# Patient Record
Sex: Female | Born: 1945 | Race: White | Hispanic: No | Marital: Married | Smoking: Former smoker
Health system: Southern US, Community
[De-identification: ages and names within clinical notes are randomized; demographics above are authoritative.]

## PROBLEM LIST (undated history)

## (undated) DIAGNOSIS — C801 Malignant (primary) neoplasm, unspecified: Secondary | ICD-10-CM

## (undated) DIAGNOSIS — F32A Depression, unspecified: Secondary | ICD-10-CM

## (undated) DIAGNOSIS — I2510 Atherosclerotic heart disease of native coronary artery without angina pectoris: Secondary | ICD-10-CM

## (undated) DIAGNOSIS — I10 Essential (primary) hypertension: Secondary | ICD-10-CM

## (undated) DIAGNOSIS — F329 Major depressive disorder, single episode, unspecified: Secondary | ICD-10-CM

## (undated) DIAGNOSIS — M810 Age-related osteoporosis without current pathological fracture: Secondary | ICD-10-CM

## (undated) DIAGNOSIS — I509 Heart failure, unspecified: Secondary | ICD-10-CM

## (undated) DIAGNOSIS — E785 Hyperlipidemia, unspecified: Secondary | ICD-10-CM

## (undated) DIAGNOSIS — E119 Type 2 diabetes mellitus without complications: Secondary | ICD-10-CM

## (undated) DIAGNOSIS — I219 Acute myocardial infarction, unspecified: Secondary | ICD-10-CM

## (undated) DIAGNOSIS — J329 Chronic sinusitis, unspecified: Secondary | ICD-10-CM

## (undated) DIAGNOSIS — C3490 Malignant neoplasm of unspecified part of unspecified bronchus or lung: Secondary | ICD-10-CM

## (undated) DIAGNOSIS — R569 Unspecified convulsions: Secondary | ICD-10-CM

## (undated) HISTORY — DX: Atherosclerotic heart disease of native coronary artery without angina pectoris: I25.10

## (undated) HISTORY — PX: ABDOMINAL HYSTERECTOMY: SHX81

## (undated) HISTORY — DX: Age-related osteoporosis without current pathological fracture: M81.0

## (undated) HISTORY — DX: Essential (primary) hypertension: I10

## (undated) HISTORY — DX: Unspecified convulsions: R56.9

## (undated) HISTORY — PX: UMBILICAL HERNIA REPAIR: SHX196

## (undated) HISTORY — PX: APPENDECTOMY: SHX54

## (undated) HISTORY — DX: Hyperlipidemia, unspecified: E78.5

## (undated) HISTORY — PX: TUBAL LIGATION: SHX77

## (undated) HISTORY — DX: Type 2 diabetes mellitus without complications: E11.9

## (undated) HISTORY — DX: Major depressive disorder, single episode, unspecified: F32.9

## (undated) HISTORY — DX: Malignant (primary) neoplasm, unspecified: C80.1

## (undated) HISTORY — PX: CARPAL TUNNEL RELEASE: SHX101

## (undated) HISTORY — DX: Depression, unspecified: F32.A

## (undated) HISTORY — DX: Chronic sinusitis, unspecified: J32.9

## (undated) HISTORY — PX: OTHER SURGICAL HISTORY: SHX169

## (undated) MED FILL — Dexamethasone Sodium Phosphate Inj 100 MG/10ML: INTRAMUSCULAR | Qty: 1 | Status: AC

---

## 1999-08-05 ENCOUNTER — Encounter: Admission: RE | Admit: 1999-08-05 | Discharge: 1999-08-05 | Payer: Self-pay | Admitting: Obstetrics and Gynecology

## 1999-08-05 ENCOUNTER — Encounter: Payer: Self-pay | Admitting: Obstetrics and Gynecology

## 2000-08-29 ENCOUNTER — Encounter: Admission: RE | Admit: 2000-08-29 | Discharge: 2000-08-29 | Payer: Self-pay | Admitting: Internal Medicine

## 2000-08-29 ENCOUNTER — Encounter: Payer: Self-pay | Admitting: Internal Medicine

## 2000-09-05 ENCOUNTER — Encounter: Payer: Self-pay | Admitting: Internal Medicine

## 2000-09-05 ENCOUNTER — Encounter: Admission: RE | Admit: 2000-09-05 | Discharge: 2000-09-05 | Payer: Self-pay | Admitting: Internal Medicine

## 2002-10-02 ENCOUNTER — Encounter: Payer: Self-pay | Admitting: Emergency Medicine

## 2002-10-02 ENCOUNTER — Inpatient Hospital Stay (HOSPITAL_COMMUNITY): Admission: EM | Admit: 2002-10-02 | Discharge: 2002-10-10 | Payer: Self-pay | Admitting: Emergency Medicine

## 2002-10-02 ENCOUNTER — Encounter (INDEPENDENT_AMBULATORY_CARE_PROVIDER_SITE_OTHER): Payer: Self-pay | Admitting: *Deleted

## 2002-10-02 ENCOUNTER — Encounter: Payer: Self-pay | Admitting: Pulmonary Disease

## 2002-10-02 HISTORY — PX: CORONARY ANGIOPLASTY WITH STENT PLACEMENT: SHX49

## 2002-10-03 ENCOUNTER — Encounter: Payer: Self-pay | Admitting: Pulmonary Disease

## 2002-10-04 ENCOUNTER — Encounter: Payer: Self-pay | Admitting: *Deleted

## 2002-10-06 ENCOUNTER — Encounter: Payer: Self-pay | Admitting: *Deleted

## 2002-10-08 ENCOUNTER — Encounter (INDEPENDENT_AMBULATORY_CARE_PROVIDER_SITE_OTHER): Payer: Self-pay | Admitting: *Deleted

## 2002-11-12 ENCOUNTER — Encounter (HOSPITAL_COMMUNITY): Admission: RE | Admit: 2002-11-12 | Discharge: 2003-02-10 | Payer: Self-pay | Admitting: Cardiovascular Disease

## 2005-12-17 ENCOUNTER — Emergency Department (HOSPITAL_COMMUNITY): Admission: EM | Admit: 2005-12-17 | Discharge: 2005-12-17 | Payer: Self-pay | Admitting: Emergency Medicine

## 2006-09-13 ENCOUNTER — Encounter: Admission: RE | Admit: 2006-09-13 | Discharge: 2006-09-13 | Payer: Self-pay | Admitting: *Deleted

## 2006-09-22 ENCOUNTER — Ambulatory Visit (HOSPITAL_COMMUNITY): Admission: RE | Admit: 2006-09-22 | Discharge: 2006-09-22 | Payer: Self-pay | Admitting: *Deleted

## 2006-09-22 ENCOUNTER — Encounter (INDEPENDENT_AMBULATORY_CARE_PROVIDER_SITE_OTHER): Payer: Self-pay | Admitting: Specialist

## 2007-02-16 ENCOUNTER — Encounter: Admission: RE | Admit: 2007-02-16 | Discharge: 2007-02-16 | Payer: Self-pay | Admitting: *Deleted

## 2009-04-15 ENCOUNTER — Other Ambulatory Visit: Admission: RE | Admit: 2009-04-15 | Discharge: 2009-04-15 | Payer: Self-pay | Admitting: Internal Medicine

## 2009-05-23 ENCOUNTER — Encounter: Admission: RE | Admit: 2009-05-23 | Discharge: 2009-05-23 | Payer: Self-pay | Admitting: General Surgery

## 2010-09-30 ENCOUNTER — Emergency Department (HOSPITAL_COMMUNITY)
Admission: EM | Admit: 2010-09-30 | Discharge: 2010-09-30 | Payer: Self-pay | Source: Home / Self Care | Admitting: Emergency Medicine

## 2011-02-12 ENCOUNTER — Emergency Department (HOSPITAL_COMMUNITY)
Admission: EM | Admit: 2011-02-12 | Discharge: 2011-02-12 | Disposition: A | Discharge status: Home / Self Care | Payer: Worker's Compensation | Attending: Emergency Medicine | Admitting: Emergency Medicine

## 2011-02-12 DIAGNOSIS — I252 Old myocardial infarction: Secondary | ICD-10-CM | POA: Insufficient documentation

## 2011-02-12 DIAGNOSIS — Y9289 Other specified places as the place of occurrence of the external cause: Secondary | ICD-10-CM | POA: Insufficient documentation

## 2011-02-12 DIAGNOSIS — I509 Heart failure, unspecified: Secondary | ICD-10-CM | POA: Insufficient documentation

## 2011-02-12 DIAGNOSIS — S61409A Unspecified open wound of unspecified hand, initial encounter: Secondary | ICD-10-CM | POA: Insufficient documentation

## 2011-02-12 DIAGNOSIS — I2510 Atherosclerotic heart disease of native coronary artery without angina pectoris: Secondary | ICD-10-CM | POA: Insufficient documentation

## 2011-02-12 DIAGNOSIS — W269XXA Contact with unspecified sharp object(s), initial encounter: Secondary | ICD-10-CM | POA: Insufficient documentation

## 2011-02-12 DIAGNOSIS — E119 Type 2 diabetes mellitus without complications: Secondary | ICD-10-CM | POA: Insufficient documentation

## 2011-02-12 DIAGNOSIS — M79609 Pain in unspecified limb: Secondary | ICD-10-CM | POA: Insufficient documentation

## 2011-02-15 ENCOUNTER — Emergency Department (HOSPITAL_COMMUNITY)
Admission: EM | Admit: 2011-02-15 | Discharge: 2011-02-15 | Discharge status: Against Medical Advice | Payer: Worker's Compensation | Attending: Emergency Medicine | Admitting: Emergency Medicine

## 2011-02-15 DIAGNOSIS — Z0389 Encounter for observation for other suspected diseases and conditions ruled out: Secondary | ICD-10-CM | POA: Insufficient documentation

## 2011-02-26 NOTE — Op Note (Signed)
[name], [name]               ACCOUNT NO.:  [id number]   MEDICAL RECORD NO.:  [id number]          PATIENT TYPE:  AMB   LOCATION:  ENDO                         FACILITY:  [facility]   PHYSICIAN:  Georgiana Spinner, M.D.    DATE OF BIRTH:  10-08-1946   DATE OF PROCEDURE:  09/22/2006  DATE OF DISCHARGE:                               OPERATIVE REPORT   PROCEDURE:  Upper endoscopy.   INDICATIONS:  Abdominal pain.   ANESTHESIA:  Fentanyl 75 mcg, Versed 6 mg.   DESCRIPTION OF PROCEDURE:  With the patient mildly sedated in the left  lateral decubitus position, the Olympus videoscopic endoscope was  inserted in the mouth and passed under direct vision through the  esophagus which appeared normal with a small hiatal hernia which was  photographed from above.  We entered into the stomach.  The fundus and  body appeared normal.  The antrum showed thickened folds in the  prepyloric area that were photographed and biopsied.  We entered into  the duodenal bulb and second portion of the duodenum, both appeared  normal.  From this point, the endoscope was slowly withdrawn taking  circumferential views of duodenal mucosa until the endoscope had been  pulled back in the stomach and placed in retroflexion to view the  stomach from below. The endoscope was straightened and withdrawn taking  circumferential views of the remaining gastric and esophageal mucosa.  The patient's vital signs and pulse oximeter remained stable.  She  tolerated the procedure well without apparent complications.   FINDINGS:  Thickening gastric antral fold, await biopsy report.  The  patient will call me for results and follow-up with me as an outpatient  to review ultrasound and other laboratory studies, as well.           ______________________________  Georgiana Spinner, M.D.     GMO/MEDQ  D:  09/22/2006  T:  09/22/2006  Job:  [id number]

## 2011-02-26 NOTE — Cardiovascular Report (Signed)
NAME:  Monica [name], Monica [name]                         ACCOUNT NO.:  [id number]   MEDICAL RECORD NO.:  [id number]                   PATIENT TYPE:  INP   LOCATION:  1827                                 FACILITY:  [facility]   PHYSICIAN:  Nanetta Batty, M.D.                DATE OF BIRTH:  Feb 04, 1946   DATE OF PROCEDURE:  10/02/2002  DATE OF DISCHARGE:                              CARDIAC CATHETERIZATION   PROCEDURE PERFORMED:  Cardiac catheterization.   INDICATION:  The patient is a 65 year old white female who presented to  [facility] ER at 4 a.m. with chest pain.  She was found to be short of  breath and her chest x-ray ultimately showed congestive heart failure and  pulmonary edema.  Her enzymes came back positive and her ECG showed anterior  ST segment elevation. Because of this, she presented urgently to the  catheterization lab for catheterization and potential intervention.   DESCRIPTION OF PROCEDURE:  The patient was brought to the second floor [facility] urgently in cardiogenic shock.  Her  right groin was prepped and shaved in the usual sterile fashion.  Xylocaine  1% was used for local anesthesia.  A 7 French sheath was inserted into the  right femoral artery using standard Seldinger technique. An 8 French sheath  was inserted into the right femoral vein. The 6 French right and left  Judkins diagnostic catheters as well as a pigtail catheter were used for  selective coronary angiography, left ventriculography, supravalvular  aortography and distal abdominal aortography. Omnipaque dye was used for the  entirety of the case.  Retrograde, aortic, left ventricular and pullback  pressures were recorded.   HEMODYNAMICS:  1. Aortic systolic pressure 86, diastolic pressure 61.  2. Left ventricular systolic pressure 85, end-diastolic pressure 28.  3. RA pressure was 14, RV pressure was 47/12, PA pressure was 46/24 and A     wave 31, P wave 33, mean  27.   SELECTIVE CORONARY ANGIOGRAPHY:  1. Left main:  Normal.  2. LAD:  The LAD gave off a first and large second diagonal branch.  There     was continuation of the LAD after the diagonal branch that reached the     apex. There was a 60% hazy stenosis just after the second diagonal     takeoff.  3. Left circumflex:  Nondominant with 40% segmental stenosis.  4. Right coronary artery:  Dominant with anterior takeoff and 40% segmental     stenosis.   LEFT VENTRICULOGRAPHY:  The RAO left ventriculogram was performed using 20  cc of Omnipaque dye at 10 cc/sec.  The overall LVEF was estimated at  approximately 35% with severe anterior and mid inferior hypokinesia.   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  There  is no AI noted.  DISTAL ABDOMINAL AORTOGRAPHY:  This was performed using 20 cc of Omnipaque  dye at 20 cc/sec.  This was performed to introduce an intra-aortic balloon  pump atraumatically. The renal arteries are widely patent. Infrarenal  abdominal aorta had mild atherosclerotic changes but not with non aneurysmal  iliac bifurcation was free of significant disease.   IMPRESSION:  The patient has a left ventricular dysfunction out of  proportion of her coronary artery disease.  I suspect she has two paucities  going on; one a cardiomyopathy, possibly myocarditis, the second anterior  wall myocardial infarction.  I am going to perform intravascular ultrasound-  guided percutaneous coronary intervention from the left anterior descending.   PERCUTANEOUS CORONARY INTERVENTION:  Using a 7 [place] JL4 guide catheter  along with an 0.014, 190 Sport guide wire, IVUS was performed using the  Galaxy II IVUS machine.  This showed occlusive plaque in the mid LAD up to  the bifurcation of the diagonal branch.  Prior to this, the patient received  3000 units of heparin with an ACT of 274.  She  received four baby aspirin  chewable and 300 mg of p.o. Plavix followed by Integrilin double bolus and  infusion.   PTA was performed of the mid LAD with a 2.0 x 20 Maverick.  There was some  spasm of the LAD beyond this. A 2.5 x 23 Cypher stent was then deployed at  12 atmospheres and Low pressure  inflations were performed distal to this  because of no reflow resulting in TIMI-3 flow of the LAD.   The patient was stating at 80% and had a pO2 of 45 on 10 liters.  Because of  this she was urgently intubated which resulted in markedly improved  saturations and blood pressure.   Because of her mitral regurgitation and her low blood pressure in the  setting of an acute myocardial infarction, an intra-aortic balloon pump was  placed via the right femoral artery with excellent augmented pressure.  Because of this, the patient was begun on IV nitroglycerin.  A Foley was  inserted and the patient received 80 mg of IV Lasix as well as Versed and  Pavulon.   OVERALL IMPRESSION:  Successful intravascular ultrasound-guided percutaneous  coronary intervention using glycoprotein IIb/IIIa inhibition of the mid left  anterior descending.  The patient's pressure and saturations markedly  improved after intubation and insertion of intra-aortic balloon pump.   Plans will be for continued artificial ventilation and intra-aortic balloon  pump to unload her ventricle and decrease her MR.  Her clinical situation  still remains guarded.                                                   Nanetta Batty, M.D.    Cordelia Pen  D:  10/02/2002  T:  10/04/2002  Job:  [id number]   cc:   Cardiac Catheterization Laboratory   Dani Gobble, M.D.  [address]  [place]  [place] [address]  Fax: [phone]

## 2011-02-26 NOTE — H&P (Signed)
NAME:  Monica [name], Monica [name]                         ACCOUNT NO.:  [id number]   MEDICAL RECORD NO.:  [id number]                   PATIENT TYPE:  INP   LOCATION:  2926                                 FACILITY:  [facility]   PHYSICIAN:  Nanetta Batty, M.D.                DATE OF BIRTH:  1946/08/05   DATE OF ADMISSION:  10/02/2002  DATE OF DISCHARGE:                                HISTORY & PHYSICAL   CHIEF COMPLAINT:  Chest pain and shortness of breath.   HISTORY OF PRESENT ILLNESS:  The patient is a 65 year old female with no  prior history of coronary disease or myocardial infarction who was admitted  to the emergency room  with increasing shortness of breath, chest pain and  orthopnea.  She complained of sinusitis for one week.  Yesterday, she was  put on Augmentin and after her first dose she developed substernal chest  pain with some diaphoresis.  Yesterday evening, she had increasing shortness  of breath  and  orthopnea and presented to the emergency room.  In the  emergency room, her EKG is abnormal with anterior ST elevation and elevated  CK-MB and troponin.  She is admitted now for  further evaluation.   PAST MEDICAL HISTORY:  Remarkable for hypertension, but no treatment.  She  has no history of diabetes.   CURRENT MEDICATIONS:  1. Premarin 0.625 q.d.  2. Effexor.   ALLERGIES:  KEFLEX.   SOCIAL HISTORY:  She is married with three children, one grandchild.  She is  a one to two pack a day smoker for 36 years. She works at the [employer]  on [address] in [place].   FAMILY HISTORY:  Her father died in his 46s of an myocardial infarction.  Mother died at 69.   REVIEW OF SYSTEMS:  Essentially unremarkable except for noted above.   PHYSICAL EXAMINATION:  VITAL SIGNS:  Blood pressure 100/56, pulse 96,  respirations 20, O2 saturation is 91% on two liters.  GENERAL:  She is a well-developed, well-nourished female in no acute  distress.  HEENT:  Normocephalic.  She does wear  glasses.  Extraocular movements are  intact.  Sclerae nonicteric.  NECK:  Without bruits.  She does have elevated jugular venous distention.  CHEST:  Reveals rales half way up bilaterally.  CARDIAC:  Regular rate and rhythm without murmur, rub or gallop.  Normal S1,  S2.  ABDOMEN:  Nontender.  No hepatosplenomegaly.  EXTREMITIES:  Without edema. Distal pulses are diminished.  There are no  femoral bruits noted.  NEURO:  Grossly intact.   LABORATORY DATA:  Sodium 125, potassium 4.0, BUN 11, creatinine 0.5, BNP  295.  Chest x-ray shows cardiomegaly and congestive heart failure and  pneumonitis.  White count 15.4, hematoglobin 13.7, hematocrit 39.3,  platelets 259.  CK is 533, MB 83.4.  Troponin 6.18.  D-dimer 0.42.  BNP 295.   IMPRESSION:  1.  Acute myocardial infarction.  2. Congestive failure.  3. Upper respiratory infection.  4. History of sinusitis.  5. Smoking.  6. Family history of coronary disease.   PLAN:  The patient was seen by Dr. Domingo Sep and myself and admitted and  started on heparin and taken to the cath  lab urgently by Dr. Allyson Sabal.  EKG  did reveal ST elevation in V2 and V3.      Abelino Derrick, P.A.                      Nanetta Batty, M.D.    Lenard Lance  D:  10/03/2002  T:  10/04/2002  Job:  [id number]   cc:   Janae Bridgeman. Eloise Harman., M.D.  [address]  [place]  [place] [address]  Fax: [phone]

## 2011-02-26 NOTE — Discharge Summary (Signed)
NAME:  ARMANDINA, Monica                         ACCOUNT NO.:  [id number]   MEDICAL RECORD NO.:  [id number]                   PATIENT TYPE:  INP   LOCATION:  2007                                 FACILITY:  [facility]   PHYSICIAN:  Nanetta Batty, M.D.                DATE OF BIRTH:  02/22/1946   DATE OF ADMISSION:  10/02/2002  DATE OF DISCHARGE:  10/10/2002                                 DISCHARGE SUMMARY   HOSPITAL COURSE:  The patient is a 65 year old white female patient with no  prior history of coronary artery disease or MI.  She was admitted to the ER  with increased shortness of breath, chest pain, and orthopnea.  She  apparently had sinusitis about two weeks being treated with antibiotics.  However, she developed some PND and chest pain rated a 6/10.  Her EKG showed  ST elevation anteriorly and her CK and troponins were positive.  She was  seen by Dr. Domingo Sep who felt that she was in cardiogenic shock and it was  decided to take her to the catheterization laboratory urgently on October 02, 2002.  This was performed by Dr. Nanetta Batty.  Catheterization was  done.  It showed a 60% in her LAD.  She underwent IV ultrasound of that and  it showed occlusive plaque back up to her diagonal.  Thus, she developed  some respiratory distress during the procedure and she had to be intubated.  A Cypher stent was placed to her LAD.  She also needed an IAPB with a blood  pressure in the 90s.  She had 2-3+ MR.  She stabilized after she was put on  a respirator and also the IAPB was placed.  It recommended to keep this for  48 hours.  Pulmonary consult was called for ventilation management.  She was  seen on October 02, 2002 by Dr. Sung Amabile.  On October 03, 2002 her blood  pressure was alternating from 100 to 110 systolic.  She was given some IV  Lasix for diuresis.  She had been started on Coreg.  She was getting  breathing treatments.  She was still intubated.  On October 04, 2002 her  ICB  was changed to 1 to 2.  She continued to get some IV Lasix for CHF.  Ventilator was continued.  She was then weaned to 1 to 3 on the IAPB on  October 05, 2002 with a final discontinuation of the IAPB on October 06, 2002.  She also was weaned off the ventilator on October 06, 2002.  Chest x-  ray showed decreased edema on December 28.  She was stable.  Her blood  pressure was up to 105/45.  She continued to have some diuresis.  Her  cardiac medications were titrated upward.  A 2-D echo was done on October 08, 2002 which showed some increase in her ejection fraction; it went from  previous 20-30%, it was not 35-45%.  Anterior walls appeared hypokinetic as  well as inferior wall.  Thus she had already had some recovery of her EF.  On October 10, 2002 it was decided that she was stable on her medications  and she could be discharged home with continued outpatient titration of her  cardiac medications.   LABORATORY DATA:  On October 10, 2002, hemoglobin 13.9, hematocrit 40.7,  platelets 313, wbc's 6.9.  ESR was 99 on December 26.  Sodium 130, potassium  4.0, BUN 9, creatinine 0.5  CK-MB #1 533/83, troponin 6.18; #2 372/49; #3  486/41; #4 461/33.  BNP on December 23 is 295; on the night of December 24  it was 609.  TSH 0.813.  Influenza A and B was negative.  AST 49, ALT 39.  Chest x-ray on October 02, 2002 showed diffuse pulmonary edema; December 24  - slightly less pulmonary edema; December 26 - no change; December 27 -  decreasing pulmonary edema.   DISCHARGE MEDICATIONS:  1. Plavix 75 mg once per day x3 months.  2. Protonix 40 mg once per day or Prevacid.  3. Coreg 3.125 mg once per day.  4. Augmentin 250 mg three times per day x2 more days.  5. Aspirin 81 mg once per day.  6. Altace 2.5 mg once per day.  7. Zocor 20 mg at bedtime every night.  8. Digoxin 0.125 mg once per day.  9. Nitroglycerin 1/150 one under tongue.  10.      She may take her Zyrtec, Flonase, Effexor.  11.       She is not to take her Premarin, lisinopril, or     hydrochlorothiazide.   ACTIVITY:  She should do no strenuous activity, housecleaning, lifting, or  driving until seen back in the office.   DIET:  She should be on a low saturated fat diet.   FOLLOW-UP:  She will follow-up with Dr. Allyson Sabal on October 17, 2002 at 11 a.m.   DISCHARGE DIAGNOSES:  1. Acute anterior wall myocardial infarction with concomitant cardiogenic     shock and urgent cardiac catheterization.  2. Status post catheterization with Cypher stent placed to her left anterior     descending coronary artery.  She also had a residual disease with a 40%     mid right coronary artery and 40% mid circumflex.  Ejection fraction was     20-30% at time of catheterization.  3. Ischemic cardiomyopathy with some recovery status post 2-D echocardiogram     on October 08, 2002 showing ejection fraction of 35-45%.  4. Hypertension.  5. Dyslipidemia.  6. Tobacco.  7. Congestive heart failure.  8. History of family history of coronary artery disease.  9. Sinusitis and upper respiratory infection.     Lezlie Octave, N.P.                        Nanetta Batty, M.D.    BB/MEDQ  D:  11/26/2002  T:  11/26/2002  Job:  [id number]   cc:   Janae Bridgeman. Eloise Harman., M.D.  [address]  [place]  [place] [address]  Fax: [phone]

## 2011-02-26 NOTE — Op Note (Signed)
NAMEPENDA, Monica [name]               ACCOUNT NO.:  [id number]   MEDICAL RECORD NO.:  [id number]          PATIENT TYPE:  AMB   LOCATION:  ENDO                         FACILITY:  [facility]   PHYSICIAN:  Georgiana Spinner, M.D.    DATE OF BIRTH:  May 23, 1946   DATE OF PROCEDURE:  09/22/2006  DATE OF DISCHARGE:                               OPERATIVE REPORT   PROCEDURE:  Colonoscopy.   INDICATIONS:  Colon polyps.   ANESTHESIA:  Fentanyl 25 mcg, Versed 4 mg.   DESCRIPTION OF PROCEDURE:  With the patient mildly sedated in the left  lateral decubitus position, the Pentax videoscopic colonoscope was  inserted into the rectum and passed under direct vision to the cecum  identified by the ileocecal valve and appendiceal orifice, both of which  were photographed. From this point, the colonoscope was slowly withdrawn  taking circumferential views of the colonic mucosa stopping at  approximately 20 cm from the anal verge at which point we encountered  two small polyps which were photographed and removed using hot biopsy  forceps technique at a setting of 20/200 blended current.  We withdrew  all the way into the rectum which appeared normal on indirect and  retroflexed view. The endoscope was straightened and withdrawn.  The  patient's vital signs and pulse oximeter remained stable.  The patient  tolerated the procedure well without apparent complications.   FINDINGS:  Two small polyps at 20 cm from anal verge, otherwise,  unremarkable examination.   PLAN:  Await biopsy report.  The patient will call me for results and  follow-up with me as an outpatient.  See endoscopy note for further  details of follow-up.           ______________________________  Georgiana Spinner, M.D.     GMO/MEDQ  D:  09/22/2006  T:  09/22/2006  Job:  [id number]

## 2011-06-17 ENCOUNTER — Other Ambulatory Visit: Payer: Self-pay | Admitting: Internal Medicine

## 2011-06-17 DIAGNOSIS — R945 Abnormal results of liver function studies: Secondary | ICD-10-CM

## 2011-06-21 ENCOUNTER — Ambulatory Visit
Admission: RE | Admit: 2011-06-21 | Discharge: 2011-06-21 | Disposition: A | Discharge status: Home / Self Care | Payer: Worker's Compensation | Source: Ambulatory Visit | Attending: Internal Medicine | Admitting: Internal Medicine

## 2011-06-21 DIAGNOSIS — R945 Abnormal results of liver function studies: Secondary | ICD-10-CM

## 2015-03-04 ENCOUNTER — Telehealth: Payer: Self-pay | Admitting: Cardiology

## 2015-03-04 NOTE — Telephone Encounter (Signed)
Received records from [facility] for appointment on 03/12/15 with Dr Percival Spanish.  Records given to [facility] (medical records) for Dr Hochrein's schedule on 03/12/15. lp

## 2015-03-12 ENCOUNTER — Ambulatory Visit (INDEPENDENT_AMBULATORY_CARE_PROVIDER_SITE_OTHER): Payer: Medicare Other | Admitting: Cardiology

## 2015-03-12 ENCOUNTER — Encounter: Payer: Self-pay | Admitting: Cardiology

## 2015-03-12 VITALS — BP 132/78 | HR 56 | Ht 65.0 in | Wt 125.0 lb

## 2015-03-12 DIAGNOSIS — I2510 Atherosclerotic heart disease of native coronary artery without angina pectoris: Secondary | ICD-10-CM | POA: Diagnosis not present

## 2015-03-12 NOTE — Patient Instructions (Signed)
Your physician recommends that you schedule a follow-up appointment in: one year with Dr. Percival Spanish    Stop taking  Zetia and fenofibrate  We are ordering a stress test and an Echo for you to get done

## 2015-03-12 NOTE — Progress Notes (Signed)
Cardiology Office Note   Date:  03/12/2015   ID:  [name], DOB 1946/09/23, MRN [id number]  PCP:  Jani Gravel, MD  Cardiologist:   Minus Breeding, MD   Chief Complaint  Patient presents with  . Coronary Artery Disease      History of Present Illness: Monica [name] is a 69 y.o. female who presents for evaluation of coronary artery disease. She had previous stenting to an LAD. This was in 2003. She had a cipher stent. Her EF was 35% at that time and she required treatment with ventilation and intra-aortic balloon pump.  She hasn't been seen in follow-up for about 6 years. She's actually gotten along well but was sent back here for routine follow-up. The patient denies any new symptoms such as chest discomfort, neck or arm discomfort. There has been no new shortness of breath, PND or orthopnea. There have been no reported palpitations, presyncope or syncope.  She doesn't exercise but she does activity such as vacuuming. With this she brings on no cardiovascular symptoms.   Past Medical History  Diagnosis Date  . Hyperlipidemia   . Hypertension   . Diabetes mellitus   . Sinusitis   . Osteoporosis   . CAD (coronary artery disease)     Cypher stent 09/2002    Past Surgical History  Procedure Laterality Date  . Umbilical hernia repair    . Tubal ligation    . Exploratory abdominal      Bleeding in abdomen after tubal  . Carpal tunnel release Bilateral      Current Outpatient Prescriptions  Medication Sig Dispense Refill  . aspirin 81 MG tablet Take 81 mg by mouth daily.    Marland Kitchen atorvastatin (LIPITOR) 10 MG tablet Take 40 mg by mouth daily.    . Calcium Carbonate-Vitamin D 600-400 MG-UNIT per tablet Take 1 tablet by mouth 2 (two) times daily.    . carvedilol (COREG) 3.125 MG tablet Take 3.125 mg by mouth 2 (two) times daily with a meal.    . halobetasol (ULTRAVATE) 0.05 % cream Apply topically daily.    . Insulin Detemir (LEVEMIR) 100 UNIT/ML Pen Inject 16 Units into  the skin daily at 10 pm.    . losartan (COZAAR) 50 MG tablet Take 50 mg by mouth daily.    . metFORMIN (GLUCOPHAGE) 500 MG tablet Take by mouth 2 (two) times daily with a meal.    . Multiple Vitamin (MULTIVITAMIN) tablet Take 1 tablet by mouth daily.    . Omega-3 Fatty Acids (FISH OIL) 1000 MG CAPS Take 1,000 mg by mouth daily.    Marland Kitchen omeprazole (PRILOSEC) 20 MG capsule Take 20 mg by mouth daily.    . pioglitazone (ACTOS) 15 MG tablet Take 15 mg by mouth daily.     No current facility-administered medications for this visit.    Allergies:   Ace inhibitors; Altace; Keflex; Phenobarbital; and Sulfate    Social History:  The patient  reports that she has quit smoking. Her smoking use included Cigarettes. She has a 60 pack-year smoking history. She uses smokeless tobacco. She reports that she does not drink alcohol.   Family History:  The patient's family history includes Dementia in her mother; Heart attack (age of onset: 58) in her father; Heart attack (age of onset: 45) in her brother and brother; Kidney disease in her mother.    ROS:  Please see the history of present illness.   Otherwise, review of systems are positive for  none.   All other systems are reviewed and negative.    PHYSICAL EXAM: VS:  BP 132/78 mmHg  Pulse 56  Ht '5\' 5"'$  (1.651 m)  Wt 125 lb (56.7 kg)  BMI 20.80 kg/m2 , BMI Body mass index is 20.8 kg/(m^2). GENERAL:  Well appearing HEENT:  Pupils equal round and reactive, fundi not visualized, oral mucosa unremarkable NECK:  No jugular venous distention, waveform within normal limits, carotid upstroke brisk and symmetric, no bruits, no thyromegaly LYMPHATICS:  No cervical, inguinal adenopathy LUNGS:  Clear to auscultation bilaterally BACK:  No CVA tenderness CHEST:  Unremarkable HEART:  PMI not displaced or sustained,S1 and S2 within normal limits, no S3, no S4, no clicks, no rubs, no murmurs ABD:  Flat, positive bowel sounds normal in frequency in pitch, no bruits, no  rebound, no guarding, no midline pulsatile mass, no hepatomegaly, no splenomegaly EXT:  2 plus pulses throughout, no edema, no cyanosis no clubbing SKIN:  No rashes no nodules NEURO:  Cranial nerves II through XII grossly intact, motor grossly intact throughout PSYCH:  Cognitively intact, oriented to person place and time    EKG:  EKG is ordered today. The ekg ordered today demonstrates  Sinus rhythm, rate 56, axis within normal limits, intervals within normal limits, no acute ST-T wave changes.    Recent Labs: No results found for requested labs within last 365 days.    Lipid Panel LDL 9, HDL 28 triglycerides 228 A1c 6.3    Wt Readings from Last 3 Encounters:  03/12/15 125 lb (56.7 kg)      Other studies Reviewed: Additional studies/ records that were reviewed today include: Primary office records including labs. Review of the above records demonstrates:  Please see elsewhere in the note.     ASSESSMENT AND PLAN:  CAD:  The patient has no new symptoms. I would like to screen her with a POET (Plain Old Exercise Treadmill).  She should continue with aggressive secondary risk reduction.  DYSLIPIDEMIA:  Recent FDA guidelines have removed any approval for combination fenofibric acid and statin when used in combination to treat low HDL or hypertriglyceridemia without another indication such as pancreatitis.  Therefore, I will stop the Tricor.  In addition her LDL does not need to be as low as it is so I don't think there is any indication for the Zetia and I have taken the liberty of stopping this.   Current medicines are reviewed at length with the patient today.  The patient does not have concerns regarding medicines.  The following changes have been made:  As above  Labs/ tests ordered today include:   Orders Placed This Encounter  Procedures  . Exercise Tolerance Test  . EKG 12-Lead  . ECHOCARDIOGRAM COMPLETE     Disposition:   FU with me in one year.      Signed, Minus Breeding, MD  03/12/2015 11:21 AM    [facility]

## 2015-03-24 ENCOUNTER — Encounter: Payer: Self-pay | Admitting: Cardiology

## 2015-04-09 ENCOUNTER — Telehealth (HOSPITAL_COMMUNITY): Payer: Self-pay

## 2015-04-09 NOTE — Telephone Encounter (Signed)
Encounter complete. 

## 2015-04-11 ENCOUNTER — Ambulatory Visit (HOSPITAL_BASED_OUTPATIENT_CLINIC_OR_DEPARTMENT_OTHER)
Admission: RE | Admit: 2015-04-11 | Discharge: 2015-04-11 | Disposition: A | Discharge status: Home / Self Care | Payer: Medicare Other | Source: Ambulatory Visit | Attending: Cardiology | Admitting: Cardiology

## 2015-04-11 ENCOUNTER — Ambulatory Visit (HOSPITAL_COMMUNITY)
Admission: RE | Admit: 2015-04-11 | Discharge: 2015-04-11 | Disposition: A | Discharge status: Home / Self Care | Payer: Medicare Other | Source: Ambulatory Visit | Attending: Cardiology | Admitting: Cardiology

## 2015-04-11 ENCOUNTER — Encounter (HOSPITAL_COMMUNITY): Payer: Self-pay | Admitting: *Deleted

## 2015-04-11 DIAGNOSIS — I358 Other nonrheumatic aortic valve disorders: Secondary | ICD-10-CM | POA: Insufficient documentation

## 2015-04-11 DIAGNOSIS — I429 Cardiomyopathy, unspecified: Secondary | ICD-10-CM | POA: Diagnosis present

## 2015-04-11 DIAGNOSIS — I340 Nonrheumatic mitral (valve) insufficiency: Secondary | ICD-10-CM | POA: Diagnosis not present

## 2015-04-11 DIAGNOSIS — I2510 Atherosclerotic heart disease of native coronary artery without angina pectoris: Secondary | ICD-10-CM

## 2015-04-11 DIAGNOSIS — I351 Nonrheumatic aortic (valve) insufficiency: Secondary | ICD-10-CM | POA: Diagnosis not present

## 2015-04-11 LAB — EXERCISE TOLERANCE TEST
CHL CUP MPHR: 152 {beats}/min
CHL RATE OF PERCEIVED EXERTION: 16
CSEPED: 8 min
CSEPEDS: 36 s
CSEPEW: 10.1 METS
Peak HR: 122 {beats}/min
Percent HR: 80 %
Rest HR: 57 {beats}/min

## 2015-04-11 NOTE — Progress Notes (Unsigned)
Pt. Stated she was not instructed to hold her Carvedilol, therefore, she was unable to attain THR

## 2015-04-28 ENCOUNTER — Other Ambulatory Visit: Payer: Self-pay | Admitting: Gastroenterology

## 2015-05-27 ENCOUNTER — Telehealth: Payer: Self-pay | Admitting: *Deleted

## 2015-05-27 ENCOUNTER — Encounter (HOSPITAL_COMMUNITY): Payer: Self-pay | Admitting: *Deleted

## 2015-05-27 NOTE — Telephone Encounter (Signed)
[facility]/Dr. Carol Ada requests clearance for:   1. Type of surgery: colonoscopy 2. Date of surgery: 05/30/2015 3. Surgeon: Dr. Benson Norway 4. Medications that need to be held & how long: none specified 5. Fax and/or Phone: (p) [phone]  (f) [phone]

## 2015-05-28 NOTE — Telephone Encounter (Signed)
Encounter routed to Dr. Benson Norway

## 2015-05-28 NOTE — Telephone Encounter (Signed)
The patient can undergo a colonoscopy.

## 2015-05-29 ENCOUNTER — Telehealth: Payer: Self-pay | Admitting: Cardiology

## 2015-05-29 NOTE — H&P (Signed)
  Monica [name] HPI: The patient was evaluated by Dr. Percival Spanish and she underwent testing on 04/11/2015. Her echo revealed an EF of 60-65% and her stress test was negative for anginal complaints or ST changes, however, she was on her beta blocker. Her target heart rate was not reached.  Past Medical History  Diagnosis Date  . Hyperlipidemia   . Hypertension   . Diabetes mellitus   . Sinusitis   . Osteoporosis   . CAD (coronary artery disease)     Cypher stent 09/2002  . CHF (congestive heart failure)   . 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 Umbilical hernia repair    . Tubal ligation    . Exploratory abdominal      Bleeding in abdomen after tubal  . Carpal tunnel release Bilateral   . Coronary angioplasty with stent placement  10-02-2002  . Appendectomy      Family History  Problem Relation Age of Onset  . Dementia Mother   . Kidney disease Mother   . Heart attack Father 64    Died age 39  . Heart attack Brother 29  . Heart attack Brother 64    Social History:  reports that she has been smoking Cigarettes.  She has a 60 pack-year smoking history. She uses smokeless tobacco. She reports that she does not drink alcohol or use illicit drugs.  Allergies:  Allergies  Allergen Reactions  . Ace Inhibitors Dermatitis and Cough  . Altace [Ramipril]   . Keflex [Cephalexin] Itching  . Phenobarbital Other (See Comments)    Black spots on hands and feet.  . Sulfate Nausea And Vomiting and Other (See Comments)    Dizziness     Medications: Scheduled: Continuous:  No results found for this or any previous visit (from the past 24 hour(s)).   No results found.  ROS:  As stated above in the HPI otherwise negative.  There were no vitals taken for this visit.    PE: Gen: NAD, Alert and Oriented HEENT:  [place]/AT, EOMI Neck: Supple, no LAD Lungs: CTA Bilaterally CV: RRR without M/G/R ABM: Soft, NTND,  +BS Ext: No C/C/E  Assessment/Plan: 1) Screening colonoscopy.   She was cleared by Cardiology for the procedure.  Monica [name] D 05/29/2015, 12:10 PM

## 2015-05-29 NOTE — Telephone Encounter (Signed)
Rec'd from [place] forward 4 pages to dr. Percival Spanish

## 2015-05-30 ENCOUNTER — Ambulatory Visit (HOSPITAL_COMMUNITY): Payer: Medicare Other | Admitting: Anesthesiology

## 2015-05-30 ENCOUNTER — Encounter (HOSPITAL_COMMUNITY): Payer: Self-pay | Admitting: *Deleted

## 2015-05-30 ENCOUNTER — Ambulatory Visit (HOSPITAL_COMMUNITY)
Admission: RE | Admit: 2015-05-30 | Discharge: 2015-05-30 | Disposition: A | Discharge status: Home / Self Care | Payer: Medicare Other | Source: Ambulatory Visit | Attending: Gastroenterology | Admitting: Gastroenterology

## 2015-05-30 ENCOUNTER — Encounter (HOSPITAL_COMMUNITY)
Admission: RE | Disposition: A | Discharge status: Home / Self Care | Payer: Self-pay | Source: Ambulatory Visit | Attending: Gastroenterology

## 2015-05-30 DIAGNOSIS — D125 Benign neoplasm of sigmoid colon: Secondary | ICD-10-CM | POA: Insufficient documentation

## 2015-05-30 DIAGNOSIS — Z794 Long term (current) use of insulin: Secondary | ICD-10-CM | POA: Insufficient documentation

## 2015-05-30 DIAGNOSIS — E785 Hyperlipidemia, unspecified: Secondary | ICD-10-CM | POA: Insufficient documentation

## 2015-05-30 DIAGNOSIS — I2510 Atherosclerotic heart disease of native coronary artery without angina pectoris: Secondary | ICD-10-CM | POA: Insufficient documentation

## 2015-05-30 DIAGNOSIS — D123 Benign neoplasm of transverse colon: Secondary | ICD-10-CM | POA: Insufficient documentation

## 2015-05-30 DIAGNOSIS — I252 Old myocardial infarction: Secondary | ICD-10-CM | POA: Diagnosis not present

## 2015-05-30 DIAGNOSIS — I10 Essential (primary) hypertension: Secondary | ICD-10-CM | POA: Diagnosis not present

## 2015-05-30 DIAGNOSIS — D120 Benign neoplasm of cecum: Secondary | ICD-10-CM | POA: Insufficient documentation

## 2015-05-30 DIAGNOSIS — I509 Heart failure, unspecified: Secondary | ICD-10-CM | POA: Insufficient documentation

## 2015-05-30 DIAGNOSIS — E119 Type 2 diabetes mellitus without complications: Secondary | ICD-10-CM | POA: Diagnosis not present

## 2015-05-30 DIAGNOSIS — F17290 Nicotine dependence, other tobacco product, uncomplicated: Secondary | ICD-10-CM | POA: Insufficient documentation

## 2015-05-30 DIAGNOSIS — Z1211 Encounter for screening for malignant neoplasm of colon: Secondary | ICD-10-CM | POA: Insufficient documentation

## 2015-05-30 DIAGNOSIS — Z79899 Other long term (current) drug therapy: Secondary | ICD-10-CM | POA: Diagnosis not present

## 2015-05-30 DIAGNOSIS — K5730 Diverticulosis of large intestine without perforation or abscess without bleeding: Secondary | ICD-10-CM | POA: Diagnosis not present

## 2015-05-30 DIAGNOSIS — D124 Benign neoplasm of descending colon: Secondary | ICD-10-CM | POA: Insufficient documentation

## 2015-05-30 DIAGNOSIS — Z955 Presence of coronary angioplasty implant and graft: Secondary | ICD-10-CM | POA: Diagnosis not present

## 2015-05-30 DIAGNOSIS — F17210 Nicotine dependence, cigarettes, uncomplicated: Secondary | ICD-10-CM | POA: Insufficient documentation

## 2015-05-30 HISTORY — PX: COLONOSCOPY WITH PROPOFOL: SHX5780

## 2015-05-30 HISTORY — DX: Heart failure, unspecified: I50.9

## 2015-05-30 HISTORY — DX: Acute myocardial infarction, unspecified: I21.9

## 2015-05-30 LAB — GLUCOSE, CAPILLARY: GLUCOSE-CAPILLARY: 132 mg/dL — AB (ref 65–99)

## 2015-05-30 SURGERY — COLONOSCOPY WITH PROPOFOL
Anesthesia: Monitor Anesthesia Care

## 2015-05-30 MED ORDER — PROPOFOL 10 MG/ML IV BOLUS
INTRAVENOUS | Status: AC
  Filled 2015-05-30: qty 20

## 2015-05-30 MED ORDER — LACTATED RINGERS IV SOLN
INTRAVENOUS | Status: DC
  Administered 2015-05-30: 1000 mL via INTRAVENOUS

## 2015-05-30 MED ORDER — PROPOFOL 10 MG/ML IV BOLUS
INTRAVENOUS | Status: DC | PRN
  Administered 2015-05-30: 10 mg via INTRAVENOUS
  Administered 2015-05-30: 20 mg via INTRAVENOUS

## 2015-05-30 MED ORDER — PROPOFOL INFUSION 10 MG/ML OPTIME
INTRAVENOUS | Status: DC | PRN
Start: 2015-05-30 — End: 2015-05-30
  Administered 2015-05-30: 250 ug/kg/min via INTRAVENOUS

## 2015-05-30 MED ORDER — LIDOCAINE HCL (CARDIAC) 20 MG/ML IV SOLN
INTRAVENOUS | Status: AC
  Filled 2015-05-30: qty 5

## 2015-05-30 MED ORDER — SODIUM CHLORIDE 0.9 % IV SOLN
INTRAVENOUS | Status: DC
Start: 2015-05-30 — End: 2015-05-30

## 2015-05-30 SURGICAL SUPPLY — 21 items

## 2015-05-30 NOTE — Transfer of Care (Signed)
Immediate Anesthesia Transfer of Care Note  Patient: Monica [name]  Procedure(s) Performed: Procedure(s): COLONOSCOPY WITH PROPOFOL (N/A)  Patient Location: PACU, Endo recovery  Anesthesia Type:MAC  Level of Consciousness: Patient easily awoken, sedated, comfortable, cooperative, following commands, responds to stimulation.   Airway & Oxygen Therapy: Patient spontaneously breathing, ventilating well, oxygen via simple oxygen mask.  Post-op Assessment: Report given to PACU RN, vital signs reviewed and stable, moving all extremities.   Post vital signs: Reviewed and stable.  Complications: No apparent anesthesia complications

## 2015-05-30 NOTE — Op Note (Signed)
[facility] [place], [address]   COLONOSCOPY PROCEDURE REPORT  PATIENT: Monica, [name]  MR#: [id number] BIRTHDATE: 07/12/1946 , 68  yrs. old GENDER: female ENDOSCOPIST: Carol Ada, MD REFERRED BY: PROCEDURE DATE:  June 12, 2015 PROCEDURE:   Colonoscopy with snare polypectomy ASA CLASS:   Class III INDICATIONS: Screening MEDICATIONS: Monitored anesthesia care  DESCRIPTION OF PROCEDURE:   After the risks and benefits and of the procedure were explained, informed consent was obtained.  revealed no abnormalities of the rectum.    The Pentax Ped Colon [id number] endoscope was introduced through the anus and advanced to the cecum, which was identified by both the appendix and ileocecal valve .  The quality of the prep was excellent. .  The instrument was then slowly withdrawn as the colon was fully examined. Estimated blood loss is zero unless otherwise noted in this procedure report.   FINDINGS: A total of 7 polyps were identified int he cecum, transverse colon, descending colon, and sigmoid colon.  All the polyps were removoed with a cold snare.  The largest polyp was found in the cecum measuring 6-7 mm adjacent to the appendiceal orifice.  Scattered sigmoid diverticula were identified. Retroflexed views revealed no abnormalities.     The scope was then withdrawn from the patient and the procedure completed.  WITHDRAWAL TIME: 18 minutes  COMPLICATIONS: There were no immediate complications. ENDOSCOPIC IMPRESSION: 1) Colonic polyps. 2) Diverticula.  RECOMMENDATIONS: 1) Follow up biopsies. 2) Repeat the colonoscopy in 3-5 years.  REPEAT EXAM:  cc:  _______________________________ eSignedCarol Ada, MD June 12, 2015 12:28 PM   CPT CODES: ICD CODES:  The ICD and CPT codes recommended by this software are interpretations from the data that the clinical staff has captured with the software.  The verification of the translation of  this report to the ICD and CPT codes and modifiers is the sole responsibility of the health care institution and practicing physician where this report was generated.  [employer]. will not be held responsible for the validity of the ICD and CPT codes included on this report.  AMA assumes no liability for data contained or not contained herein. CPT is a Designer, television/film set of the [employer].   PATIENT NAME:  Monica, [name] MR#: [id number]

## 2015-05-30 NOTE — Discharge Instructions (Signed)
Colonoscopy, Care After Refer to this sheet in the next few weeks. These instructions provide you with information on caring for yourself after your procedure. Your health care provider may also give you more specific instructions. Your treatment has been planned according to current medical practices, but problems sometimes occur. Call your health care provider if you have any problems or questions after your procedure. WHAT TO EXPECT AFTER THE PROCEDURE  After your procedure, it is typical to have the following:  A small amount of blood in your stool.  Moderate amounts of gas and mild abdominal cramping or bloating. HOME CARE INSTRUCTIONS  Do not drive, operate machinery, or sign important documents for 24 hours.  You may shower and resume your regular physical activities, but move at a slower pace for the first 24 hours.  Take frequent rest periods for the first 24 hours.  Walk around or put a warm pack on your abdomen to help reduce abdominal cramping and bloating.  Drink enough fluids to keep your urine clear or pale yellow.  You may resume your normal diet as instructed by your health care provider. Avoid heavy or fried foods that are hard to digest.  Avoid drinking alcohol for 24 hours or as instructed by your health care provider.  Only take over-the-counter or prescription medicines as directed by your health care provider.  If a tissue sample (biopsy) was taken during your procedure:  Do not take aspirin or blood thinners for 7 days, or as instructed by your health care provider.  Do not drink alcohol for 7 days, or as instructed by your health care provider.  Eat soft foods for the first 24 hours. SEEK MEDICAL CARE IF: You have persistent spotting of blood in your stool 2-3 days after the procedure. SEEK IMMEDIATE MEDICAL CARE IF:  You have more than a small spotting of blood in your stool.  You pass large blood clots in your stool.  Your abdomen is swollen  (distended).  You have nausea or vomiting.  You have a fever.  You have increasing abdominal pain that is not relieved with medicine. Document Released: 05/11/2004 Document Revised: 07/18/2013 Document Reviewed: 06/04/2013 [facility] [employer] [facility] Patient Information 2015 [place], [place]. This information is not intended to replace advice given to you by your health care provider. Make sure you discuss any questions you have with your health care provider.  Monitored Anesthesia Care Monitored anesthesia care is an anesthesia service for a medical procedure. Anesthesia is the loss of the ability to feel pain. It is produced by medicines called anesthetics. It may affect a small area of your body (local anesthesia), a large area of your body (regional anesthesia), or your entire body (general anesthesia). The need for monitored anesthesia care depends your procedure, your condition, and the potential need for regional or general anesthesia. It is often provided during procedures where:   General anesthesia may be needed if there are complications. This is because you need special care when you are under general anesthesia.   You will be under local or regional anesthesia. This is so that you are able to have higher levels of anesthesia if needed.   You will receive calming medicines (sedatives). This is especially the case if sedatives are given to put you in a semi-conscious state of relaxation (deep sedation). This is because the amount of sedative needed to produce this state can be hard to predict. Too much of a sedative can produce general anesthesia. Monitored anesthesia care is performed by one  or more health care providers who have special training in all types of anesthesia. You will need to meet with these health care providers before your procedure. During this meeting, they will ask you about your medical history. They will also give you instructions to follow. (For example, you will need to stop  eating and drinking before your procedure. You may also need to stop or change medicines you are taking.) During your procedure, your health care providers will stay with you. They will:   Watch your condition. This includes watching your blood pressure, breathing, and level of pain.   Diagnose and treat problems that occur.   Give medicines if they are needed. These may include calming medicines (sedatives) and anesthetics.   Make sure you are comfortable.  Having monitored anesthesia care does not necessarily mean that you will be under anesthesia. It does mean that your health care providers will be able to manage anesthesia if you need it or if it occurs. It also means that you will be able to have a different type of anesthesia than you are having if you need it. When your procedure is complete, your health care providers will continue to watch your condition. They will make sure any medicines wear off before you are allowed to go home.  Document Released: 06/23/2005 Document Revised: 02/11/2014 Document Reviewed: 11/08/2012 [facility] Patient Information 2015 [address], [place]. This information is not intended to replace advice given to you by your health care provider. Make sure you discuss any questions you have with your health care provider.

## 2015-05-30 NOTE — Anesthesia Preprocedure Evaluation (Signed)
Anesthesia Evaluation  Patient identified by MRN, date of birth, ID band Patient awake    Reviewed: Allergy & Precautions, NPO status , Patient's Chart, lab work & pertinent test results, reviewed documented beta blocker date and time   Airway Mallampati: II  TM Distance: >3 FB Neck ROM: Full    Dental  (+) Partial Lower, Partial Upper   Pulmonary neg shortness of breath, Current Smoker,  breath sounds clear to auscultation  Pulmonary exam normal       Cardiovascular Exercise Tolerance: Good hypertension, Pt. on medications and Pt. on home beta blockers - angina+ CAD, + Past MI, + Cardiac Stents and +CHF Normal cardiovascular examRhythm:Regular Rate:Normal     Neuro/Psych Seizures -,  1 seizure 20 years ago    GI/Hepatic negative GI ROS, Neg liver ROS,   Endo/Other  diabetes, Well Controlled, Type 2, Insulin Dependent, Oral Hypoglycemic Agents  Renal/GU negative Renal ROS     Musculoskeletal negative musculoskeletal ROS (+)   Abdominal   Peds  Hematology negative hematology ROS (+)   Anesthesia Other Findings Day of surgery medications reviewed with the patient.  Reproductive/Obstetrics                             Anesthesia Physical Anesthesia Plan  ASA: III  Anesthesia Plan: MAC   Post-op Pain Management:    Induction: Intravenous  Airway Management Planned: Nasal Cannula  Additional Equipment:   Intra-op Plan:   Post-operative Plan:   Informed Consent: I have reviewed the patients History and Physical, chart, labs and discussed the procedure including the risks, benefits and alternatives for the proposed anesthesia with the patient or authorized representative who has indicated his/her understanding and acceptance.   Dental advisory given  Plan Discussed with: CRNA and Anesthesiologist  Anesthesia Plan Comments: (Discussed risks/benefits/alternatives to MAC sedation  including need for ventilatory support, hypotension, need for conversion to general anesthesia.  All patient questions answered.  Patient wished to proceed.)        Anesthesia Quick Evaluation

## 2015-05-30 NOTE — Anesthesia Postprocedure Evaluation (Signed)
  Anesthesia Post-op Note  Patient: Monica [name]  Procedure(s) Performed: Procedure(s) (LRB): COLONOSCOPY WITH PROPOFOL (N/A)  Patient Location: PACU  Anesthesia Type: MAC  Level of Consciousness: awake and alert   Airway and Oxygen Therapy: Patient Spontanous Breathing  Post-op Pain: mild  Post-op Assessment: Post-op Vital signs reviewed, Patient's Cardiovascular Status Stable, Respiratory Function Stable, Patent Airway and No signs of Nausea or vomiting  Last Vitals:  Filed Vitals:   05/30/15 1240  BP: 107/63  Pulse: 62  Temp:   Resp: 20    Post-op Vital Signs: stable   Complications: No apparent anesthesia complications

## 2015-06-02 ENCOUNTER — Encounter (HOSPITAL_COMMUNITY): Payer: Self-pay | Admitting: Gastroenterology

## 2015-06-10 ENCOUNTER — Encounter: Payer: Self-pay | Admitting: Cardiology

## 2016-03-14 NOTE — Progress Notes (Signed)
Cardiology Office Note   Date:  03/15/2016   ID:  Monica [name], DOB [birth year], MRN [id number]  PCP:  Jani Gravel, MD  Cardiologist:   Minus Breeding, MD   No chief complaint on file.     History of Present Illness: Monica [name] is a 70 y.o. female who presents for evaluation of coronary artery disease. She had previous stenting to an LAD. This was in 2003. She had a Cipher stent. Her EF was 35% at that time and she required treatment with ventilation and intra-aortic balloon pump.  I saw her for the first time last year after being gone from the practice for six years.  Follow up echo showed an EF of 65%.  POET (Plain Old Exercise Treadmill) was negative.  She returns for follow up.  The patient denies any new symptoms such as chest discomfort, neck or arm discomfort. There has been no new shortness of breath, PND or orthopnea. There have been no reported palpitations, presyncope or syncope.   Past Medical History  Diagnosis Date  . Hyperlipidemia   . Hypertension   . Diabetes mellitus ([place])   . Sinusitis   . Osteoporosis   . CAD (coronary artery disease)     Cypher stent 09/2002  . CHF (congestive heart failure) ([place])   . Myocardial infarction ([place])     2003,went into cardiac shock    Past Surgical History  Procedure Laterality Date  . Umbilical hernia repair    . Tubal ligation    . Exploratory abdominal      Bleeding in abdomen after tubal  . Carpal tunnel release Bilateral   . Coronary angioplasty with stent placement  10-02-2002  . Appendectomy    . Colonoscopy with propofol N/A 05/30/2015    Procedure: COLONOSCOPY WITH PROPOFOL;  Surgeon: Carol Ada, MD;  Location: [facility];  Service: Endoscopy;  Laterality: N/A;     Current Outpatient Prescriptions  Medication Sig Dispense Refill  . acetaminophen (TYLENOL) 500 MG tablet Take 1,000 mg by mouth every 6 (six) hours as needed for moderate pain or headache.    Marland Kitchen aspirin 81 MG tablet Take 81 mg by mouth  daily.    Marland Kitchen atorvastatin (LIPITOR) 10 MG tablet Take 10 mg by mouth daily.     . Calcium Carbonate-Vitamin D 600-400 MG-UNIT per tablet Take 1 tablet by mouth 2 (two) times daily.    . carvedilol (COREG) 3.125 MG tablet Take 3.125 mg by mouth 2 (two) times daily with a meal.    . clonazePAM (KLONOPIN) 1 MG tablet Take 1 mg by mouth daily.    Marland Kitchen glimepiride (AMARYL) 4 MG tablet Take 4 mg by mouth 2 (two) times daily.    . halobetasol (ULTRAVATE) 0.05 % cream Apply 1 application topically daily as needed (rash on leg).     . Insulin Detemir (LEVEMIR) 100 UNIT/ML Pen Inject 30 Units into the skin every morning.     . Insulin Lispro, Human, (HUMALOG KWIKPEN) 200 UNIT/ML SOPN Inject 3-5 Units into the skin 3 (three) times daily. 6 units in the morning, 6 units in the afternoon, 9 units at bedtime    . losartan (COZAAR) 50 MG tablet Take 50 mg by mouth daily.    . metFORMIN (GLUCOPHAGE) 500 MG tablet Take by mouth 2 (two) times daily with a meal.    . Multiple Vitamin (MULTIVITAMIN) tablet Take 1 tablet by mouth daily.    . Omega-3 Fatty Acids (FISH OIL) 1000 MG  CAPS Take 1,000 mg by mouth daily.    Marland Kitchen omeprazole (PRILOSEC) 20 MG capsule Take 20 mg by mouth daily.    . pioglitazone (ACTOS) 15 MG tablet Take 15 mg by mouth daily.    . risedronate (ACTONEL) 150 MG tablet Take 150 mg by mouth every 30 (thirty) days.    Marland Kitchen sertraline (ZOLOFT) 50 MG tablet Take 50 mg by mouth daily.     No current facility-administered medications for this visit.    Allergies:   Ace inhibitors; Altace; Keflex; Phenobarbital; and Sulfate    ROS:  Please see the history of present illness.   Otherwise, review of systems are positive for none.   All other systems are reviewed and negative.    PHYSICAL EXAM: VS:  BP 128/78 mmHg  Pulse 62  Ht '5\' 5"'$  (1.651 m)  Wt 129 lb (58.514 kg)  BMI 21.47 kg/m2  SpO2 98% , BMI Body mass index is 21.47 kg/(m^2). GENERAL:  Well appearing NECK:  No jugular venous distention, waveform  within normal limits, carotid upstroke brisk and symmetric, no bruits, no thyromegaly LUNGS:  Clear to auscultation bilaterally BACK:  No CVA tenderness CHEST:  Unremarkable HEART:  PMI not displaced or sustained,S1 and S2 within normal limits, no S3, no S4, no clicks, no rubs, no murmurs ABD:  Flat, positive bowel sounds normal in frequency in pitch, no bruits, no rebound, no guarding, no midline pulsatile mass, no hepatomegaly, no splenomegaly EXT:  2 plus pulses throughout, no edema, no cyanosis no clubbing    EKG:  EKG is ordered today. The ekg ordered today demonstrates  Sinus rhythm, rate 63, axis within normal limits, intervals within normal limits, no acute ST-T wave changes.    Recent Labs:    Lipid Panel LDL 9, HDL 28 triglycerides 228 A1c 6.3    Wt Readings from Last 3 Encounters:  03/15/16 129 lb (58.514 kg)  05/30/15 123 lb 8 oz (56.019 kg)  03/12/15 125 lb (56.7 kg)      Other studies Reviewed: Additional studies/ records that were reviewed today include: Primary office records including labs. Review of the above records demonstrates:  Please see elsewhere in the note.     ASSESSMENT AND PLAN:  CAD:  The patient has no new sypmtoms.  No further cardiovascular testing is indicated.  We will continue with aggressive risk reduction and meds as listed.  DYSLIPIDEMIA:  She has this followed by Jani Gravel, MD.  The goal should be less than 70 LDL.    TOBACCO:  She is on e cigarettes and she is encouraged to stop using these over time.   Current medicines are reviewed at length with the patient today.  The patient does not have concerns regarding medicines.  The following changes have been made:  None  Labs/ tests ordered today include:   No orders of the defined types were placed in this encounter.     Disposition:   FU with me in one year.     Signed, Minus Breeding, MD  03/15/2016 12:34 PM     [facility]

## 2016-03-15 ENCOUNTER — Ambulatory Visit (INDEPENDENT_AMBULATORY_CARE_PROVIDER_SITE_OTHER): Payer: Medicare Other | Admitting: Cardiology

## 2016-03-15 ENCOUNTER — Encounter: Payer: Self-pay | Admitting: Cardiology

## 2016-03-15 VITALS — BP 128/78 | HR 62 | Ht 65.0 in | Wt 129.0 lb

## 2016-03-15 DIAGNOSIS — I2510 Atherosclerotic heart disease of native coronary artery without angina pectoris: Secondary | ICD-10-CM

## 2016-03-15 NOTE — Patient Instructions (Signed)
Your physician wants you to follow-up in: 1 Year. You will receive a reminder letter in the mail two months in advance. If you don't receive a letter, please call our office to schedule the follow-up appointment.  

## 2016-07-09 ENCOUNTER — Emergency Department (HOSPITAL_COMMUNITY): Payer: Medicare Other

## 2016-07-09 ENCOUNTER — Encounter (HOSPITAL_COMMUNITY): Payer: Self-pay | Admitting: *Deleted

## 2016-07-09 ENCOUNTER — Emergency Department (HOSPITAL_COMMUNITY)
Admission: EM | Admit: 2016-07-09 | Discharge: 2016-07-09 | Disposition: A | Discharge status: Home / Self Care | Payer: Medicare Other | Attending: Emergency Medicine | Admitting: Emergency Medicine

## 2016-07-09 DIAGNOSIS — I110 Hypertensive heart disease with heart failure: Secondary | ICD-10-CM | POA: Insufficient documentation

## 2016-07-09 DIAGNOSIS — Z955 Presence of coronary angioplasty implant and graft: Secondary | ICD-10-CM | POA: Diagnosis not present

## 2016-07-09 DIAGNOSIS — Z7982 Long term (current) use of aspirin: Secondary | ICD-10-CM | POA: Insufficient documentation

## 2016-07-09 DIAGNOSIS — B349 Viral infection, unspecified: Secondary | ICD-10-CM | POA: Insufficient documentation

## 2016-07-09 DIAGNOSIS — I2510 Atherosclerotic heart disease of native coronary artery without angina pectoris: Secondary | ICD-10-CM | POA: Diagnosis not present

## 2016-07-09 DIAGNOSIS — I252 Old myocardial infarction: Secondary | ICD-10-CM | POA: Insufficient documentation

## 2016-07-09 DIAGNOSIS — I509 Heart failure, unspecified: Secondary | ICD-10-CM | POA: Diagnosis not present

## 2016-07-09 DIAGNOSIS — F17210 Nicotine dependence, cigarettes, uncomplicated: Secondary | ICD-10-CM | POA: Diagnosis not present

## 2016-07-09 DIAGNOSIS — Z7984 Long term (current) use of oral hypoglycemic drugs: Secondary | ICD-10-CM | POA: Diagnosis not present

## 2016-07-09 DIAGNOSIS — R6889 Other general symptoms and signs: Secondary | ICD-10-CM

## 2016-07-09 DIAGNOSIS — E119 Type 2 diabetes mellitus without complications: Secondary | ICD-10-CM | POA: Diagnosis not present

## 2016-07-09 DIAGNOSIS — Z794 Long term (current) use of insulin: Secondary | ICD-10-CM | POA: Diagnosis not present

## 2016-07-09 LAB — URINE MICROSCOPIC-ADD ON: RBC / HPF: NONE SEEN RBC/hpf (ref 0–5)

## 2016-07-09 LAB — CBC
HEMATOCRIT: 42.9 % (ref 36.0–46.0)
HEMOGLOBIN: 14.5 g/dL (ref 12.0–15.0)
MCH: 27.3 pg (ref 26.0–34.0)
MCHC: 33.8 g/dL (ref 30.0–36.0)
MCV: 80.8 fL (ref 78.0–100.0)
PLATELETS: 108 10*3/uL — AB (ref 150–400)
RBC: 5.31 MIL/uL — AB (ref 3.87–5.11)
RDW: 14.2 % (ref 11.5–15.5)
WBC: 6.3 10*3/uL (ref 4.0–10.5)

## 2016-07-09 LAB — I-STAT CG4 LACTIC ACID, ED
LACTIC ACID, VENOUS: 1.25 mmol/L (ref 0.5–1.9)
Lactic Acid, Venous: 2.8 mmol/L (ref 0.5–1.9)

## 2016-07-09 LAB — COMPREHENSIVE METABOLIC PANEL
ALT: 39 U/L (ref 14–54)
ANION GAP: 12 (ref 5–15)
AST: 40 U/L (ref 15–41)
Albumin: 4 g/dL (ref 3.5–5.0)
Alkaline Phosphatase: 50 U/L (ref 38–126)
BUN: 12 mg/dL (ref 6–20)
CHLORIDE: 103 mmol/L (ref 101–111)
CO2: 21 mmol/L — AB (ref 22–32)
Calcium: 9.7 mg/dL (ref 8.9–10.3)
Creatinine, Ser: 0.6 mg/dL (ref 0.44–1.00)
Glucose, Bld: 236 mg/dL — ABNORMAL HIGH (ref 65–99)
POTASSIUM: 3.7 mmol/L (ref 3.5–5.1)
SODIUM: 136 mmol/L (ref 135–145)
Total Bilirubin: 0.6 mg/dL (ref 0.3–1.2)
Total Protein: 6.8 g/dL (ref 6.5–8.1)

## 2016-07-09 LAB — URINALYSIS, ROUTINE W REFLEX MICROSCOPIC
Bilirubin Urine: NEGATIVE
GLUCOSE, UA: 100 mg/dL — AB
Hgb urine dipstick: NEGATIVE
KETONES UR: NEGATIVE mg/dL
NITRITE: NEGATIVE
PROTEIN: NEGATIVE mg/dL
Specific Gravity, Urine: 1.009 (ref 1.005–1.030)
pH: 6 (ref 5.0–8.0)

## 2016-07-09 LAB — LIPASE, BLOOD: LIPASE: 41 U/L (ref 11–51)

## 2016-07-09 LAB — BRAIN NATRIURETIC PEPTIDE: B Natriuretic Peptide: 32.2 pg/mL (ref 0.0–100.0)

## 2016-07-09 LAB — TROPONIN I: Troponin I: <0.03 ng/mL (ref <0.03)

## 2016-07-09 MED ORDER — SODIUM CHLORIDE 0.9 % IV SOLN
INTRAVENOUS | Status: DC
  Administered 2016-07-09: 05:00:00 via INTRAVENOUS

## 2016-07-09 NOTE — ED Notes (Signed)
Pt taken to xray 

## 2016-07-09 NOTE — ED Triage Notes (Signed)
Pt to ED by EMS c/o flu like symptoms x 4 days. Tonight pt started having intermittent episodes of diaphoresis and nausea. Denies cp or sob. Pt also c/o numbness and tingling in all extremities. Has had a dry cough and diarrhea tonight. Pt took '324mg'$  ASA per dispatcher instructions

## 2016-07-09 NOTE — ED Provider Notes (Signed)
[facility] DEPT Provider Note   CSN: [id number] Arrival date & time: 07/09/16  0242     History   Chief Complaint Chief Complaint  Patient presents with  . Influenza    HPI Monica [name] is a 70 y.o. female.  HPI Patient states she has not felt well for 4 days. She reports that she was having quite a bit a dry cough. She denies chest pain or shortness of breath. She feels like she's had a fever with getting hot flashes and sweats. She states she is measured her temperature though and it has not been elevated. She reports that symptoms started on Monday, on Tuesday she had an episode of vomiting. She reports she had one episode of diarrhea today. She reports she's continued to take and water but has not been eating very much. She reports all day today she's felt like she is tingly all over. It involves her face both upper extremities and both lower extremities. No focal weakness or gait incoordination. Past Medical History:  Diagnosis Date  . CAD (coronary artery disease)    Cypher stent 09/2002  . CHF (congestive heart failure) ([place])   . Diabetes mellitus ([place])   . Hyperlipidemia   . Hypertension   . Myocardial infarction ([place])    2003,went into cardiac shock  . Osteoporosis   . Sinusitis     Patient Active Problem List   Diagnosis Date Noted  . CAD (coronary artery disease) 03/12/2015    Past Surgical History:  Procedure Laterality Date  . APPENDECTOMY    . CARPAL TUNNEL RELEASE Bilateral   . COLONOSCOPY WITH PROPOFOL N/A 05/30/2015   Procedure: COLONOSCOPY WITH PROPOFOL;  Surgeon: Carol Ada, MD;  Location: [facility];  Service: Endoscopy;  Laterality: N/A;  . CORONARY ANGIOPLASTY WITH STENT PLACEMENT  10-02-2002  . Exploratory abdominal     Bleeding in abdomen after tubal  . TUBAL LIGATION    . UMBILICAL HERNIA REPAIR      OB History    No data available       Home Medications    Prior to Admission medications   Medication Sig Start Date End  Date Taking? Authorizing Provider  acetaminophen (TYLENOL) 500 MG tablet Take 1,000 mg by mouth every 6 (six) hours as needed for moderate pain or headache.    Historical Provider, MD  aspirin 81 MG tablet Take 81 mg by mouth daily.    Historical Provider, MD  atorvastatin (LIPITOR) 10 MG tablet Take 10 mg by mouth daily.     Historical Provider, MD  Calcium Carbonate-Vitamin D 600-400 MG-UNIT per tablet Take 1 tablet by mouth 2 (two) times daily.    Historical Provider, MD  carvedilol (COREG) 3.125 MG tablet Take 3.125 mg by mouth 2 (two) times daily with a meal.    Historical Provider, MD  clonazePAM (KLONOPIN) 1 MG tablet Take 1 mg by mouth daily. 03/02/16   Historical Provider, MD  glimepiride (AMARYL) 4 MG tablet Take 4 mg by mouth 2 (two) times daily. 03/03/16   Historical Provider, MD  halobetasol (ULTRAVATE) 0.05 % cream Apply 1 application topically daily as needed (rash on leg).     Historical Provider, MD  Insulin Detemir (LEVEMIR) 100 UNIT/ML Pen Inject 30 Units into the skin every morning.     Historical Provider, MD  Insulin Lispro, Human, (HUMALOG KWIKPEN) 200 UNIT/ML SOPN Inject 3-5 Units into the skin 3 (three) times daily. 6 units in the morning, 6 units in the afternoon, 9  units at bedtime    Historical Provider, MD  losartan (COZAAR) 50 MG tablet Take 50 mg by mouth daily.    Historical Provider, MD  metFORMIN (GLUCOPHAGE) 500 MG tablet Take by mouth 2 (two) times daily with a meal.    Historical Provider, MD  Multiple Vitamin (MULTIVITAMIN) tablet Take 1 tablet by mouth daily.    Historical Provider, MD  Omega-3 Fatty Acids (FISH OIL) 1000 MG CAPS Take 1,000 mg by mouth daily.    Historical Provider, MD  omeprazole (PRILOSEC) 20 MG capsule Take 20 mg by mouth daily.    Historical Provider, MD  pioglitazone (ACTOS) 15 MG tablet Take 15 mg by mouth daily.    Historical Provider, MD  risedronate (ACTONEL) 150 MG tablet Take 150 mg by mouth every 30 (thirty) days. 03/02/16   Historical  Provider, MD  sertraline (ZOLOFT) 50 MG tablet Take 50 mg by mouth daily. 03/02/16   Historical Provider, MD    Family History Family History  Problem Relation Age of Onset  . Dementia Mother   . Kidney disease Mother   . Heart attack Father 37    Died age 22  . Heart attack Brother 57  . Heart attack Brother 26    Social History Social History  Substance Use Topics  . Smoking status: Current Some Day Smoker    Packs/day: 2.00    Years: 30.00    Types: Cigarettes  . Smokeless tobacco: Current User     Comment: e cig  . Alcohol use No     Allergies   Ace inhibitors; Altace [ramipril]; Keflex [cephalexin]; Phenobarbital; and Sulfate   Review of Systems Review of Systems  10 Systems reviewed and are negative for acute change except as noted in the HPI.  Physical Exam Updated Vital Signs BP 141/88   Pulse 106   Temp 97.9 F (36.6 C) (Oral)   Resp 23   SpO2 90%   Physical Exam  Constitutional: She is oriented to person, place, and time. She appears well-developed and well-nourished. No distress.  HENT:  Head: Normocephalic and atraumatic.  Nose: Nose normal.  Mouth/Throat: Oropharynx is clear and moist.  Eyes: Conjunctivae and EOM are normal. Pupils are equal, round, and reactive to light.  Neck: Neck supple.  Cardiovascular: Normal rate and regular rhythm.   No murmur heard. Pulmonary/Chest: Effort normal and breath sounds normal. No respiratory distress.  Abdominal: Soft. There is no tenderness.  Musculoskeletal: She exhibits no edema, tenderness or deformity.  No peripheral edema. Excellent skin condition of lower extremities.  Neurological: She is alert and oriented to person, place, and time. No cranial nerve deficit. She exhibits normal muscle tone. Coordination normal.  Skin: Skin is warm and dry. No rash noted.  Psychiatric: She has a normal mood and affect.  Nursing note and vitals reviewed.    ED Treatments / Results  Labs (all labs ordered are  listed, but only abnormal results are displayed) Labs Reviewed  COMPREHENSIVE METABOLIC PANEL - Abnormal; Notable for the following:       Result Value   CO2 21 (*)    Glucose, Bld 236 (*)    All other components within normal limits  CBC - Abnormal; Notable for the following:    RBC 5.31 (*)    Platelets 108 (*)    All other components within normal limits  URINALYSIS, ROUTINE W REFLEX MICROSCOPIC (NOT AT [facility]) - Abnormal; Notable for the following:    Glucose, UA 100 (*)  Leukocytes, UA SMALL (*)    All other components within normal limits  URINE MICROSCOPIC-ADD ON - Abnormal; Notable for the following:    Squamous Epithelial / LPF 0-5 (*)    Bacteria, UA RARE (*)    All other components within normal limits  I-STAT CG4 LACTIC ACID, ED - Abnormal; Notable for the following:    Lactic Acid, Venous 2.80 (*)    All other components within normal limits  LIPASE, BLOOD  BRAIN NATRIURETIC PEPTIDE  TROPONIN I  I-STAT CG4 LACTIC ACID, ED    EKG  EKG Interpretation  Date/Time:  Friday July 09 2016 04:35:42 EDT Ventricular Rate:  68 PR Interval:    QRS Duration: 139 QT Interval:  472 QTC Calculation: 502 R Axis:   50 Text Interpretation:  Sinus rhythm IVCD, consider atypical RBBB agree. no STEMI Confirmed by Johnney Killian, MD, Jeannie Done [phone]) on 07/09/2016 4:47:14 AM Also confirmed by Johnney Killian, MD, Jeannie Done [phone]), editor [place]  CCT, [place] ([id number])  on 07/09/2016 6:58:17 AM       Radiology Dg Chest 2 View  Result Date: 07/09/2016 CLINICAL DATA:  Chest pain, extremity numbness and cough tonight. History of CHF, hypertension. EXAM: CHEST  2 VIEW COMPARISON:  Chest radiograph May 23, 2009 FINDINGS: Cardiomediastinal silhouette is normal. Mild calcific atherosclerosis of the aortic arch. Coronary artery calcification versus stent. No pleural effusions or focal consolidations. Trachea projects midline and there is no pneumothorax. Stable granuloma projecting in RIGHT upper  lobe. Mild bronchitic changes. Soft tissue planes and included osseous structures are non-suspicious. IMPRESSION: Mild bronchitic changes. Electronically Signed   By: Elon Alas M.D.   On: 07/09/2016 05:27    Procedures Procedures (including critical care time)  Medications Ordered in ED Medications  0.9 %  sodium chloride infusion ( Intravenous Stopped 07/09/16 0549)     Initial Impression / Assessment and Plan / ED Course  I have reviewed the triage vital signs and the nursing notes.  Pertinent labs & imaging results that were available during my care of the patient were reviewed by me and considered in my medical decision making (see chart for details).  Clinical Course    Final Clinical Impressions(s) / ED Diagnoses   Final diagnoses:  Flu-like symptoms   Patient has a constellation of symptoms suggestive of viral illness. At this time diagnostic workup does not show focus of pneumonia. She does not have fever or leukocytosis. I will not opt for empiric antibiotics at this time. She does exhibit dry cough in the room but no evidence of dyspnea. Pulmonary examination is normal. Patient perceived diffuse tingling all day today. This encompassed her face both upper extremities and lower extremity. No associated neurologic dysfunction. At this time, I do not suspect CVA or other acute intracranial etiology. She does not have pain associated such as headache or neck or back pain. Patient is counseled on signs and symptoms for which to return. She will follow up with her family provider beginning of the week. New Prescriptions New Prescriptions   No medications on file     Charlesetta Shanks, MD 07/09/16 [phone]

## 2016-07-30 ENCOUNTER — Other Ambulatory Visit: Payer: Self-pay | Admitting: Internal Medicine

## 2016-07-30 DIAGNOSIS — R945 Abnormal results of liver function studies: Secondary | ICD-10-CM

## 2016-08-09 ENCOUNTER — Ambulatory Visit
Admission: RE | Admit: 2016-08-09 | Discharge: 2016-08-09 | Disposition: A | Discharge status: Home / Self Care | Payer: Medicare Other | Source: Ambulatory Visit | Attending: Internal Medicine | Admitting: Internal Medicine

## 2016-08-09 DIAGNOSIS — R945 Abnormal results of liver function studies: Secondary | ICD-10-CM

## 2016-10-11 DIAGNOSIS — C801 Malignant (primary) neoplasm, unspecified: Secondary | ICD-10-CM

## 2016-10-11 DIAGNOSIS — C50919 Malignant neoplasm of unspecified site of unspecified female breast: Secondary | ICD-10-CM

## 2016-10-11 HISTORY — DX: Malignant (primary) neoplasm, unspecified: C80.1

## 2016-10-11 HISTORY — DX: Malignant neoplasm of unspecified site of unspecified female breast: C50.919

## 2017-02-09 ENCOUNTER — Other Ambulatory Visit: Payer: Self-pay | Admitting: Radiology

## 2017-02-11 ENCOUNTER — Telehealth: Payer: Self-pay | Admitting: *Deleted

## 2017-02-11 NOTE — Telephone Encounter (Signed)
Confirmed [facility] for 02/16/17 at 1215 .  Instructions and contact information given.

## 2017-02-13 ENCOUNTER — Encounter: Payer: Self-pay | Admitting: General Surgery

## 2017-02-15 ENCOUNTER — Encounter: Payer: Self-pay | Admitting: *Deleted

## 2017-02-15 ENCOUNTER — Other Ambulatory Visit: Payer: Self-pay | Admitting: *Deleted

## 2017-02-15 DIAGNOSIS — C50212 Malignant neoplasm of upper-inner quadrant of left female breast: Secondary | ICD-10-CM

## 2017-02-15 DIAGNOSIS — Z170 Estrogen receptor positive status [ER+]: Secondary | ICD-10-CM

## 2017-02-16 ENCOUNTER — Ambulatory Visit
Admission: RE | Admit: 2017-02-16 | Discharge: 2017-02-16 | Disposition: A | Discharge status: Home / Self Care | Payer: Medicare Other | Source: Ambulatory Visit | Attending: Radiation Oncology | Admitting: Radiation Oncology

## 2017-02-16 ENCOUNTER — Other Ambulatory Visit: Payer: Self-pay | Admitting: General Surgery

## 2017-02-16 ENCOUNTER — Ambulatory Visit (HOSPITAL_BASED_OUTPATIENT_CLINIC_OR_DEPARTMENT_OTHER): Payer: Medicare Other | Admitting: Hematology

## 2017-02-16 ENCOUNTER — Other Ambulatory Visit: Payer: Medicare Other

## 2017-02-16 ENCOUNTER — Encounter: Payer: Self-pay | Admitting: Physical Therapy

## 2017-02-16 ENCOUNTER — Encounter: Payer: Self-pay | Admitting: Hematology

## 2017-02-16 ENCOUNTER — Ambulatory Visit: Payer: Medicare Other | Attending: General Surgery | Admitting: Physical Therapy

## 2017-02-16 ENCOUNTER — Encounter: Payer: Self-pay | Admitting: General Practice

## 2017-02-16 VITALS — BP 120/60 | HR 55 | Temp 97.9°F | Resp 18 | Ht 65.0 in | Wt 125.7 lb

## 2017-02-16 DIAGNOSIS — E119 Type 2 diabetes mellitus without complications: Secondary | ICD-10-CM | POA: Insufficient documentation

## 2017-02-16 DIAGNOSIS — Z510 Encounter for antineoplastic radiation therapy: Secondary | ICD-10-CM | POA: Insufficient documentation

## 2017-02-16 DIAGNOSIS — C50212 Malignant neoplasm of upper-inner quadrant of left female breast: Secondary | ICD-10-CM

## 2017-02-16 DIAGNOSIS — Z9889 Other specified postprocedural states: Secondary | ICD-10-CM | POA: Insufficient documentation

## 2017-02-16 DIAGNOSIS — R293 Abnormal posture: Secondary | ICD-10-CM | POA: Insufficient documentation

## 2017-02-16 DIAGNOSIS — Z820 Family history of epilepsy and other diseases of the nervous system: Secondary | ICD-10-CM | POA: Insufficient documentation

## 2017-02-16 DIAGNOSIS — Z170 Estrogen receptor positive status [ER+]: Secondary | ICD-10-CM

## 2017-02-16 DIAGNOSIS — Z79899 Other long term (current) drug therapy: Secondary | ICD-10-CM | POA: Insufficient documentation

## 2017-02-16 DIAGNOSIS — D696 Thrombocytopenia, unspecified: Secondary | ICD-10-CM | POA: Diagnosis not present

## 2017-02-16 DIAGNOSIS — N6489 Other specified disorders of breast: Secondary | ICD-10-CM | POA: Insufficient documentation

## 2017-02-16 DIAGNOSIS — Z7982 Long term (current) use of aspirin: Secondary | ICD-10-CM | POA: Insufficient documentation

## 2017-02-16 DIAGNOSIS — Z8249 Family history of ischemic heart disease and other diseases of the circulatory system: Secondary | ICD-10-CM | POA: Insufficient documentation

## 2017-02-16 DIAGNOSIS — Z794 Long term (current) use of insulin: Secondary | ICD-10-CM | POA: Insufficient documentation

## 2017-02-16 DIAGNOSIS — Z841 Family history of disorders of kidney and ureter: Secondary | ICD-10-CM | POA: Insufficient documentation

## 2017-02-16 DIAGNOSIS — I2510 Atherosclerotic heart disease of native coronary artery without angina pectoris: Secondary | ICD-10-CM | POA: Insufficient documentation

## 2017-02-16 DIAGNOSIS — F17210 Nicotine dependence, cigarettes, uncomplicated: Secondary | ICD-10-CM | POA: Insufficient documentation

## 2017-02-16 DIAGNOSIS — I252 Old myocardial infarction: Secondary | ICD-10-CM | POA: Insufficient documentation

## 2017-02-16 DIAGNOSIS — Z888 Allergy status to other drugs, medicaments and biological substances status: Secondary | ICD-10-CM | POA: Insufficient documentation

## 2017-02-16 DIAGNOSIS — E785 Hyperlipidemia, unspecified: Secondary | ICD-10-CM | POA: Insufficient documentation

## 2017-02-16 DIAGNOSIS — I110 Hypertensive heart disease with heart failure: Secondary | ICD-10-CM | POA: Insufficient documentation

## 2017-02-16 DIAGNOSIS — I509 Heart failure, unspecified: Secondary | ICD-10-CM | POA: Insufficient documentation

## 2017-02-16 NOTE — Patient Instructions (Signed)

## 2017-02-16 NOTE — Progress Notes (Signed)
Radiation Oncology         [phone]) [phone] ________________________________  Name: Monica [name] MRN: [id number]  Date: 02/16/2017  DOB: Dec 31, 1945  CC:Kim, Fayrene Fearing, MD  Claud Kelp, MD     REFERRING PHYSICIAN: Claud Kelp, MD   DIAGNOSIS: The encounter diagnosis was Malignant neoplasm of upper-inner quadrant of left breast in female, estrogen receptor positive (HCC).   HISTORY OF PRESENT ILLNESS: Monica [name] is a 71 y.o. female seen in the multidisciplinary breast conference for a new diagnosis of left breast cancer. The patient had a screening asymmetry of the left breast. She was found at 11 o'clock to have a mass, and diagnostic imaging revealed a 9 mm lesion at the 11 o'clock position. The axilla was negative for adenopathy by ultrasound. 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    PREVIOUS RADIATION THERAPY: No   PAST MEDICAL HISTORY:  Past Medical History:  Diagnosis Date  . CAD (coronary artery disease)    Cypher stent 09/2002  . CHF (congestive heart failure) (HCC)   . Diabetes mellitus (HCC)   . Hyperlipidemia   . Hypertension   . Myocardial infarction (HCC)    2003,went into cardiac shock  . Osteoporosis   . Sinusitis        PAST SURGICAL HISTORY: Past Surgical History:  Procedure Laterality Date  . APPENDECTOMY    . CARPAL TUNNEL RELEASE Bilateral   . COLONOSCOPY WITH PROPOFOL N/A 05/30/2015   Procedure: COLONOSCOPY WITH PROPOFOL;  Surgeon: Jeani Hawking, MD;  Location: [facility];  Service: Endoscopy;  Laterality: N/A;  . CORONARY ANGIOPLASTY WITH STENT PLACEMENT  10-02-2002  . Exploratory abdominal     Bleeding in abdomen after tubal  . TUBAL LIGATION    . UMBILICAL HERNIA REPAIR       FAMILY HISTORY:  Family History  Problem Relation Age of Onset  . Dementia Mother   . Kidney disease Mother   . Heart attack Father 67    Died age 96  . Heart attack  Brother 55  . Heart attack Brother 60     SOCIAL HISTORY:  reports that she has been smoking Cigarettes.  She has a 60.00 pack-year smoking history. She uses smokeless tobacco. She reports that she does not drink alcohol or use drugs. The patient is married and lives in [place].    ALLERGIES: Ace inhibitors; Altace [ramipril]; Keflex [cephalexin]; Phenobarbital; and Sulfate   MEDICATIONS:  Current Outpatient Prescriptions  Medication Sig Dispense Refill  . acetaminophen (TYLENOL) 500 MG tablet Take 1,000 mg by mouth every 6 (six) hours as needed for moderate pain or headache.    Marland Kitchen aspirin 81 MG tablet Take 81 mg by mouth daily.    Marland Kitchen atorvastatin (LIPITOR) 10 MG tablet Take 10 mg by mouth daily.     . Calcium Carbonate-Vitamin D 600-400 MG-UNIT per tablet Take 1 tablet by mouth 2 (two) times daily.    . carvedilol (COREG) 3.125 MG tablet Take 3.125 mg by mouth 2 (two) times daily with a meal.    . clonazePAM (KLONOPIN) 1 MG tablet Take 1 mg by mouth daily.    Marland Kitchen glimepiride (AMARYL) 4 MG tablet Take 4 mg by mouth 2 (two) times daily.    . halobetasol (ULTRAVATE) 0.05 % cream Apply 1 application topically daily as needed (rash on leg).     . Insulin Detemir (LEVEMIR) 100 UNIT/ML Pen Inject 30 Units  into the skin every morning.     . Insulin Lispro, Human, (HUMALOG KWIKPEN) 200 UNIT/ML SOPN Inject 3-5 Units into the skin 3 (three) times daily. 6 units in the morning, 6 units in the afternoon, 9 units at bedtime    . losartan (COZAAR) 50 MG tablet Take 50 mg by mouth daily.    . metFORMIN (GLUCOPHAGE) 500 MG tablet Take by mouth 2 (two) times daily with a meal.    . Multiple Vitamin (MULTIVITAMIN) tablet Take 1 tablet by mouth daily.    . Omega-3 Fatty Acids (FISH OIL) 1000 MG CAPS Take 1,000 mg by mouth daily.    Marland Kitchen omeprazole (PRILOSEC) 20 MG capsule Take 20 mg by mouth daily.    . pioglitazone (ACTOS) 15 MG tablet Take 15 mg by mouth daily.    . risedronate (ACTONEL) 150 MG tablet  Take 150 mg by mouth every 30 (thirty) days.    Marland Kitchen sertraline (ZOLOFT) 50 MG tablet Take 50 mg by mouth daily.     No current facility-administered medications for this encounter.      REVIEW OF SYSTEMS: On review of systems, the patient reports that she is doing well overall. She denies any chest pain, shortness of breath, cough, fevers, chills, night sweats, unintended weight changes. She denies any bowel or bladder disturbances, and denies abdominal pain, nausea or vomiting. She denies any new musculoskeletal or joint aches or pains. A complete review of systems is obtained and is otherwise negative.     PHYSICAL EXAM:  Wt Readings from Last 3 Encounters:  02/16/17 125 lb 11.2 oz (57 kg)  03/15/16 129 lb (58.5 kg)  05/30/15 123 lb 8 oz (56 kg)   Temp Readings from Last 3 Encounters:  02/16/17 97.9 F (36.6 C) (Oral)  07/09/16 97.9 F (36.6 C) (Oral)  05/30/15 98 F (36.7 C) (Oral)   BP Readings from Last 3 Encounters:  02/16/17 120/60  07/09/16 141/88  03/15/16 128/78   Pulse Readings from Last 3 Encounters:  02/16/17 (!) 55  07/09/16 106  03/15/16 62    /10  In general this is a well appearing caucasian female in no acute distress. She is alert and oriented x4 and appropriate throughout the examination. HEENT reveals that the patient is normocephalic, atraumatic. EOMs are intact. PERRLA. Skin is intact without any evidence of gross lesions. Cardiovascular exam reveals a regular rate and rhythm, no clicks rubs or murmurs are auscultated. Chest is clear to auscultation bilaterally. Lymphatic assessment is performed and does not reveal any adenopathy in the cervical, supraclavicular, axillary, or inguinal chains. Bilateral breast exam reveals no palpable masses in the right breast. The left reveals post biopsy changes with ecchymosis and induration deep to the biopsy site. No nipple bleeding or discharge is noted of either breast. Abdomen has active bowel sounds in all quadrants  and is intact. The abdomen is soft, non tender, non distended. Lower extremities are negative for pretibial pitting edema, deep calf tenderness, cyanosis or clubbing.   ECOG = 0  0 - Asymptomatic (Fully active, able to carry on all predisease activities without restriction)  1 - Symptomatic but completely ambulatory (Restricted in physically strenuous activity but ambulatory and able to carry out work of a light or sedentary nature. For example, light housework, office work)  2 - Symptomatic, <50% in bed during the day (Ambulatory and capable of all self care but unable to carry out any work activities. Up and about more than 50% of waking hours)  3 - Symptomatic, >50% in bed, but not bedbound (Capable of only limited self-care, confined to bed or chair 50% or more of waking hours)  4 - Bedbound (Completely disabled. Cannot carry on any self-care. Totally confined to bed or chair)  5 - Death   Eustace Pen MM, Creech RH, Tormey DC, et al. [phone]). "Toxicity and response criteria of the [facility]". [place] Oncol. 5 (6): 649-55    LABORATORY DATA:  Lab Results  Component Value Date   WBC 6.3 07/09/2016   HGB 14.5 07/09/2016   HCT 42.9 07/09/2016   MCV 80.8 07/09/2016   PLT 108 (L) 07/09/2016   Lab Results  Component Value Date   NA 136 07/09/2016   K 3.7 07/09/2016   CL 103 07/09/2016   CO2 21 (L) 07/09/2016   Lab Results  Component Value Date   ALT 39 07/09/2016   AST 40 07/09/2016   ALKPHOS 50 07/09/2016   BILITOT 0.6 07/09/2016      RADIOGRAPHY: No results found.     IMPRESSION/PLAN: 1. 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 Dr. Lisbeth Renshaw discusses the pathology findings and reviews the nature of invasive breast disease. The consensus from the breast conference include breast conservation with lumpectomy with sentinel mapping. Oncotype testing will be performed, and  provided that  chemotherapy is not indicated, the patient's course would then be followed by external radiotherapy to the breast followed by antiestrogen therapy. We discussed the risks, benefits, short, and long term effects of radiotherapy, and the patient is interested in proceeding. Dr. Lisbeth Renshaw discusses the delivery and logistics of radiotherapy, and would recommend a course of 4 weeks of treatment with deep inspiration breath hold. We will see her back about 2 weeks after surgery to move forward with the simulation and planning process and anticipate starting radiotherapy about 4 weeks after surgery.    The above documentation reflects my direct findings during this shared patient visit. Please see the separate note by Dr. Lisbeth Renshaw on this date for the remainder of the patient's plan of care.    Carola Rhine, PAC

## 2017-02-16 NOTE — Therapy (Signed)
[place], [place], [address] Phone: [phone]   Fax:  [phone]  Physical Therapy Evaluation  Patient Details  Name: Monica [name] MRN: [id number] Date of Birth: Feb 25, 1946 Referring Provider: Dr. [name]  Encounter Date: 02/16/2017      PT End of Session - 02/16/17 1415    Visit Number 1   Number of Visits 1   PT Start Time 1517   PT Stop Time 1405   PT Time Calculation (min) 30 min   Activity Tolerance Patient tolerated treatment well   Behavior During Therapy [facility] for tasks assessed/performed      Past Medical History:  Diagnosis Date  . CAD (coronary artery disease)    Cypher stent 09/2002  . CHF (congestive heart failure) ([place])   . Diabetes mellitus ([place])   . Hyperlipidemia   . Hypertension   . Myocardial infarction ([place])    2003,went into cardiac shock  . Osteoporosis   . Sinusitis     Past Surgical History:  Procedure Laterality Date  . ABDOMINAL HYSTERECTOMY    . APPENDECTOMY    . CARPAL TUNNEL RELEASE Bilateral   . COLONOSCOPY WITH PROPOFOL N/A 05/30/2015   Procedure: COLONOSCOPY WITH PROPOFOL;  Surgeon: Carol Ada, MD;  Location: [facility];  Service: Endoscopy;  Laterality: N/A;  . CORONARY ANGIOPLASTY WITH STENT PLACEMENT  10-02-2002  . Exploratory abdominal     Bleeding in abdomen after tubal  . TUBAL LIGATION    . UMBILICAL HERNIA REPAIR      There were no vitals filed for this visit.       Subjective Assessment - 02/16/17 1404    Subjective Patient reports she is here today to meet her medical team for her newly diagnosed left breast cancer.   Patient is accompained by: Family member   Pertinent History Patient was diagnosed on 01/28/17 with left grade 1-2 invasive ductal carcinoma breast cancer. It is ER/PR positive and HER2 negative with a Ki67 of 15%. It measures 9 mm and is located in the upper inner quadrant. She had a heart attack with stents  placed in 2003. She continues to smoke 2 packs per day.   Patient Stated Goals Reduce lymphedema risk and learn post op shoulder ROM HEP.   Currently in Pain? Yes   Pain Score --  Varies but it is high   Pain Location Knee   Pain Orientation Left   Pain Descriptors / Indicators Sharp   Pain Type Chronic pain   Pain Onset More than a month ago   Pain Frequency Intermittent   Aggravating Factors  Sit to stand   Pain Relieving Factors Sitting            [facility] PT Assessment - 02/16/17 0001      Assessment   Medical Diagnosis Left breast cancer   Referring Provider Dr. [name]   Onset Date/Surgical Date 01/28/17   Hand Dominance Left   Prior Therapy none     Precautions   Precautions Other (comment)   Precaution Comments active cancer; hx of MI     Restrictions   Weight Bearing Restrictions No     Balance Screen   Has the patient fallen in the past 6 months No   Has the patient had a decrease in activity level because of a fear of falling?  No   Is the patient reluctant to leave their home because of a fear of falling?  No  Home Environment   Living Environment Private residence   Living Arrangements Spouse/significant other   Available Help at Discharge Family     Prior Function   Level of [place] Retired   Leisure She does not exercise     Cognition   Overall Cognitive Status Within Functional Limits for tasks assessed     Posture/Postural Control   Posture/Postural Control Postural limitations   Postural Limitations Rounded Shoulders;Forward head;Increased thoracic kyphosis  Posture limits shoulder active flexion and cervical AROM     ROM / Strength   AROM / PROM / Strength AROM;Strength     AROM   AROM Assessment Site Shoulder;Cervical   Right/Left Shoulder Right;Left   Right Shoulder Extension 48 Degrees   Right Shoulder Flexion 130 Degrees   Right Shoulder ABduction 166 Degrees   Right Shoulder Internal Rotation  61 Degrees   Right Shoulder External Rotation 88 Degrees   Left Shoulder Extension 60 Degrees   Left Shoulder Flexion 138 Degrees   Left Shoulder ABduction 147 Degrees   Left Shoulder Internal Rotation 66 Degrees   Left Shoulder External Rotation 83 Degrees   Cervical Flexion WNL   Cervical Extension 50% limited   Cervical - Right Side Bend 75% limited   Cervical - Left Side Bend 75% limited   Cervical - Right Rotation 50% limited   Cervical - Left Rotation 50% limited     Strength   Overall Strength Within functional limits for tasks performed           LYMPHEDEMA/ONCOLOGY QUESTIONNAIRE - 02/16/17 1414      Type   Cancer Type Left breast cancer     Lymphedema Assessments   Lymphedema Assessments Upper extremities     Right Upper Extremity Lymphedema   10 cm Proximal to Olecranon Process 22.5 cm   Olecranon Process 21.8 cm   10 cm Proximal to Ulnar Styloid Process 19 cm   Just Proximal to Ulnar Styloid Process 14.7 cm   Across Hand at [employer] 18 cm   At [place] of 2nd Digit 6.3 cm     Left Upper Extremity Lymphedema   10 cm Proximal to Olecranon Process 22.2 cm   Olecranon Process 21.9 cm   10 cm Proximal to Ulnar Styloid Process 18.6 cm   Just Proximal to Ulnar Styloid Process 14.5 cm   Across Hand at [employer] 18.8 cm   At [place] of 2nd Digit 6.8 cm       Patient was instructed today in a home exercise program today for post op shoulder range of motion. These included active assist shoulder flexion in sitting, scapular retraction, wall walking with shoulder abduction, and hands behind head external rotation.  She was encouraged to do these twice a day, holding 3 seconds and repeating 5 times when permitted by her physician.         PT Education - 02/16/17 1415    Education provided Yes   Education Details Lymphedema risk reduction and post op shoulder ROM HEP   Person(s) Educated Patient;Spouse;Child(ren)   Methods  Explanation;Demonstration;Handout   Comprehension Returned demonstration;Verbalized understanding              Breast Clinic Goals - 02/16/17 1419      Patient will be able to verbalize understanding of pertinent lymphedema risk reduction practices relevant to her diagnosis specifically related to skin care.   Time 1   Period Days   Status Achieved  Patient will be able to return demonstrate and/or verbalize understanding of the post-op home exercise program related to regaining shoulder range of motion.   Time 1   Period Days   Status Achieved     Patient will be able to verbalize understanding of the importance of attending the postoperative After Breast Cancer Class for further lymphedema risk reduction education and therapeutic exercise.   Time 1   Period Days   Status Achieved              Plan - 02-25-2017 1416    Clinical Impression Statement Patient was diagnosed on 01/28/17 with left grade 1-2 invasive ductal carcinoma breast cancer. It is ER/PR positive and HER2 negative with a Ki67 of 15%. It measures 9 mm and is located in the upper inner quadrant. She had a heart attack with stents placed in 2003. She continues to smoke 2 packs per day. Her multidisciplinary medical team met prior to her assessments to determine a recommended treatment plan. She is planning to have a left lumpectomy and sentinel node biopsy followed by radiation and anti-estrogen therapy. She may benefit from post op PT to regain shoulder RM and reduce lymphedema risk. She was encouraged to work on her shoulder ROM pre-operatively to improve outcomes and mobility after surgery. Her eval is of low complexity.   Rehab Potential Good   Clinical Impairments Affecting Rehab Potential none   PT Frequency One time visit   PT Treatment/Interventions Therapeutic exercise;Patient/family education   PT Next Visit Plan Will f/u after surgery to determine PT needs   PT Home Exercise Plan Post op shoulder  ROM HEP   Consulted and Agree with Plan of Care Patient;Family member/caregiver   Family Member Consulted Husband, daughter      Patient will benefit from skilled therapeutic intervention in order to improve the following deficits and impairments:  Decreased range of motion, Impaired UE functional use, Decreased knowledge of precautions, Postural dysfunction, Pain  Visit Diagnosis: Carcinoma of upper-inner quadrant of left breast in female, estrogen receptor positive ([place]) - Plan: PT plan of care cert/re-cert  Abnormal posture - Plan: PT plan of care cert/re-cert  Patient will follow up at outpatient cancer rehab if needed following surgery.  If the patient requires physical therapy at that time, a specific plan will be dictated and sent to the referring physician for approval. The patient was educated today on appropriate basic range of motion exercises to begin post operatively and the importance of attending the After Breast Cancer class following surgery.  Patient was educated today on lymphedema risk reduction practices as it pertains to recommendations that will benefit the patient immediately following surgery.  She verbalized good understanding.  No additional physical therapy is indicated at this time.        G-Codes - 2017/02/25 1420    Functional Assessment Tool Used (Outpatient Only) Clinical Judgement   Functional Limitation Other PT primary   Other PT Primary Current Status (G8366) At least 1 percent but less than 20 percent impaired, limited or restricted   Other PT Primary Goal Status (Q9476) At least 1 percent but less than 20 percent impaired, limited or restricted   Other PT Primary Discharge Status (L4650) At least 1 percent but less than 20 percent impaired, limited or restricted       Problem List Patient Active Problem List   Diagnosis Date Noted  . Malignant neoplasm of upper-inner quadrant of left breast in female, estrogen receptor positive ([place]) 02/15/2017  .  CAD (coronary artery disease) 03/12/2015    Annia Friendly, PT 02/16/17 2:22 PM  [place], [place], [address] Phone: [phone]   Fax:  [phone]  Name: Monica [name] MRN: [id number] Date of Birth: 05/17/1946

## 2017-02-16 NOTE — Progress Notes (Signed)
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    Malignant neoplasm of upper-inner quadrant of left breast in female, estrogen receptor positive (HCC)   02/09/2017 Receptors her2    Estrogen Receptor: 100%, POSITIVE, STRONG STAINING INTENSITY Progesterone Receptor: 15%, POSITIVE, STRONG STAINING INTENSITY Proliferation Marker Ki67: 15% HER2 (-)      02/09/2017 Initial Diagnosis    Malignant neoplasm of upper-inner quadrant of left breast in female, estrogen receptor positive (HCC)      02/09/2017 Initial Biopsy    Diagnosis Breast, left, needle core biopsy - INVASIVE DUCTAL CARCINOMA, G1-2 - DUCTAL CARCINOMA IN SITU        HISTORY OF PRESENTING ILLNESS:  Monica [name] 71 y.o. female is here because of newly diagnosed left breast cancer. She is accompanied by her multiple family members to our disciplinary breast clinic today.  This was found by a mammogram screening of the left breast. She had a abnormal benign mammogram 35 years ago and had surgery to remove the mass form the left breast. She denies feeling a lump herself and denies any change lately. She reports having a fair appetite and no weight loss. She ranges 124/127 pounds. She denies and pain and aches but has pain in her left knee joint but tolerable as well in her hip. She takes tylenol for pain. She has a history of DM and follows up with Dr. Selena Batten. Her sugar has been running high lightly. Had a CHF and MI in 2003 and has a stent.She denies chest pain and her last echo was in 2016 and is normal. She has  not had a stroke. No history of breast cancer in family. She still smokes.   Her most exercise is walking her dog and she remain moving around the house. She will see her PCP next week   GYN HISTORY  Menarchal: 11 LMP: many years ago, not sure  Contraceptive: none  HRT: none  G3P3: She was 25 at first live births.    MEDICAL HISTORY:  Past Medical History:  Diagnosis Date  . CAD (coronary artery disease)    Cypher stent 09/2002  . CHF (congestive heart failure) (HCC)   . Diabetes mellitus (HCC)   . Hyperlipidemia   . Hypertension   . Myocardial infarction (HCC)    2003,went into cardiac shock  . Osteoporosis   . Sinusitis     SURGICAL HISTORY: Past Surgical History:  Procedure Laterality Date  . ABDOMINAL HYSTERECTOMY    . APPENDECTOMY    . CARPAL TUNNEL RELEASE Bilateral   . COLONOSCOPY WITH PROPOFOL N/A 05/30/2015   Procedure: COLONOSCOPY WITH PROPOFOL;  Surgeon: Jeani Hawking, MD;  Location: [facility];  Service: Endoscopy;  Laterality: N/A;  . CORONARY ANGIOPLASTY WITH STENT PLACEMENT  10-02-2002  . Exploratory abdominal     Bleeding in abdomen after tubal  . TUBAL LIGATION    . UMBILICAL HERNIA REPAIR      SOCIAL HISTORY: Social History   Social History  . Marital status: Married    Spouse name: N/A  . Number of children: 3  . Years of education: N/A   Occupational History  .  Not on file.   Social History Main Topics  . Smoking status: Current Some Day Smoker    Packs/day: 2.00    Years: 30.00    Types: Cigarettes  . Smokeless tobacco: Current User     Comment: e cig  . Alcohol use No  . Drug use: No  . Sexual activity: Not on file   Other Topics Concern  . Not on file   Social History Narrative   Lives with husband.     FAMILY HISTORY: Family History  Problem Relation Age of Onset  . Dementia Mother   . Kidney disease Mother   . Heart attack Father 27    Died age 74  . Heart attack Brother 61  . Heart attack Brother 45     ALLERGIES:  is allergic to ace inhibitors; altace [ramipril]; keflex [cephalexin]; phenobarbital; and sulfate.  MEDICATIONS:  Current Outpatient Prescriptions  Medication Sig Dispense Refill  . acetaminophen (TYLENOL) 500 MG tablet Take 1,000 mg by mouth every 6 (six) hours as needed for moderate pain or headache.    Marland Kitchen aspirin 81 MG tablet Take 81 mg by mouth daily.    Marland Kitchen atorvastatin (LIPITOR) 10 MG tablet Take 10 mg by mouth daily.     . Calcium Carbonate-Vitamin D 600-400 MG-UNIT per tablet Take 1 tablet by mouth 2 (two) times daily.    . carvedilol (COREG) 3.125 MG tablet Take 3.125 mg by mouth 2 (two) times daily with a meal.    . clonazePAM (KLONOPIN) 1 MG tablet Take 1 mg by mouth daily.    Marland Kitchen glimepiride (AMARYL) 4 MG tablet Take 4 mg by mouth 2 (two) times daily.    . halobetasol (ULTRAVATE) 0.05 % cream Apply 1 application topically daily as needed (rash on leg).     . Insulin Detemir (LEVEMIR) 100 UNIT/ML Pen Inject 30 Units into the skin every morning.     . Insulin Lispro, Human, (HUMALOG KWIKPEN) 200 UNIT/ML SOPN Inject 3-5 Units into the skin 3 (three) times daily. 6 units in the morning, 6 units in the afternoon, 9 units at bedtime    . losartan (COZAAR) 50 MG tablet Take 50 mg by mouth daily.    . metFORMIN (GLUCOPHAGE) 500 MG tablet Take by mouth 2 (two) times daily with a meal.    . Multiple Vitamin (MULTIVITAMIN) tablet Take 1 tablet by mouth daily.    . Omega-3 Fatty Acids (FISH OIL) 1000 MG CAPS Take 1,000 mg by mouth daily.    Marland Kitchen omeprazole (PRILOSEC) 20 MG capsule Take 20 mg by mouth daily.    . pioglitazone (ACTOS) 15 MG tablet Take 15 mg by mouth daily.    . risedronate (ACTONEL) 150 MG tablet Take 150 mg by mouth every 30 (thirty) days.    Marland Kitchen sertraline (ZOLOFT) 50 MG tablet Take 100 mg by mouth daily.     Marland Kitchen JANUVIA 100 MG tablet Take 100 mg by mouth daily.    Glory Rosebush DELICA LANCETS 40X MISC     . ONETOUCH VERIO test strip      No current  facility-administered medications for this visit.     REVIEW OF SYSTEMS:   Constitutional: Denies fevers, chills or abnormal night sweats (+) fair appetite Eyes: Denies blurriness of vision, double vision or watery eyes Ears, nose, mouth, throat, and face: Denies mucositis or sore throat Respiratory: Denies cough, dyspnea or wheezes Cardiovascular: Denies palpitation, chest discomfort or lower extremity swelling Gastrointestinal:  Denies nausea, heartburn or change  in bowel habits MSK: (+) occasional knee joint pain Skin: Denies abnormal skin rashes Lymphatics: Denies new lymphadenopathy or easy bruising Neurological:Denies numbness, tingling or new weaknesses Behavioral/Psych: Mood is stable, no new changes  All other systems were reviewed with the patient and are negative.  PHYSICAL EXAMINATION: ECOG PERFORMANCE STATUS: 0 - Asymptomatic  Vitals:   02/16/17 1258  BP: 120/60  Pulse: (!) 55  Resp: 18  Temp: 97.9 F (36.6 C)   Filed Weights   02/16/17 1258  Weight: 125 lb 11.2 oz (57 kg)    GENERAL:alert, no distress and comfortable SKIN: skin color, texture, turgor are normal, no rashes or significant lesions EYES: normal, conjunctiva are pink and non-injected, sclera clear OROPHARYNX:no exudate, no erythema and lips, buccal mucosa, and tongue normal  NECK: supple, thyroid normal size, non-tender, without nodularity LYMPH:  no palpable lymphadenopathy in the cervical, axillary or inguinal LUNGS: clear to auscultation and percussion with normal breathing effort HEART: regular rate & rhythm and no murmurs and no lower extremity edema ABDOMEN: abdomen soft, non-tender and normal bowel sounds Musculoskeletal:no cyanosis of digits and no clubbing  PSYCH: alert & oriented x 3 with fluent speech NEURO: no focal motor/sensory deficits Breasts: Breast inspection showed them to be symmetrical with no nipple discharge. Palpation of the breasts and axilla revealed no obvious mass that  I could appreciate. (+)  bruise in middle upper left breast and a pea sizes lump likely related to biopsy, no other palpable mass.   LABORATORY DATA:  I have reviewed the data as listed CBC Latest Ref Rng & Units 07/09/2016  WBC 4.0 - 10.5 K/uL 6.3  Hemoglobin 12.0 - 15.0 g/dL 97.5  Hematocrit 38.7 - 46.0 % 42.9  Platelets 150 - 400 K/uL 108(L)    CMP Latest Ref Rng & Units 07/09/2016  Glucose 65 - 99 mg/dL 235(N)  BUN 6 - 20 mg/dL 12  Creatinine 5.02 - 6.80 mg/dL 6.14  Sodium 149 - 243 mmol/L 136  Potassium 3.5 - 5.1 mmol/L 3.7  Chloride 101 - 111 mmol/L 103  CO2 22 - 32 mmol/L 21(L)  Calcium 8.9 - 10.3 mg/dL 9.7  Total Protein 6.5 - 8.1 g/dL 6.8  Total Bilirubin 0.3 - 1.2 mg/dL 0.6  Alkaline Phos 38 - 126 U/L 50  AST 15 - 41 U/L 40  ALT 14 - 54 U/L 39     RADIOGRAPHIC STUDIES: I have personally reviewed the radiological images as listed and agreed with the findings in the report. No results found.  ASSESSMENT & PLAN:  INDONESIA MCKEOUGH is 71 y.o. caucasian female who has a history of an MI, CHF and DM. She is here for her newly diagnosed Breast Cancer  1. 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 -Giving the early stage disease, she likely need a candidate for lumpectomy and sentinel lymph node biopsy. She is agreeable with that. She was seen by Dr. Derrell Lolling today and likely will proceed with surgery soon.  -I recommend a Oncotype Dx test on the surgical sample if her tumor is >82mm and G2/3 or >1.0cm and G1, and we'll make a decision about adjuvant chemotherapy based on the Oncotype result. Written material of this test was given to her. She would be a good candidate for chemotherapy if her Oncotype recurrence score is high. -If her surgical sentinel lymph node node positive, I  recommend mammaprint for further risk  stratification and guide adjuvant chemotherapy. -Giving the strong ER and PR expression in her postmenopausal status, I recommend adjuvant endocrine therapy with aromatase inhibitor for a total of 5-10 years to reduce the risk of cancer recurrence. Potential benefits and side effects were discussed with patient and she is interested. -She was also seen by radiation oncologist Dr. Lisbeth Renshaw today. She will be a candidate for adjuvant breast radiation after lumpectomy. -We also discussed the breast cancer surveillance after her surgery. She will continue annual screening mammogram, self exam, and a routine office visit with lab and exam with [place]. -I encouraged her to have healthy diet and exercise regularly.      2. DM, poorly controlled -uncontrolled, she is somewhat oral medication and insulin -Follows up with Dr. Maudie Mercury her PCP  3. Mild thrombocytopenia -She has long-standing history of mild pancytopenia, related around 100, no history of significant bleeding.  -No history of liver disease or alcohol abuse. 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 -We'll continue monitoring.  PLAN: -She'll proceed with lumpectomy and sentinel lymph node biopsy by Dr. Dalbert Batman in the near future -Oncotype Dx test on the surgical sample if her tumor is >61m and G2/3 or >1.0cm and G1 -I'll see her after she completes adjuvant breast radiation, or sooner if her Oncotype shows high risk disease.  No orders of the defined types were placed in this encounter.   All questions were answered. The patient knows to call the clinic with any problems, questions or concerns. I spent 55 minutes counseling the patient face to face. The total time spent in the appointment was 60 minutes and more than 50% was on counseling.  This document serves as a record of services personally performed by YTruitt Merle MD. It was created on her behalf by AJoslyn Devon a trained medical scribe. The creation of this record is based on the scribe's personal observations  and the provider's statements to them. This document has been checked and approved by the attending provider.     FTruitt Merle MD 02/16/2017 5:48 PM

## 2017-02-16 NOTE — Progress Notes (Signed)
Nutrition Assessment  Reason for Assessment:  Pt seen in Breast Clinic  ASSESSMENT:   71 year old female with new diagnosis of left breast cancer.  Past medical history of heart attack, CHF, cardiac shock, DM  Patient reports normal appetite and stable weight  Medications:  reviewed  Labs: reviewed  Anthropometrics:   Height: 65 inches Weight: 125 lb BMI: 21   NUTRITION DIAGNOSIS: Food and nutrition related knowledge deficit related to new diagnosis of breast cancer as evidenced by no prior need for nutrition related information.  INTERVENTION:   Discussed and provided packet of information regarding nutritional tips for breast cancer patients.  Questions answered.  Teachback method used.  Contact information provided and patient knows to contact me with questions/concerns.    MONITORING, EVALUATION, and GOAL: Pt will consume a healthy plant based diet to maintain lean body mass throughout treatment.   Monica Bua B. Monica [name], [place], [place] Registered Dietitian [phone] (pager)

## 2017-02-16 NOTE — Progress Notes (Signed)
[facility] Psychosocial Distress Screening Spiritual Care  Shadowed by doctoral counseling intern Becca Cash/MS, LPCA, [place], met with Monica [name], husband, daughter Monica [name], and DIL [name] in [facility] to introduce [facility] team/resources, reviewing distress screen per protocol.  The patient scored a 0 on the Psychosocial Distress Thermometer which indicates minimal distress. Also assessed for distress and other psychosocial needs.   ONCBCN DISTRESS SCREENING 02/16/2017  Screening Type Initial Screening  Distress experienced in past week (1-10) 0  Practical problem type Insurance  Emotional problem type Depression;Nervousness/Anxiety  Spiritual/Religous concerns type Relating to God  Physical Problem type Sexual problems  Referral to support programs Yes   Pt reported little to no distress, stating, "If I can't control it, I'm not going to worry about it." Pt/family verbalized appreciation for [facility] team/programming resources. Normalized variety of feelings--for pt and for family--through cancer dx/tx, which seemed particularly helpful for dtr.   Follow up needed: No. Per pt, no other needs or concerns at this time. Pt and family know to contact Support Team whenever desired and have full packet of programming resources. Please also page if immediate needs arise. Thank you.   [place], [place], [facility] Pager [phone] Voicemail [phone]

## 2017-02-21 ENCOUNTER — Telehealth: Payer: Self-pay | Admitting: *Deleted

## 2017-02-21 NOTE — Telephone Encounter (Signed)
Spoke to pt concerning [place] from 02/16/17. Denies questions or concerns regarding dx or treatment care plan. Discussed after Dr. Darrel Hoover office receives cardiac clearance she will receive a call for sx to be scheduled. Received verbal understanding. Denies needs at this time.

## 2017-02-21 NOTE — Telephone Encounter (Signed)
Request for surgical clearance:  1. What type of surgery is being performed? Left Breast Lumpectomy  2. When is this surgery scheduled?  TBD on written clearance  Are there any medications that need to be held prior to surgery and how long?  ASA - with request regarding Lovenox bridging  4. Name of physician performing surgery? Dr. [name]  5. What is your office phone and fax number?    Phone: [phone]  Fax: [phone]

## 2017-02-22 NOTE — Telephone Encounter (Signed)
OK to hold ASA.  No bridging is necessary

## 2017-02-23 NOTE — Telephone Encounter (Signed)
Clearance send to Dr Dalbert Batman and [place] via Epic

## 2017-03-09 ENCOUNTER — Encounter (HOSPITAL_COMMUNITY): Payer: Self-pay

## 2017-03-09 ENCOUNTER — Encounter (HOSPITAL_COMMUNITY)
Admission: RE | Admit: 2017-03-09 | Discharge: 2017-03-09 | Disposition: A | Discharge status: Home / Self Care | Payer: Medicare Other | Source: Ambulatory Visit | Attending: General Surgery | Admitting: General Surgery

## 2017-03-09 DIAGNOSIS — E119 Type 2 diabetes mellitus without complications: Secondary | ICD-10-CM | POA: Diagnosis not present

## 2017-03-09 DIAGNOSIS — C50212 Malignant neoplasm of upper-inner quadrant of left female breast: Secondary | ICD-10-CM | POA: Diagnosis not present

## 2017-03-09 DIAGNOSIS — Z01812 Encounter for preprocedural laboratory examination: Secondary | ICD-10-CM | POA: Diagnosis not present

## 2017-03-09 LAB — CBC
HCT: 45.3 % (ref 36.0–46.0)
HEMOGLOBIN: 14.8 g/dL (ref 12.0–15.0)
MCH: 26.4 pg (ref 26.0–34.0)
MCHC: 32.7 g/dL (ref 30.0–36.0)
MCV: 80.7 fL (ref 78.0–100.0)
PLATELETS: 121 10*3/uL — AB (ref 150–400)
RBC: 5.61 MIL/uL — ABNORMAL HIGH (ref 3.87–5.11)
RDW: 14.8 % (ref 11.5–15.5)
WBC: 6.7 10*3/uL (ref 4.0–10.5)

## 2017-03-09 LAB — BASIC METABOLIC PANEL
Anion gap: 10 (ref 5–15)
BUN: <5 mg/dL — ABNORMAL LOW (ref 6–20)
CHLORIDE: 100 mmol/L — AB (ref 101–111)
CO2: 25 mmol/L (ref 22–32)
Calcium: 10 mg/dL (ref 8.9–10.3)
Creatinine, Ser: 0.5 mg/dL (ref 0.44–1.00)
GFR calc Af Amer: >60 mL/min (ref >60)
GFR calc non Af Amer: >60 mL/min (ref >60)
Glucose, Bld: 113 mg/dL — ABNORMAL HIGH (ref 65–99)
POTASSIUM: 4 mmol/L (ref 3.5–5.1)
SODIUM: 135 mmol/L (ref 135–145)

## 2017-03-09 LAB — GLUCOSE, CAPILLARY: Glucose-Capillary: 118 mg/dL — ABNORMAL HIGH (ref 65–99)

## 2017-03-09 NOTE — Pre-Procedure Instructions (Signed)
Monica [name]  03/09/2017      [facility] [address] [place] [address] [place] [place] [address] Phone: [phone] Fax: [phone]    Your procedure is scheduled on June 6.  Report to [facility] Admitting at 8:30 A.M.  Call this number if you have problems the morning of surgery:  [phone]   Remember:  Do not eat food or drink liquids after midnight.  Take these medicines the morning of surgery with A SIP OF WATER :carvedilol (COREG)   STOP aspirin, herbal medications, fish oil, vitamins, NSAIDS- aleve, advil, ibuprofen as of today      How to Manage Your Diabetes Before and After Surgery  Why is it important to control my blood sugar before and after surgery? . Improving blood sugar levels before and after surgery helps healing and can limit problems. . A way of improving blood sugar control is eating a healthy diet by: o  Eating less sugar and carbohydrates o  Increasing activity/exercise o  Talking with your doctor about reaching your blood sugar goals . High blood sugars (greater than 180 mg/dL) can raise your risk of infections and slow your recovery, so you will need to focus on controlling your diabetes during the weeks before surgery. . Make sure that the doctor who takes care of your diabetes knows about your planned surgery including the date and location.  How do I manage my blood sugar before surgery? . Check your blood sugar at least 4 times a day, starting 2 days before surgery, to make sure that the level is not too high or low. o Check your blood sugar the morning of your surgery when you wake up and every 2 hours until you get to the Short Stay unit. . If your blood sugar is less than 70 mg/dL, you will need to treat for low blood sugar: o Do not take insulin. o Treat a low blood sugar (less than 70 mg/dL) with  cup of clear juice (cranberry or apple), 4 glucose tablets, OR glucose  gel. o Recheck blood sugar in 15 minutes after treatment (to make sure it is greater than 70 mg/dL). If your blood sugar is not greater than 70 mg/dL on recheck, call [phone] for further instructions. . Report your blood sugar to the short stay nurse when you get to Short Stay.  . If you are admitted to the hospital after surgery: o Your blood sugar will be checked by the staff and you will probably be given insulin after surgery (instead of oral diabetes medicines) to make sure you have good blood sugar levels. o The goal for blood sugar control after surgery is 80-180 mg/dL.            WHAT DO I DO ABOUT MY DIABETES MEDICATION?   Marland Kitchen Do not take oral diabetes medicines (pills) the morning of surgery.  . THE NIGHT BEFORE SURGERY, take ___8________ units of ___levemir________insulin.       Marland Kitchen HE MORNING OF SURGERY, take ____12________ units of __levemir________insulin.  . The day of surgery, do not take other diabetes injectables, including Byetta (exenatide), Bydureon (exenatide ER), Victoza (liraglutide), or Trulicity (dulaglutide).  . If your CBG is greater than 220 mg/dL, you may take  of your sliding scale (correction) dose of insulin.  Other Instructions:          Patient Signature:  Date:   Nurse Signature:  Date:   Reviewed and Endorsed by  [facility] Health Patient Education Committee, August 2015   Do not wear jewelry, make-up or nail polish.  Do not wear lotions, powders, or perfumes, or deoderant.  Do not shave 48 hours prior to surgery.  Men may shave face and neck.  Do not bring valuables to the hospital.  [facility] is not responsible for any belongings or valuables.  Contacts, dentures or bridgework may not be worn into surgery.  Leave your suitcase in the car.  After surgery it may be brought to your room.  For patients admitted to the hospital, discharge time will be determined by your treatment team.  Patients discharged the day of surgery will  not be allowed to drive home.   Name and phone number of your driver:    Special instructions:  Preparing for surgery  Please read over the following fact sheets that you were given. Pain Booklet and Surgical Site Infection Prevention

## 2017-03-10 LAB — HEMOGLOBIN A1C
Hgb A1c MFr Bld: 8.5 % — ABNORMAL HIGH (ref 4.8–5.6)
MEAN PLASMA GLUCOSE: 197 mg/dL

## 2017-03-10 NOTE — Progress Notes (Signed)
Anesthesia Chart Review:  Pt is a 71 year old female scheduled for L breast lumpectomy with radioactive seed and L axillary deep sentinel node biopsy, inject blue dye L breast on 03/16/2017 with [name], M.D.  - PCP is Jani Gravel, MD - Cardiologist is Minus Breeding, MD who has cleared pt for surgery.   PMH includes: CAD (stent tp LAD 2003), CHF, HTN, DM, hyperlipidemia. Former smoker. BMI 21.  Medications include: ASA 81 mg, Lipitor, carvedilol, glimepiride, Levemir, Januvia, losartan, metformin, Prilosec, pioglitazone  Preoperative labs reviewed. HbA1c 8.5, glucose 113.  CXR 07/09/16: Mild bronchitic changes.  EKG 07/09/16: Sinus rhythm. IVCD, consider atypical RBBB  Echo 04/11/15:  - Left ventricle: The cavity size was normal. Wall thickness was increased in a pattern of moderate LVH. Systolic function was normal. The estimated ejection fraction was in the range of 60% to 65%. Wall motion was normal; there were no regional wall motion abnormalities. Doppler parameters are consistent with abnormal left ventricular relaxation (grade 1 diastolic dysfunction). The E/e&' ratio is between 8-15, suggesting indeterminate LV filling pressure. - Aortic valve: Trileaflet. Sclerosis without stenosis. There was trivial regurgitation. - Mitral valve: Mildly thickened leaflets . There was trivial regurgitation. - Left atrium: The atrium was normal in size. - Atrial septum: There was increased thickness of the septum, consistent with lipomatous hypertrophy. - Inferior vena cava: The vessel was normal in size. The respirophasic diameter changes were in the normal range (= 50%), consistent with normal central venous pressure.  Exercise tolerance test 04/11/15:  - ETT with good exercise capacity (8:36); no chest pain; baseline RBBB; no ST changes with exercise; normal BP response; patient achieved 80% THR (on beta blocker); negative inadequate ETT; Duke TM score 2.   If no changes, I anticipate pt can  proceed with surgery as scheduled.   Willeen Cass, FNP-BC [facility] Short Stay Surgical Center/Anesthesiology Phone: [phone] 03/10/2017 11:28 AM

## 2017-03-14 NOTE — H&P (Signed)
Monica [name] Location: [facility] Patient #: [id number] DOB: Jul 07, 1946 Undefined / Language: Monica [name] / Race: White Female        History of Present Illness      This is a 71 year old female, referred by Dr. Christene Slates at [facility] mammography for evaluation and management of a newly diagnosed cancer of the left breast at the 11:00 position. She is seen in the [facility] today by Dr. Burr Medico, Dr. Lisbeth Renshaw, and me. Dr. Jani Gravel is her PCP. Dr. Percival Spanish is her cardiologist.      She gets annual screening mammograms. Recent imaging studies show a 9 mm mass in the left breast, upper inner quadrant, 11:00 position. Axilla is negative by ultrasound. Low breast density. MRI does not seem indicated. 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 Ki-67 15%.      Past history reveals a left breast biopsy 35 years ago by Lennie Hummer for benign disease. Coronary artery disease with myocardial infarction in 2003 and she has a stent in place. Takes aspirin but no other anticoagulants. Hypertension. Hyperlipidemia. Insulin-dependent diabetes mellitus. GERD. Had a tubal ligation with postop hemorrhage and had a laparotomy for hemorrhage. Appendectomy. Umbilical hernia repair with mesh.      Family history negative for breast or ovarian cancer. Mother died of dementia and kidney disease. Father died of myocardial infarction. 2 brothers with myocardial infarction.      Social history reveals she is married. Husband and daughter with her today. Smokes 2 packs of cigarettes per day. Denies alcohol. Retired from [employer] at [employer].      We had a long talk. We discussed mastectomy with or without reconstruction, lumpectomy with radioactive seed, sentinel node biopsy. She is strongly motivated for breast conservation surgery and I think that would be best for her. Assuming all goes well with her lumpectomy she will receive adjuvant whole  breast radiation therapy. Dr. Burr Medico plans Oncotype testing.      She will be scheduled for left breast lumpectomy with radioactive seed localization. Left axillary deep sentinel node biopsy. I discussed the indications, details, techniques, and numerous risk of the surgery with her and her family. She is aware of the risk of bleeding, infection, reoperation for positive margins or positive nodes, cosmetic deformity, nerve damage with chronic pain. She understands all these issues. All of her questions are answered. She agrees with this plan.      We will ask Dr. Percival Spanish to provide cardiac risk assessment and clearance preop. Otherwise we are ready to proceed with surgery   Past Surgical History  Breast Biopsy  Left. Hysterectomy (not due to cancer) - Complete   Diagnostic Studies History  Colonoscopy  1-5 years ago Mammogram  within last year Pap Smear  >5 years ago  Medication History  Medications Reconciled  Social History No alcohol use  No caffeine use  No drug use  Tobacco use  Current every day smoker.  Family History Alcohol Abuse  Brother. Arthritis  Mother. Depression  Sister. Heart Disease  Brother, Father. Heart disease in female family member before age 27   Pregnancy / Birth History  Age at menarche  34 years. Age of menopause  [age] Contraceptive History  Oral contraceptives. Gravida  3 Maternal age  5-30 Para  3  Other Problems  Anxiety Disorder  Congestive Heart Failure  Depression  Diabetes Mellitus  Lump In Breast  Myocardial infarction  Seizure Disorder  Review of Systems  General Present- Fatigue. Not Present- Appetite Loss, Chills, Fever, Night Sweats, Weight Gain and Weight Loss. Skin Present- Dryness. Not Present- Change in Wart/Mole, Hives, Jaundice, New Lesions, Non-Healing Wounds, Rash and Ulcer. HEENT Present- Seasonal Allergies and Wears glasses/contact lenses. Not Present- Earache, Hearing Loss,  Hoarseness, Nose Bleed, Oral Ulcers, Ringing in the Ears, Sinus Pain, Sore Throat, Visual Disturbances and Yellow Eyes. Respiratory Not Present- Bloody sputum, Chronic Cough, Difficulty Breathing, Snoring and Wheezing. Breast Present- Breast Mass. Not Present- Breast Pain, Nipple Discharge and Skin Changes. Cardiovascular Not Present- Chest Pain, Difficulty Breathing Lying Down, Leg Cramps, Palpitations, Rapid Heart Rate, Shortness of Breath and Swelling of Extremities. Gastrointestinal Present- Excessive gas. Not Present- Abdominal Pain, Bloating, Bloody Stool, Change in Bowel Habits, Chronic diarrhea, Constipation, Difficulty Swallowing, Gets full quickly at meals, Hemorrhoids, Indigestion, Nausea, Rectal Pain and Vomiting. Female Genitourinary Present- Frequency and Urgency. Not Present- Nocturia, Painful Urination and Pelvic Pain. Musculoskeletal Present- Muscle Pain. Not Present- Back Pain, Joint Pain, Joint Stiffness, Muscle Weakness and Swelling of Extremities. Neurological Not Present- Decreased Memory, Fainting, Headaches, Numbness, Seizures, Tingling, Tremor, Trouble walking and Weakness. Psychiatric Present- Anxiety and Depression. Not Present- Bipolar, Change in Sleep Pattern, Fearful and Frequent crying. Endocrine Present- Heat Intolerance. Not Present- Cold Intolerance, Excessive Hunger, Hair Changes, Hot flashes and New Diabetes. Hematology Present- Blood Thinners. Not Present- Easy Bruising, Excessive bleeding, Gland problems, HIV and Persistent Infections.   Physical Exam  General Mental Status-Alert. General Appearance-Consistent with stated age. Hydration-Well hydrated. Voice-Normal.  Head and Neck Head-normocephalic, atraumatic with no lesions or palpable masses. Trachea-midline. Thyroid Gland Characteristics - normal size and consistency.  Eye Eyeball - Bilateral-Extraocular movements intact. Sclera/Conjunctiva - Bilateral-No scleral icterus.  Chest  and Lung Exam Chest and lung exam reveals -quiet, even and easy respiratory effort with no use of accessory muscles and on auscultation, normal breath sounds, no adventitious sounds and normal vocal resonance. Inspection Chest Wall - Normal. Back - normal.  Breast Note: Breasts are small. 3-4 cm transverse incision left breast, lower inner quadrant near inframammary crease from remote biopsy. 1 cm palpable mass left breast at 11:00 position. Cannot tell if this is the tumor or just a hematoma from the biopsy. No other mass or skin change in either breast. No axillary adenopathy.   Cardiovascular Cardiovascular examination reveals -normal heart sounds, regular rate and rhythm with no murmurs and normal pedal pulses bilaterally.  Abdomen Inspection Inspection of the abdomen reveals - No Hernias. Skin - Scar - Note: Transverse scar above umbilicus. Short lower midline scar below umbilicus. Lots of sutures palpable around her umbilicus presumably from mesh repair. No organomegaly. Palpation/Percussion Palpation and Percussion of the abdomen reveal - Soft, Non Tender, No Rebound tenderness, No Rigidity (guarding) and No hepatosplenomegaly. Auscultation Auscultation of the abdomen reveals - Bowel sounds normal.  Neurologic Neurologic evaluation reveals -alert and oriented x 3 with no impairment of recent or remote memory. Mental Status-Normal.  Musculoskeletal Normal Exam - Left-Upper Extremity Strength Normal and Lower Extremity Strength Normal. Normal Exam - Right-Upper Extremity Strength Normal and Lower Extremity Strength Normal.  Lymphatic Head & Neck  General Head & Neck Lymphatics: Bilateral - Description - Normal. Axillary  General Axillary Region: Bilateral - Description - Normal. Tenderness - Non Tender. Femoral & Inguinal  Generalized Femoral & Inguinal Lymphatics: Bilateral - Description - Normal. Tenderness - Non Tender.    Assessment & Plan  PRIMARY  CANCER OF UPPER INNER QUADRANT OF LEFT FEMALE BREAST (C50.212)  Your recent imaging studies and biopsy show a small cancer in your left breast. Imaging studies show a 9 mm mass in the left breast at the 11:00 position. Biopsy shows invasive ductal carcinoma The tumor is estrogen receptor positive, HER-2 negative. Ultrasound of the axilla shows no enlarged lymph nodes  We have discussed the options of lumpectomy versus mastectomy with or without reconstruction, sentinel lymph node biopsy. You have expressed a desire for breast conservation surgery and I think you are a very good candidate for that The next step in your care is surgery  You'll be scheduled for left breast lumpectomy with radioactive seed localization and left axillary sentinel lymph node biopsy We have discussed the indications, techniques, and risks of this surgery in detail  Most likely you will be offered postop radiation therapy and anti-estrogen hormone pills. Dr. Burr Medico will decide whether or not chemotherapy is indicated  Dr. Darrel Hoover office will call you tomorrow to begin the scheduling process  you will need to have a cardiac risk assessment and clearance by your cardiologist, Dr. Percival Spanish  I strongly urge you to stop smoking.  HISTORY OF MYOCARDIAL INFARCTION (I25.2) HISTORY OF CORONARY ARTERY STENT PLACEMENT (Z95.5) TOBACCO ABUSE (Z72.0) Impression: Reportedly 2 packs per day. DIABETES MELLITUS WITH INSULIN THERAPY (E11.9) HYPERTENSION, ESSENTIAL (I10) CHRONIC GERD (K21.9)    Jasen Hartstein M. Dalbert Batman, M.D., [facility] General and Minimally invasive Surgery Breast and Colorectal Surgery Office:   [phone] Pager:   [phone]

## 2017-03-15 MED ORDER — ACETAMINOPHEN 500 MG PO TABS
1000.0000 mg | ORAL_TABLET | ORAL | Status: AC
  Administered 2017-03-16: 1000 mg via ORAL
  Filled 2017-03-15: qty 2

## 2017-03-15 MED ORDER — CELECOXIB 200 MG PO CAPS
400.0000 mg | ORAL_CAPSULE | ORAL | Status: AC
  Administered 2017-03-16: 400 mg via ORAL
  Filled 2017-03-15: qty 2

## 2017-03-15 MED ORDER — CEFAZOLIN SODIUM-DEXTROSE 2-4 GM/100ML-% IV SOLN
2.0000 g | INTRAVENOUS | Status: AC
  Administered 2017-03-16: 2 g via INTRAVENOUS
  Filled 2017-03-15: qty 100

## 2017-03-15 MED ORDER — GABAPENTIN 300 MG PO CAPS
300.0000 mg | ORAL_CAPSULE | ORAL | Status: AC
  Administered 2017-03-16: 300 mg via ORAL
  Filled 2017-03-15: qty 1

## 2017-03-16 ENCOUNTER — Encounter (HOSPITAL_COMMUNITY): Payer: Self-pay | Admitting: Certified Registered Nurse Anesthetist

## 2017-03-16 ENCOUNTER — Ambulatory Visit (HOSPITAL_COMMUNITY): Payer: Medicare Other | Admitting: Certified Registered Nurse Anesthetist

## 2017-03-16 ENCOUNTER — Encounter (HOSPITAL_COMMUNITY)
Admission: RE | Disposition: A | Discharge status: Home / Self Care | Payer: Self-pay | Source: Ambulatory Visit | Attending: General Surgery

## 2017-03-16 ENCOUNTER — Ambulatory Visit (HOSPITAL_COMMUNITY): Payer: Medicare Other | Admitting: Emergency Medicine

## 2017-03-16 ENCOUNTER — Ambulatory Visit (HOSPITAL_COMMUNITY)
Admission: RE | Admit: 2017-03-16 | Discharge: 2017-03-16 | Disposition: A | Discharge status: Home / Self Care | Payer: Medicare Other | Source: Ambulatory Visit | Attending: General Surgery | Admitting: General Surgery

## 2017-03-16 ENCOUNTER — Encounter (HOSPITAL_COMMUNITY)
Admission: RE | Admit: 2017-03-16 | Discharge: 2017-03-16 | Disposition: A | Discharge status: Home / Self Care | Payer: Medicare Other | Source: Ambulatory Visit | Attending: General Surgery | Admitting: General Surgery

## 2017-03-16 DIAGNOSIS — I110 Hypertensive heart disease with heart failure: Secondary | ICD-10-CM | POA: Diagnosis not present

## 2017-03-16 DIAGNOSIS — I509 Heart failure, unspecified: Secondary | ICD-10-CM | POA: Diagnosis not present

## 2017-03-16 DIAGNOSIS — F17210 Nicotine dependence, cigarettes, uncomplicated: Secondary | ICD-10-CM | POA: Insufficient documentation

## 2017-03-16 DIAGNOSIS — Z170 Estrogen receptor positive status [ER+]: Secondary | ICD-10-CM | POA: Diagnosis not present

## 2017-03-16 DIAGNOSIS — Z8249 Family history of ischemic heart disease and other diseases of the circulatory system: Secondary | ICD-10-CM | POA: Diagnosis not present

## 2017-03-16 DIAGNOSIS — Z794 Long term (current) use of insulin: Secondary | ICD-10-CM | POA: Diagnosis not present

## 2017-03-16 DIAGNOSIS — F419 Anxiety disorder, unspecified: Secondary | ICD-10-CM | POA: Insufficient documentation

## 2017-03-16 DIAGNOSIS — G40909 Epilepsy, unspecified, not intractable, without status epilepticus: Secondary | ICD-10-CM | POA: Insufficient documentation

## 2017-03-16 DIAGNOSIS — Z811 Family history of alcohol abuse and dependence: Secondary | ICD-10-CM | POA: Insufficient documentation

## 2017-03-16 DIAGNOSIS — C50212 Malignant neoplasm of upper-inner quadrant of left female breast: Secondary | ICD-10-CM | POA: Diagnosis present

## 2017-03-16 DIAGNOSIS — Z955 Presence of coronary angioplasty implant and graft: Secondary | ICD-10-CM | POA: Insufficient documentation

## 2017-03-16 DIAGNOSIS — D0512 Intraductal carcinoma in situ of left breast: Secondary | ICD-10-CM | POA: Diagnosis not present

## 2017-03-16 DIAGNOSIS — E785 Hyperlipidemia, unspecified: Secondary | ICD-10-CM | POA: Insufficient documentation

## 2017-03-16 DIAGNOSIS — Z818 Family history of other mental and behavioral disorders: Secondary | ICD-10-CM | POA: Diagnosis not present

## 2017-03-16 DIAGNOSIS — Z7982 Long term (current) use of aspirin: Secondary | ICD-10-CM | POA: Insufficient documentation

## 2017-03-16 DIAGNOSIS — K219 Gastro-esophageal reflux disease without esophagitis: Secondary | ICD-10-CM | POA: Diagnosis not present

## 2017-03-16 DIAGNOSIS — Z8261 Family history of arthritis: Secondary | ICD-10-CM | POA: Insufficient documentation

## 2017-03-16 DIAGNOSIS — I252 Old myocardial infarction: Secondary | ICD-10-CM | POA: Insufficient documentation

## 2017-03-16 DIAGNOSIS — F329 Major depressive disorder, single episode, unspecified: Secondary | ICD-10-CM | POA: Insufficient documentation

## 2017-03-16 DIAGNOSIS — I2510 Atherosclerotic heart disease of native coronary artery without angina pectoris: Secondary | ICD-10-CM | POA: Diagnosis not present

## 2017-03-16 DIAGNOSIS — E119 Type 2 diabetes mellitus without complications: Secondary | ICD-10-CM | POA: Diagnosis not present

## 2017-03-16 HISTORY — PX: BREAST LUMPECTOMY WITH RADIOACTIVE SEED AND SENTINEL LYMPH NODE BIOPSY: SHX6550

## 2017-03-16 LAB — GLUCOSE, CAPILLARY
GLUCOSE-CAPILLARY: 133 mg/dL — AB (ref 65–99)
GLUCOSE-CAPILLARY: 166 mg/dL — AB (ref 65–99)

## 2017-03-16 SURGERY — BREAST LUMPECTOMY WITH RADIOACTIVE SEED AND SENTINEL LYMPH NODE BIOPSY
Anesthesia: General | Site: Breast | Laterality: Left

## 2017-03-16 MED ORDER — ARTIFICIAL TEARS OPHTHALMIC OINT
TOPICAL_OINTMENT | OPHTHALMIC | Status: DC | PRN
  Administered 2017-03-16: 1 via OPHTHALMIC

## 2017-03-16 MED ORDER — ONDANSETRON HCL 4 MG/2ML IJ SOLN
INTRAMUSCULAR | Status: DC | PRN
  Administered 2017-03-16: 4 mg via INTRAVENOUS

## 2017-03-16 MED ORDER — FENTANYL CITRATE (PF) 250 MCG/5ML IJ SOLN
INTRAMUSCULAR | Status: DC | PRN
  Administered 2017-03-16 (×2): 50 ug via INTRAVENOUS

## 2017-03-16 MED ORDER — PROPOFOL 10 MG/ML IV BOLUS
INTRAVENOUS | Status: DC | PRN
  Administered 2017-03-16: 60 mg via INTRAVENOUS
  Administered 2017-03-16: 140 mg via INTRAVENOUS

## 2017-03-16 MED ORDER — EPHEDRINE SULFATE 50 MG/ML IJ SOLN
INTRAMUSCULAR | Status: DC | PRN
  Administered 2017-03-16 (×2): 5 mg via INTRAVENOUS

## 2017-03-16 MED ORDER — MIDAZOLAM HCL 2 MG/2ML IJ SOLN
1.0000 mg | Freq: Once | INTRAMUSCULAR | Status: AC
  Administered 2017-03-16: 1 mg via INTRAVENOUS

## 2017-03-16 MED ORDER — MIDAZOLAM HCL 2 MG/2ML IJ SOLN
INTRAMUSCULAR | Status: AC
  Filled 2017-03-16: qty 2

## 2017-03-16 MED ORDER — PROPOFOL 10 MG/ML IV BOLUS
INTRAVENOUS | Status: AC
  Filled 2017-03-16: qty 20

## 2017-03-16 MED ORDER — BUPIVACAINE-EPINEPHRINE 0.25% -1:200000 IJ SOLN
INTRAMUSCULAR | Status: DC | PRN
  Administered 2017-03-16: 13 mL

## 2017-03-16 MED ORDER — SUCCINYLCHOLINE CHLORIDE 20 MG/ML IJ SOLN
INTRAMUSCULAR | Status: DC | PRN
  Administered 2017-03-16: 100 mg via INTRAVENOUS

## 2017-03-16 MED ORDER — GLYCOPYRROLATE 0.2 MG/ML IJ SOLN
INTRAMUSCULAR | Status: DC | PRN
  Administered 2017-03-16: 0.2 mg via INTRAVENOUS

## 2017-03-16 MED ORDER — DEXAMETHASONE SODIUM PHOSPHATE 10 MG/ML IJ SOLN
INTRAMUSCULAR | Status: AC
  Filled 2017-03-16: qty 1

## 2017-03-16 MED ORDER — SODIUM CHLORIDE 0.9 % IJ SOLN
INTRAVENOUS | Status: DC | PRN
  Administered 2017-03-16: 5 mL

## 2017-03-16 MED ORDER — FENTANYL CITRATE (PF) 100 MCG/2ML IJ SOLN
25.0000 ug | INTRAMUSCULAR | Status: DC | PRN
  Administered 2017-03-16: 50 ug via INTRAVENOUS

## 2017-03-16 MED ORDER — OXYCODONE HCL 5 MG PO TABS
ORAL_TABLET | ORAL | Status: DC
Start: 2017-03-16 — End: 2017-03-16
  Filled 2017-03-16: qty 2

## 2017-03-16 MED ORDER — ALBUTEROL SULFATE HFA 108 (90 BASE) MCG/ACT IN AERS
INHALATION_SPRAY | RESPIRATORY_TRACT | Status: DC | PRN
  Administered 2017-03-16: 2 via RESPIRATORY_TRACT

## 2017-03-16 MED ORDER — MIDAZOLAM HCL 2 MG/2ML IJ SOLN
INTRAMUSCULAR | Status: DC | PRN
  Administered 2017-03-16 (×2): 1 mg via INTRAVENOUS

## 2017-03-16 MED ORDER — BUPIVACAINE-EPINEPHRINE (PF) 0.25% -1:200000 IJ SOLN
INTRAMUSCULAR | Status: AC
  Filled 2017-03-16: qty 30

## 2017-03-16 MED ORDER — METHYLENE BLUE 0.5 % INJ SOLN
INTRAVENOUS | Status: AC
  Filled 2017-03-16: qty 10

## 2017-03-16 MED ORDER — DEXAMETHASONE SODIUM PHOSPHATE 10 MG/ML IJ SOLN
INTRAMUSCULAR | Status: DC | PRN
  Administered 2017-03-16: 10 mg via INTRAVENOUS

## 2017-03-16 MED ORDER — ARTIFICIAL TEARS OPHTHALMIC OINT
TOPICAL_OINTMENT | OPHTHALMIC | Status: AC
  Filled 2017-03-16: qty 3.5

## 2017-03-16 MED ORDER — LIDOCAINE HCL (CARDIAC) 20 MG/ML IV SOLN
INTRAVENOUS | Status: DC | PRN
  Administered 2017-03-16: 60 mg via INTRATRACHEAL

## 2017-03-16 MED ORDER — FENTANYL CITRATE (PF) 100 MCG/2ML IJ SOLN
50.0000 ug | Freq: Once | INTRAMUSCULAR | Status: AC
  Administered 2017-03-16: 50 ug via INTRAVENOUS

## 2017-03-16 MED ORDER — PHENYLEPHRINE HCL 10 MG/ML IJ SOLN
INTRAMUSCULAR | Status: DC | PRN
  Administered 2017-03-16: 120 ug via INTRAVENOUS
  Administered 2017-03-16: 160 ug via INTRAVENOUS
  Administered 2017-03-16: 120 ug via INTRAVENOUS

## 2017-03-16 MED ORDER — ACETAMINOPHEN 325 MG PO TABS: 650.0000 mg | ORAL_TABLET | ORAL | Status: DC | PRN

## 2017-03-16 MED ORDER — LACTATED RINGERS IV SOLN
INTRAVENOUS | Status: DC
  Administered 2017-03-16: 09:00:00 via INTRAVENOUS

## 2017-03-16 MED ORDER — ACETAMINOPHEN 650 MG RE SUPP: 650.0000 mg | RECTAL | Status: DC | PRN

## 2017-03-16 MED ORDER — TECHNETIUM TC 99M SULFUR COLLOID FILTERED
1.0000 | Freq: Once | INTRAVENOUS | Status: AC | PRN
  Administered 2017-03-16: 1 via INTRADERMAL

## 2017-03-16 MED ORDER — CHLORHEXIDINE GLUCONATE CLOTH 2 % EX PADS: 6.0000 | MEDICATED_PAD | Freq: Once | CUTANEOUS | Status: DC

## 2017-03-16 MED ORDER — OXYCODONE HCL 5 MG PO TABS
5.0000 mg | ORAL_TABLET | ORAL | Status: DC | PRN
  Administered 2017-03-16: 10 mg via ORAL

## 2017-03-16 MED ORDER — LACTATED RINGERS IV SOLN: INTRAVENOUS | Status: DC

## 2017-03-16 MED ORDER — LIDOCAINE 2% (20 MG/ML) 5 ML SYRINGE
INTRAMUSCULAR | Status: AC
  Filled 2017-03-16: qty 5

## 2017-03-16 MED ORDER — FENTANYL CITRATE (PF) 250 MCG/5ML IJ SOLN
INTRAMUSCULAR | Status: AC
  Filled 2017-03-16: qty 5

## 2017-03-16 MED ORDER — ROCURONIUM BROMIDE 10 MG/ML (PF) SYRINGE
PREFILLED_SYRINGE | INTRAVENOUS | Status: AC
  Filled 2017-03-16: qty 5

## 2017-03-16 MED ORDER — SODIUM CHLORIDE 0.9 % IV SOLN: 250.0000 mL | INTRAVENOUS | Status: DC | PRN

## 2017-03-16 MED ORDER — MIDAZOLAM HCL 2 MG/2ML IJ SOLN
INTRAMUSCULAR | Status: AC
  Administered 2017-03-16: 1 mg via INTRAVENOUS
  Filled 2017-03-16: qty 2

## 2017-03-16 MED ORDER — SODIUM CHLORIDE 0.9% FLUSH: 3.0000 mL | Freq: Two times a day (BID) | INTRAVENOUS | Status: DC

## 2017-03-16 MED ORDER — SUCCINYLCHOLINE CHLORIDE 200 MG/10ML IV SOSY
PREFILLED_SYRINGE | INTRAVENOUS | Status: AC
  Filled 2017-03-16: qty 10

## 2017-03-16 MED ORDER — ONDANSETRON HCL 4 MG/2ML IJ SOLN
INTRAMUSCULAR | Status: AC
  Filled 2017-03-16: qty 2

## 2017-03-16 MED ORDER — BUPIVACAINE-EPINEPHRINE (PF) 0.5% -1:200000 IJ SOLN
INTRAMUSCULAR | Status: DC | PRN
  Administered 2017-03-16: 30 mL

## 2017-03-16 MED ORDER — SODIUM CHLORIDE 0.9 % IJ SOLN
INTRAMUSCULAR | Status: AC
  Filled 2017-03-16: qty 10

## 2017-03-16 MED ORDER — 0.9 % SODIUM CHLORIDE (POUR BTL) OPTIME
TOPICAL | Status: DC | PRN
  Administered 2017-03-16: 1000 mL

## 2017-03-16 MED ORDER — FENTANYL CITRATE (PF) 100 MCG/2ML IJ SOLN
INTRAMUSCULAR | Status: AC
  Filled 2017-03-16: qty 2

## 2017-03-16 MED ORDER — FENTANYL CITRATE (PF) 100 MCG/2ML IJ SOLN
INTRAMUSCULAR | Status: AC
  Administered 2017-03-16: 50 ug via INTRAVENOUS
  Filled 2017-03-16: qty 2

## 2017-03-16 MED ORDER — SODIUM CHLORIDE 0.9% FLUSH: 3.0000 mL | INTRAVENOUS | Status: DC | PRN

## 2017-03-16 SURGICAL SUPPLY — 54 items
ADH SKN CLS APL DERMABOND .7 (GAUZE/BANDAGES/DRESSINGS) ×1
APPLIER CLIP 9.375 MED OPEN (MISCELLANEOUS) ×3
BINDER BREAST LRG (GAUZE/BANDAGES/DRESSINGS) ×3 IMPLANT
BINDER BREAST XLRG (GAUZE/BANDAGES/DRESSINGS) IMPLANT
BLADE SURG 15 STRL LF DISP TIS (BLADE) ×2 IMPLANT
BLADE SURG 15 STRL SS (BLADE) ×6
CANISTER SUCT 3000ML PPV (MISCELLANEOUS) ×3 IMPLANT
CHLORAPREP W/TINT 26ML (MISCELLANEOUS) ×3 IMPLANT
CLIP APPLIE 9.375 MED OPEN (MISCELLANEOUS) ×1 IMPLANT
CONT SPEC 4OZ CLIKSEAL STRL BL (MISCELLANEOUS) ×3 IMPLANT
COVER PROBE W GEL 5X96 (DRAPES) ×3 IMPLANT
COVER SURGICAL LIGHT HANDLE (MISCELLANEOUS) ×3 IMPLANT
DERMABOND ADVANCED (GAUZE/BANDAGES/DRESSINGS) ×2
DERMABOND ADVANCED .7 DNX12 (GAUZE/BANDAGES/DRESSINGS) ×1 IMPLANT
DEVICE DUBIN SPECIMEN MAMMOGRA (MISCELLANEOUS) ×3 IMPLANT
DRAPE CHEST BREAST 15X10 FENES (DRAPES) ×3 IMPLANT
DRAPE HALF SHEET 40X57 (DRAPES) ×3 IMPLANT
DRAPE UTILITY XL STRL (DRAPES) ×3 IMPLANT
DRSG PAD ABDOMINAL 8X10 ST (GAUZE/BANDAGES/DRESSINGS) ×3 IMPLANT
ELECT CAUTERY BLADE 6.4 (BLADE) ×3 IMPLANT
ELECT REM PT RETURN 9FT ADLT (ELECTROSURGICAL) ×3
ELECTRODE REM PT RTRN 9FT ADLT (ELECTROSURGICAL) ×1 IMPLANT
FILTER STRAW FLUID ASPIR (MISCELLANEOUS) IMPLANT
GAUZE SPONGE 4X4 12PLY STRL (GAUZE/BANDAGES/DRESSINGS) ×3 IMPLANT
GAUZE SPONGE 4X4 12PLY STRL LF (GAUZE/BANDAGES/DRESSINGS) ×3 IMPLANT
GLOVE EUDERMIC 7 POWDERFREE (GLOVE) ×3 IMPLANT
GOWN STRL REUS W/ TWL LRG LVL3 (GOWN DISPOSABLE) ×1 IMPLANT
GOWN STRL REUS W/ TWL XL LVL3 (GOWN DISPOSABLE) ×1 IMPLANT
GOWN STRL REUS W/TWL LRG LVL3 (GOWN DISPOSABLE) ×2
GOWN STRL REUS W/TWL XL LVL3 (GOWN DISPOSABLE) ×2
ILLUMINATOR WAVEGUIDE N/F (MISCELLANEOUS) IMPLANT
KIT BASIN OR (CUSTOM PROCEDURE TRAY) ×3 IMPLANT
KIT MARKER MARGIN INK (KITS) ×3 IMPLANT
LIGHT WAVEGUIDE WIDE FLAT (MISCELLANEOUS) IMPLANT
NDL SAFETY ECLIPSE 18X1.5 (NEEDLE) IMPLANT
NEEDLE 18GX1X1/2 (RX/OR ONLY) (NEEDLE) ×6 IMPLANT
NEEDLE HYPO 18GX1.5 SHARP (NEEDLE)
NEEDLE HYPO 25X1 1.5 SAFETY (NEEDLE) ×3 IMPLANT
NS IRRIG 1000ML POUR BTL (IV SOLUTION) ×3 IMPLANT
PACK SURGICAL SETUP 50X90 (CUSTOM PROCEDURE TRAY) ×3 IMPLANT
PAD ABD 8X10 STRL (GAUZE/BANDAGES/DRESSINGS) ×3 IMPLANT
PENCIL BUTTON HOLSTER BLD 10FT (ELECTRODE) ×3 IMPLANT
SPONGE LAP 4X18 X RAY DECT (DISPOSABLE) ×3 IMPLANT
SUT MNCRL AB 4-0 PS2 18 (SUTURE) ×6 IMPLANT
SUT SILK 2 0 SH (SUTURE) ×3 IMPLANT
SUT VIC AB 3-0 SH 18 (SUTURE) ×3 IMPLANT
SUT VIC AB 3-0 SH 8-18 (SUTURE) ×3 IMPLANT
SYR BULB 3OZ (MISCELLANEOUS) ×3 IMPLANT
SYR CONTROL 10ML LL (SYRINGE) ×6 IMPLANT
TOWEL OR 17X24 6PK STRL BLUE (TOWEL DISPOSABLE) ×3 IMPLANT
TOWEL OR 17X26 10 PK STRL BLUE (TOWEL DISPOSABLE) ×3 IMPLANT
TUBE CONNECTING 12'X1/4 (SUCTIONS) ×1
TUBE CONNECTING 12X1/4 (SUCTIONS) ×2 IMPLANT
YANKAUER SUCT BULB TIP NO VENT (SUCTIONS) ×3 IMPLANT

## 2017-03-16 NOTE — Discharge Instructions (Signed)
[facility] °Office Phone Number [phone] ° °BREAST BIOPSY/ PARTIAL MASTECTOMY: POST OP INSTRUCTIONS ° °Always review your discharge instruction sheet given to you by the facility where your surgery was performed. ° °IF YOU HAVE DISABILITY OR FAMILY LEAVE FORMS, YOU MUST BRING THEM TO THE OFFICE FOR PROCESSING.  DO NOT GIVE THEM TO YOUR DOCTOR. ° °1. A prescription for pain medication may be given to you upon discharge.  Take your pain medication as prescribed, if needed.  If narcotic pain medicine is not needed, then you may take acetaminophen (Tylenol) or ibuprofen (Advil) as needed. °2. Take your usually prescribed medications unless otherwise directed °3. If you need a refill on your pain medication, please contact your pharmacy.  They will contact our office to request authorization.  Prescriptions will not be filled after 5pm or on week-ends. °4. You should eat very light the first 24 hours after surgery, such as soup, crackers, pudding, etc.  Resume your normal diet the day after surgery. °5. Most patients will experience some swelling and bruising in the breast.  Ice packs and a good support bra will help.  Swelling and bruising can take several days to resolve.  °6. It is common to experience some constipation if taking pain medication after surgery.  Increasing fluid intake and taking a stool softener will usually help or prevent this problem from occurring.  A mild laxative (Milk of Magnesia or Miralax) should be taken according to package directions if there are no bowel movements after 48 hours. °7. Unless discharge instructions indicate otherwise, you may remove your bandages 24-48 hours after surgery, and you may shower at that time.  You may have steri-strips (small skin tapes) in place directly over the incision.  These strips should be left on the skin for 7-10 days.  If your surgeon used skin glue on the incision, you may shower in 24 hours.  The glue will flake off over the  next 2-3 weeks.  Any sutures or staples will be removed at the office during your follow-up visit. °8. ACTIVITIES:  You may resume regular daily activities (gradually increasing) beginning the next day.  Wearing a good support bra or sports bra minimizes pain and swelling.  You may have sexual intercourse when it is comfortable. °a. You may drive when you no longer are taking prescription pain medication, you can comfortably wear a seatbelt, and you can safely maneuver your car and apply brakes. °b. RETURN TO WORK:  ______________________________________________________________________________________ °9. You should see your doctor in the office for a follow-up appointment approximately two weeks after your surgery.  Your doctor’s nurse will typically make your follow-up appointment when she calls you with your pathology report.  Expect your pathology report 2-3 business days after your surgery.  You may call to check if you do not hear from us after three days. °10. OTHER INSTRUCTIONS: _______________________________________________________________________________________________ _____________________________________________________________________________________________________________________________________ °_____________________________________________________________________________________________________________________________________ °_____________________________________________________________________________________________________________________________________ ° °WHEN TO CALL YOUR DOCTOR: °1. Fever over 101.0 °2. Nausea and/or vomiting. °3. Extreme swelling or bruising. °4. Continued bleeding from incision. °5. Increased pain, redness, or drainage from the incision. ° °The clinic staff is available to answer your questions during regular business hours.  Please don’t hesitate to call and ask to speak to one of the nurses for clinical concerns.  If you have a medical emergency, go to the nearest  emergency room or call 911.  A surgeon from [facility] is always on call at the hospital. ° °For further questions, please visit [email or web address]  °

## 2017-03-16 NOTE — Anesthesia Procedure Notes (Signed)
Procedure Name: Intubation Date/Time: 03/16/2017 10:38 AM Performed by: Rica Koyanagi Pre-anesthesia Checklist: Patient identified, Emergency Drugs available, Suction available and Patient being monitored Patient Re-evaluated:Patient Re-evaluated prior to inductionOxygen Delivery Method: Circle system utilized Preoxygenation: Pre-oxygenation with 100% oxygen Intubation Type: IV induction Ventilation: Mask ventilation without difficulty Laryngoscope Size: Mac and 3 Grade View: Grade I Tube type: Oral Tube size: 7.0 mm Number of attempts: 1 Airway Equipment and Method: Stylet Secured at: 22 cm Dental Injury: Teeth and Oropharynx as per pre-operative assessment

## 2017-03-16 NOTE — Anesthesia Procedure Notes (Addendum)
Anesthesia Regional Block: Pectoralis block   Pre-Anesthetic Checklist: ,, timeout performed, Correct Patient, Correct Site, Correct Laterality, Correct Procedure, Correct Position, site marked, Risks and benefits discussed, Surgical consent,  Pre-op evaluation,  At surgeon's request  Laterality: Left and Lower  Prep: chloraprep       Needles:   Needle Type: Echogenic Stimulator Needle     Needle Length: 9cm  Needle Gauge: 21   Needle insertion depth: 4 cm   Additional Needles:   Procedures: ultrasound guided,,,,,,,,  Narrative:  Start time: 03/16/2017 9:45 AM End time: 03/16/2017 10:00 AM Injection made incrementally with aspirations every 5 mL.  Performed by: Personally  Anesthesiologist: Zarius Furr

## 2017-03-16 NOTE — Interval H&P Note (Signed)
History and Physical Interval Note:  03/16/2017 8:06 AM  Monica [name]  has presented today for surgery, with the diagnosis of LEFT BREAST CANCER, UPPER INNER QUADRANT  The various methods of treatment have been discussed with the patient and family. After consideration of risks, benefits and other options for treatment, the patient has consented to  Procedure(s): INJECT BLUE DYE LEFT BREAST, LEFT BREAST LUMPECTOMY WITH RADIOACTIVE SEED AND LEFT AXILLARY DEEP SENTINEL LYMPH NODE BIOPSY (Left) as a surgical intervention .  The patient's history has been reviewed, patient examined, no change in status, stable for surgery.  I have reviewed the patient's chart and labs.  Questions were answered to the patient's satisfaction.     Adin Hector

## 2017-03-16 NOTE — Op Note (Addendum)
Patient Name:           Monica [name]   Date of Surgery:        03/16/2017  Pre op Diagnosis:    Invasive ductal carcinoma left breast, upper inner quadrant, estrogen receptor positive    Post op Diagnosis:    Same  Procedure:                 Inject blue dye left breast                                      Left partial mastectomy with radioactive seed localization                                       Adjacent tissue transfer left lumpectomy incision, 8 cm transversely by 4 cm vertically by 2 cm deep                                        Left axillary deep sentinel lymph node biopsy  Surgeon:                     Edsel Petrin. Dalbert Batman, M.D., FACS  Assistant:                      OR staff   Indication for Assistant: N/A  Operative Indications:    This is a 71 year old female, referred by Dr. Christene Slates at [facility] mammography for evaluation and management of a newly diagnosed cancer of the left breast at the 11:00 position. She was seen in the [facility] recently by Dr. Burr Medico, Dr. Lisbeth Renshaw, and me. Dr. Jani Gravel is her PCP. Dr. Percival Spanish is her cardiologist.      She gets annual screening mammograms. Recent imaging studies show a 9 mm mass in the left breast, upper inner quadrant, 11:00 position. Axilla is negative by ultrasound. Low breast density. MRI does not seem indicated. 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 Ki-67 15%.      Past history reveals a left breast biopsy 35 years ago by Lennie Hummer for benign disease. Coronary artery disease with myocardial infarction in 2003 and she has a stent in place. Takes aspirin but no other anticoagulants. Hypertension. Hyperlipidemia. Insulin-dependent diabetes mellitus. GERD. Had a tubal ligation with postop hemorrhage and had a laparotomy for hemorrhage. Appendectomy. Umbilical hernia repair with mesh.      Family history negative for breast or ovarian cancer. Mother died of dementia and kidney  disease. Father died of myocardial infarction. 2 brothers with myocardial infarction.      Social history reveals she is married.  Smokes 2 packs of cigarettes per day. Denies alcohol.\      We had a long talk. We discussed mastectomy with or without reconstruction, lumpectomy with radioactive seed, sentinel node biopsy. She is strongly motivated for breast conservation surgery and I think that would be best for her. Assuming all goes well with her lumpectomy she will receive adjuvant whole breast radiation therapy. Dr. Burr Medico plans Oncotype testing.      She will be scheduled for left breast lumpectomy with radioactive seed localization. Left axillary deep  sentinel node biopsy. I discussed the indications, details, techniques, and numerous risk of the surgery with her and her family. She is aware of the risk of bleeding, infection, reoperation for positive margins or positive nodes, cosmetic deformity, nerve damage with chronic pain. She understands all these issues. All of her questions are answered. She agrees with this plan.        Operative Findings:       Her breasts are small.  The lumpectomy was taken down and included the pectoralis fascia on the broad posterior margin.  This left a defect so we had to undermine the tissues superiorly and inferiorly to slide and transfer them together to rebuild the breast mound.  The specimen mammogram looked good containing the radioactive seed in the marker biopsy clip.  Neither of these appear to be at a margin.  The left axillary lymph nodes were not pathologically enlarged.  I found for sentinel lymph nodes.  Procedure in Detail:          The patient underwent left pectoral block by the anesthesiologist, and also  underwent injection of technetium 99 radionuclide into the left breast by the nuclear medicine technician.     In the operating room she underwent general LMA anesthesia.  Surgical timeout was performed and intravenous antibiotics were  given.  Following alcohol prep I injected 5 mL of dilute methylene blue into the left breast, subareolar area, massage the breast for a few minutes.  The left chest wall and axilla were then extensively prepped and draped.  0.5% Marcaine with epinephrine was used as a local infiltration anesthetic.      Using the neoprobe as a guide I made a transverse curvilinear incision in the upper left breast.  Lumpectomy was performed using the neoprobe and electrocautery, all the way down to the pectoralis muscle.  The specimen was marked with silk sutures and a 6 color ink  Kit  to orient the pathologist.  The specimen mammogram looked good as described above.  The specimen was marked and sent to the lab.  This wound was irrigated.  Hemostasis was excellent.  I undermined the breast tissues off of the pectoralis muscle superiorly and inferiorly and then slid them together with multiple interrupted sutures of 3-0 Vicryl incorporating the muscle into the closure.  The skin was closed with a running subcuticular 4-0 Monocryl and Dermabond.  The lumpectomy cavity was marked with 5 metal marker clips prior to closure.       I then made a transverse incision in the left axilla.  Dissection was carried down through the clavipectoral fascia into the axillary space.  Using the neoprobe on the technetium setting I found four sentinel lymph nodes.  These were sent for separate evaluation.  Hemostasis was excellent and achieved with electrocautery and metal clips.  After irrigating the wound to close the clavipectoral fascia with 3-0 Vicryl sutures and the skin was closed with running subcuticular 4-0 Monocryl and Dermabond.  Dry bandages and a breast binder were placed and the patient was taken to PACU in stable condition.  EBL 20 mL.  Counts correct.  Complications none.      Edsel Petrin. Dalbert Batman, M.D., FACS General and Minimally Invasive Surgery Breast and Colorectal Surgery   Addendum: I logged onto the [facility] website and  reviewed her prescription medication history   03/16/2017 11:43 AM

## 2017-03-16 NOTE — Anesthesia Preprocedure Evaluation (Addendum)
Anesthesia Evaluation  Patient identified by MRN, date of birth, ID band Patient awake    Reviewed: Allergy & Precautions, NPO status , Patient's Chart, lab work & pertinent test results  Airway Mallampati: I   Neck ROM: Full    Dental   Pulmonary former smoker,    breath sounds clear to auscultation       Cardiovascular hypertension, + CAD, + Past MI and +CHF   Rhythm:Regular Rate:Normal     Neuro/Psych    GI/Hepatic negative GI ROS, Neg liver ROS,   Endo/Other  diabetes  Renal/GU negative Renal ROS     Musculoskeletal   Abdominal   Peds  Hematology   Anesthesia Other Findings   Reproductive/Obstetrics                            Anesthesia Physical Anesthesia Plan  ASA: III  Anesthesia Plan: General   Post-op Pain Management:  Regional for Post-op pain   Induction: Intravenous  PONV Risk Score and Plan: 3 and Ondansetron, Dexamethasone, Propofol, Midazolam and Treatment may vary due to age  Airway Management Planned:   Additional Equipment:   Intra-op Plan:   Post-operative Plan: Extubation in OR  Informed Consent: I have reviewed the patients History and Physical, chart, labs and discussed the procedure including the risks, benefits and alternatives for the proposed anesthesia with the patient or authorized representative who has indicated his/her understanding and acceptance.   Dental advisory given  Plan Discussed with: CRNA  Anesthesia Plan Comments:         Anesthesia Quick Evaluation

## 2017-03-16 NOTE — Transfer of Care (Signed)
Immediate Anesthesia Transfer of Care Note  Patient: Monica [name]  Procedure(s) Performed: Procedure(s): INJECT BLUE DYE LEFT BREAST, LEFT BREAST LUMPECTOMY WITH RADIOACTIVE SEED AND LEFT AXILLARY DEEP SENTINEL LYMPH NODE  BIOPSY ADJACENT TISSUE TRANSFER (Left)  Patient Location: PACU  Anesthesia Type:General  Level of Consciousness: awake, alert  and patient cooperative  Airway & Oxygen Therapy: Patient Spontanous Breathing and Patient connected to face mask  Post-op Assessment: Report given to RN, Post -op Vital signs reviewed and stable, Patient moving all extremities X 4 and Patient able to stick tongue midline  Post vital signs: Reviewed and stable  Last Vitals:  Vitals:   03/16/17 1005 03/16/17 1150  BP: 135/66 123/72  Pulse:  66  Resp: 13 16  Temp:  36.5 C    Last Pain:  Vitals:   03/16/17 0939  TempSrc: Oral         Complications: No apparent anesthesia complications

## 2017-03-17 ENCOUNTER — Encounter (HOSPITAL_COMMUNITY): Payer: Self-pay | Admitting: General Surgery

## 2017-03-17 NOTE — Anesthesia Postprocedure Evaluation (Signed)
Anesthesia Post Note  Patient: SHERVON KERWIN  Procedure(s) Performed: Procedure(s) (LRB): INJECT BLUE DYE LEFT BREAST, LEFT BREAST LUMPECTOMY WITH RADIOACTIVE SEED AND LEFT AXILLARY DEEP SENTINEL LYMPH NODE  BIOPSY ADJACENT TISSUE TRANSFER (Left)     Patient location during evaluation: PACU Anesthesia Type: General and Regional Level of consciousness: awake and alert Pain management: pain level controlled Vital Signs Assessment: post-procedure vital signs reviewed and stable Respiratory status: spontaneous breathing, nonlabored ventilation, respiratory function stable and patient connected to nasal cannula oxygen Cardiovascular status: blood pressure returned to baseline and stable Postop Assessment: no signs of nausea or vomiting Anesthetic complications: no    Last Vitals:  Vitals:   03/16/17 1314 03/16/17 1315  BP:  90/66  Pulse: 67 (!) 52  Resp: 14 16  Temp:      Last Pain:  Vitals:   03/16/17 1315  TempSrc:   PainSc: 0-No pain                 Rachid Parham,JAMES TERRILL

## 2017-03-18 ENCOUNTER — Telehealth: Payer: Self-pay | Admitting: *Deleted

## 2017-03-18 NOTE — Telephone Encounter (Signed)
Received order for oncotype testing. Requisition sent to pathology. Received by Varney Biles.

## 2017-03-18 NOTE — Progress Notes (Signed)
Inform patient of Pathology report,.  Tell Monica [name] that Monica [name] breast cancer was completely removed.  The margins are negative.  All 4 lymph nodes are negative for cancer.  This is excellent news.  I will discuss this with Monica [name] in detail and Monica [name] next office visit. Please let me know today that you contacted Monica [name] and discussed this with Monica [name].  hmi

## 2017-03-20 NOTE — Progress Notes (Signed)
Cardiology Office Note   Date:  03/21/2017   ID:  Monica [name], DOB Jul 22, 1946, MRN [id number]  PCP:  Jani Gravel, MD  Cardiologist:   Minus Breeding, MD   Chief Complaint  Patient presents with  . Coronary Artery Disease      History of Present Illness: Monica [name] is a 71 y.o. female who presents for evaluation of coronary artery disease. She had previous stenting to an LAD. This was in 2003 with DES.  In 2016  POET (Plain Old Exercise Treadmill) was negative.  She returns for follow up.  She has had treatment for breast cancer and she reports that she is cancer free.  The patient denies any new symptoms such as chest discomfort, neck or arm discomfort. There has been no new shortness of breath, PND or orthopnea. There have been no reported palpitations, presyncope or syncope.    Past Medical History:  Diagnosis Date  . CAD (coronary artery disease)    Cypher stent 09/2002  . CHF (congestive heart failure) ([place])   . Diabetes mellitus ([place])   . Hyperlipidemia   . Hypertension   . Myocardial infarction ([place])    2003,went into cardiac shock  . Osteoporosis   . Sinusitis     Past Surgical History:  Procedure Laterality Date  . ABDOMINAL HYSTERECTOMY    . APPENDECTOMY    . BREAST LUMPECTOMY WITH RADIOACTIVE SEED AND SENTINEL LYMPH NODE BIOPSY Left 03/16/2017   Procedure: INJECT BLUE DYE LEFT BREAST, LEFT BREAST LUMPECTOMY WITH RADIOACTIVE SEED AND LEFT AXILLARY DEEP SENTINEL LYMPH NODE  BIOPSY ADJACENT TISSUE TRANSFER;  Surgeon: [name], MD;  Location: [facility];  Service: General;  Laterality: Left;  . CARPAL TUNNEL RELEASE Bilateral   . COLONOSCOPY WITH PROPOFOL N/A 05/30/2015   Procedure: COLONOSCOPY WITH PROPOFOL;  Surgeon: Carol Ada, MD;  Location: [facility];  Service: Endoscopy;  Laterality: N/A;  . CORONARY ANGIOPLASTY WITH STENT PLACEMENT  10-02-2002  . Exploratory abdominal     Bleeding in abdomen after tubal  . TUBAL LIGATION    . UMBILICAL HERNIA  REPAIR       Current Outpatient Prescriptions  Medication Sig Dispense Refill  . acetaminophen (TYLENOL) 500 MG tablet Take 1,000 mg by mouth every 6 (six) hours as needed for moderate pain or headache.    Marland Kitchen aspirin EC 81 MG tablet Take 81 mg by mouth at bedtime.    Marland Kitchen atorvastatin (LIPITOR) 40 MG tablet Take 40 mg by mouth at bedtime.    . Calcium Carbonate-Vitamin D 600-400 MG-UNIT per tablet Take 1 tablet by mouth 2 (two) times daily.    . carvedilol (COREG) 3.125 MG tablet Take 3.125 mg by mouth 2 (two) times daily.     . clonazePAM (KLONOPIN) 1 MG tablet Take 1 mg by mouth at bedtime.     Marland Kitchen glimepiride (AMARYL) 4 MG tablet Take 4 mg by mouth 2 (two) times daily.    . halobetasol (ULTRAVATE) 0.05 % cream Apply 1 application topically 2 (two) times daily. Applied to rash on leg (ankle area)    . Insulin Detemir (LEVEMIR) 100 UNIT/ML Pen Inject 17-25 Units into the skin 2 (two) times daily. 25 units in the morning & 17 units at night    . JANUVIA 100 MG tablet Take 100 mg by mouth at bedtime.     Marland Kitchen losartan (COZAAR) 50 MG tablet Take 50 mg by mouth at bedtime.     . metFORMIN (GLUCOPHAGE-XR) 500 MG 24  hr tablet Take 500 mg by mouth 2 (two) times daily.    . Multiple Vitamin (MULTIVITAMIN WITH MINERALS) TABS tablet Take 1 tablet by mouth at bedtime.    . Omega-3 Fatty Acids (FISH OIL) 1200 MG CAPS Take 2,400 mg by mouth 2 (two) times daily.    Marland Kitchen omeprazole (PRILOSEC) 20 MG capsule Take 20 mg by mouth at bedtime.     Glory Rosebush DELICA LANCETS 59D MISC 1 each by Other route 3 (three) times daily.     Glory Rosebush VERIO test strip 1 each by Other route 3 (three) times daily.     . pioglitazone (ACTOS) 30 MG tablet Take 30 mg by mouth at bedtime.    Vladimir Faster Glycol-Propyl Glycol (SYSTANE) 0.4-0.3 % SOLN Place 1 drop into both eyes 2 (two) times daily.    . risedronate (ACTONEL) 150 MG tablet Take 150 mg by mouth every 30 (thirty) days.    Marland Kitchen sertraline (ZOLOFT) 100 MG tablet Take 100 mg by  mouth at bedtime.    . nitroGLYCERIN (NITROSTAT) 0.4 MG SL tablet Place 1 tablet (0.4 mg total) under the tongue every 5 (five) minutes as needed for chest pain. 25 tablet 1   No current facility-administered medications for this visit.     Allergies:   Ace inhibitors; Keflex [cephalexin]; Phenobarbital; Sulfate; and Altace [ramipril]    ROS:  Please see the history of present illness.   Otherwise, review of systems are positive for none.   All other systems are reviewed and negative.    PHYSICAL EXAM: VS:  BP 138/74   Pulse (!) 59   Ht 5\' 5"  (1.651 m)   Wt 123 lb 12.8 oz (56.2 kg)   SpO2 95%   BMI 20.60 kg/m  , BMI Body mass index is 20.6 kg/m.  GENERAL:  Well appearing NECK:  No jugular venous distention, waveform within normal limits, carotid upstroke brisk and symmetric, no bruits, no thyromegaly LUNGS:  Clear to auscultation bilaterally CHEST:  Unremarkable HEART:  PMI not displaced or sustained,S1 and S2 within normal limits, no S3, no S4, no clicks, no rubs, no murmurs ABD:  Flat, positive bowel sounds normal in frequency in pitch, no bruits, no rebound, no guarding, no midline pulsatile mass, no hepatomegaly, no splenomegaly EXT:  2 plus pulses throughout, no edema, no cyanosis no clubbing   EKG:  EKG is ordered today. NSR, incomplete RBBB, no acteu ST T wave changes.    Recent Labs:     Wt Readings from Last 3 Encounters:  03/21/17 123 lb 12.8 oz (56.2 kg)  03/16/17 125 lb (56.7 kg)  03/09/17 125 lb 3.5 oz (56.8 kg)      Other studies Reviewed: Additional studies/ records that were reviewed today include: None Review of the above records demonstrates:     ASSESSMENT AND PLAN:  CAD:  The patient has no new sypmtoms.  No further cardiovascular testing is indicated.  We will continue with aggressive risk reduction and meds as listed.  DYSLIPIDEMIA:  She has this followed by Jani Gravel, MD.    The goal should be less than 70 LDL.    TOBACCO:  I gave her the  number for 1 [id number].  She has not tolerated Chantix.    DM:  Insulin was increased recently.  I talked to her about this.  She will follow with Jani Gravel, MD   PROLONGED QT:  This was present on the previous EKG but has normalized on today's  Current medicines are reviewed at length with the patient today.  The patient does not have concerns regarding medicines.  The following changes have been made:  none  Labs/ tests ordered today include:  None  No orders of the defined types were placed in this encounter.    Disposition:   FU with me in one year.     Signed, Minus Breeding, MD  03/21/2017 12:20 PM    [facility]

## 2017-03-21 ENCOUNTER — Encounter: Payer: Self-pay | Admitting: Cardiology

## 2017-03-21 ENCOUNTER — Ambulatory Visit (INDEPENDENT_AMBULATORY_CARE_PROVIDER_SITE_OTHER): Payer: Medicare Other | Admitting: Cardiology

## 2017-03-21 VITALS — BP 138/74 | HR 59 | Ht 65.0 in | Wt 123.8 lb

## 2017-03-21 DIAGNOSIS — I2510 Atherosclerotic heart disease of native coronary artery without angina pectoris: Secondary | ICD-10-CM | POA: Diagnosis not present

## 2017-03-21 DIAGNOSIS — Z794 Long term (current) use of insulin: Secondary | ICD-10-CM | POA: Diagnosis not present

## 2017-03-21 DIAGNOSIS — E118 Type 2 diabetes mellitus with unspecified complications: Secondary | ICD-10-CM

## 2017-03-21 DIAGNOSIS — E785 Hyperlipidemia, unspecified: Secondary | ICD-10-CM | POA: Diagnosis not present

## 2017-03-21 DIAGNOSIS — Z720 Tobacco use: Secondary | ICD-10-CM | POA: Insufficient documentation

## 2017-03-21 MED ORDER — NITROGLYCERIN 0.4 MG SL SUBL: 0.4000 mg | SUBLINGUAL_TABLET | SUBLINGUAL | 1 refills | Status: DC | PRN

## 2017-03-21 NOTE — Patient Instructions (Signed)
Medication Instructions:  Continue current medications  Labwork: None Ordered  Testing/Procedures: None Ordered  Follow-Up: Your physician wants you to follow-up in: 1 Year. You will receive a reminder letter in the mail two months in advance. If you don't receive a letter, please call our office to schedule the follow-up appointment.   Any Other Special Instructions Will Be Listed Below (If Applicable).  [phone]- Quit- Now   If you need a refill on your cardiac medications before your next appointment, please call your pharmacy.

## 2017-03-23 NOTE — Addendum Note (Signed)
Addended by: Milderd Meager on: 03/23/2017 01:46 PM   Modules accepted: Orders

## 2017-03-25 ENCOUNTER — Encounter (HOSPITAL_COMMUNITY): Payer: Self-pay

## 2017-03-28 ENCOUNTER — Telehealth: Payer: Self-pay | Admitting: *Deleted

## 2017-03-28 NOTE — Telephone Encounter (Signed)
Spoke with patient to schedule appointment with Dr. Burr Medico to discuss oncotype results.  Appointment confirmed for 04/20/17 at 145pm.

## 2017-03-28 NOTE — Telephone Encounter (Signed)
Received Oncotype Dx score of 27/18%.  Gave a copy to Dr. Burr Medico, Varney Biles and took a copy to HIM to scan.

## 2017-04-19 NOTE — Progress Notes (Addendum)
[place]  [phone] Fax:([phone]) [phone]  Clinic Follow-up Note   Patient Care Team: Jani Gravel, MD as PCP - General (Internal Medicine) [name], MD as Consulting Physician (General Surgery) Truitt Merle, MD as Consulting Physician (Hematology) Kyung Rudd, MD as Consulting Physician (Radiation Oncology) 04/20/2017  CHIEF COMPLAINTS:  Follow up left Breast cancer   Oncology History   Cancer Staging Malignant neoplasm of upper-inner quadrant of left breast in female, estrogen receptor positive ([place]) Staging form: Breast, AJCC 8th Edition - Clinical stage from 02/16/2017: Stage IA (cT1b, cN0, cM0, G2, ER: Positive, PR: Positive, HER2: Negative) - Unsigned Staging comments: Staged at breast conference  - Pathologic stage from 03/16/2017: Stage IA (pT1b, pN0, cM0, G2, ER: Positive, PR: Positive, HER2: Negative, Oncotype DX score: 27) - Signed by Truitt Merle, MD on 04/24/2017       Malignant neoplasm of upper-inner quadrant of left breast in female, estrogen receptor positive ([place])   02/09/2017 Initial Diagnosis    Malignant neoplasm of upper-inner quadrant of left breast in female, estrogen receptor positive ([place])      02/09/2017 Initial Biopsy    Diagnosis Breast, left, needle core biopsy - INVASIVE DUCTAL CARCINOMA, 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 Dalbert Batman       03/16/2017 Pathology Results    PATHOLOGY REPORT Diagnosis 03/16/17 1. Breast, lumpectomy, Left - INVASIVE DUCTAL CARCINOMA, NOTTINGHAM GRADE 2 OF 3, 0.9 CM - DUCTAL CARCINOMA IN SITU - MARGINS UNINVOLVED BY CARCINOMA (0.3 CM POSTERIOR  MARGIN) - PREVIOUS BIOPSY SITE CHANGES - SEE ONCOLOGY TABLE BELOW 2. Lymph node, sentinel, biopsy, Left axillary #1 - NO CARCINOMA IDENTIFIED IN ONE LYMPH NODE (0/1) 3. Lymph node, sentinel, biopsy, Left axillary #2 - NO CARCINOMA IDENTIFIED IN ONE LYMPH NODE (0/1) 4. Lymph node, sentinel, biopsy, Left axillary #3 - NO CARCINOMA IDENTIFIED IN ONE LYMPH NODE (0/1) 5. Lymph node, sentinel, biopsy, Left axillary #4 - NO CARCINOMA IDENTIFIED IN ONE LYMPH NODE (0/1)       03/16/2017 Oncotype testing    Recurrance score of 27 with a 18% distance recurrance in the net 10 years with Tamoxifen alone         HISTORY OF PRESENTING ILLNESS: 02/16/17 Monica [name] 71 y.o. female is here because of newly diagnosed left breast cancer. She is accompanied by her multiple family members to our disciplinary breast clinic today.  This was found by a mammogram screening of the left breast. She had a abnormal benign mammogram 35 years ago and had surgery to remove the mass form the left breast. She denies feeling a lump herself and denies any change lately. She reports having a fair appetite and no weight loss. She ranges 124/127 pounds. She denies and pain and aches but has pain in her left knee joint but tolerable as well in her hip. She takes tylenol for pain. She has a history of DM and follows up with Dr. Maudie Mercury. Her sugar has been running high lightly. Had a CHF and MI in 2003 and has a stent.She denies chest pain and her last echo was in 2016 and is normal. She has not had a stroke.  No history of breast cancer in family. She still smokes.   Her most exercise is walking her dog and she remain moving around the house. She will see her PCP next week   GYN HISTORY  Menarchal: 11 LMP: many years ago, not sure  Contraceptive: none  HRT: none  G3P3: She was 25 at first live births.   CURRENT THERAPY: PENDING adjuvant radiation and anti-estrogen therapy    INTERVAL HISTORY:  Monica [name] is here for a  follow up. She presents to the clinic today with husband and daughter. Her surgery was last month and she saud it went well. She took pain medication and is off of it. She only takes tylenol. She has pain in her left axilla region and tenderness around her left breast. She had a fluid collection but now that is resolved.    MEDICAL HISTORY:  Past Medical History:  Diagnosis Date  . CAD (coronary artery disease)    Cypher stent 09/2002  . CHF (congestive heart failure) ([place])   . Diabetes mellitus ([place])   . Hyperlipidemia   . Hypertension   . Myocardial infarction ([place])    2003,went into cardiac shock  . Osteoporosis   . Sinusitis     SURGICAL HISTORY: Past Surgical History:  Procedure Laterality Date  . ABDOMINAL HYSTERECTOMY    . APPENDECTOMY    . BREAST LUMPECTOMY WITH RADIOACTIVE SEED AND SENTINEL LYMPH NODE BIOPSY Left 03/16/2017   Procedure: INJECT BLUE DYE LEFT BREAST, LEFT BREAST LUMPECTOMY WITH RADIOACTIVE SEED AND LEFT AXILLARY DEEP SENTINEL LYMPH NODE  BIOPSY ADJACENT TISSUE TRANSFER;  Surgeon: [name], MD;  Location: [facility];  Service: General;  Laterality: Left;  . CARPAL TUNNEL RELEASE Bilateral   . COLONOSCOPY WITH PROPOFOL N/A 05/30/2015   Procedure: COLONOSCOPY WITH PROPOFOL;  Surgeon: Carol Ada, MD;  Location: [facility];  Service: Endoscopy;  Laterality: N/A;  . CORONARY ANGIOPLASTY WITH STENT PLACEMENT  10-02-2002  . Exploratory abdominal     Bleeding in abdomen after tubal  . TUBAL LIGATION    . UMBILICAL HERNIA REPAIR      SOCIAL HISTORY: Social History   Social History  . Marital status: Married    Spouse name: N/A  . Number of children: 3  . Years of education: N/A   Occupational History  . Not on file.   Social History Main Topics  . Smoking status: Former Smoker    Packs/day: 2.00    Years: 30.00    Types: Cigarettes    Quit date: 03/09/2017  . Smokeless tobacco: Current User     Comment: e cig  . Alcohol use No  . Drug use: No   . Sexual activity: Not on file   Other Topics Concern  . Not on file   Social History Narrative   Lives with husband.     FAMILY HISTORY: Family History  Problem Relation Age of Onset  . Dementia Mother   . Kidney disease Mother   . Heart attack Father 64       Died age 73  . Heart attack Brother 49  . Heart attack Brother 27    ALLERGIES:  is allergic to ace inhibitors; keflex [cephalexin]; phenobarbital; sulfate; and altace [ramipril].  MEDICATIONS:  Current Outpatient Prescriptions  Medication Sig Dispense Refill  . acetaminophen (TYLENOL) 500 MG tablet Take 1,000 mg by mouth every 6 (six) hours as needed for moderate pain or headache.    Marland Kitchen aspirin EC 81 MG tablet Take 81  mg by mouth at bedtime.    Marland Kitchen atorvastatin (LIPITOR) 40 MG tablet Take 40 mg by mouth at bedtime.    . Calcium Carbonate-Vitamin D 600-400 MG-UNIT per tablet Take 1 tablet by mouth 2 (two) times daily.    . carvedilol (COREG) 3.125 MG tablet Take 3.125 mg by mouth 2 (two) times daily.     . clonazePAM (KLONOPIN) 1 MG tablet Take 1 mg by mouth at bedtime.     Marland Kitchen glimepiride (AMARYL) 4 MG tablet Take 4 mg by mouth 2 (two) times daily.    . halobetasol (ULTRAVATE) 0.05 % cream Apply 1 application topically 2 (two) times daily. Applied to rash on leg (ankle area)    . Insulin Detemir (LEVEMIR) 100 UNIT/ML Pen Inject 17-25 Units into the skin 2 (two) times daily. 25 units in the morning & 17 units at night    . JANUVIA 100 MG tablet Take 100 mg by mouth at bedtime.     Marland Kitchen losartan (COZAAR) 50 MG tablet Take 50 mg by mouth at bedtime.     . metFORMIN (GLUCOPHAGE-XR) 500 MG 24 hr tablet Take 500 mg by mouth 2 (two) times daily.    . Multiple Vitamin (MULTIVITAMIN WITH MINERALS) TABS tablet Take 1 tablet by mouth at bedtime.    . nitroGLYCERIN (NITROSTAT) 0.4 MG SL tablet Place 1 tablet (0.4 mg total) under the tongue every 5 (five) minutes as needed for chest pain. 25 tablet 1  . Omega-3 Fatty Acids (FISH OIL) 1200  MG CAPS Take 2,400 mg by mouth 2 (two) times daily.    Marland Kitchen omeprazole (PRILOSEC) 20 MG capsule Take 20 mg by mouth at bedtime.     Glory Rosebush DELICA LANCETS 35T MISC 1 each by Other route 3 (three) times daily.     Glory Rosebush VERIO test strip 1 each by Other route 3 (three) times daily.     . pioglitazone (ACTOS) 30 MG tablet Take 30 mg by mouth at bedtime.    Vladimir Faster Glycol-Propyl Glycol (SYSTANE) 0.4-0.3 % SOLN Place 1 drop into both eyes 2 (two) times daily.    . risedronate (ACTONEL) 150 MG tablet Take 150 mg by mouth every 30 (thirty) days.    Marland Kitchen sertraline (ZOLOFT) 100 MG tablet Take 100 mg by mouth at bedtime.     No current facility-administered medications for this visit.     REVIEW OF SYSTEMS:   Constitutional: Denies fevers, chills or abnormal night sweats (+) fair appetite Eyes: Denies blurriness of vision, double vision or watery eyes Ears, nose, mouth, throat, and face: Denies mucositis or sore throat Respiratory: Denies cough, dyspnea or wheezes Cardiovascular: Denies palpitation, chest discomfort or lower extremity swelling Gastrointestinal:  Denies nausea, heartburn or change in bowel habits MSK: (+) occasional knee joint pain Skin: Denies abnormal skin rashes Lymphatics: Denies new lymphadenopathy or easy bruising Neurological:Denies numbness, tingling or new weaknesses Behavioral/Psych: Mood is stable, no new changes  All other systems were reviewed with the patient and are negative. Breast: (+) pain in left axilla and tenderness around left breast due to surgery   PHYSICAL EXAMINATION: ECOG PERFORMANCE STATUS: 0 - Asymptomatic  Vitals:   04/20/17 1428  BP: 128/60  Pulse: (!) 58  Resp: 18  Temp: 98.2 F (36.8 C)   Filed Weights   04/20/17 1428  Weight: 123 lb 12.8 oz (56.2 kg)    GENERAL:alert, no distress and comfortable SKIN: skin color, texture, turgor are normal, no rashes or significant lesions EYES: normal,  conjunctiva are pink and non-injected,  sclera clear OROPHARYNX:no exudate, no erythema and lips, buccal mucosa, and tongue normal  NECK: supple, thyroid normal size, non-tender, without nodularity LYMPH:  no palpable lymphadenopathy in the cervical, axillary or inguinal LUNGS: clear to auscultation and percussion with normal breathing effort HEART: regular rate & rhythm and no murmurs and no lower extremity edema ABDOMEN: abdomen soft, non-tender and normal bowel sounds Musculoskeletal:no cyanosis of digits and no clubbing  PSYCH: alert & oriented x 3 with fluent speech NEURO: no focal motor/sensory deficits Breasts: Breast inspection showed them to be symmetrical with no nipple discharge. Palpation of the breasts and axilla revealed no obvious mass that I could appreciate. (+)  bruise in middle upper left breast and a pea sizes lump likely related to biopsy, no other palpable mass. (+) left UOQ and left axilla incisions, healing well. No palpable mass.  LABORATORY DATA:  I have reviewed the data as listed CBC Latest Ref Rng & Units 03/09/2017 07/09/2016  WBC 4.0 - 10.5 K/uL 6.7 6.3  Hemoglobin 12.0 - 15.0 g/dL 14.8 14.5  Hematocrit 36.0 - 46.0 % 45.3 42.9  Platelets 150 - 400 K/uL 121(L) 108(L)    CMP Latest Ref Rng & Units 03/09/2017 07/09/2016  Glucose 65 - 99 mg/dL 113(H) 236(H)  BUN 6 - 20 mg/dL <5(L) 12  Creatinine 0.44 - 1.00 mg/dL 0.50 0.60  Sodium 135 - 145 mmol/L 135 136  Potassium 3.5 - 5.1 mmol/L 4.0 3.7  Chloride 101 - 111 mmol/L 100(L) 103  CO2 22 - 32 mmol/L 25 21(L)  Calcium 8.9 - 10.3 mg/dL 10.0 9.7  Total Protein 6.5 - 8.1 g/dL - 6.8  Total Bilirubin 0.3 - 1.2 mg/dL - 0.6  Alkaline Phos 38 - 126 U/L - 50  AST 15 - 41 U/L - 40  ALT 14 - 54 U/L - 39    PATHOLOGY REPORT Diagnosis 03/16/17 1. Breast, lumpectomy, Left - INVASIVE DUCTAL CARCINOMA, NOTTINGHAM GRADE 2 OF 3, 0.9 CM - DUCTAL CARCINOMA IN SITU - MARGINS UNINVOLVED BY CARCINOMA (0.3 CM POSTERIOR MARGIN) - PREVIOUS BIOPSY SITE CHANGES - SEE  ONCOLOGY TABLE BELOW 2. Lymph node, sentinel, biopsy, Left axillary #1 - NO CARCINOMA IDENTIFIED IN ONE LYMPH NODE (0/1) 3. Lymph node, sentinel, biopsy, Left axillary #2 - NO CARCINOMA IDENTIFIED IN ONE LYMPH NODE (0/1) 4. Lymph node, sentinel, biopsy, Left axillary #3 - NO CARCINOMA IDENTIFIED IN ONE LYMPH NODE (0/1) 5. Lymph node, sentinel, biopsy, Left axillary #4 - NO CARCINOMA IDENTIFIED IN ONE LYMPH NODE (0/1) Microscopic Comment 1. BREAST, INVASIVE TUMOR Procedure: Excision Laterality: Left Tumor Size: 0.9 x 0.8 x 0.7 cm Histologic Type: Invasive carcinoma of no special type (ductal, not otherwise specified) Grade: Nottingham Grade 2 Tubular Differentiation: 2 Nuclear Pleomorphism: 2 Mitotic Count: 2 Ductal Carcinoma in Situ (DCIS): Present (solid, cribriform patterns) Margins: Uninvolved by carcinoma Invasive carcinoma, distance from closest margin: 0.3 cm (posterior) DCIS, distance from closest margin: 0.4 cm (superior) Regional Lymph Nodes: Number of Lymph Nodes Examined: 4 Microscopic Comment(continued) Number of Sentinel Lymph Nodes Examined: 4 Lymph Nodes with Macrometastases: 0 Lymph Nodes with Micrometastases: 0 Lymph Nodes with Isolated Tumor Cells: 0 Breast Prognostic Profile: See [id number] (02/10/2017) Estrogen Receptor: Positive (100%, Strong) Progesterone Receptor: Positive (15%, Strong) Her2: Negative Ki-67: 15% Best tumor block for sendout testing: 1B Treatment Effect: No known presurgical therapy Pathologic Stage Classification (pTNM, AJCC 8th Edition): Primary Tumor: pT1b Regional Lymph Nodes: pN0 DAWN  Diagnosis 02/09/17 Breast, left, needle core biopsy -  INVASIVE DUCTAL CARCINOMA. - DUCTAL CARCINOMA IN SITU. - SEE COMMENT. Microscopic Comment The carcinoma appears grade I - II. A breast prognostic profile will be performed and the results reported separately. The results were called to [facility] on  02/10/2017. Results: IMMUNOHISTOCHEMICAL AND MORPHOMETRIC ANALYSIS PERFORMED MANUALLY Estrogen Receptor: 100%, POSITIVE, STRONG STAINING INTENSITY Progesterone Receptor: 15%, POSITIVE, STRONG STAINING INTENSITY Proliferation Marker Ki67: 15% REFERENCE RANGE ESTROGEN RECEPTOR NEGATIVE 0% POSITIVE =>1% REFERENCE RANGE PROGESTERONE RECEPTOR NEGATIVE 0% POSITIVE =>1% All controls stained appropriately Results: HER2 - NEGATIVE RATIO OF HER2/CEP17 SIGNALS 1.24 AVERAGE HER2 COPY NUMBER PER CELL 1.80 Reference Range: NEGATIVE HER2/CEP17 Ratio <2.0 and average HER2 copy number <4.0 EQUIVOCAL HER2/CEP17 Ratio <2.0 and average HER2 copy number >=4.0 and <6.0   ONCOTYPE 03/16/17    RADIOGRAPHIC STUDIES: I have personally reviewed the radiological images as listed and agreed with the findings in the report. No results found.  ASSESSMENT & PLAN:  ADAMARIS KING is 71 y.o. caucasian female who has a history of an MI, CHF and DM. She is here for her newly diagnosed Breast Cancer  1. 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 -the Oncotype Dx result was reviewed with her in details. She has intermedia risk based on the recurrence score 27, which predicts 10 year distant recurrence after 5 years of tamoxifen 18%. The benefit of chemotherapy in the intermedia risk group is small and controversial. Given her advanced age, comorbidities, strong ER and PR expression in her tumor, small benefit of chemotherapy, I did not recommend adjuvant chemotherapy. She and her family agrees with the plan -Giving the strong ER and PR expression in her postmenopausal status, I recommend adjuvant endocrine therapy with aromatase inhibitor or tamoxifen for a total of 5-10 years to reduce the risk of cancer recurrence. Potential  benefits and side effects of each agent were discussed with patient and she is interested. Due to her osteoporosis, an extensive cardiovascular disease, tamoxifen may be a better option for her. --She will proceed with adjuvant breast radiation next, I'll refer her back to Dr. Lisbeth Renshaw. -I plan to see her back after she completes radiation, and finalize her adjuvant endocrine therapy.     2. DM, poorly controlled -uncontrolled, she is somewhat oral medication and insulin -Follows up with Dr. Maudie Mercury her PCP  3. Mild thrombocytopenia -She has long-standing history of mild pancytopenia, related around 100, no history of significant bleeding.  -No history of liver disease or alcohol abuse. 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 -We'll continue monitoring.  4. Osteoporosis -Continue calcium and vitamin D  PLAN: -We discussed Oncotype test result, and decided not to pursue adjuvant chemotherapy  -refer to Dr. Lisbeth Renshaw to start Radiation -f/u with pt last week of radiation   No orders of the defined types were placed in this encounter.   All questions were answered. The patient knows to call the clinic with any problems, questions or concerns. I spent 30 minutes counseling the patient face to face. The total time spent in the appointment was 35 minutes and more than 50% was on counseling.  This document serves as a record of services personally performed by Truitt Merle, MD. It was created on her behalf by Joslyn Devon, a trained medical scribe. The creation of this record is based on the scribe's personal observations and the provider's statements to them.  This document has been checked and approved by the attending provider.     Truitt Merle, MD 04/20/2017 2:51 PM

## 2017-04-20 ENCOUNTER — Ambulatory Visit (HOSPITAL_BASED_OUTPATIENT_CLINIC_OR_DEPARTMENT_OTHER): Payer: Medicare Other | Admitting: Hematology

## 2017-04-20 VITALS — BP 128/60 | HR 58 | Temp 98.2°F | Resp 18 | Ht 65.0 in | Wt 123.8 lb

## 2017-04-20 DIAGNOSIS — Z170 Estrogen receptor positive status [ER+]: Secondary | ICD-10-CM | POA: Diagnosis not present

## 2017-04-20 DIAGNOSIS — E119 Type 2 diabetes mellitus without complications: Secondary | ICD-10-CM

## 2017-04-20 DIAGNOSIS — M810 Age-related osteoporosis without current pathological fracture: Secondary | ICD-10-CM

## 2017-04-20 DIAGNOSIS — D696 Thrombocytopenia, unspecified: Secondary | ICD-10-CM | POA: Diagnosis not present

## 2017-04-20 DIAGNOSIS — C50212 Malignant neoplasm of upper-inner quadrant of left female breast: Secondary | ICD-10-CM

## 2017-04-21 ENCOUNTER — Other Ambulatory Visit: Payer: Self-pay | Admitting: *Deleted

## 2017-04-22 ENCOUNTER — Encounter: Payer: Self-pay | Admitting: Radiation Oncology

## 2017-04-24 ENCOUNTER — Encounter: Payer: Self-pay | Admitting: Hematology

## 2017-05-02 NOTE — Progress Notes (Signed)
Location of Breast Cancer:Left Breast  Histology per Pathology Report: 03/16/2017   Receptor Status: ER(+), PR (+), Her2-neu (-), Ki-(15%)  Did patient present with symptoms (if so, please note symptoms) or was this found on screening mammography?: Screening  Past/Anticipated interventions by surgeon, if any: 03/16/2017 Procedure: INJECT BLUE DYE LEFT BREAST, LEFT BREAST LUMPECTOMY WITH RADIOACTIVE SEED AND LEFT AXILLARY DEEP SENTINEL LYMPH NODE  BIOPSY ADJACENT TISSUE TRANSFER;  Surgeon: [name], MD;  Location: [facility];  Service: General;  Laterality: Left;  Past/Anticipated interventions by medical oncology, if any: None, unless Oncotype results show need    Lymphedema issues, if any:  denies  Pain issues, if any:  no   SAFETY ISSUES:  Prior radiation? No  Pacemaker/ICD? No  Possible current pregnancy?No  Is the patient on methotrexate? No  Current Complaints / other details:    She had a abnormal benign mammogram 35 years ago and had surgery to remove the mass from the left breast. Pertinent Health History: CHF and MI in 2003 and has a stent.  Patient would like to know specifics on restrictions to left arm (bp, blood draws) and time frames so that she can inform other physicians.    Vitals:   05/05/17 0908  BP: 118/69  Pulse: (!) 58  Resp: 18  Temp: 97.7 F (36.5 C)  TempSrc: Oral  SpO2: 98%  Weight: 121 lb 6.4 oz (55.1 kg)    Wt Readings from Last 3 Encounters:  05/05/17 121 lb 6.4 oz (55.1 kg)  04/20/17 123 lb 12.8 oz (56.2 kg)  03/21/17 123 lb 12.8 oz (56.2 kg)

## 2017-05-03 ENCOUNTER — Encounter: Payer: Self-pay | Admitting: *Deleted

## 2017-05-05 ENCOUNTER — Ambulatory Visit
Admission: RE | Admit: 2017-05-05 | Discharge: 2017-05-05 | Disposition: A | Discharge status: Home / Self Care | Payer: Medicare Other | Source: Ambulatory Visit | Attending: Radiation Oncology | Admitting: Radiation Oncology

## 2017-05-05 ENCOUNTER — Encounter: Payer: Self-pay | Admitting: Radiation Oncology

## 2017-05-05 VITALS — BP 118/69 | HR 58 | Temp 97.7°F | Resp 18 | Wt 121.4 lb

## 2017-05-05 DIAGNOSIS — I252 Old myocardial infarction: Secondary | ICD-10-CM | POA: Diagnosis not present

## 2017-05-05 DIAGNOSIS — I110 Hypertensive heart disease with heart failure: Secondary | ICD-10-CM | POA: Diagnosis not present

## 2017-05-05 DIAGNOSIS — F17210 Nicotine dependence, cigarettes, uncomplicated: Secondary | ICD-10-CM | POA: Diagnosis not present

## 2017-05-05 DIAGNOSIS — Z820 Family history of epilepsy and other diseases of the nervous system: Secondary | ICD-10-CM | POA: Diagnosis not present

## 2017-05-05 DIAGNOSIS — Z841 Family history of disorders of kidney and ureter: Secondary | ICD-10-CM | POA: Diagnosis not present

## 2017-05-05 DIAGNOSIS — I2510 Atherosclerotic heart disease of native coronary artery without angina pectoris: Secondary | ICD-10-CM | POA: Diagnosis not present

## 2017-05-05 DIAGNOSIS — Z170 Estrogen receptor positive status [ER+]: Principal | ICD-10-CM

## 2017-05-05 DIAGNOSIS — Z7982 Long term (current) use of aspirin: Secondary | ICD-10-CM | POA: Diagnosis not present

## 2017-05-05 DIAGNOSIS — Z888 Allergy status to other drugs, medicaments and biological substances status: Secondary | ICD-10-CM | POA: Diagnosis not present

## 2017-05-05 DIAGNOSIS — Z794 Long term (current) use of insulin: Secondary | ICD-10-CM | POA: Diagnosis not present

## 2017-05-05 DIAGNOSIS — C50212 Malignant neoplasm of upper-inner quadrant of left female breast: Secondary | ICD-10-CM

## 2017-05-05 DIAGNOSIS — Z79899 Other long term (current) drug therapy: Secondary | ICD-10-CM | POA: Diagnosis not present

## 2017-05-05 DIAGNOSIS — Z8249 Family history of ischemic heart disease and other diseases of the circulatory system: Secondary | ICD-10-CM | POA: Diagnosis not present

## 2017-05-05 DIAGNOSIS — Z510 Encounter for antineoplastic radiation therapy: Secondary | ICD-10-CM | POA: Diagnosis present

## 2017-05-05 DIAGNOSIS — Z9889 Other specified postprocedural states: Secondary | ICD-10-CM | POA: Diagnosis not present

## 2017-05-05 DIAGNOSIS — N6489 Other specified disorders of breast: Secondary | ICD-10-CM | POA: Diagnosis not present

## 2017-05-05 DIAGNOSIS — E785 Hyperlipidemia, unspecified: Secondary | ICD-10-CM | POA: Diagnosis not present

## 2017-05-05 DIAGNOSIS — I509 Heart failure, unspecified: Secondary | ICD-10-CM | POA: Diagnosis not present

## 2017-05-05 DIAGNOSIS — E119 Type 2 diabetes mellitus without complications: Secondary | ICD-10-CM | POA: Diagnosis not present

## 2017-05-05 NOTE — Progress Notes (Signed)
Radiation Oncology         [phone] ________________________________  Name: Monica [name] MRN: [id number]  Date: 05/05/2017  DOB: [birth year]  CC:Kim, Jeneen Rinks, MD  Truitt Merle, MD     REFERRING PHYSICIAN: Truitt Merle, MD   DIAGNOSIS: The encounter diagnosis was Malignant neoplasm of upper-inner quadrant of left breast in female, estrogen receptor positive ([place]).   HISTORY OF PRESENT ILLNESS: Monica [name] is a 71 y.o. female originally seen in the multidisciplinary breast conference for a new diagnosis of left breast cancer. The patient had a screening asymmetry of the left breast. She was found at 11 o'clock to have a mass, and diagnostic imaging revealed a 9 mm lesion at the 11 o'clock position. The axilla was negative for adenopathy by ultrasound. 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 She has undergone left lumpectomy with sentinel node assessment on 03/16/17 and her final tumor size was 9 mm, grade 2, and of 4 sampled nodes sampled, none contained disease. Her margins were also negative. Her oncotype score was 27, but she is not planning on chemotherapy. She comes today to discuss options of adjuvant radiotherapy.   PREVIOUS RADIATION THERAPY: No   PAST MEDICAL HISTORY:  Past Medical History:  Diagnosis Date  . CAD (coronary artery disease)    Cypher stent 09/2002  . CHF (congestive heart failure) ([place])   . Diabetes mellitus ([place])   . Hyperlipidemia   . Hypertension   . Myocardial infarction ([place])    2003,went into cardiac shock  . Osteoporosis   . Sinusitis        PAST SURGICAL HISTORY: Past Surgical History:  Procedure Laterality Date  . ABDOMINAL HYSTERECTOMY    . APPENDECTOMY    . BREAST LUMPECTOMY WITH RADIOACTIVE SEED AND SENTINEL LYMPH NODE BIOPSY Left 03/16/2017   Procedure: INJECT BLUE DYE LEFT BREAST, LEFT BREAST LUMPECTOMY WITH RADIOACTIVE SEED AND LEFT AXILLARY DEEP SENTINEL  LYMPH NODE  BIOPSY ADJACENT TISSUE TRANSFER;  Surgeon: [name], MD;  Location: [facility];  Service: General;  Laterality: Left;  . CARPAL TUNNEL RELEASE Bilateral   . COLONOSCOPY WITH PROPOFOL N/A 05/30/2015   Procedure: COLONOSCOPY WITH PROPOFOL;  Surgeon: Carol Ada, MD;  [facility];  Service: Endoscopy;  Laterality: N/A;  . CORONARY ANGIOPLASTY WITH STENT PLACEMENT  10-02-2002  . Exploratory abdominal     Bleeding in abdomen after tubal  . TUBAL LIGATION    . UMBILICAL HERNIA REPAIR       FAMILY HISTORY:  Family History  Problem Relation Age of Onset  . Dementia Mother   . Kidney disease Mother   . Heart attack Father 42       Died age 73  . Heart attack Brother 69  . Heart attack Brother 4     SOCIAL HISTORY:  reports that she quit smoking about 8 weeks ago. Her smoking use included Cigarettes. She has a 60.00 pack-year smoking history. She uses smokeless tobacco. She reports that she does not drink alcohol or use drugs. The patient is married and lives in [place].    ALLERGIES: Ace inhibitors; Keflex [cephalexin]; Phenobarbital; Sulfate; and Altace [ramipril]   MEDICATIONS:  Current Outpatient Prescriptions  Medication Sig Dispense Refill  . acetaminophen (TYLENOL) 500 MG tablet Take 1,000 mg by mouth every 6 (six) hours as needed for moderate pain or headache.    Marland Kitchen aspirin EC 81 MG tablet Take 81  mg by mouth at bedtime.    Marland Kitchen atorvastatin (LIPITOR) 40 MG tablet Take 40 mg by mouth at bedtime.    . Calcium Carbonate-Vitamin D 600-400 MG-UNIT per tablet Take 1 tablet by mouth 2 (two) times daily.    . carvedilol (COREG) 3.125 MG tablet Take 3.125 mg by mouth 2 (two) times daily.     . clonazePAM (KLONOPIN) 1 MG tablet Take 1 mg by mouth at bedtime.     Marland Kitchen glimepiride (AMARYL) 4 MG tablet Take 4 mg by mouth 2 (two) times daily.    . halobetasol (ULTRAVATE) 0.05 % cream Apply 1 application topically 2 (two) times daily. Applied to rash on leg (ankle  area)    . Insulin Detemir (LEVEMIR) 100 UNIT/ML Pen Inject 17-25 Units into the skin 2 (two) times daily. 25 units in the morning & 17 units at night    . JANUVIA 100 MG tablet Take 100 mg by mouth at bedtime.     Marland Kitchen losartan (COZAAR) 50 MG tablet Take 50 mg by mouth at bedtime.     . metFORMIN (GLUCOPHAGE-XR) 500 MG 24 hr tablet Take 500 mg by mouth 2 (two) times daily.    . Multiple Vitamin (MULTIVITAMIN WITH MINERALS) TABS tablet Take 1 tablet by mouth at bedtime.    . Omega-3 Fatty Acids (FISH OIL) 1200 MG CAPS Take 2,400 mg by mouth 2 (two) times daily.    Marland Kitchen omeprazole (PRILOSEC) 20 MG capsule Take 20 mg by mouth at bedtime.     Glory Rosebush DELICA LANCETS 28F MISC 1 each by Other route 3 (three) times daily.     Glory Rosebush VERIO test strip 1 each by Other route 3 (three) times daily.     . pioglitazone (ACTOS) 30 MG tablet Take 30 mg by mouth at bedtime.    . risedronate (ACTONEL) 150 MG tablet Take 150 mg by mouth every 30 (thirty) days.    Marland Kitchen sertraline (ZOLOFT) 100 MG tablet Take 100 mg by mouth at bedtime.    . nitroGLYCERIN (NITROSTAT) 0.4 MG SL tablet Place 1 tablet (0.4 mg total) under the tongue every 5 (five) minutes as needed for chest pain. (Patient not taking: Reported on 05/05/2017) 25 tablet 1  . Polyethyl Glycol-Propyl Glycol (SYSTANE) 0.4-0.3 % SOLN Place 1 drop into both eyes 2 (two) times daily.     No current facility-administered medications for this encounter.      REVIEW OF SYSTEMS: On review of systems, the patient reports that she feels great and from surgery, is doing well overall. She denies any chest pain, shortness of breath, cough, fevers, chills, night sweats, unintended weight changes. She denies any bowel or bladder disturbances, and denies abdominal pain, nausea or vomiting. She denies any new musculoskeletal or joint aches or pains, new skin lesions or concerns. A complete review of systems is obtained and is otherwise negative.     PHYSICAL EXAM:  Wt  Readings from Last 3 Encounters:  05/05/17 121 lb 6.4 oz (55.1 kg)  04/20/17 123 lb 12.8 oz (56.2 kg)  03/21/17 123 lb 12.8 oz (56.2 kg)   Temp Readings from Last 3 Encounters:  05/05/17 97.7 F (36.5 C) (Oral)  04/20/17 98.2 F (36.8 C) (Oral)  03/16/17 97.7 F (36.5 C)   BP Readings from Last 3 Encounters:  05/05/17 118/69  04/20/17 128/60  03/21/17 138/74   Pulse Readings from Last 3 Encounters:  05/05/17 (!) 58  04/20/17 (!) 58  03/21/17 (!) 59  Pain Assessment Pain Score: 0-No pain/10  In general this is a well appearing caucasian female in no acute distress. She's alert and oriented x4 and appropriate throughout the examination. Cardiopulmonary assessment is negative for acute distress and she exhibits normal effort. The left breast reveals a well healed lumpectomy and axillary incision without cellulitic change or chest wall or LUE edema.   ECOG = 0  0 - Asymptomatic (Fully active, able to carry on all predisease activities without restriction)  1 - Symptomatic but completely ambulatory (Restricted in physically strenuous activity but ambulatory and able to carry out work of a light or sedentary nature. For example, light housework, office work)  2 - Symptomatic, <50% in bed during the day (Ambulatory and capable of all self care but unable to carry out any work activities. Up and about more than 50% of waking hours)  3 - Symptomatic, >50% in bed, but not bedbound (Capable of only limited self-care, confined to bed or chair 50% or more of waking hours)  4 - Bedbound (Completely disabled. Cannot carry on any self-care. Totally confined to bed or chair)  5 - Death   Eustace Pen MM, Creech RH, Tormey DC, et al. [id number]). "Toxicity and response criteria of the [facility]". [place] Oncol. 5 (6): 649-55    LABORATORY DATA:  Lab Results  Component Value Date   WBC 6.7 03/09/2017   HGB 14.8 03/09/2017   HCT 45.3 03/09/2017   MCV 80.7 03/09/2017    PLT 121 (L) 03/09/2017   Lab Results  Component Value Date   NA 135 03/09/2017   K 4.0 03/09/2017   CL 100 (L) 03/09/2017   CO2 25 03/09/2017   Lab Results  Component Value Date   ALT 39 07/09/2016   AST 40 07/09/2016   ALKPHOS 50 07/09/2016   BILITOT 0.6 07/09/2016      RADIOGRAPHY: No results found.     IMPRESSION/PLAN: 1. 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 Dr. Lisbeth Renshaw discusses the final pathology findings and reviews the nature of invasive breast disease, and reviews the discussion that Dr. Burr Medico has had with the patient. She is not planning on chemotherapy, and we would recommend at this time, proceeding with adjuvant external radiotherapy to the breast followed by antiestrogen therapy. We discussed the risks, benefits, short, and long term effects of radiotherapy, and the patient is interested in proceeding. Dr. Lisbeth Renshaw discusses the delivery and logistics of radiotherapy, and would recommend a course of 4 weeks of treatment with deep inspiration breath hold. Simulation will occur on 05/11/17 at 11 am. Written consent is obtained and placed in the chart, a copy was provided to the patient.  In a visit lasting 25 minutes, greater than 50% of the time was spent face to face discussing her pathology and need for radiotherapy, and coordinating the patient's care.   The above documentation reflects my direct findings during this shared patient visit. Please see the separate note by Dr. Lisbeth Renshaw on this date for the remainder of the patient's plan of care.    Carola Rhine, PAC

## 2017-05-05 NOTE — Progress Notes (Signed)
See MD note for nurse evaluation.

## 2017-05-11 ENCOUNTER — Ambulatory Visit
Admission: RE | Admit: 2017-05-11 | Discharge: 2017-05-11 | Disposition: A | Discharge status: Home / Self Care | Payer: Medicare Other | Source: Ambulatory Visit | Attending: Radiation Oncology | Admitting: Radiation Oncology

## 2017-05-11 DIAGNOSIS — Z170 Estrogen receptor positive status [ER+]: Principal | ICD-10-CM

## 2017-05-11 DIAGNOSIS — Z510 Encounter for antineoplastic radiation therapy: Secondary | ICD-10-CM | POA: Diagnosis not present

## 2017-05-11 DIAGNOSIS — C50212 Malignant neoplasm of upper-inner quadrant of left female breast: Secondary | ICD-10-CM

## 2017-05-13 DIAGNOSIS — Z510 Encounter for antineoplastic radiation therapy: Secondary | ICD-10-CM | POA: Diagnosis not present

## 2017-05-18 ENCOUNTER — Ambulatory Visit
Admission: RE | Admit: 2017-05-18 | Discharge: 2017-05-18 | Disposition: A | Discharge status: Home / Self Care | Payer: Medicare Other | Source: Ambulatory Visit | Attending: Radiation Oncology | Admitting: Radiation Oncology

## 2017-05-18 DIAGNOSIS — F17210 Nicotine dependence, cigarettes, uncomplicated: Secondary | ICD-10-CM | POA: Insufficient documentation

## 2017-05-18 DIAGNOSIS — Z794 Long term (current) use of insulin: Secondary | ICD-10-CM | POA: Insufficient documentation

## 2017-05-18 DIAGNOSIS — Z820 Family history of epilepsy and other diseases of the nervous system: Secondary | ICD-10-CM | POA: Insufficient documentation

## 2017-05-18 DIAGNOSIS — Z841 Family history of disorders of kidney and ureter: Secondary | ICD-10-CM | POA: Insufficient documentation

## 2017-05-18 DIAGNOSIS — E119 Type 2 diabetes mellitus without complications: Secondary | ICD-10-CM | POA: Insufficient documentation

## 2017-05-18 DIAGNOSIS — I509 Heart failure, unspecified: Secondary | ICD-10-CM | POA: Diagnosis not present

## 2017-05-18 DIAGNOSIS — N6489 Other specified disorders of breast: Secondary | ICD-10-CM | POA: Diagnosis not present

## 2017-05-18 DIAGNOSIS — Z170 Estrogen receptor positive status [ER+]: Secondary | ICD-10-CM | POA: Diagnosis not present

## 2017-05-18 DIAGNOSIS — I252 Old myocardial infarction: Secondary | ICD-10-CM | POA: Diagnosis not present

## 2017-05-18 DIAGNOSIS — Z7982 Long term (current) use of aspirin: Secondary | ICD-10-CM | POA: Diagnosis not present

## 2017-05-18 DIAGNOSIS — Z888 Allergy status to other drugs, medicaments and biological substances status: Secondary | ICD-10-CM | POA: Diagnosis not present

## 2017-05-18 DIAGNOSIS — E785 Hyperlipidemia, unspecified: Secondary | ICD-10-CM | POA: Insufficient documentation

## 2017-05-18 DIAGNOSIS — I2510 Atherosclerotic heart disease of native coronary artery without angina pectoris: Secondary | ICD-10-CM | POA: Insufficient documentation

## 2017-05-18 DIAGNOSIS — C50212 Malignant neoplasm of upper-inner quadrant of left female breast: Secondary | ICD-10-CM | POA: Diagnosis not present

## 2017-05-18 DIAGNOSIS — Z79899 Other long term (current) drug therapy: Secondary | ICD-10-CM | POA: Diagnosis not present

## 2017-05-18 DIAGNOSIS — Z9889 Other specified postprocedural states: Secondary | ICD-10-CM | POA: Diagnosis not present

## 2017-05-18 DIAGNOSIS — Z8249 Family history of ischemic heart disease and other diseases of the circulatory system: Secondary | ICD-10-CM | POA: Insufficient documentation

## 2017-05-18 DIAGNOSIS — I110 Hypertensive heart disease with heart failure: Secondary | ICD-10-CM | POA: Insufficient documentation

## 2017-05-18 DIAGNOSIS — Z510 Encounter for antineoplastic radiation therapy: Secondary | ICD-10-CM | POA: Insufficient documentation

## 2017-05-18 MED ORDER — RADIAPLEXRX EX GEL
Freq: Once | CUTANEOUS | Status: AC
  Administered 2017-05-18: 14:00:00 via TOPICAL

## 2017-05-18 MED ORDER — ALRA NON-METALLIC DEODORANT (RAD-ONC)
1.0000 "application " | Freq: Once | TOPICAL | Status: AC
  Administered 2017-05-18: 1 via TOPICAL

## 2017-05-18 NOTE — Progress Notes (Signed)
Patient teaching reviewed  areas of pertinence such as fatigue, hair loss, skin changes, breast tenderness, breast swelling.  Pt able to give teach back of to pat skin, use unscented/gentle soap, and drink plenty of water,apply Radiaplex bid, avoid applying anything to skin within 4 hours of treatment, avoid wearing an under wire bra and to use an electric razor if they must shave. Pt demonstrated understanding of information given and will contact nursing with any questions or concerns.   Denies any of the mentioned areas of pertinence as concerns.  Given business card for [employer], R.N.  Skin to breast normal color skin intact will start using Radiaplex gel this afternoon and deodorant  Tomorrow. 2:18 PM

## 2017-05-19 ENCOUNTER — Ambulatory Visit
Admission: RE | Admit: 2017-05-19 | Discharge: 2017-05-19 | Disposition: A | Discharge status: Home / Self Care | Payer: Medicare Other | Source: Ambulatory Visit | Attending: Radiation Oncology | Admitting: Radiation Oncology

## 2017-05-19 DIAGNOSIS — Z510 Encounter for antineoplastic radiation therapy: Secondary | ICD-10-CM | POA: Diagnosis not present

## 2017-05-20 ENCOUNTER — Ambulatory Visit
Admission: RE | Admit: 2017-05-20 | Discharge: 2017-05-20 | Disposition: A | Discharge status: Home / Self Care | Payer: Medicare Other | Source: Ambulatory Visit | Attending: Radiation Oncology | Admitting: Radiation Oncology

## 2017-05-20 DIAGNOSIS — Z510 Encounter for antineoplastic radiation therapy: Secondary | ICD-10-CM | POA: Diagnosis not present

## 2017-05-23 ENCOUNTER — Ambulatory Visit
Admission: RE | Admit: 2017-05-23 | Discharge: 2017-05-23 | Disposition: A | Discharge status: Home / Self Care | Payer: Medicare Other | Source: Ambulatory Visit | Attending: Radiation Oncology | Admitting: Radiation Oncology

## 2017-05-23 DIAGNOSIS — Z510 Encounter for antineoplastic radiation therapy: Secondary | ICD-10-CM | POA: Diagnosis not present

## 2017-05-24 ENCOUNTER — Emergency Department (HOSPITAL_COMMUNITY): Payer: Medicare Other

## 2017-05-24 ENCOUNTER — Ambulatory Visit
Admission: RE | Admit: 2017-05-24 | Discharge: 2017-05-24 | Disposition: A | Discharge status: Home / Self Care | Payer: Medicare Other | Source: Ambulatory Visit | Attending: Radiation Oncology | Admitting: Radiation Oncology

## 2017-05-24 ENCOUNTER — Emergency Department (HOSPITAL_COMMUNITY)
Admission: EM | Admit: 2017-05-24 | Discharge: 2017-05-24 | Disposition: A | Discharge status: Home / Self Care | Payer: Medicare Other | Attending: Emergency Medicine | Admitting: Emergency Medicine

## 2017-05-24 DIAGNOSIS — Z79899 Other long term (current) drug therapy: Secondary | ICD-10-CM | POA: Insufficient documentation

## 2017-05-24 DIAGNOSIS — E785 Hyperlipidemia, unspecified: Secondary | ICD-10-CM | POA: Diagnosis not present

## 2017-05-24 DIAGNOSIS — Z510 Encounter for antineoplastic radiation therapy: Secondary | ICD-10-CM | POA: Diagnosis not present

## 2017-05-24 DIAGNOSIS — I2510 Atherosclerotic heart disease of native coronary artery without angina pectoris: Secondary | ICD-10-CM | POA: Insufficient documentation

## 2017-05-24 DIAGNOSIS — Z87891 Personal history of nicotine dependence: Secondary | ICD-10-CM | POA: Diagnosis not present

## 2017-05-24 DIAGNOSIS — I509 Heart failure, unspecified: Secondary | ICD-10-CM | POA: Insufficient documentation

## 2017-05-24 DIAGNOSIS — Z794 Long term (current) use of insulin: Secondary | ICD-10-CM | POA: Insufficient documentation

## 2017-05-24 DIAGNOSIS — I110 Hypertensive heart disease with heart failure: Secondary | ICD-10-CM | POA: Diagnosis not present

## 2017-05-24 DIAGNOSIS — E119 Type 2 diabetes mellitus without complications: Secondary | ICD-10-CM | POA: Insufficient documentation

## 2017-05-24 DIAGNOSIS — R569 Unspecified convulsions: Secondary | ICD-10-CM | POA: Diagnosis present

## 2017-05-24 DIAGNOSIS — Z7982 Long term (current) use of aspirin: Secondary | ICD-10-CM | POA: Diagnosis not present

## 2017-05-24 LAB — CBC WITH DIFFERENTIAL/PLATELET
Basophils Absolute: 0 10*3/uL (ref 0.0–0.1)
Basophils Relative: 0 %
EOS PCT: 2 %
Eosinophils Absolute: 0.2 10*3/uL (ref 0.0–0.7)
HEMATOCRIT: 43.5 % (ref 36.0–46.0)
Hemoglobin: 15.2 g/dL — ABNORMAL HIGH (ref 12.0–15.0)
LYMPHS ABS: 1.5 10*3/uL (ref 0.7–4.0)
Lymphocytes Relative: 20 %
MCH: 26.6 pg (ref 26.0–34.0)
MCHC: 34.9 g/dL (ref 30.0–36.0)
MCV: 76.2 fL — AB (ref 78.0–100.0)
MONO ABS: 0.5 10*3/uL (ref 0.1–1.0)
MONOS PCT: 6 %
NEUTROS ABS: 5.3 10*3/uL (ref 1.7–7.7)
Neutrophils Relative %: 72 %
PLATELETS: 115 10*3/uL — AB (ref 150–400)
RBC: 5.71 MIL/uL — AB (ref 3.87–5.11)
RDW: 14.9 % (ref 11.5–15.5)
WBC: 7.5 10*3/uL (ref 4.0–10.5)

## 2017-05-24 LAB — COMPREHENSIVE METABOLIC PANEL
ALT: 30 U/L (ref 14–54)
AST: 33 U/L (ref 15–41)
Albumin: 4.3 g/dL (ref 3.5–5.0)
Alkaline Phosphatase: 40 U/L (ref 38–126)
Anion gap: 8 (ref 5–15)
BILIRUBIN TOTAL: 0.7 mg/dL (ref 0.3–1.2)
BUN: 11 mg/dL (ref 6–20)
CHLORIDE: 106 mmol/L (ref 101–111)
CO2: 26 mmol/L (ref 22–32)
Calcium: 9.6 mg/dL (ref 8.9–10.3)
Creatinine, Ser: 0.47 mg/dL (ref 0.44–1.00)
GFR calc Af Amer: >60 mL/min (ref >60)
GFR calc non Af Amer: >60 mL/min (ref >60)
GLUCOSE: 103 mg/dL — AB (ref 65–99)
POTASSIUM: 3.9 mmol/L (ref 3.5–5.1)
Sodium: 140 mmol/L (ref 135–145)
Total Protein: 7.2 g/dL (ref 6.5–8.1)

## 2017-05-24 LAB — CBG MONITORING, ED
GLUCOSE-CAPILLARY: 99 mg/dL (ref 65–99)
Glucose-Capillary: 107 mg/dL — ABNORMAL HIGH (ref 65–99)

## 2017-05-24 MED ORDER — SODIUM CHLORIDE 0.9 % IV SOLN
INTRAVENOUS | Status: DC
  Administered 2017-05-24: 20 mL/h via INTRAVENOUS

## 2017-05-24 NOTE — ED Notes (Signed)
Patient awake and alert with family at bedside. Patient assisted to bathroom with steady gate. Patient has no complaints-no seizure activity.

## 2017-05-24 NOTE — ED Notes (Signed)
Explained to daughter and patient that she needs to eat 6-8 small meals a day and use protein shakes 4 times a day to keep blood sugar levels WNL's. Encouraged patient to eat tonight after discharge, daughter states she is getting her food and will pick up some protein shakes. Instructed patient to call her primary care in the am to discuss any changes to her diabetes medication.

## 2017-05-24 NOTE — ED Provider Notes (Signed)
[facility] DEPT Provider Note   CSN: [id number] Arrival date & time: 05/24/17  1553     History   Chief Complaint No chief complaint on file.   HPI Monica [name] is a 71 y.o. female.  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 CAD (coronary artery disease)    Cypher stent 09/2002  . CHF (congestive heart failure) ([place])   . Diabetes mellitus ([place])   . Hyperlipidemia   . Hypertension   . Myocardial infarction ([place])    2003,went into cardiac shock  . Osteoporosis   . Sinusitis     Patient Active Problem List   Diagnosis Date Noted  . Dyslipidemia 03/21/2017  . Tobacco abuse 03/21/2017  . Type 2 diabetes mellitus with complication, with long-term current use of insulin ([place]) 03/21/2017  . Malignant neoplasm of upper-inner quadrant of left breast in female, estrogen receptor positive ([place]) 02/15/2017  . CAD (coronary artery disease) 03/12/2015    Past Surgical History:  Procedure Laterality Date  . ABDOMINAL HYSTERECTOMY    . APPENDECTOMY    . BREAST LUMPECTOMY WITH RADIOACTIVE SEED AND SENTINEL LYMPH NODE BIOPSY Left 03/16/2017   Procedure: INJECT BLUE DYE LEFT BREAST, LEFT BREAST LUMPECTOMY WITH RADIOACTIVE SEED AND LEFT AXILLARY DEEP SENTINEL LYMPH NODE  BIOPSY ADJACENT TISSUE TRANSFER;  Surgeon: [name], MD;  Location: [facility];  Service: General;  Laterality: Left;  . CARPAL TUNNEL RELEASE Bilateral   . COLONOSCOPY WITH PROPOFOL N/A 05/30/2015   Procedure: COLONOSCOPY WITH PROPOFOL;   Surgeon: Carol Ada, MD;  [facility];  Service: Endoscopy;  Laterality: N/A;  . CORONARY ANGIOPLASTY WITH STENT PLACEMENT  10-02-2002  . Exploratory abdominal     Bleeding in abdomen after tubal  . TUBAL LIGATION    . UMBILICAL HERNIA REPAIR      OB History    No data available       Home Medications    Prior to Admission medications   Medication Sig Start Date End Date Taking? Authorizing Provider  acetaminophen (TYLENOL) 500 MG tablet Take 1,000 mg by mouth every 6 (six) hours as needed for moderate pain or headache.    [provider]  aspirin EC 81 MG tablet Take 81 mg by mouth at bedtime.    [provider]  atorvastatin (LIPITOR) 40 MG tablet Take 40 mg by mouth at bedtime. 12/07/16   [provider]  Calcium Carbonate-Vitamin D 600-400 MG-UNIT per tablet Take 1 tablet by mouth 2 (two) times daily.    [provider]  carvedilol (COREG) 3.125 MG tablet Take 3.125 mg by mouth 2 (two) times daily.     [provider]  clonazePAM (KLONOPIN) 1 MG tablet Take 1 mg by mouth at bedtime.  03/02/16   [provider]  glimepiride (AMARYL) 4 MG tablet Take 4 mg by mouth 2 (two) times daily. 03/03/16   [provider]  halobetasol (ULTRAVATE) 0.05 % cream Apply 1 application topically 2 (two) times daily. Applied  to rash on leg (ankle area)    [provider]  hyaluronate sodium (RADIAPLEXRX) GEL Apply 1 application topically 2 (two) times daily.    [provider]  Insulin Detemir (LEVEMIR) 100 UNIT/ML Pen Inject 17-25 Units into the skin 2 (two) times daily. 25 units in the morning & 17 units at night    [provider]  JANUVIA 100 MG tablet Take 100 mg by mouth at bedtime.  02/02/17   [provider]  losartan (COZAAR) 50 MG tablet Take 50 mg by mouth at bedtime.     [provider]  metFORMIN (GLUCOPHAGE-XR) 500 MG 24 hr tablet Take 500 mg by mouth 2 (two) times daily.  02/02/17   [provider]  Multiple Vitamin (MULTIVITAMIN WITH MINERALS) TABS tablet Take 1 tablet by mouth at bedtime.    [provider]  nitroGLYCERIN (NITROSTAT) 0.4 MG SL tablet Place 1 tablet (0.4 mg total) under the tongue every 5 (five) minutes as needed for chest pain. Patient not taking: Reported on 05/05/2017 03/21/17 06/19/17  Minus Breeding, MD  non-metallic deodorant Jethro Poling) MISC Apply 1 application topically daily as needed.    [provider]  Omega-3 Fatty Acids (FISH OIL) 1200 MG CAPS Take 2,400 mg by mouth 2 (two) times daily.    [provider]  omeprazole (PRILOSEC) 20 MG capsule Take 20 mg by mouth at bedtime.     [provider]  [facility] DELICA LANCETS 62X MISC 1 each by Other route 3 (three) times daily.  01/02/17   [provider]  [facility] VERIO test strip 1 each by Other route 3 (three) times daily.  01/02/17   [provider]  pioglitazone (ACTOS) 30 MG tablet Take 30 mg by mouth at bedtime. 02/23/17   [provider]  Polyethyl Glycol-Propyl Glycol (SYSTANE) 0.4-0.3 % SOLN Place 1 drop into both eyes 2 (two) times daily.    [provider]  risedronate (ACTONEL) 150 MG tablet Take 150 mg by mouth every 30 (thirty) days. 03/02/16   [provider]  sertraline (ZOLOFT) 100 MG tablet Take 100 mg by mouth at bedtime. 02/02/17   [provider]    Family History Family History  Problem Relation Age of Onset  . Dementia Mother   . Kidney disease Mother   . Heart attack Father 48       Died age 46  . Heart attack Brother 2  . Heart attack Brother 59    Social History Social History  Substance Use Topics  . Smoking status: Former Smoker    Packs/day: 2.00    Years: 30.00    Types: Cigarettes    Quit date: 03/09/2017  . Smokeless tobacco: Current User     Comment: e cig  . Alcohol use No     Allergies   Ace inhibitors; Keflex [cephalexin]; Phenobarbital; Sulfate; and  Altace [ramipril]   Review of Systems Review of Systems  All other systems reviewed and are negative.    Physical Exam Updated Vital Signs SpO2 95%   Physical Exam  Constitutional: She appears well-developed and well-nourished.  Non-toxic appearance. No distress.  HENT:  Head: Normocephalic and atraumatic.  Eyes: Pupils are equal, round, and reactive to light. Conjunctivae, EOM and lids are normal.  Neck: Normal range of motion. Neck supple. No tracheal deviation present. No thyroid mass present.  Cardiovascular: Normal rate, regular rhythm and normal heart sounds.  Exam reveals no gallop.   No murmur heard. Pulmonary/Chest: Effort  normal and breath sounds normal. No stridor. No respiratory distress. She has no decreased breath sounds. She has no wheezes. She has no rhonchi. She has no rales.  Abdominal: Soft. Normal appearance and bowel sounds are normal. She exhibits no distension. There is no tenderness. There is no rebound and no CVA tenderness.  Musculoskeletal: Normal range of motion. She exhibits no edema or tenderness.  Neurological: She is alert. She has normal strength. She is disoriented. She displays no tremor. No cranial nerve deficit or sensory deficit. She exhibits normal muscle tone. She displays a negative Romberg sign. GCS eye subscore is 4. GCS verbal subscore is 5. GCS motor subscore is 6.  Skin: Skin is warm and dry. No abrasion and no rash noted.  Psychiatric: Her affect is blunt. Her speech is delayed. She is slowed. She is inattentive.  Nursing note and vitals reviewed.    ED Treatments / Results  Labs (all labs ordered are listed, but only abnormal results are displayed) Labs Reviewed  CBC WITH DIFFERENTIAL/PLATELET  COMPREHENSIVE METABOLIC PANEL    EKG  EKG Interpretation None       Radiology No results found.  Procedures Procedures (including critical care time)  Medications Ordered in ED Medications  0.9 %  sodium chloride infusion  (not administered)     Initial Impression / Assessment and Plan / ED Course  I have reviewed the triage vital signs and the nursing notes.  Pertinent labs & imaging results that were available during my care of the patient were reviewed by me and considered in my medical decision making (see chart for details).     Patient's blood sugar stable here. Head CT negative. Patient admits that she has not been eating a proper diabetic diet. She is at her neurological baseline according to family. Stable for discharge  Final Clinical Impressions(s) / ED Diagnoses   Final diagnoses:  None    New Prescriptions New Prescriptions   No medications on file     Lacretia Leigh, MD 05/24/17 [phone]

## 2017-05-24 NOTE — ED Triage Notes (Signed)
Pt states she has had one seizure in the past several years ago, cant remember specific date. Patient states she feels a little confused however she is A&O x4. Pt resting not obvious distress at time of triage.

## 2017-05-24 NOTE — ED Triage Notes (Signed)
Per EMs- patient had a seizure at [facility]. Patient did not fall or hit her head. Patient's CBG-52 upon arrival. EMS agave oral glucose and CBG increased to 99. Patient had chemo or radiation today, patient's husband unsure of which for breast cancer. patient is post ictal.

## 2017-05-25 ENCOUNTER — Ambulatory Visit
Admission: RE | Admit: 2017-05-25 | Discharge: 2017-05-25 | Disposition: A | Discharge status: Home / Self Care | Payer: Medicare Other | Source: Ambulatory Visit | Attending: Radiation Oncology | Admitting: Radiation Oncology

## 2017-05-25 DIAGNOSIS — Z510 Encounter for antineoplastic radiation therapy: Secondary | ICD-10-CM | POA: Diagnosis not present

## 2017-05-26 ENCOUNTER — Ambulatory Visit
Admission: RE | Admit: 2017-05-26 | Discharge: 2017-05-26 | Disposition: A | Discharge status: Home / Self Care | Payer: Medicare Other | Source: Ambulatory Visit | Attending: Radiation Oncology | Admitting: Radiation Oncology

## 2017-05-26 DIAGNOSIS — Z510 Encounter for antineoplastic radiation therapy: Secondary | ICD-10-CM | POA: Diagnosis not present

## 2017-05-27 ENCOUNTER — Ambulatory Visit
Admission: RE | Admit: 2017-05-27 | Discharge: 2017-05-27 | Disposition: A | Discharge status: Home / Self Care | Payer: Medicare Other | Source: Ambulatory Visit | Attending: Radiation Oncology | Admitting: Radiation Oncology

## 2017-05-27 DIAGNOSIS — Z510 Encounter for antineoplastic radiation therapy: Secondary | ICD-10-CM | POA: Diagnosis not present

## 2017-05-27 NOTE — Progress Notes (Signed)
  Radiation Oncology         [phone] ________________________________  Name: LADANA CHAVERO MRN: [id number]  Date: 05/11/2017  DOB: January 01, 1946   DIAGNOSIS:     ICD-10-CM   1. Malignant neoplasm of upper-inner quadrant of left breast in female, estrogen receptor positive ([place]) C50.212    Z17.0     SIMULATION AND TREATMENT PLANNING NOTE  The patient presented for simulation prior to beginning her course of radiation treatment for her diagnosis of left-sided breast cancer. The patient was placed in a supine position on a breast board. A customized vac-lock bag was constructed and this complex treatment device will be used on a daily basis during her treatment. In this fashion, a CT scan was obtained through the chest area and an isocenter was placed near the chest wall within the breast.  The patient will be planned to receive a course of radiation initially to a dose of 42.5 Gy. This will consist of a whole breast radiotherapy technique. To accomplish this, 2 customized blocks have been designed which will correspond to medial and lateral whole breast tangent fields. This treatment will be accomplished at 2.5 Gy per fraction. A forward planning technique will also be evaluated to determine if this approach improves the plan. It is anticipated that the patient will then receive a 7.5 Gy boost to the seroma cavity which has been contoured. This will be accomplished at 2.5 Gy per fraction.   This initial treatment will consist of a 3-D conformal technique. The seroma has been contoured as the primary target structure. Additionally, dose volume histograms of both this target as well as the lungs and heart will also be evaluated. Such an approach is necessary to ensure that the target area is adequately covered while the nearby critical  normal structures are adequately spared.  Plan:  The final anticipated total dose therefore will correspond to 50 Gy.   Special treatment procedure was  performed today due to the extra time and effort required by myself to plan and prepare this patient for deep inspiration breath hold technique.  I have determined cardiac sparing to be of benefit to this patient to prevent long term cardiac damage due to radiation of the heart.  Bellows were placed on the patient's abdomen. To facilitate cardiac sparing, the patient was coached by the radiation therapists on breath hold techniques and breathing practice was performed. Practice waveforms were obtained. The patient was then scanned while maintaining breath hold in the treatment position.  This image was then transferred over to the imaging specialist. The imaging specialist then created a fusion of the free breathing and breath hold scans using the chest wall as the stable structure. I personally reviewed the fusion in axial, coronal and sagittal image planes.  Excellent cardiac sparing was obtained.  I felt the patient is an appropriate candidate for breath hold and the patient will be treated as such.  The image fusion was then reviewed with the patient to reinforce the necessity of reproducible breath hold.     _______________________________   Jodelle Gross, MD, PhD

## 2017-05-27 NOTE — Progress Notes (Signed)
  Radiation Oncology         [phone] ________________________________  Name: Monica [name] MRN: [id number]  Date: 05/11/2017  DOB: [birth year]  Optical Surface Tracking Plan:  Since intensity modulated radiotherapy (IMRT) and 3D conformal radiation treatment methods are predicated on accurate and precise positioning for treatment, intrafraction motion monitoring is medically necessary to ensure accurate and safe treatment delivery.  The ability to quantify intrafraction motion without excessive ionizing radiation dose can only be performed with optical surface tracking. Accordingly, surface imaging offers the opportunity to obtain 3D measurements of patient position throughout IMRT and 3D treatments without excessive radiation exposure.  I am ordering optical surface tracking for this patient's upcoming course of radiotherapy. ________________________________  Kyung Rudd, MD 05/27/2017 5:23 PM    Reference:   Particia Jasper, et al. Surface imaging-based analysis of intrafraction motion for breast radiotherapy patients.Journal of [place], n. 6, nov. 2014. ISSN [id number].   Available at: <http://www.jacmp.org/index.php/jacmp/article/view/4957>.

## 2017-05-30 ENCOUNTER — Ambulatory Visit
Admission: RE | Admit: 2017-05-30 | Discharge: 2017-05-30 | Disposition: A | Discharge status: Home / Self Care | Payer: Medicare Other | Source: Ambulatory Visit | Attending: Radiation Oncology | Admitting: Radiation Oncology

## 2017-05-30 DIAGNOSIS — Z510 Encounter for antineoplastic radiation therapy: Secondary | ICD-10-CM | POA: Diagnosis not present

## 2017-05-31 ENCOUNTER — Ambulatory Visit
Admission: RE | Admit: 2017-05-31 | Discharge: 2017-05-31 | Disposition: A | Discharge status: Home / Self Care | Payer: Medicare Other | Source: Ambulatory Visit | Attending: Radiation Oncology | Admitting: Radiation Oncology

## 2017-05-31 DIAGNOSIS — Z510 Encounter for antineoplastic radiation therapy: Secondary | ICD-10-CM | POA: Diagnosis not present

## 2017-06-01 ENCOUNTER — Ambulatory Visit
Admission: RE | Admit: 2017-06-01 | Discharge: 2017-06-01 | Disposition: A | Discharge status: Home / Self Care | Payer: Medicare Other | Source: Ambulatory Visit | Attending: Radiation Oncology | Admitting: Radiation Oncology

## 2017-06-01 DIAGNOSIS — Z510 Encounter for antineoplastic radiation therapy: Secondary | ICD-10-CM | POA: Diagnosis not present

## 2017-06-02 ENCOUNTER — Ambulatory Visit
Admission: RE | Admit: 2017-06-02 | Discharge: 2017-06-02 | Disposition: A | Discharge status: Home / Self Care | Payer: Medicare Other | Source: Ambulatory Visit | Attending: Radiation Oncology | Admitting: Radiation Oncology

## 2017-06-02 DIAGNOSIS — Z510 Encounter for antineoplastic radiation therapy: Secondary | ICD-10-CM | POA: Diagnosis not present

## 2017-06-03 ENCOUNTER — Ambulatory Visit
Admission: RE | Admit: 2017-06-03 | Discharge: 2017-06-03 | Disposition: A | Discharge status: Home / Self Care | Payer: Medicare Other | Source: Ambulatory Visit | Attending: Radiation Oncology | Admitting: Radiation Oncology

## 2017-06-03 DIAGNOSIS — Z510 Encounter for antineoplastic radiation therapy: Secondary | ICD-10-CM | POA: Diagnosis not present

## 2017-06-06 ENCOUNTER — Ambulatory Visit
Admission: RE | Admit: 2017-06-06 | Discharge: 2017-06-06 | Disposition: A | Discharge status: Home / Self Care | Payer: Medicare Other | Source: Ambulatory Visit | Attending: Radiation Oncology | Admitting: Radiation Oncology

## 2017-06-06 DIAGNOSIS — Z510 Encounter for antineoplastic radiation therapy: Secondary | ICD-10-CM | POA: Diagnosis not present

## 2017-06-07 ENCOUNTER — Ambulatory Visit
Admission: RE | Admit: 2017-06-07 | Discharge: 2017-06-07 | Disposition: A | Discharge status: Home / Self Care | Payer: Medicare Other | Source: Ambulatory Visit | Attending: Radiation Oncology | Admitting: Radiation Oncology

## 2017-06-07 DIAGNOSIS — Z510 Encounter for antineoplastic radiation therapy: Secondary | ICD-10-CM | POA: Diagnosis not present

## 2017-06-08 ENCOUNTER — Ambulatory Visit
Admission: RE | Admit: 2017-06-08 | Discharge: 2017-06-08 | Disposition: A | Discharge status: Home / Self Care | Payer: Medicare Other | Source: Ambulatory Visit | Attending: Radiation Oncology | Admitting: Radiation Oncology

## 2017-06-08 DIAGNOSIS — Z510 Encounter for antineoplastic radiation therapy: Secondary | ICD-10-CM | POA: Diagnosis not present

## 2017-06-09 ENCOUNTER — Ambulatory Visit
Admission: RE | Admit: 2017-06-09 | Discharge: 2017-06-09 | Disposition: A | Discharge status: Home / Self Care | Payer: Medicare Other | Source: Ambulatory Visit | Attending: Radiation Oncology | Admitting: Radiation Oncology

## 2017-06-09 DIAGNOSIS — Z510 Encounter for antineoplastic radiation therapy: Secondary | ICD-10-CM | POA: Diagnosis not present

## 2017-06-10 ENCOUNTER — Ambulatory Visit
Admission: RE | Admit: 2017-06-10 | Discharge: 2017-06-10 | Disposition: A | Discharge status: Home / Self Care | Payer: Medicare Other | Source: Ambulatory Visit | Attending: Radiation Oncology | Admitting: Radiation Oncology

## 2017-06-10 DIAGNOSIS — Z510 Encounter for antineoplastic radiation therapy: Secondary | ICD-10-CM | POA: Diagnosis not present

## 2017-06-14 ENCOUNTER — Ambulatory Visit
Admission: RE | Admit: 2017-06-14 | Discharge: 2017-06-14 | Disposition: A | Discharge status: Home / Self Care | Payer: Medicare Other | Source: Ambulatory Visit | Attending: Radiation Oncology | Admitting: Radiation Oncology

## 2017-06-14 ENCOUNTER — Telehealth: Payer: Self-pay | Admitting: *Deleted

## 2017-06-14 DIAGNOSIS — Z510 Encounter for antineoplastic radiation therapy: Secondary | ICD-10-CM | POA: Diagnosis not present

## 2017-06-14 NOTE — Telephone Encounter (Signed)
Rec VM call from pt stating that she was told to call back when she started her last tx which is this week.  Message to Dr Burr Medico.  Noted pt has appt this week with Dr Burr Medico.

## 2017-06-15 ENCOUNTER — Ambulatory Visit
Admission: RE | Admit: 2017-06-15 | Discharge: 2017-06-15 | Disposition: A | Discharge status: Home / Self Care | Payer: Medicare Other | Source: Ambulatory Visit | Attending: Radiation Oncology | Admitting: Radiation Oncology

## 2017-06-15 ENCOUNTER — Telehealth: Payer: Self-pay | Admitting: *Deleted

## 2017-06-15 DIAGNOSIS — Z510 Encounter for antineoplastic radiation therapy: Secondary | ICD-10-CM | POA: Diagnosis not present

## 2017-06-15 NOTE — Progress Notes (Signed)
[name]  [phone] [phone]  Clinic Follow-up Note   Patient Care Team: Jani Gravel, MD as PCP - General (Internal Medicine) [name], MD as Consulting Physician (General Surgery) Truitt Merle, MD as Consulting Physician (Hematology) Kyung Rudd, MD as Consulting Physician (Radiation Oncology) 06/17/2017  CHIEF COMPLAINTS:  Follow up left Breast cancer   Oncology History   Cancer Staging Malignant neoplasm of upper-inner quadrant of left breast in female, estrogen receptor positive ([place]) Staging form: Breast, AJCC 8th Edition - Clinical stage from 02/16/2017: Stage IA (cT1b, cN0, cM0, G2, ER: Positive, PR: Positive, HER2: Negative) - Unsigned Staging comments: Staged at breast conference  - Pathologic stage from 03/16/2017: Stage IA (pT1b, pN0, cM0, G2, ER: Positive, PR: Positive, HER2: Negative, Oncotype DX score: 27) - Signed by Truitt Merle, MD on 04/24/2017       Malignant neoplasm of upper-inner quadrant of left breast in female, estrogen receptor positive ([place])   02/09/2017 Initial Diagnosis    Malignant neoplasm of upper-inner quadrant of left breast in female, estrogen receptor positive ([place])      02/09/2017 Initial Biopsy    Diagnosis Breast, left, needle core biopsy - INVASIVE DUCTAL CARCINOMA, 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 Dalbert Batman       03/16/2017 Pathology Results    PATHOLOGY REPORT Diagnosis 03/16/17 1. Breast, lumpectomy, Left - INVASIVE DUCTAL CARCINOMA, NOTTINGHAM GRADE 2 OF 3, 0.9 CM - DUCTAL CARCINOMA IN SITU - MARGINS UNINVOLVED BY CARCINOMA (0.3 CM POSTERIOR  MARGIN) - PREVIOUS BIOPSY SITE CHANGES - SEE ONCOLOGY TABLE BELOW 2. Lymph node, sentinel, biopsy, Left axillary #1 - NO CARCINOMA IDENTIFIED IN ONE LYMPH NODE (0/1) 3. Lymph node, sentinel, biopsy, Left axillary #2 - NO CARCINOMA IDENTIFIED IN ONE LYMPH NODE (0/1) 4. Lymph node, sentinel, biopsy, Left axillary #3 - NO CARCINOMA IDENTIFIED IN ONE LYMPH NODE (0/1) 5. Lymph node, sentinel, biopsy, Left axillary #4 - NO CARCINOMA IDENTIFIED IN ONE LYMPH NODE (0/1)       03/16/2017 Oncotype testing    Recurrance score of 27 with a 18% distance recurrance in the net 10 years with Tamoxifen alone       05/19/2017 - 06/16/2017 Radiation Therapy    Radiation with Dr. Lisbeth Renshaw        HISTORY OF PRESENTING ILLNESS: 02/16/17 Monica [name] 71 y.o. female is here because of newly diagnosed left breast cancer. She is accompanied by her multiple family members to our disciplinary breast clinic today.  This was found by a mammogram screening of the left breast. She had a abnormal benign mammogram 35 years ago and had surgery to remove the mass form the left breast. She denies feeling a lump herself and denies any change lately. She reports having a fair appetite and no weight loss. She ranges 124/127 pounds. She denies and pain and aches but has pain in her left knee joint but tolerable as well in her hip. She takes tylenol for pain. She has a history of DM and follows up with Dr. Maudie Mercury. Her sugar has been running high lightly. Had a CHF and MI in 2003 and has a stent.She denies chest  pain and her last echo was in 2016 and is normal. She has not had a stroke. No history of breast cancer in family. She still smokes.   Her most exercise is walking her dog and she remain moving around the house. She will see her PCP next week   GYN HISTORY  Menarchal: 11 LMP: many years ago, not sure  Contraceptive: none  HRT: none  G3P3: She was 25 at first live births.   CURRENT THERAPY: anastrozole 1 mg once a day to  start in 3 weeks.    INTERVAL HISTORY:  Monica [name] is here for a follow up. She presents to the clinic today reporting she has some pain form radiation but no skin burn. She denies fatigue.  She reports to having a diabetic seizure from low blood sugar. She keeps snacks with her. She uses insulin as her DM medication.   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 She takes tylenol only if she really needs it.    MEDICAL HISTORY:  Past Medical History:  Diagnosis Date   CAD (coronary artery disease)    Cypher stent 09/2002   CHF (congestive heart failure) (HCC)    Diabetes mellitus (HCC)    Hyperlipidemia    Hypertension    Myocardial infarction ([place])    2003,went into cardiac shock   Osteoporosis    Sinusitis     SURGICAL HISTORY: Past Surgical History:  Procedure Laterality Date   ABDOMINAL HYSTERECTOMY     APPENDECTOMY     BREAST LUMPECTOMY WITH RADIOACTIVE SEED AND SENTINEL LYMPH NODE BIOPSY Left 03/16/2017   Procedure: INJECT BLUE DYE LEFT BREAST, LEFT BREAST LUMPECTOMY WITH RADIOACTIVE SEED AND LEFT AXILLARY DEEP SENTINEL LYMPH NODE  BIOPSY ADJACENT TISSUE TRANSFER;  Surgeon: [name], MD;  Location: [facility];  Service: General;  Laterality: Left;   CARPAL TUNNEL RELEASE Bilateral    COLONOSCOPY WITH PROPOFOL N/A 05/30/2015   Procedure: COLONOSCOPY WITH PROPOFOL;  Surgeon: Carol Ada, MD;  Location: [facility];  Service: Endoscopy;  Laterality: N/A;   CORONARY ANGIOPLASTY WITH STENT PLACEMENT  10-02-2002   Exploratory abdominal     Bleeding in abdomen after tubal   TUBAL LIGATION     UMBILICAL HERNIA REPAIR      SOCIAL HISTORY: Social History   Social History   Marital status: Married    Spouse name: N/A   Number of children: 3   Years of education: N/A   Occupational History   Not on file.   Social History Main Topics   Smoking status: Former Smoker    Packs/day: 2.00    Years: 30.00    Types: Cigarettes    Quit date:  03/09/2017   Smokeless tobacco: Current User     Comment: e cig   Alcohol use No   Drug use: No   Sexual activity: Not on file   Other Topics Concern   Not on file   Social History Narrative   Lives with husband.     FAMILY HISTORY: Family History  Problem Relation Age of Onset   Dementia Mother    Kidney disease Mother    Heart attack Father 76       Died age 87   Heart attack Brother 13   Heart attack Brother 45    ALLERGIES:  is allergic to ace inhibitors; keflex [cephalexin]; phenobarbital; sulfate; and altace [ramipril].  MEDICATIONS:  Current Outpatient Prescriptions  Medication Sig Dispense Refill   acetaminophen (TYLENOL) 500 MG tablet  Take 1,000 mg by mouth every 6 (six) hours as needed for moderate pain or headache.     aspirin EC 81 MG tablet Take 81 mg by mouth at bedtime.     atorvastatin (LIPITOR) 40 MG tablet Take 40 mg by mouth at bedtime.     Calcium Carbonate-Vitamin D 600-400 MG-UNIT per tablet Take 1 tablet by mouth 2 (two) times daily.     carvedilol (COREG) 3.125 MG tablet Take 3.125 mg by mouth 2 (two) times daily.      clonazePAM (KLONOPIN) 1 MG tablet Take 1 mg by mouth at bedtime.      glimepiride (AMARYL) 4 MG tablet Take 4 mg by mouth 2 (two) times daily.     halobetasol (ULTRAVATE) 0.05 % cream Apply 1 application topically 2 (two) times daily as needed (rash on leg). Applied to rash on leg (ankle area)     Insulin Detemir (LEVEMIR) 100 UNIT/ML Pen Inject 17-25 Units into the skin 2 (two) times daily. 25 units in the morning & 17 units at night     JANUVIA 100 MG tablet Take 100 mg by mouth at bedtime.      losartan (COZAAR) 50 MG tablet Take 50 mg by mouth at bedtime.      metFORMIN (GLUCOPHAGE-XR) 500 MG 24 hr tablet Take 500 mg by mouth 2 (two) times daily.     Multiple Vitamin (MULTIVITAMIN WITH MINERALS) TABS tablet Take 1 tablet by mouth at bedtime.     nitroGLYCERIN (NITROSTAT) 0.4 MG SL tablet Place 1 tablet (0.4  mg total) under the tongue every 5 (five) minutes as needed for chest pain. 25 tablet 1   non-metallic deodorant (ALRA) MISC Apply 1 application topically daily as needed.     Omega-3 Fatty Acids (FISH OIL) 1200 MG CAPS Take 2,400 mg by mouth 2 (two) times daily.     omeprazole (PRILOSEC) 20 MG capsule Take 20 mg by mouth at bedtime.      ONETOUCH DELICA LANCETS 67M MISC 1 each by Other route 3 (three) times daily.      ONETOUCH VERIO test strip 1 each by Other route 3 (three) times daily.      pioglitazone (ACTOS) 30 MG tablet Take 30 mg by mouth at bedtime.     Polyethyl Glycol-Propyl Glycol (SYSTANE) 0.4-0.3 % SOLN Place 1 drop into both eyes 2 (two) times daily as needed (dry eyes).      risedronate (ACTONEL) 150 MG tablet Take 150 mg by mouth every 30 (thirty) days.     sertraline (ZOLOFT) 100 MG tablet Take 100 mg by mouth at bedtime.     No current facility-administered medications for this visit.     REVIEW OF SYSTEMS:   Constitutional: Denies fevers, chills or abnormal night sweats (+) fair appetite Eyes: Denies blurriness of vision, double vision or watery eyes Ears, nose, mouth, throat, and face: Denies mucositis or sore throat Respiratory: Denies cough, dyspnea or wheezes Cardiovascular: Denies palpitation, chest discomfort or lower extremity swelling Gastrointestinal:  Denies nausea, heartburn or change in bowel habits MSK: (+) occasional knee joint and finger pain Skin: Denies abnormal skin rashes Lymphatics: Denies new lymphadenopathy or easy bruising Neurological:Denies numbness, tingling or new weaknesses Behavioral/Psych: Mood is stable, no new changes  All other systems were reviewed with the patient and are negative. Breast: (+) pain in left axilla and tenderness around left breast due to surgery   PHYSICAL EXAMINATION: ECOG PERFORMANCE STATUS: 0 - Asymptomatic  Vitals:   06/17/17 1257  BP: (!) 145/78  Pulse: 72  Temp: 97.7 F (36.5 C)  SpO2: 96%    Filed Weights   06/17/17 1257  Weight: 124 lb 12.8 oz (56.6 kg)    GENERAL:alert, no distress and comfortable SKIN: skin color, texture, turgor are normal, no rashes or significant lesions EYES: normal, conjunctiva are pink and non-injected, sclera clear OROPHARYNX:no exudate, no erythema and lips, buccal mucosa, and tongue normal  NECK: supple, thyroid normal size, non-tender, without nodularity LYMPH:  no palpable lymphadenopathy in the cervical, axillary or inguinal LUNGS: clear to auscultation and percussion with normal breathing effort HEART: regular rate & rhythm and no murmurs and no lower extremity edema ABDOMEN: abdomen soft, non-tender and normal bowel sounds Musculoskeletal:no cyanosis of digits and no clubbing  PSYCH: alert & oriented x 3 with fluent speech NEURO: no focal motor/sensory deficits Breasts: Breast inspection showed them to be symmetrical with no nipple discharge. Palpation of the breasts and axilla revealed no obvious mass that I could appreciate. (+) left UOQ and left axilla incisions, healed well. No palpable mass.(+) Diffuse mild erythyma of the left breast  LABORATORY DATA:  I have reviewed the data as listed CBC Latest Ref Rng & Units 05/24/2017 03/09/2017 07/09/2016  WBC 4.0 - 10.5 K/uL 7.5 6.7 6.3  Hemoglobin 12.0 - 15.0 g/dL 15.2(H) 14.8 14.5  Hematocrit 36.0 - 46.0 % 43.5 45.3 42.9  Platelets 150 - 400 K/uL 115(L) 121(L) 108(L)    CMP Latest Ref Rng & Units 05/24/2017 03/09/2017 07/09/2016  Glucose 65 - 99 mg/dL 846(N) 629(B) 284(X)  BUN 6 - 20 mg/dL 11 <3(K) 12  Creatinine 0.44 - 1.00 mg/dL 4.40 1.02 7.25  Sodium 135 - 145 mmol/L 140 135 136  Potassium 3.5 - 5.1 mmol/L 3.9 4.0 3.7  Chloride 101 - 111 mmol/L 106 100(L) 103  CO2 22 - 32 mmol/L 26 25 21(L)  Calcium 8.9 - 10.3 mg/dL 9.6 36.6 9.7  Total Protein 6.5 - 8.1 g/dL 7.2 - 6.8  Total Bilirubin 0.3 - 1.2 mg/dL 0.7 - 0.6  Alkaline Phos 38 - 126 U/L 40 - 50  AST 15 - 41 U/L 33 - 40  ALT 14 -  54 U/L 30 - 39    PATHOLOGY REPORT Diagnosis 03/16/17 1. Breast, lumpectomy, Left - INVASIVE DUCTAL CARCINOMA, NOTTINGHAM GRADE 2 OF 3, 0.9 CM - DUCTAL CARCINOMA IN SITU - MARGINS UNINVOLVED BY CARCINOMA (0.3 CM POSTERIOR MARGIN) - PREVIOUS BIOPSY SITE CHANGES - SEE ONCOLOGY TABLE BELOW 2. Lymph node, sentinel, biopsy, Left axillary #1 - NO CARCINOMA IDENTIFIED IN ONE LYMPH NODE (0/1) 3. Lymph node, sentinel, biopsy, Left axillary #2 - NO CARCINOMA IDENTIFIED IN ONE LYMPH NODE (0/1) 4. Lymph node, sentinel, biopsy, Left axillary #3 - NO CARCINOMA IDENTIFIED IN ONE LYMPH NODE (0/1) 5. Lymph node, sentinel, biopsy, Left axillary #4 - NO CARCINOMA IDENTIFIED IN ONE LYMPH NODE (0/1) Microscopic Comment 1. BREAST, INVASIVE TUMOR Procedure: Excision Laterality: Left Tumor Size: 0.9 x 0.8 x 0.7 cm Histologic Type: Invasive carcinoma of no special type (ductal, not otherwise specified) Grade: Nottingham Grade 2 Tubular Differentiation: 2 Nuclear Pleomorphism: 2 Mitotic Count: 2 Ductal Carcinoma in Situ (DCIS): Present (solid, cribriform patterns) Margins: Uninvolved by carcinoma Invasive carcinoma, distance from closest margin: 0.3 cm (posterior) DCIS, distance from closest margin: 0.4 cm (superior) Regional Lymph Nodes: Number of Lymph Nodes Examined: 4 Microscopic Comment(continued) Number of Sentinel Lymph Nodes Examined: 4 Lymph Nodes with Macrometastases: 0 Lymph Nodes with Micrometastases: 0 Lymph Nodes with Isolated Tumor Cells:  0 Breast Prognostic Profile: See [id number] (02/10/2017) Estrogen Receptor: Positive (100%, Strong) Progesterone Receptor: Positive (15%, Strong) Her2: Negative Ki-67: 15% Best tumor block for sendout testing: 1B Treatment Effect: No known presurgical therapy Pathologic Stage Classification (pTNM, AJCC 8th Edition): Primary Tumor: pT1b Regional Lymph Nodes: pN0 DAWN  Diagnosis 02/09/17 Breast, left, needle core biopsy - INVASIVE DUCTAL  CARCINOMA. - DUCTAL CARCINOMA IN SITU. - SEE COMMENT. Microscopic Comment The carcinoma appears grade I - II. A breast prognostic profile will be performed and the results reported separately. The results were called to [facility] on 02/10/2017. Results: IMMUNOHISTOCHEMICAL AND MORPHOMETRIC ANALYSIS PERFORMED MANUALLY Estrogen Receptor: 100%, POSITIVE, STRONG STAINING INTENSITY Progesterone Receptor: 15%, POSITIVE, STRONG STAINING INTENSITY Proliferation Marker Ki67: 15% REFERENCE RANGE ESTROGEN RECEPTOR NEGATIVE 0% POSITIVE =>1% REFERENCE RANGE PROGESTERONE RECEPTOR NEGATIVE 0% POSITIVE =>1% All controls stained appropriately Results: HER2 - NEGATIVE RATIO OF HER2/CEP17 SIGNALS 1.24 AVERAGE HER2 COPY NUMBER PER CELL 1.80 Reference Range: NEGATIVE HER2/CEP17 Ratio <2.0 and average HER2 copy number <4.0 EQUIVOCAL HER2/CEP17 Ratio <2.0 and average HER2 copy number >=4.0 and <6.0   ONCOTYPE 03/16/17    RADIOGRAPHIC STUDIES: I have personally reviewed the radiological images as listed and agreed with the findings in the report. Ct Head Wo Contrast  Result Date: 05/24/2017 CLINICAL DATA:  71 year old hypertensive diabetic female with seizure. Did not hit head wall fall. Glucose 52 upon arrival. Breast cancer post chemotherapy and radiation therapy today. Initial encounter. EXAM: CT HEAD WITHOUT CONTRAST TECHNIQUE: Contiguous axial images were obtained from the base of the skull through the vertex without intravenous contrast. COMPARISON:  None. FINDINGS: Brain: No intracranial hemorrhage or CT evidence of large acute infarct. No hydrocephalus. No intracranial mass lesion noted on this unenhanced exam. Vascular: Vascular calcifications. Skull: No skull fracture or destructive lesion. Sinuses/Orbits: No acute orbital abnormality. Visualized paranasal sinuses are clear. Other: Mastoid air cells and middle ear cavities are clear. IMPRESSION: No acute intracranial abnormality noted on  this unenhanced exam. Electronically Signed   By: Genia Del M.D.   On: 05/24/2017 17:24    ASSESSMENT & PLAN:  Monica [name] is 71 y.o. caucasian female who has a history of an MI, CHF and DM. She is here for her newly diagnosed Breast Cancer  1. 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 -the Oncotype Dx result was reviewed with her in details. She has intermedia risk based on the recurrence score 27, which predicts 10 year distant recurrence after 5 years of tamoxifen 18%. The benefit of chemotherapy in the intermedia risk group is small and controversial. Given her advanced age, comorbidities, strong ER and PR expression in her tumor, small benefit of chemotherapy, I did not recommend adjuvant chemotherapy. She and her family agrees with the plan -She completed radiation on 06/17/17 and tolerated very well with only mild reaction.  --Given the strong ER and PR positivity, I do recommend adjuvant aromatase inhibitor to reduce her risk of cancer recurrence,  The potential benefit and side effects, which includes but not limited to, hot flash, skin and vaginal dryness, metabolic changes ( increased blood glucose, cholesterol, weight, etc.), slightly in increased risk of cardiovascular disease, cataracts, muscular and joint discomfort, osteopenia and osteoporosis, etc, were discussed with her in great details. She is interested -I called in anastrozole for her  -She will start in  3 weeks -f/u in 08/2017      2. DM, poorly controlled -uncontrolled, she is somewhat oral medication and insulin -Follows up with Dr. Maudie Mercury her PCP -Had diabetic seizure from low blood sugar on 05/24/17.  -I discussed to the possible drug interaction with anastrozole and her DM medication.   3. Mild thrombocytopenia -She has  long-standing history of mild pancytopenia, related around 100, no history of significant bleeding.  -No history of liver disease or alcohol abuse. 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 -We'll continue monitoring.  4. Osteoporosis -Continue calcium and vitamin D -Will repeat Bone density in 07/2017  PLAN: -Order Anastrozole 1 mg once daily to start in 3 weeks -DEXA in one month at [facility] and f/u in 2.5 months    No orders of the defined types were placed in this encounter.   All questions were answered. The patient knows to call the clinic with any problems, questions or concerns. I spent 25 minutes counseling the patient face to face. The total time spent in the appointment was 30 minutes and more than 50% was on counseling.  This document serves as a record of services personally performed by Truitt Merle, MD. It was created on her behalf by Joslyn Devon, a trained medical scribe. The creation of this record is based on the scribe's personal observations and the provider's statements to them. This document has been checked and approved by the attending provider.     Truitt Merle, MD 06/17/2017 1:24 PM

## 2017-06-15 NOTE — Telephone Encounter (Signed)
Pt  notified via voicemail that her message was rec and that Dr Burr Medico was informed of tx completion this week. She was reminded of upcoming appt.

## 2017-06-16 ENCOUNTER — Ambulatory Visit
Admission: RE | Admit: 2017-06-16 | Discharge: 2017-06-16 | Disposition: A | Discharge status: Home / Self Care | Payer: Medicare Other | Source: Ambulatory Visit | Attending: Radiation Oncology | Admitting: Radiation Oncology

## 2017-06-16 ENCOUNTER — Encounter: Payer: Self-pay | Admitting: Radiation Oncology

## 2017-06-16 ENCOUNTER — Telehealth: Payer: Self-pay | Admitting: Adult Health

## 2017-06-16 DIAGNOSIS — Z170 Estrogen receptor positive status [ER+]: Principal | ICD-10-CM

## 2017-06-16 DIAGNOSIS — C50212 Malignant neoplasm of upper-inner quadrant of left female breast: Secondary | ICD-10-CM

## 2017-06-16 DIAGNOSIS — Z510 Encounter for antineoplastic radiation therapy: Secondary | ICD-10-CM | POA: Diagnosis not present

## 2017-06-16 MED ORDER — RADIAPLEXRX EX GEL
Freq: Once | CUTANEOUS | Status: AC
  Administered 2017-06-16: 15:00:00 via TOPICAL

## 2017-06-16 NOTE — Telephone Encounter (Signed)
Left voicemail for patient regarding the addition of her appointments in December. Sending her a confirmation letter.

## 2017-06-17 ENCOUNTER — Ambulatory Visit (HOSPITAL_BASED_OUTPATIENT_CLINIC_OR_DEPARTMENT_OTHER): Payer: Medicare Other | Admitting: Hematology

## 2017-06-17 ENCOUNTER — Telehealth: Payer: Self-pay | Admitting: Hematology

## 2017-06-17 VITALS — BP 145/78 | HR 72 | Temp 97.7°F | Ht 65.0 in | Wt 124.8 lb

## 2017-06-17 DIAGNOSIS — M818 Other osteoporosis without current pathological fracture: Secondary | ICD-10-CM | POA: Diagnosis not present

## 2017-06-17 DIAGNOSIS — Z170 Estrogen receptor positive status [ER+]: Secondary | ICD-10-CM

## 2017-06-17 DIAGNOSIS — Z794 Long term (current) use of insulin: Secondary | ICD-10-CM

## 2017-06-17 DIAGNOSIS — E2839 Other primary ovarian failure: Secondary | ICD-10-CM

## 2017-06-17 DIAGNOSIS — E118 Type 2 diabetes mellitus with unspecified complications: Secondary | ICD-10-CM

## 2017-06-17 DIAGNOSIS — I2510 Atherosclerotic heart disease of native coronary artery without angina pectoris: Secondary | ICD-10-CM

## 2017-06-17 DIAGNOSIS — C50212 Malignant neoplasm of upper-inner quadrant of left female breast: Secondary | ICD-10-CM

## 2017-06-17 MED ORDER — ANASTROZOLE 1 MG PO TABS: 1.0000 mg | ORAL_TABLET | Freq: Every day | ORAL | 3 refills | Status: DC

## 2017-06-17 NOTE — Telephone Encounter (Signed)
Gave patient AVS and calendar of upcoming November appointments.  °

## 2017-06-18 ENCOUNTER — Encounter: Payer: Self-pay | Admitting: Hematology

## 2017-07-06 NOTE — Progress Notes (Signed)
  Radiation Oncology         [phone] ________________________________  Name: Monica [name] MRN: [id number]  Date: 06/16/2017  DOB: 23-Jun-1946  End of Treatment Note  Diagnosis:   Stage IA, pT1bN0,Mx grade 2, ER/PR positive invasive ductal carcinoma with DCIS of the left breast.       ICD-10-CM   1. Malignant neoplasm of upper-inner quadrant of left breast in female, estrogen receptor positive ([place]) C50.212    Z17.0     Indication for treatment:  Curative      Radiation treatment dates:   05/19/2017 to 06/16/2017  Site/dose:    1. The Left breast was treated to 42.5 Gy in 17 fractions at 2.5 Gy per fraction.  2. The Left breast was boosted to 7.5 Gy in 3 fractions at 2.5 Gy per fraction.   Beams/energy:    1. 3D // 10X, 6X 2. Electron boost // Grier Mitts, 9E  Narrative: The patient tolerated radiation treatment relatively well.   She developed mild erythema and hyperpigmentation within the treatment field. She reports discomfort and irritation to her left breast. Using radiaplex twice daily as directed. She reports mild fatigue.  Plan: The patient has completed radiation treatment. The patient will return to radiation oncology clinic for routine followup in one month. I advised them to call or return sooner if they have any questions or concerns related to their recovery or treatment.  ------------------------------------------------  Jodelle Gross, MD, PhD  This document serves as a record of services personally performed by Kyung Rudd, MD. It was created on his behalf by Arlyce Harman, a trained medical scribe. The creation of this record is based on the scribe's personal observations and the provider's statements to them. This document has been checked and approved by the attending provider.

## 2017-08-01 ENCOUNTER — Ambulatory Visit
Admission: RE | Admit: 2017-08-01 | Discharge: 2017-08-01 | Disposition: A | Discharge status: Home / Self Care | Payer: Medicare Other | Source: Ambulatory Visit | Attending: Radiation Oncology | Admitting: Radiation Oncology

## 2017-08-01 ENCOUNTER — Encounter: Payer: Self-pay | Admitting: Radiation Oncology

## 2017-08-01 VITALS — BP 163/74 | HR 98 | Temp 98.0°F | Resp 18 | Ht 65.0 in | Wt 126.8 lb

## 2017-08-01 DIAGNOSIS — R569 Unspecified convulsions: Secondary | ICD-10-CM | POA: Diagnosis not present

## 2017-08-01 DIAGNOSIS — Z170 Estrogen receptor positive status [ER+]: Secondary | ICD-10-CM | POA: Insufficient documentation

## 2017-08-01 DIAGNOSIS — C50212 Malignant neoplasm of upper-inner quadrant of left female breast: Secondary | ICD-10-CM | POA: Insufficient documentation

## 2017-08-01 DIAGNOSIS — Z794 Long term (current) use of insulin: Secondary | ICD-10-CM | POA: Insufficient documentation

## 2017-08-01 DIAGNOSIS — Z79899 Other long term (current) drug therapy: Secondary | ICD-10-CM | POA: Insufficient documentation

## 2017-08-01 DIAGNOSIS — Z7982 Long term (current) use of aspirin: Secondary | ICD-10-CM | POA: Insufficient documentation

## 2017-08-01 NOTE — Progress Notes (Signed)
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   Interval Since Last Radiation: 7 weeks    05/19/2017 to 06/16/2017:   1. The Left breast was treated to 42.5 Gy in 17 fractions at 2.5 Gy per fraction.  2. The Left breast was boosted to 7.5 Gy in 3 fractions at 2.5 Gy per fraction.   Narrative:  The patient returns today for routine follow-up. During treatment she did very well with radiotherapy and did not have significant desquamation.                             On review of systems, the patient states she's doing well overall. She is having some trouble with seizure activity since August and is seeing neurology tomorrow. She had a negative head CT scan in August. She denies any concerns with her breast.  ALLERGIES:  is allergic to ace inhibitors; keflex [cephalexin]; phenobarbital; sulfate; and altace [ramipril].  Meds: Current Outpatient Prescriptions  Medication Sig Dispense Refill  . acetaminophen (TYLENOL) 500 MG tablet Take 1,000 mg by mouth every 6 (six) hours as needed for moderate pain or headache.    . anastrozole (ARIMIDEX) 1 MG tablet Take 1 tablet (1 mg total) by mouth daily. 30 tablet 3  . aspirin EC 81 MG tablet Take 81 mg by mouth at bedtime.    Marland Kitchen atorvastatin (LIPITOR) 40 MG tablet Take 40 mg by mouth at bedtime.    . Calcium Carbonate-Vitamin D 600-400 MG-UNIT per tablet Take 1 tablet by mouth 2 (two) times daily.    . carvedilol (COREG) 3.125 MG tablet Take 3.125 mg by mouth 2 (two) times daily.     . clonazePAM (KLONOPIN) 1 MG tablet Take 1 mg by mouth at bedtime.     Marland Kitchen glimepiride (AMARYL) 4 MG tablet Take 4 mg by mouth 2 (two) times daily.    . Insulin Detemir (LEVEMIR) 100  UNIT/ML Pen Inject 17-25 Units into the skin 2 (two) times daily. 25 units in the morning & 17 units at night    . JANUVIA 100 MG tablet Take 100 mg by mouth at bedtime.     Marland Kitchen losartan (COZAAR) 50 MG tablet Take 50 mg by mouth at bedtime.     . metFORMIN (GLUCOPHAGE-XR) 500 MG 24 hr tablet Take 500 mg by mouth 2 (two) times daily.    . Multiple Vitamin (MULTIVITAMIN WITH MINERALS) TABS tablet Take 1 tablet by mouth at bedtime.    . Omega-3 Fatty Acids (FISH OIL) 1200 MG CAPS Take 2,400 mg by mouth 2 (two) times daily.    Marland Kitchen omeprazole (PRILOSEC) 20 MG capsule Take 20 mg by mouth at bedtime.     Glory Rosebush DELICA LANCETS 98P MISC 1 each by Other route 3 (three) times daily.     Glory Rosebush VERIO test strip 1 each by Other route 3 (three) times daily.     . pioglitazone (ACTOS) 30 MG tablet Take 30 mg by mouth at bedtime.    Vladimir Faster Glycol-Propyl Glycol (SYSTANE) 0.4-0.3 % SOLN Place 1 drop into both eyes 2 (two) times daily as needed (dry eyes).     Marland Kitchen  risedronate (ACTONEL) 150 MG tablet Take 150 mg by mouth every 30 (thirty) days.    Marland Kitchen sertraline (ZOLOFT) 100 MG tablet Take 100 mg by mouth at bedtime.    . halobetasol (ULTRAVATE) 0.05 % cream Apply 1 application topically 2 (two) times daily as needed (rash on leg). Applied to rash on leg (ankle area)    . nitroGLYCERIN (NITROSTAT) 0.4 MG SL tablet Place 1 tablet (0.4 mg total) under the tongue every 5 (five) minutes as needed for chest pain. 25 tablet 1   No current facility-administered medications for this encounter.     Physical Findings:  height is 5\' 5"  (1.651 m) and weight is 126 lb 12.8 oz (57.5 kg). Her oral temperature is 98 F (36.7 C). Her blood pressure is 163/74 (abnormal) and her pulse is 98. Her respiration is 18 and oxygen saturation is 99%.  Pain Assessment Pain Score: 0-No pain/10 In general this is a well appearing caucasian female in no acute distress. She's alert and oriented x4 and appropriate throughout the  examination. Cardiopulmonary assessment is negative for acute distress and she exhibits normal effort. The left breast was examined and reveals no evidence of desquamation or hyperpigmentation.   Lab Findings: Lab Results  Component Value Date   WBC 7.5 05/24/2017   HGB 15.2 (H) 05/24/2017   HCT 43.5 05/24/2017   MCV 76.2 (L) 05/24/2017   PLT 115 (L) 05/24/2017     Radiographic Findings: No results found.  Impression/Plan: 1. 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 The patient has been doing well since completion of radiotherapy. We discussed that we would be happy to continue to follow her as needed, but she will also continue to follow up with Dr. Burr Medico in medical oncology. She was counseled on skin care as well as measures to avoid sun exposure to this area.  2. Survivorship. The patient is counseled on the role of survivorship and is given information regarding resources here at the cancer center. 3. Seizure activity. She will see neurology tomorrow and will likely have an MRI. I think it would be very unlikely that this is related to her breast disease.     Carola Rhine, PAC

## 2017-08-02 ENCOUNTER — Ambulatory Visit (INDEPENDENT_AMBULATORY_CARE_PROVIDER_SITE_OTHER): Payer: Medicare Other | Admitting: Diagnostic Neuroimaging

## 2017-08-02 ENCOUNTER — Encounter: Payer: Self-pay | Admitting: *Deleted

## 2017-08-02 VITALS — BP 150/79 | HR 64 | Ht 65.0 in | Wt 125.6 lb

## 2017-08-02 DIAGNOSIS — R569 Unspecified convulsions: Secondary | ICD-10-CM

## 2017-08-02 DIAGNOSIS — Z794 Long term (current) use of insulin: Secondary | ICD-10-CM

## 2017-08-02 DIAGNOSIS — C50212 Malignant neoplasm of upper-inner quadrant of left female breast: Secondary | ICD-10-CM

## 2017-08-02 DIAGNOSIS — Z170 Estrogen receptor positive status [ER+]: Secondary | ICD-10-CM

## 2017-08-02 DIAGNOSIS — E118 Type 2 diabetes mellitus with unspecified complications: Secondary | ICD-10-CM

## 2017-08-02 MED ORDER — LEVETIRACETAM 500 MG PO TABS: 500.0000 mg | ORAL_TABLET | Freq: Two times a day (BID) | ORAL | 12 refills | Status: DC

## 2017-08-02 NOTE — Patient Instructions (Signed)
-   check MRI brain, EEG  - start levetiracetam 500mg  twice a day   - According to [place] law, you can not drive unless you are seizure / syncope free for at least 6 months and under physician's care.   - Please maintain precautions. Do not participate in activities where a loss of awareness could harm you or someone else. No swimming alone, no tub bathing, no hot tubs, no driving, no operating motorized vehicles (cars, ATVs, motocycles, etc), lawnmowers, power tools or firearms. No standing at heights, such as rooftops, ladders or stairs. Avoid hot objects such as stoves, heaters, open fires. Wear a helmet when riding a bicycle, scooter, skateboard, etc. and avoid areas of traffic. Set your water heater to 120 degrees or less.

## 2017-08-02 NOTE — Progress Notes (Signed)
[facility]  PATIENT: Monica [name] DOB: 01-Sep-1946  REFERRING CLINICIAN: Georges Mouse, MD HISTORY FROM: patient and husband and chart review REASON FOR VISIT: new consult    HISTORICAL  CHIEF COMPLAINT:  Chief Complaint  Patient presents with  . Seizures    rm 7, New Pt, husb- Sonia Side, "1st seizure in late 30's, put on Depakote 1500 mg daily, took for long time; recently had 2 seizures"     HISTORY OF PRESENT ILLNESS:   71 year old female here for evaluation of seizures.  History of diabetes, heart disease, breast cancer.  The  At age late 71 years old patient had a seizure, lips turn blue, no tongue biting or incontinence.  She was apparently treated with antiseizure medication for several years and then discontinued.    May 24, 2017 patient was at [facility], when all of a sudden she collapsed and had a seizure.  Blood glucose was checked within 15 minutes and was 52.  No warning symptoms.  She ate breakfast that morning but no lunch.  She was taken to the hospital and evaluated.  July 28, 2017 patient was at home, when all of a sudden she collapsed.  She was found stiff all over, with some convulsions.  Family gave her some juice to drink and blood sugar after testing was 120.  She was confused afterwards.  No tongue biting or incontinence.  Patient has diagnosis of breast cancer.  Her first radiation treatment was May 11, 2017.    REVIEW OF SYSTEMS: Full 14 system review of systems performed and negative with exception of: Ringing in ears lymph node enlargement seizure depression anxiety decreased energy runny nose.  ALLERGIES: Allergies  Allergen Reactions  . Ace Inhibitors Dermatitis and Cough  . Keflex [Cephalexin] Itching  . Phenobarbital Other (See Comments)    Black spots on hands and feet.  . Sulfate Nausea And Vomiting and Other (See Comments)    Dizziness   . Altace [Ramipril] Other (See Comments)    UNSPECIFIED REACTION       HOME MEDICATIONS: Outpatient Medications Prior to Visit  Medication Sig Dispense Refill  . acetaminophen (TYLENOL) 500 MG tablet Take 1,000 mg by mouth every 6 (six) hours as needed for moderate pain or headache.    . anastrozole (ARIMIDEX) 1 MG tablet Take 1 tablet (1 mg total) by mouth daily. 30 tablet 3  . aspirin EC 81 MG tablet Take 81 mg by mouth at bedtime.    Marland Kitchen atorvastatin (LIPITOR) 40 MG tablet Take 40 mg by mouth at bedtime.    . Calcium Carbonate-Vitamin D 600-400 MG-UNIT per tablet Take 1 tablet by mouth 2 (two) times daily.    . carvedilol (COREG) 3.125 MG tablet Take 3.125 mg by mouth 2 (two) times daily.     . clonazePAM (KLONOPIN) 1 MG tablet Take 1 mg by mouth at bedtime.     Marland Kitchen glimepiride (AMARYL) 4 MG tablet Take 4 mg by mouth 2 (two) times daily.    . halobetasol (ULTRAVATE) 0.05 % cream Apply 1 application topically 2 (two) times daily as needed (rash on leg). Applied to rash on leg (ankle area)    . Insulin Detemir (LEVEMIR) 100 UNIT/ML Pen Inject 17-25 Units into the skin 2 (two) times daily. 25 units in the morning & 17 units at night    . JANUVIA 100 MG tablet Take 100 mg by mouth at bedtime.     Marland Kitchen losartan (COZAAR) 50 MG tablet Take 50  mg by mouth at bedtime.     . metFORMIN (GLUCOPHAGE-XR) 500 MG 24 hr tablet Take 500 mg by mouth 2 (two) times daily.    . Multiple Vitamin (MULTIVITAMIN WITH MINERALS) TABS tablet Take 1 tablet by mouth at bedtime.    . Omega-3 Fatty Acids (FISH OIL) 1200 MG CAPS Take 2,400 mg by mouth 2 (two) times daily.    Marland Kitchen omeprazole (PRILOSEC) 20 MG capsule Take 20 mg by mouth at bedtime.     Glory Rosebush DELICA LANCETS 60Y MISC 1 each by Other route 3 (three) times daily.     Glory Rosebush VERIO test strip 1 each by Other route 3 (three) times daily.     . pioglitazone (ACTOS) 30 MG tablet Take 30 mg by mouth at bedtime.    Vladimir Faster Glycol-Propyl Glycol (SYSTANE) 0.4-0.3 % SOLN Place 1 drop into both eyes 2 (two) times daily as needed (dry  eyes).     . risedronate (ACTONEL) 150 MG tablet Take 150 mg by mouth every 30 (thirty) days.    Marland Kitchen sertraline (ZOLOFT) 100 MG tablet Take 100 mg by mouth at bedtime.    . nitroGLYCERIN (NITROSTAT) 0.4 MG SL tablet Place 1 tablet (0.4 mg total) under the tongue every 5 (five) minutes as needed for chest pain. 25 tablet 1   No facility-administered medications prior to visit.     PAST MEDICAL HISTORY: Past Medical History:  Diagnosis Date  . CAD (coronary artery disease)    Cypher stent 09/2002  . CHF (congestive heart failure) ([place])   . Diabetes mellitus ([place])   . Hyperlipidemia   . Hypertension   . Myocardial infarction ([place])    2003,went into cardiac shock  . Osteoporosis   . Seizures ([place])    1st seizure age late 45's; most recent 07/28/17  . Sinusitis     PAST SURGICAL HISTORY: Past Surgical History:  Procedure Laterality Date  . ABDOMINAL HYSTERECTOMY    . APPENDECTOMY    . BREAST LUMPECTOMY WITH RADIOACTIVE SEED AND SENTINEL LYMPH NODE BIOPSY Left 03/16/2017   Procedure: INJECT BLUE DYE LEFT BREAST, LEFT BREAST LUMPECTOMY WITH RADIOACTIVE SEED AND LEFT AXILLARY DEEP SENTINEL LYMPH NODE  BIOPSY ADJACENT TISSUE TRANSFER;  Surgeon: [name], MD;  Location: [facility];  Service: General;  Laterality: Left;  . CARPAL TUNNEL RELEASE Bilateral   . COLONOSCOPY WITH PROPOFOL N/A 05/30/2015   Procedure: COLONOSCOPY WITH PROPOFOL;  Surgeon: Carol Ada, MD;  Location: [facility];  Service: Endoscopy;  Laterality: N/A;  . CORONARY ANGIOPLASTY WITH STENT PLACEMENT  10-02-2002  . Exploratory abdominal     Bleeding in abdomen after tubal  . TUBAL LIGATION    . UMBILICAL HERNIA REPAIR      FAMILY HISTORY: Family History  Problem Relation Age of Onset  . Dementia Mother   . Kidney disease Mother   . Heart attack Father 18       Died age 25  . Heart attack Brother 49  . Heart attack Brother 3    SOCIAL HISTORY:  Social History   Social History  . Marital status:  Married    Spouse name: Sonia Side  . Number of children: 3  . Years of education: 12   Occupational History  . Not on file.   Social History Main Topics  . Smoking status: Current Every Day Smoker    Packs/day: 2.00    Years: 30.00    Types: Cigarettes    Last attempt to quit: 03/09/2017  .  Smokeless tobacco: Never Used     Comment: e cig, 08/02/17 1 PPD  . Alcohol use No  . Drug use: No  . Sexual activity: Not on file   Other Topics Concern  . Not on file   Social History Narrative   Lives with husband.    Caffeine-      PHYSICAL EXAM  GENERAL EXAM/CONSTITUTIONAL: Vitals:  Vitals:   08/02/17 1036  BP: (!) 150/79  Pulse: 64  Weight: 125 lb 9.6 oz (57 kg)  Height: 5\' 5"  (1.651 m)     Body mass index is 20.9 kg/m.  Visual Acuity Screening   Right eye Left eye Both eyes  Without correction:     With correction: 20/30 20/40      Patient is in no distress; well developed, nourished and groomed; neck is supple  CARDIOVASCULAR:  Examination of carotid arteries is normal; no carotid bruits  Regular rate and rhythm, no murmurs  Examination of peripheral vascular system by observation and palpation is normal  EYES:  Ophthalmoscopic exam of optic discs and posterior segments is normal; no papilledema or hemorrhages  MUSCULOSKELETAL:  Gait, strength, tone, movements noted in Neurologic exam below  NEUROLOGIC: MENTAL STATUS:  No flowsheet data found.  awake, alert, oriented to person, place and time  recent and remote memory intact  normal attention and concentration  language fluent, comprehension intact, naming intact,   fund of knowledge appropriate  CRANIAL NERVE:   2nd - no papilledema on fundoscopic exam  2nd, 3rd, 4th, 6th - pupils equal and reactive to light, visual fields full to confrontation, extraocular muscles intact, no nystagmus  5th - facial sensation symmetric  7th - facial strength symmetric  8th - hearing intact  9th -  palate elevates symmetrically, uvula midline  11th - shoulder shrug symmetric  12th - tongue protrusion midline  MOTOR:   normal bulk and tone, full strength in the BUE, BLE  SENSORY:   normal and symmetric to light touch, temperature, vibration  COORDINATION:   finger-nose-finger, fine finger movements normal  REFLEXES:   deep tendon reflexes present and symmetric  GAIT/STATION:   narrow based gait; able to walk tandem    DIAGNOSTIC DATA (LABS, IMAGING, TESTING) - I reviewed patient records, labs, notes, testing and imaging myself where available.  Lab Results  Component Value Date   WBC 7.5 05/24/2017   HGB 15.2 (H) 05/24/2017   HCT 43.5 05/24/2017   MCV 76.2 (L) 05/24/2017   PLT 115 (L) 05/24/2017      Component Value Date/Time   NA 140 05/24/2017 1722   K 3.9 05/24/2017 1722   CL 106 05/24/2017 1722   CO2 26 05/24/2017 1722   GLUCOSE 103 (H) 05/24/2017 1722   BUN 11 05/24/2017 1722   CREATININE 0.47 05/24/2017 1722   CALCIUM 9.6 05/24/2017 1722   PROT 7.2 05/24/2017 1722   ALBUMIN 4.3 05/24/2017 1722   AST 33 05/24/2017 1722   ALT 30 05/24/2017 1722   ALKPHOS 40 05/24/2017 1722   BILITOT 0.7 05/24/2017 1722   GFRNONAA >60 05/24/2017 1722   GFRAA >60 05/24/2017 1722   No results found for: CHOL, HDL, LDLCALC, LDLDIRECT, TRIG, CHOLHDL Lab Results  Component Value Date   HGBA1C 8.5 (H) 03/09/2017   No results found for: VITAMINB12 No results found for: TSH   05/24/17 CT head [I reviewed images myself and agree with interpretation. -VRP]  - No acute intracranial abnormality noted on this unenhanced exam.  ASSESSMENT AND PLAN  71 y.o. year old female here with 3 seizures in life.  One seizure may have been associated with hypoglycemia.  The other 2 seizures were not associated with any triggering factors.  We will proceed with further workup and start antiseizure medication.   Ddx: seizure (3 in life; ? Hypoglycemia vs other secondary  cause)  1. Seizures ([place])   2. Type 2 diabetes mellitus with complication, with long-term current use of insulin ([place])   3. Malignant neoplasm of upper-inner quadrant of left breast in female, estrogen receptor positive ([place])      PLAN:  - check MRI brain, EEG  - start levetiracetam 500mg  twice a day   - According to [place] law, you can not drive unless you are seizure / syncope free for at least 6 months and under physician's care.   - Please maintain precautions. Do not participate in activities where a loss of awareness could harm you or someone else. No swimming alone, no tub bathing, no hot tubs, no driving, no operating motorized vehicles (cars, ATVs, motocycles, etc), lawnmowers, power tools or firearms. No standing at heights, such as rooftops, ladders or stairs. Avoid hot objects such as stoves, heaters, open fires. Wear a helmet when riding a bicycle, scooter, skateboard, etc. and avoid areas of traffic. Set your water heater to 120 degrees or less.   Orders Placed This Encounter  Procedures  . MR BRAIN W WO CONTRAST  . EEG adult   Meds ordered this encounter  Medications  . levETIRAcetam (KEPPRA) 500 MG tablet    Sig: Take 1 tablet (500 mg total) by mouth 2 (two) times daily.    Dispense:  60 tablet    Refill:  12   Return in about 2 months (around 10/02/2017).  I reviewed images, labs, notes, records myself. I summarized findings and reviewed with patient, for this high risk condition (seizure disorder) requiring high complexity decision making.    Penni Bombard, MD 66/03/44, 99:77 AM Certified in Neurology, Neurophysiology and Neuroimaging  [facility] [address], [place], [place] [address] [phone]

## 2017-08-05 ENCOUNTER — Ambulatory Visit
Admission: RE | Admit: 2017-08-05 | Discharge: 2017-08-05 | Disposition: A | Discharge status: Home / Self Care | Payer: Medicare Other | Source: Ambulatory Visit | Attending: Diagnostic Neuroimaging | Admitting: Diagnostic Neuroimaging

## 2017-08-05 DIAGNOSIS — R569 Unspecified convulsions: Secondary | ICD-10-CM

## 2017-08-05 MED ORDER — GADOBENATE DIMEGLUMINE 529 MG/ML IV SOLN
15.0000 mL | Freq: Once | INTRAVENOUS | Status: AC | PRN
  Administered 2017-08-05: 11 mL via INTRAVENOUS

## 2017-08-15 ENCOUNTER — Ambulatory Visit: Payer: Medicare Other | Admitting: Diagnostic Neuroimaging

## 2017-08-15 DIAGNOSIS — R569 Unspecified convulsions: Secondary | ICD-10-CM

## 2017-08-18 NOTE — Procedures (Signed)
   [facility]  EEG (ELECTROENCEPHALOGRAM) REPORT   STUDY DATE: 08/15/17 PATIENT NAME: Monica [name] DOB: 11/13/1945 MRN: [id number]  ORDERING CLINICIAN: Andrey Spearman, MD   TECHNOLOGIST: Laretta Alstrom  TECHNIQUE: Electroencephalogram was recorded utilizing standard 10-20 system of lead placement and reformatted into average and bipolar montages.  RECORDING TIME: 30 minutes  ACTIVATION: hyperventilation and photic stimulation  CLINICAL INFORMATION: 71 year old female with seziures.  FINDINGS: Posterior dominant background rhythms, which attenuate with eye opening, ranging 9-10 hertz and 50-60 microvolts. No focal, lateralizing, epileptiform activity or seizures are seen. Patient recorded in the awake and drowsy state. EKG channel shows regular rhythm of 70-75 beats per minute.   IMPRESSION:  Normal EEG in awake and drowsy state.    INTERPRETING PHYSICIAN:  Penni Bombard, MD Certified in Neurology, Neurophysiology and Neuroimaging  [facility] [address] [place], [place] [address] [phone]

## 2017-08-22 ENCOUNTER — Telehealth: Payer: Self-pay | Admitting: *Deleted

## 2017-08-22 NOTE — Telephone Encounter (Signed)
Relayed that EEG results are normal..  She verbalized understanding.Marland Kitchen

## 2017-08-22 NOTE — Telephone Encounter (Signed)
-----   Message from Penni Bombard, MD sent at 08/21/2017  9:09 PM EST ----- Normal EEG. Please call patient. Continue current plan. -VRP

## 2017-08-22 NOTE — Telephone Encounter (Signed)
I spoke to pt and relayed her normal EEG results (no sz) continue current plan.  She also asked about MRI results.   I relayed not acute finding, other then mild chronic microvascular ischemic changes.  She verbalized understanding.

## 2017-08-24 NOTE — Progress Notes (Signed)
[facility]  [phone] [phone]  Clinic Follow-up Note   Patient Care Team: Jani Gravel, MD as PCP - General (Internal Medicine) [name], MD as Consulting Physician (General Surgery) Truitt Merle, MD as Consulting Physician (Hematology) Kyung Rudd, MD as Consulting Physician (Radiation Oncology) 08/25/2017  CHIEF COMPLAINTS:  Follow up left Breast cancer   Oncology History   Cancer Staging Malignant neoplasm of upper-inner quadrant of left breast in female, estrogen receptor positive ([place]) Staging form: Breast, AJCC 8th Edition - Clinical stage from 02/16/2017: Stage IA (cT1b, cN0, cM0, G2, ER: Positive, PR: Positive, HER2: Negative) - Unsigned Staging comments: Staged at breast conference  - Pathologic stage from 03/16/2017: Stage IA (pT1b, pN0, cM0, G2, ER: Positive, PR: Positive, HER2: Negative, Oncotype DX score: 27) - Signed by Truitt Merle, MD on 04/24/2017       Malignant neoplasm of upper-inner quadrant of left breast in female, estrogen receptor positive ([place])   02/09/2017 Initial Diagnosis    Malignant neoplasm of upper-inner quadrant of left breast in female, estrogen receptor positive ([place])      02/09/2017 Initial Biopsy    Diagnosis Breast, left, needle core biopsy - INVASIVE DUCTAL CARCINOMA, 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 Dalbert Batman       03/16/2017 Pathology Results    PATHOLOGY REPORT Diagnosis 03/16/17 1. Breast, lumpectomy, Left - INVASIVE DUCTAL CARCINOMA, NOTTINGHAM GRADE 2 OF 3, 0.9 CM - DUCTAL CARCINOMA IN SITU - MARGINS UNINVOLVED BY CARCINOMA (0.3 CM POSTERIOR  MARGIN) - PREVIOUS BIOPSY SITE CHANGES - SEE ONCOLOGY TABLE BELOW 2. Lymph node, sentinel, biopsy, Left axillary #1 - NO CARCINOMA IDENTIFIED IN ONE LYMPH NODE (0/1) 3. Lymph node, sentinel, biopsy, Left axillary #2 - NO CARCINOMA IDENTIFIED IN ONE LYMPH NODE (0/1) 4. Lymph node, sentinel, biopsy, Left axillary #3 - NO CARCINOMA IDENTIFIED IN ONE LYMPH NODE (0/1) 5. Lymph node, sentinel, biopsy, Left axillary #4 - NO CARCINOMA IDENTIFIED IN ONE LYMPH NODE (0/1)       03/16/2017 Oncotype testing    Recurrance score of 27 with a 18% distance recurrance in the net 10 years with Tamoxifen alone       05/19/2017 - 06/16/2017 Radiation Therapy    Radiation with Dr. Lisbeth Renshaw      06/2017 -  Anti-estrogen oral therapy    anastrozole 1 mg once a day starting late 06/2017          HISTORY OF PRESENTING ILLNESS: 02/16/17 Monica [name] 71 y.o. female is here because of newly diagnosed left breast cancer. She is accompanied by her multiple family members to our disciplinary breast clinic today.  This was found by a mammogram screening of the left breast. She had a abnormal benign mammogram 35 years ago and had surgery to remove the mass form the left breast. She denies feeling a lump herself and denies any change lately. She reports having a fair appetite and no weight loss. She ranges 124/127 pounds. She denies and pain and aches but has pain in her left knee joint but tolerable as well in her hip. She takes tylenol for pain. She has a history of DM and  follows up with Dr. Maudie Mercury. Her sugar has been running high lightly. Had a CHF and MI in 2003 and has a stent.She denies chest pain and her last echo was in 2016 and is normal. She has not had a stroke. No history of breast cancer in family. She still smokes.   Her most exercise is walking her dog and she remain moving around the house. She will see her PCP next week   GYN HISTORY  Menarchal: 11 LMP: many years ago, not sure  Contraceptive: none  HRT:  none  G3P3: She was 25 at first live births.   CURRENT THERAPY: anastrozole 1 mg once a day starting 06/2017    INTERVAL HISTORY:  RONNETTE [name] is here for a follow up. She presents to the clinic today accompanied by her husband.  She notes she has been taking Anastrozole and has not noticed any changes since starting. She denies hot flashes and joint issues. Her weight is overall stable. Since she last saw me she had a seizure in [place]. She was put on Keppra and managed by her neurologist. She had not noticed much change in her blood sugar. She does not due much exercise other than waking her dog. She is worried about another seizure. Her breast or slightly sensitive but overall healing well. She has already gotten her flu and Pneumonia shot for the year.    MEDICAL HISTORY:  Past Medical History:  Diagnosis Date  . CAD (coronary artery disease)    Cypher stent 09/2002  . CHF (congestive heart failure) ([place])   . Diabetes mellitus ([place])   . Hyperlipidemia   . Hypertension   . Myocardial infarction ([place])    2003,went into cardiac shock  . Osteoporosis   . Seizures ([place])    1st seizure age late 38's; most recent 07/28/17  . Sinusitis     SURGICAL HISTORY: Past Surgical History:  Procedure Laterality Date  . ABDOMINAL HYSTERECTOMY    . APPENDECTOMY    . BREAST LUMPECTOMY WITH RADIOACTIVE SEED AND SENTINEL LYMPH NODE BIOPSY Left 03/16/2017   Procedure: INJECT BLUE DYE LEFT BREAST, LEFT BREAST LUMPECTOMY WITH RADIOACTIVE SEED AND LEFT AXILLARY DEEP SENTINEL LYMPH NODE  BIOPSY ADJACENT TISSUE TRANSFER;  Surgeon: [name], MD;  Location: [facility];  Service: General;  Laterality: Left;  . CARPAL TUNNEL RELEASE Bilateral   . COLONOSCOPY WITH PROPOFOL N/A 05/30/2015   Procedure: COLONOSCOPY WITH PROPOFOL;  Surgeon: Carol Ada, MD;  Location: [facility];  Service: Endoscopy;  Laterality: N/A;  . CORONARY ANGIOPLASTY WITH STENT PLACEMENT  10-02-2002  . Exploratory abdominal      Bleeding in abdomen after tubal  . TUBAL LIGATION    . UMBILICAL HERNIA REPAIR      SOCIAL HISTORY: Social History   Socioeconomic History  . Marital status: Married    Spouse name: Sonia Side  . Number of children: 3  . Years of education: 33  . Highest education level: Not on file  Social Needs  . Financial resource strain: Not on file  . Food insecurity - worry: Not on file  . Food insecurity - inability: Not on file  . Transportation needs - medical: Not on file  . Transportation needs - non-medical: Not on file  Occupational History  . Not on file  Tobacco Use  . Smoking status: Current Every Day Smoker    Packs/day: 2.00    Years: 30.00    Pack years: 60.00    Types: Cigarettes  Last attempt to quit: 03/09/2017    Years since quitting: 0.4  . Smokeless tobacco: Never Used  . Tobacco comment: e cig, 08/02/17 1 PPD  Substance and Sexual Activity  . Alcohol use: No    Alcohol/week: 0.0 oz  . Drug use: No  . Sexual activity: Not on file  Other Topics Concern  . Not on file  Social History Narrative   Lives with husband.    Caffeine-     FAMILY HISTORY: Family History  Problem Relation Age of Onset  . Dementia Mother   . Kidney disease Mother   . Heart attack Father 46       Died age 79  . Heart attack Brother 24  . Heart attack Brother 17    ALLERGIES:  is allergic to ace inhibitors; keflex [cephalexin]; phenobarbital; sulfate; and altace [ramipril].  MEDICATIONS:  Current Outpatient Medications  Medication Sig Dispense Refill  . acetaminophen (TYLENOL) 500 MG tablet Take 1,000 mg by mouth every 6 (six) hours as needed for moderate pain or headache.    . anastrozole (ARIMIDEX) 1 MG tablet Take 1 tablet (1 mg total) daily by mouth. 90 tablet 1  . aspirin EC 81 MG tablet Take 81 mg by mouth at bedtime.    Marland Kitchen atorvastatin (LIPITOR) 40 MG tablet Take 40 mg by mouth at bedtime.    . Calcium Carbonate-Vitamin D 600-400 MG-UNIT per tablet Take 1 tablet by mouth 2  (two) times daily.    . carvedilol (COREG) 3.125 MG tablet Take 3.125 mg by mouth 2 (two) times daily.     . clonazePAM (KLONOPIN) 1 MG tablet Take 1 mg by mouth at bedtime.     Marland Kitchen glimepiride (AMARYL) 4 MG tablet Take 4 mg by mouth 2 (two) times daily.    . halobetasol (ULTRAVATE) 0.05 % cream Apply 1 application topically 2 (two) times daily as needed (rash on leg). Applied to rash on leg (ankle area)    . Insulin Detemir (LEVEMIR) 100 UNIT/ML Pen Inject 17-25 Units into the skin 2 (two) times daily. 25 units in the morning & 17 units at night    . JANUVIA 100 MG tablet Take 100 mg by mouth at bedtime.     . levETIRAcetam (KEPPRA) 500 MG tablet Take 1 tablet (500 mg total) by mouth 2 (two) times daily. 60 tablet 12  . losartan (COZAAR) 50 MG tablet Take 50 mg by mouth at bedtime.     . metFORMIN (GLUCOPHAGE-XR) 500 MG 24 hr tablet Take 500 mg by mouth 2 (two) times daily.    . Multiple Vitamin (MULTIVITAMIN WITH MINERALS) TABS tablet Take 1 tablet by mouth at bedtime.    . Omega-3 Fatty Acids (FISH OIL) 1200 MG CAPS Take 2,400 mg by mouth 2 (two) times daily.    Marland Kitchen omeprazole (PRILOSEC) 20 MG capsule Take 20 mg by mouth at bedtime.     Glory Rosebush DELICA LANCETS 62U MISC 1 each by Other route 3 (three) times daily.     Glory Rosebush VERIO test strip 1 each by Other route 3 (three) times daily.     . pioglitazone (ACTOS) 30 MG tablet Take 30 mg by mouth at bedtime.    Vladimir Faster Glycol-Propyl Glycol (SYSTANE) 0.4-0.3 % SOLN Place 1 drop into both eyes 2 (two) times daily as needed (dry eyes).     . risedronate (ACTONEL) 150 MG tablet Take 150 mg by mouth every 30 (thirty) days.    Marland Kitchen sertraline (ZOLOFT) 100  MG tablet Take 100 mg by mouth at bedtime.    . nitroGLYCERIN (NITROSTAT) 0.4 MG SL tablet Place 1 tablet (0.4 mg total) under the tongue every 5 (five) minutes as needed for chest pain. 25 tablet 1   No current facility-administered medications for this visit.     REVIEW OF SYSTEMS:     Constitutional: Denies fevers, chills or abnormal night sweats  Eyes: Denies blurriness of vision, double vision or watery eyes Ears, nose, mouth, throat, and face: Denies mucositis or sore throat Respiratory: Denies cough, dyspnea or wheezes Cardiovascular: Denies palpitation, chest discomfort or lower extremity swelling Gastrointestinal:  Denies nausea, heartburn or change in bowel habits PRF:[id number] Skin: Denies abnormal skin rashes Lymphatics: Denies new lymphadenopathy or easy bruising Neurological:Denies numbness, tingling or new weaknesses Behavioral/Psych: Mood is stable, no new changes  All other systems were reviewed with the patient and are negative. Breast: (+)Mild tenderness post radiation/surgery  PHYSICAL EXAMINATION: ECOG PERFORMANCE STATUS: 0 - Asymptomatic 08/25/17 vitals:  W: 127.3 lbs T97.7 Ox: 95% P:64 R:18 BP: 115/71  GENERAL:alert, no distress and comfortable SKIN: skin color, texture, turgor are normal, no rashes or significant lesions EYES: normal, conjunctiva are pink and non-injected, sclera clear OROPHARYNX:no exudate, no erythema and lips, buccal mucosa, and tongue normal  NECK: supple, thyroid normal size, non-tender, without nodularity LYMPH:  no palpable lymphadenopathy in the cervical, axillary or inguinal LUNGS: clear to auscultation and percussion with normal breathing effort HEART: regular rate & rhythm and no murmurs and no lower extremity edema ABDOMEN: abdomen soft, non-tender and normal bowel sounds Musculoskeletal:no cyanosis of digits and no clubbing  PSYCH: alert & oriented x 3 with fluent speech NEURO: no focal motor/sensory deficits Breasts: Breast inspection showed them to be symmetrical with no nipple discharge. Palpation of the breasts and axilla revealed no obvious mass that I could appreciate. (+) left UOQ and left axilla incisions, healed well. No palpable mass.(+) Skin color almost back to normal   LABORATORY DATA:  I have  reviewed the data as listed CBC Latest Ref Rng & Units 08/25/2017 05/24/2017 03/09/2017  WBC 3.9 - 10.3 10e3/uL 5.4 7.5 6.7  Hemoglobin 11.6 - 15.9 g/dL 14.8 15.2(H) 14.8  Hematocrit 34.8 - 46.6 % 44.4 43.5 45.3  Platelets 145 - 400 10e3/uL 95(L) 115(L) 121(L)    CMP Latest Ref Rng & Units 08/25/2017 05/24/2017 03/09/2017  Glucose 70 - 140 mg/dl 149(H) 103(H) 113(H)  BUN 7.0 - 26.0 mg/dL 10.1 11 <5(L)  Creatinine 0.6 - 1.1 mg/dL 0.7 0.47 0.50  Sodium 136 - 145 mEq/L 139 140 135  Potassium 3.5 - 5.1 mEq/L 4.3 3.9 4.0  Chloride 101 - 111 mmol/L - 106 100(L)  CO2 22 - 29 mEq/L '26 26 25  '$ Calcium 8.4 - 10.4 mg/dL 9.4 9.6 10.0  Total Protein 6.4 - 8.3 g/dL 7.2 7.2 -  Total Bilirubin 0.20 - 1.20 mg/dL 0.49 0.7 -  Alkaline Phos 40 - 150 U/L 72 40 -  AST 5 - 34 U/L 24 33 -  ALT 0 - 55 U/L 33 30 -    PATHOLOGY REPORT Diagnosis 03/16/17 1. Breast, lumpectomy, Left - INVASIVE DUCTAL CARCINOMA, NOTTINGHAM GRADE 2 OF 3, 0.9 CM - DUCTAL CARCINOMA IN SITU - MARGINS UNINVOLVED BY CARCINOMA (0.3 CM POSTERIOR MARGIN) - PREVIOUS BIOPSY SITE CHANGES - SEE ONCOLOGY TABLE BELOW 2. Lymph node, sentinel, biopsy, Left axillary #1 - NO CARCINOMA IDENTIFIED IN ONE LYMPH NODE (0/1) 3. Lymph node, sentinel, biopsy, Left axillary #2 - NO CARCINOMA IDENTIFIED  IN ONE LYMPH NODE (0/1) 4. Lymph node, sentinel, biopsy, Left axillary #3 - NO CARCINOMA IDENTIFIED IN ONE LYMPH NODE (0/1) 5. Lymph node, sentinel, biopsy, Left axillary #4 - NO CARCINOMA IDENTIFIED IN ONE LYMPH NODE (0/1) Microscopic Comment 1. BREAST, INVASIVE TUMOR Procedure: Excision Laterality: Left Tumor Size: 0.9 x 0.8 x 0.7 cm Histologic Type: Invasive carcinoma of no special type (ductal, not otherwise specified) Grade: Nottingham Grade 2 Tubular Differentiation: 2 Nuclear Pleomorphism: 2 Mitotic Count: 2 Ductal Carcinoma in Situ (DCIS): Present (solid, cribriform patterns) Margins: Uninvolved by carcinoma Invasive carcinoma, distance  from closest margin: 0.3 cm (posterior) DCIS, distance from closest margin: 0.4 cm (superior) Regional Lymph Nodes: Number of Lymph Nodes Examined: 4 Microscopic Comment(continued) Number of Sentinel Lymph Nodes Examined: 4 Lymph Nodes with Macrometastases: 0 Lymph Nodes with Micrometastases: 0 Lymph Nodes with Isolated Tumor Cells: 0 Breast Prognostic Profile: See [id number] (02/10/2017) Estrogen Receptor: Positive (100%, Strong) Progesterone Receptor: Positive (15%, Strong) Her2: Negative Ki-67: 15% Best tumor block for sendout testing: 1B Treatment Effect: No known presurgical therapy Pathologic Stage Classification (pTNM, AJCC 8th Edition): Primary Tumor: pT1b Regional Lymph Nodes: pN0 DAWN  Diagnosis 02/09/17 Breast, left, needle core biopsy - INVASIVE DUCTAL CARCINOMA. - DUCTAL CARCINOMA IN SITU. - SEE COMMENT. Microscopic Comment The carcinoma appears grade I - II. A breast prognostic profile will be performed and the results reported separately. The results were called to [facility] on 02/10/2017. Results: IMMUNOHISTOCHEMICAL AND MORPHOMETRIC ANALYSIS PERFORMED MANUALLY Estrogen Receptor: 100%, POSITIVE, STRONG STAINING INTENSITY Progesterone Receptor: 15%, POSITIVE, STRONG STAINING INTENSITY Proliferation Marker Ki67: 15% REFERENCE RANGE ESTROGEN RECEPTOR NEGATIVE 0% POSITIVE =>1% REFERENCE RANGE PROGESTERONE RECEPTOR NEGATIVE 0% POSITIVE =>1% All controls stained appropriately Results: HER2 - NEGATIVE RATIO OF HER2/CEP17 SIGNALS 1.24 AVERAGE HER2 COPY NUMBER PER CELL 1.80 Reference Range: NEGATIVE HER2/CEP17 Ratio <2.0 and average HER2 copy number <4.0 EQUIVOCAL HER2/CEP17 Ratio <2.0 and average HER2 copy number >=4.0 and <6.0   ONCOTYPE 03/16/17    RADIOGRAPHIC STUDIES: I have personally reviewed the radiological images as listed and agreed with the findings in the report. Mr Jeri Cos Wo Contrast  Result Date: 08/07/2017  [facility] [address], [place], [place] [address] [phone] NEUROIMAGING REPORT STUDY DATE: 08/05/2017 PATIENT NAME: HARBOR PASTER DOB: [birth year] MRN: [id number] EXAM: MRI Brain with and without contrast ORDERING CLINICIAN: Andrey Spearman M.D. CLINICAL HISTORY: 71 year old woman with seizures COMPARISON FILMS: None TECHNIQUE:MRI of the brain with and without contrast was obtained utilizing 5 mm axial slices with T1, T2, T2 flair, SWI and diffusion weighted views.  T1 sagittal, T2 coronal and postcontrast views in the axial and coronal plane were obtained. Additional dissection T2-weighted coronal images were performed. CONTRAST: 11 ml [employer] IMAGING SITE: [facility], [place]. FINDINGS: On sagittal images, the spinal cord is imaged caudally to C4 and is normal in caliber.   The contents of the posterior fossa are of normal size and position.   The pituitary gland and optic chiasm appear normal.    Brain volume appears normal for age.   The ventricles are normal in size for age and without distortion.  There are no abnormal extra-axial collections of fluid.  The cerebellum and brainstem appears normal.   The deep gray matter appears normal.  The deep structures of the temporal lobes appear normal and symmetric.  In the hemispheres, there are scattered T2/FLAIR hyperintense foci, left greater than right consistent with mild chronic microvascular ischemic changes. There is a  small confluency in the left periatrial region. None of these appear to be acute..   Diffusion weighted images are normal.  Susceptibility weighted images are normal.   The orbits appear normal.   The VIIth/VIIIth nerve complex appears normal.  The mastoid air cells appear normal.  The paranasal sinuses appear normal.  Flow voids are identified within the major intracerebral arteries.  After the infusion of contrast material, a normal enhancement pattern is noted.    This MRI of the brain with and without  contrast shows the following: 1.    Mild chronic microvascular ischemic changes, left greater than right. 2.    There are no acute findings and there is a normal enhancement pattern. INTERPRETING PHYSICIAN: Richard A. Felecia Shelling, MD, PhD, FAAN Certified in  Neuroimaging by [employer]     ASSESSMENT & PLAN:  MANDEE PLUTA is 71 y.o. caucasian female who has a history of an MI, CHF and DM. She is here for her newly diagnosed Breast Cancer  1. 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 -the Oncotype Dx result was reviewed with her in details. She has intermedia risk based on the recurrence score 27, which predicts 10 year distant recurrence after 5 years of tamoxifen 18%. The benefit of chemotherapy in the intermedia risk group is small and controversial. Given her advanced age, comorbidities, strong ER and PR expression in her tumor, we decided not to pursue adjuvant chemotherapy. -She completed radiation on 06/17/17 and tolerated very well with only mild reaction.  -She started adjuvant anastrozole in late 06/2017 and has tolerated well with no issues so far. -She is clinically doing well. Lab reviewed, Her plt is overall stable and her sugar is 149, otherwise her CBC and CMP are within normal limits. There is no clinical concern for recurrence.  -Proceed with survivorship clinic next month and f/u with me in 5 months      2. DM, poorly controlled -uncontrolled, she is somewhat oral medication and insulin -Follows up with Dr. Maudie Mercury her PCP -Had diabetic seizure from low blood sugar on 05/24/17.  -I previously discussed to the possible drug interaction with anastrozole and her DM medication.   3. Mild thrombocytopenia  -She has long-standing history of mild pancytopenia, related around  100, no history of significant bleeding.  -No history of liver disease or alcohol abuse. 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 -We'll continue monitoring, Stable   4. Osteopenia  -Continue calcium and vitamin D -Based on her DEX from 06/30/17 she is osteopenic with a left femur neck T-Score of -2.4  -We discussed the potential negative impact on her bone density from anastrozole.  We will follow-up her bone density scan every 2 years -Encouraged her to consider biphosphonate. She will think about it   PLAN: -Continue anastrozole  -survivorship Clinic next month  -Lab and f/u in 5 months     Orders Placed This Encounter  Procedures  . Comprehensive metabolic panel    Standing Status:   Standing    Number of Occurrences:   30    Standing Expiration Date:   08/24/2022  . CBC & Diff and Retic    Standing Status:   Standing    Number of Occurrences:   30    Standing Expiration Date:   08/24/2022    All questions were answered. The  patient knows to call the clinic with any problems, questions or concerns. I spent 25 minutes counseling the patient face to face. The total time spent in the appointment was 30 minutes and more than 50% was on counseling.  This document serves as a record of services personally performed by Truitt Merle, MD. It was created on her behalf by Joslyn Devon, a trained medical scribe. The creation of this record is based on the scribe's personal observations and the provider's statements to them.    I have reviewed the above documentation for accuracy and completeness, and I agree with the above.     Truitt Merle, MD 08/25/2017

## 2017-08-25 ENCOUNTER — Other Ambulatory Visit (HOSPITAL_BASED_OUTPATIENT_CLINIC_OR_DEPARTMENT_OTHER): Payer: Medicare Other

## 2017-08-25 ENCOUNTER — Telehealth: Payer: Self-pay | Admitting: Hematology

## 2017-08-25 ENCOUNTER — Encounter: Payer: Self-pay | Admitting: Hematology

## 2017-08-25 ENCOUNTER — Ambulatory Visit: Payer: Medicare Other | Admitting: Hematology

## 2017-08-25 DIAGNOSIS — E119 Type 2 diabetes mellitus without complications: Secondary | ICD-10-CM | POA: Diagnosis not present

## 2017-08-25 DIAGNOSIS — Z794 Long term (current) use of insulin: Secondary | ICD-10-CM

## 2017-08-25 DIAGNOSIS — D696 Thrombocytopenia, unspecified: Secondary | ICD-10-CM | POA: Diagnosis not present

## 2017-08-25 DIAGNOSIS — I2510 Atherosclerotic heart disease of native coronary artery without angina pectoris: Secondary | ICD-10-CM

## 2017-08-25 DIAGNOSIS — M8580 Other specified disorders of bone density and structure, unspecified site: Secondary | ICD-10-CM | POA: Diagnosis not present

## 2017-08-25 DIAGNOSIS — C50212 Malignant neoplasm of upper-inner quadrant of left female breast: Secondary | ICD-10-CM

## 2017-08-25 DIAGNOSIS — E118 Type 2 diabetes mellitus with unspecified complications: Secondary | ICD-10-CM

## 2017-08-25 DIAGNOSIS — Z170 Estrogen receptor positive status [ER+]: Secondary | ICD-10-CM

## 2017-08-25 DIAGNOSIS — Z79811 Long term (current) use of aromatase inhibitors: Secondary | ICD-10-CM

## 2017-08-25 DIAGNOSIS — E2839 Other primary ovarian failure: Secondary | ICD-10-CM

## 2017-08-25 LAB — CBC & DIFF AND RETIC
BASO%: 0 % (ref 0.0–2.0)
BASOS ABS: 0 10*3/uL (ref 0.0–0.1)
EOS%: 2 % (ref 0.0–7.0)
Eosinophils Absolute: 0.1 10*3/uL (ref 0.0–0.5)
HEMATOCRIT: 44.4 % (ref 34.8–46.6)
HGB: 14.8 g/dL (ref 11.6–15.9)
IMMATURE RETIC FRACT: 3.3 % (ref 1.60–10.00)
LYMPH#: 1.6 10*3/uL (ref 0.9–3.3)
LYMPH%: 29.8 % (ref 14.0–49.7)
MCH: 27.1 pg (ref 25.1–34.0)
MCHC: 33.3 g/dL (ref 31.5–36.0)
MCV: 81.3 fL (ref 79.5–101.0)
MONO#: 0.3 10*3/uL (ref 0.1–0.9)
MONO%: 6 % (ref 0.0–14.0)
NEUT#: 3.3 10*3/uL (ref 1.5–6.5)
NEUT%: 62.2 % (ref 38.4–76.8)
PLATELETS: 95 10*3/uL — AB (ref 145–400)
RBC: 5.46 10*6/uL — ABNORMAL HIGH (ref 3.70–5.45)
RDW: 14.9 % — ABNORMAL HIGH (ref 11.2–14.5)
RETIC CT ABS: 87.91 10*3/uL (ref 33.70–90.70)
Retic %: 1.61 % (ref 0.70–2.10)
WBC: 5.4 10*3/uL (ref 3.9–10.3)

## 2017-08-25 LAB — COMPREHENSIVE METABOLIC PANEL
ALBUMIN: 4 g/dL (ref 3.5–5.0)
ALK PHOS: 72 U/L (ref 40–150)
ALT: 33 U/L (ref 0–55)
ANION GAP: 8 meq/L (ref 3–11)
AST: 24 U/L (ref 5–34)
BILIRUBIN TOTAL: 0.49 mg/dL (ref 0.20–1.20)
BUN: 10.1 mg/dL (ref 7.0–26.0)
CALCIUM: 9.4 mg/dL (ref 8.4–10.4)
CO2: 26 mEq/L (ref 22–29)
CREATININE: 0.7 mg/dL (ref 0.6–1.1)
Chloride: 105 mEq/L (ref 98–109)
EGFR: >60 mL/min/{1.73_m2} (ref >60)
Glucose: 149 mg/dl — ABNORMAL HIGH (ref 70–140)
Potassium: 4.3 mEq/L (ref 3.5–5.1)
Sodium: 139 mEq/L (ref 136–145)
TOTAL PROTEIN: 7.2 g/dL (ref 6.4–8.3)

## 2017-08-25 MED ORDER — ANASTROZOLE 1 MG PO TABS: 1.0000 mg | ORAL_TABLET | Freq: Every day | ORAL | 1 refills | Status: DC

## 2017-08-25 NOTE — Telephone Encounter (Signed)
Gave avs and calendar for April 2019 °

## 2017-08-26 ENCOUNTER — Encounter: Payer: Self-pay | Admitting: Hematology

## 2017-09-07 ENCOUNTER — Telehealth: Payer: Self-pay

## 2017-09-07 NOTE — Telephone Encounter (Signed)
Spoke with pt to remind of SCP visit on 09/15/17 at 2 pm.  Pt expressed thanks and also verified other appts with LPN.

## 2017-09-14 NOTE — Progress Notes (Signed)
CLINIC:  Survivorship   REASON FOR VISIT:  Routine follow-up post-treatment for a recent history of breast cancer.  BRIEF ONCOLOGIC HISTORY:  Oncology History   Cancer Staging Malignant neoplasm of upper-inner quadrant of left breast in female, estrogen receptor positive ([place]) Staging form: Breast, AJCC 8th Edition - Clinical stage from 02/16/2017: Stage IA (cT1b, cN0, cM0, G2, ER: Positive, PR: Positive, HER2: Negative) - Unsigned Staging comments: Staged at breast conference  - Pathologic stage from 03/16/2017: Stage IA (pT1b, pN0, cM0, G2, ER: Positive, PR: Positive, HER2: Negative, Oncotype DX score: 27) - Signed by Truitt Merle, MD on 04/24/2017       Malignant neoplasm of upper-inner quadrant of left breast in female, estrogen receptor positive ([place])   02/09/2017 Initial Diagnosis    Malignant neoplasm of upper-inner quadrant of left breast in female, estrogen receptor positive ([place])      02/09/2017 Initial Biopsy    Diagnosis Breast, left, needle core biopsy - INVASIVE DUCTAL CARCINOMA, 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 Dalbert Batman       03/16/2017 Pathology Results    PATHOLOGY REPORT Diagnosis 03/16/17 1. Breast, lumpectomy, Left - INVASIVE DUCTAL CARCINOMA, NOTTINGHAM GRADE 2 OF 3, 0.9 CM - DUCTAL CARCINOMA IN SITU - MARGINS UNINVOLVED BY CARCINOMA (0.3 CM POSTERIOR MARGIN) - PREVIOUS BIOPSY SITE CHANGES - SEE ONCOLOGY TABLE BELOW 2. Lymph node, sentinel, biopsy, Left axillary #1 - NO CARCINOMA IDENTIFIED IN ONE LYMPH NODE (0/1) 3. Lymph node, sentinel, biopsy, Left axillary #2 - NO CARCINOMA IDENTIFIED IN ONE LYMPH NODE  (0/1) 4. Lymph node, sentinel, biopsy, Left axillary #3 - NO CARCINOMA IDENTIFIED IN ONE LYMPH NODE (0/1) 5. Lymph node, sentinel, biopsy, Left axillary #4 - NO CARCINOMA IDENTIFIED IN ONE LYMPH NODE (0/1)       03/16/2017 Oncotype testing    Recurrance score of 27 with a 18% distance recurrance in the net 10 years with Tamoxifen alone       05/19/2017 - 06/16/2017 Radiation Therapy    Radiation with Dr. Lisbeth Renshaw      06/2017 -  Anti-estrogen oral therapy    anastrozole 1 mg once a day starting late 06/2017         INTERVAL HISTORY:  Monica [name] presents to the [facility] today for our initial meeting to review her survivorship care plan detailing her treatment course for breast cancer, as well as monitoring long-term side effects of that treatment, education regarding health maintenance, screening, and overall wellness and health promotion.     Overall, Monica [name] reports feeling quite well.  She is taking Anastrozole daily and is toelrating it well.      REVIEW OF SYSTEMS:  Review of Systems  Constitutional: Negative for appetite change, chills, fatigue, fever and unexpected weight change.  HENT:   Negative for hearing loss and lump/mass.   Eyes: Negative for eye problems and icterus.  Respiratory: Negative for chest tightness, cough and shortness of breath.   Cardiovascular: Negative for chest pain, leg swelling and palpitations.  Gastrointestinal: Negative for abdominal distention, abdominal pain, constipation, diarrhea, nausea and vomiting.  Endocrine: Negative for hot flashes.  Genitourinary: Negative for difficulty urinating.  Skin: Negative for itching and rash.  Neurological: Negative for dizziness, extremity weakness, headaches and numbness.  Hematological: Negative for adenopathy. Does not bruise/bleed easily.  Psychiatric/Behavioral: Negative for depression. The patient is not nervous/anxious.   Breast: Denies any new nodularity, masses, tenderness, nipple  changes, or nipple discharge.      ONCOLOGY TREATMENT TEAM:  1. Surgeon:  Dr. Dalbert Batman at [facility] 2. Medical Oncologist: Dr. Burr Medico  3. Radiation Oncologist: Dr. Lisbeth Renshaw    PAST MEDICAL/SURGICAL HISTORY:  Past Medical History:  Diagnosis Date  . CAD (coronary artery disease)    Cypher stent 09/2002  . CHF (congestive heart failure) ([place])   . Diabetes mellitus ([place])   . Hyperlipidemia   . Hypertension   . Myocardial infarction ([place])    2003,went into cardiac shock  . Osteoporosis   . Seizures ([place])    1st seizure age late 66's; most recent 07/28/17  . Sinusitis    Past Surgical History:  Procedure Laterality Date  . ABDOMINAL HYSTERECTOMY    . APPENDECTOMY    . BREAST LUMPECTOMY WITH RADIOACTIVE SEED AND SENTINEL LYMPH NODE BIOPSY Left 03/16/2017   Procedure: INJECT BLUE DYE LEFT BREAST, LEFT BREAST LUMPECTOMY WITH RADIOACTIVE SEED AND LEFT AXILLARY DEEP SENTINEL LYMPH NODE  BIOPSY ADJACENT TISSUE TRANSFER;  Surgeon: [name], MD;  Location: [facility];  Service: General;  Laterality: Left;  . CARPAL TUNNEL RELEASE Bilateral   . COLONOSCOPY WITH PROPOFOL N/A 05/30/2015   Procedure: COLONOSCOPY WITH PROPOFOL;  Surgeon: Carol Ada, MD;  Location: [facility];  Service: Endoscopy;  Laterality: N/A;  . CORONARY ANGIOPLASTY WITH STENT PLACEMENT  10-02-2002  . Exploratory abdominal     Bleeding in abdomen after tubal  . TUBAL LIGATION    . UMBILICAL HERNIA REPAIR       ALLERGIES:  Allergies  Allergen Reactions  . Ace Inhibitors Dermatitis and Cough  . Keflex [Cephalexin] Itching  . Phenobarbital Other (See Comments)    Black spots on hands and feet.  . Sulfate Nausea And Vomiting and Other (See Comments)    Dizziness   . Altace [Ramipril] Other (See Comments)    UNSPECIFIED REACTION       CURRENT MEDICATIONS:  Outpatient Encounter Medications as of 09/15/2017  Medication Sig Note  . acetaminophen (TYLENOL) 500 MG tablet Take 1,000 mg by mouth every 6  (six) hours as needed for moderate pain or headache.   . anastrozole (ARIMIDEX) 1 MG tablet Take 1 tablet (1 mg total) daily by mouth.   Marland Kitchen aspirin EC 81 MG tablet Take 81 mg by mouth at bedtime.   Marland Kitchen atorvastatin (LIPITOR) 40 MG tablet Take 40 mg by mouth at bedtime.   . Calcium Carbonate-Vitamin D 600-400 MG-UNIT per tablet Take 1 tablet by mouth 2 (two) times daily.   . carvedilol (COREG) 3.125 MG tablet Take 3.125 mg by mouth 2 (two) times daily.    . clonazePAM (KLONOPIN) 1 MG tablet Take 1 mg by mouth at bedtime.    Marland Kitchen glimepiride (AMARYL) 4 MG tablet Take 4 mg by mouth 2 (two) times daily.   . halobetasol (ULTRAVATE) 0.05 % cream Apply 1 application topically 2 (two) times daily as needed (rash on leg). Applied to rash on leg (ankle area)   . Insulin Detemir (LEVEMIR) 100 UNIT/ML Pen Inject 17-25 Units into the skin 2 (two) times daily. 25 units in the morning & 17 units at night   . JANUVIA 100 MG tablet Take 100 mg by mouth at  bedtime.    . levETIRAcetam (KEPPRA) 500 MG tablet Take 1 tablet (500 mg total) by mouth 2 (two) times daily.   Marland Kitchen losartan (COZAAR) 50 MG tablet Take 50 mg by mouth at bedtime.    . metFORMIN (GLUCOPHAGE-XR) 500 MG 24 hr tablet Take 500 mg by mouth 2 (two) times daily.   . Multiple Vitamin (MULTIVITAMIN WITH MINERALS) TABS tablet Take 1 tablet by mouth at bedtime.   . Omega-3 Fatty Acids (FISH OIL) 1200 MG CAPS Take 2,400 mg by mouth 2 (two) times daily.   Marland Kitchen omeprazole (PRILOSEC) 20 MG capsule Take 20 mg by mouth at bedtime.    Glory Rosebush DELICA LANCETS 50N MISC 1 each by Other route 3 (three) times daily.    Glory Rosebush VERIO test strip 1 each by Other route 3 (three) times daily.    . pioglitazone (ACTOS) 30 MG tablet Take 30 mg by mouth at bedtime.   Vladimir Faster Glycol-Propyl Glycol (SYSTANE) 0.4-0.3 % SOLN Place 1 drop into both eyes 2 (two) times daily as needed (dry eyes).    . risedronate (ACTONEL) 150 MG tablet Take 150 mg by mouth every 30 (thirty) days.  03/03/2017: On the 28th day of each month  . sertraline (ZOLOFT) 100 MG tablet Take 100 mg by mouth at bedtime.   . nitroGLYCERIN (NITROSTAT) 0.4 MG SL tablet Place 1 tablet (0.4 mg total) under the tongue every 5 (five) minutes as needed for chest pain.    No facility-administered encounter medications on file as of 09/15/2017.      ONCOLOGIC FAMILY HISTORY:  Family History  Problem Relation Age of Onset  . Dementia Mother   . Kidney disease Mother   . Heart attack Father 15       Died age 50  . Heart attack Brother 27  . Heart attack Brother 55     GENETIC COUNSELING/TESTING: Not  SOCIAL HISTORY:  MIOSOTIS WETSEL is married and lives with her husband in [place], [place].  She has 3 children and they live in [place] and [place].  Monica [name] is currently retired.  She denies any current or history of alcohol, or illicit drug use.  She is a current smoker.  She has a 45 pack year tobacco history.     PHYSICAL EXAMINATION:  Vital Signs:   Vitals:   09/15/17 1411  BP: 134/80  Pulse: (!) 59  Resp: 16  SpO2: 96%   Filed Weights   09/15/17 1411  Weight: 124 lb 12.8 oz (56.6 kg)   General: Well-nourished, well-appearing female in no acute distress.  She is unaccompanied today.   HEENT: Head is normocephalic.  Pupils equal and reactive to light. Conjunctivae clear without exudate.  Sclerae anicteric. Oral mucosa is pink, moist.  Oropharynx is pink without lesions or erythema.  Lymph: No cervical, supraclavicular, or infraclavicular lymphadenopathy noted on palpation.  Cardiovascular: Regular rate and rhythm.Marland Kitchen Respiratory: Clear to auscultation bilaterally. Chest expansion symmetric; breathing non-labored.  Breast:  GI: Abdomen soft and round; non-tender, non-distended. Bowel sounds normoactive.  GU: Deferred.  Neuro: No focal deficits. Steady gait.  Psych: Mood and affect normal and appropriate for situation.  Extremities: No edema. MSK: No focal spinal  tenderness to palpation.  Full range of motion in bilateral upper extremities Skin: Warm and dry.  LABORATORY DATA:  None for this visit.  DIAGNOSTIC IMAGING:  None for this visit.      ASSESSMENT AND PLAN:  Ms.. [name] is a pleasant 71  y.o. female with Stage IA left breast invasive ductal carcinoma, ER+/PR+/HER2-, diagnosed in 02/2017, treated with lumpectomy, adjuvant radiation therapy, and anti-estrogen therapy with Anastrozole beginning in 06/2017.  She presents to the Survivorship Clinic for our initial meeting and routine follow-up post-completion of treatment for breast cancer.    1. Stage IA left breast cancer:  Monica [name] is continuing to recover from definitive treatment for breast cancer. She will follow-up with her medical oncologist, Dr. Burr Medico with history and physical exam per surveillance protocol.  She will continue her anti-estrogen therapy with Ansatrozole. Thus far, she is tolerating the Anastrozole well, with minimal side effects.Today, a comprehensive survivorship care plan and treatment summary was reviewed with the patient today detailing her breast cancer diagnosis, treatment course, potential late/long-term effects of treatment, appropriate follow-up care with recommendations for the future, and patient education resources.  A copy of this summary, along with a letter will be sent to the patient's primary care provider via mail/fax/In Basket message after today's visit.    2. Tobacco use: She is a current every day smoker of 1ppd x 45 years.  She meets criteria for lung cancer screening and I have ordered a low dose chest CT today.    3. Bone health:  Given Monica [name]'s age/history of breast cancer and her current treatment regimen including anti-estrogen therapy with Anastrozole, she is at risk for bone demineralization.  Her last DEXA scan was recently done at [facility], however we do not have the results  In the meantime, she was encouraged to increase her consumption of foods  rich in calcium, as well as increase her weight-bearing activities.  She was given education on specific activities to promote bone health.  4. Cancer screening:  Due to Monica [name]'s history and her age, she should receive screening for skin cancers, colon cancer, lung cancer, and gynecologic cancers.  The information and recommendations are listed on the patient's comprehensive care plan/treatment summary and were reviewed in detail with the patient.    5. Health maintenance and wellness promotion: Monica [name] was encouraged to consume 5-7 servings of fruits and vegetables per day. We reviewed the "Nutrition Rainbow" handout, as well as the handout "Take Control of Your Health and Reduce Your Cancer Risk" from the [place].  She was also encouraged to engage in moderate to vigorous exercise for 30 minutes per day most days of the week. We discussed the [employer] fitness program, which is designed for cancer survivors to help them become more physically fit after cancer treatments.  She was instructed to limit her alcohol consumption and was encouraged stop smoking.     7. Support services/counseling: It is not uncommon for this period of the patient's cancer care trajectory to be one of many emotions and stressors.  We discussed an opportunity for her to participate in the next session of [facility] ("Finding Your New Normal") support group series designed for patients after they have completed treatment.   Monica [name] was encouraged to take advantage of our many other support services programs, support groups, and/or counseling in coping with her new life as a cancer survivor after completing anti-cancer treatment.  She was offered support today through active listening and expressive supportive counseling.  She was given information regarding our available services and encouraged to contact me with any questions or for help enrolling in any of our support group/programs.    Dispo:   -Return  to cancer center in 01/2018 for follow up with Dr. Burr Medico  -  Mammogram due in 01/2018 -Follow up with surgery 03/2018 for follow up with Dr. Dalbert Batman -Lung Cancer screening CT scan due  -She is welcome to return back to the Survivorship Clinic at any time; no additional follow-up needed at this time.  -Consider referral back to survivorship as a long-term survivor for continued surveillance  A total of (30) minutes of face-to-face time was spent with this patient with greater than 50% of that time in counseling and care-coordination.   Gardenia Phlegm, [place] [phone]   Note: PRIMARY CARE PROVIDER Jani Gravel, [place] [phone]

## 2017-09-15 ENCOUNTER — Encounter: Payer: Self-pay | Admitting: Adult Health

## 2017-09-15 ENCOUNTER — Ambulatory Visit (HOSPITAL_BASED_OUTPATIENT_CLINIC_OR_DEPARTMENT_OTHER): Payer: Medicare Other | Admitting: Adult Health

## 2017-09-15 VITALS — BP 134/80 | HR 59 | Resp 16 | Ht 65.0 in | Wt 124.8 lb

## 2017-09-15 DIAGNOSIS — C50212 Malignant neoplasm of upper-inner quadrant of left female breast: Secondary | ICD-10-CM | POA: Diagnosis not present

## 2017-09-15 DIAGNOSIS — Z170 Estrogen receptor positive status [ER+]: Secondary | ICD-10-CM | POA: Diagnosis not present

## 2017-09-15 DIAGNOSIS — Z79811 Long term (current) use of aromatase inhibitors: Secondary | ICD-10-CM | POA: Diagnosis not present

## 2017-09-15 DIAGNOSIS — Z720 Tobacco use: Secondary | ICD-10-CM

## 2017-09-16 ENCOUNTER — Telehealth: Payer: Self-pay | Admitting: Adult Health

## 2017-09-16 NOTE — Telephone Encounter (Signed)
No 12/6 los at checkout

## 2017-10-05 ENCOUNTER — Encounter: Payer: Self-pay | Admitting: Diagnostic Neuroimaging

## 2017-10-05 ENCOUNTER — Ambulatory Visit: Payer: Medicare Other | Admitting: Diagnostic Neuroimaging

## 2017-10-05 VITALS — BP 116/71 | HR 72 | Ht 65.0 in | Wt 122.6 lb

## 2017-10-05 DIAGNOSIS — C50212 Malignant neoplasm of upper-inner quadrant of left female breast: Secondary | ICD-10-CM | POA: Diagnosis not present

## 2017-10-05 DIAGNOSIS — R569 Unspecified convulsions: Secondary | ICD-10-CM | POA: Diagnosis not present

## 2017-10-05 DIAGNOSIS — Z170 Estrogen receptor positive status [ER+]: Secondary | ICD-10-CM

## 2017-10-05 MED ORDER — LEVETIRACETAM 500 MG PO TABS: 500.0000 mg | ORAL_TABLET | Freq: Two times a day (BID) | ORAL | 4 refills | Status: DC

## 2017-10-05 NOTE — Progress Notes (Signed)
[facility]  PATIENT: Monica [name] DOB: 1946-03-24  REFERRING CLINICIAN: Georges Mouse, MD HISTORY FROM: patient and chart review REASON FOR VISIT: follow up   HISTORICAL  CHIEF COMPLAINT:  Chief Complaint  Patient presents with  . Follow-up    Patient doing well, no seizure activity.  . Seizures    HISTORY OF PRESENT ILLNESS:   UPDATE (10/05/17, VRP): Since last visit, doing well. Tolerating LEV 500mg  twice a day. No alleviating or aggravating factors.   PRIOR HPI (08/02/17): 71 year old female here for evaluation of seizures.  History of diabetes, heart disease, breast cancer.  At age late 71 years old patient had a seizure, lips turn blue, no tongue biting or incontinence.  She was apparently treated with antiseizure medication for several years and then discontinued.  May 24, 2017 patient was at [facility], when all of a sudden she collapsed and had a seizure.  Blood glucose was checked within 15 minutes and was 52.  No warning symptoms.  She ate breakfast that morning but no lunch.  She was taken to the hospital and evaluated. July 28, 2017 patient was at home, when all of a sudden she collapsed.  She was found stiff all over, with some convulsions.  Family gave her some juice to drink and blood sugar after testing was 120.  She was confused afterwards.  No tongue biting or incontinence. Patient has diagnosis of breast cancer.  Her first radiation treatment was May 11, 2017.   REVIEW OF SYSTEMS: Full 14 system review of systems performed and negative with exception of: fatigue light sens cold intolerance depression anxiety   ALLERGIES: Allergies  Allergen Reactions  . Ace Inhibitors Dermatitis and Cough  . Keflex [Cephalexin] Itching  . Phenobarbital Other (See Comments)    Black spots on hands and feet.  . Sulfate Nausea And Vomiting and Other (See Comments)    Dizziness   . Altace [Ramipril] Other (See Comments)    UNSPECIFIED REACTION       HOME MEDICATIONS: Outpatient Medications Prior to Visit  Medication Sig Dispense Refill  . acetaminophen (TYLENOL) 500 MG tablet Take 1,000 mg by mouth every 6 (six) hours as needed for moderate pain or headache.    . anastrozole (ARIMIDEX) 1 MG tablet Take 1 tablet (1 mg total) daily by mouth. 90 tablet 1  . aspirin EC 81 MG tablet Take 81 mg by mouth at bedtime.    Marland Kitchen atorvastatin (LIPITOR) 40 MG tablet Take 40 mg by mouth at bedtime.    . Calcium Carbonate-Vitamin D 600-400 MG-UNIT per tablet Take 1 tablet by mouth 2 (two) times daily.    . carvedilol (COREG) 3.125 MG tablet Take 3.125 mg by mouth 2 (two) times daily.     . clonazePAM (KLONOPIN) 1 MG tablet Take 1 mg by mouth at bedtime.     Marland Kitchen glimepiride (AMARYL) 4 MG tablet Take 4 mg by mouth 2 (two) times daily.    . halobetasol (ULTRAVATE) 0.05 % cream Apply 1 application topically 2 (two) times daily as needed (rash on leg). Applied to rash on leg (ankle area)    . Insulin Detemir (LEVEMIR) 100 UNIT/ML Pen Inject 17-25 Units into the skin 2 (two) times daily. 25 units in the morning & 17 units at night    . JANUVIA 100 MG tablet Take 100 mg by mouth at bedtime.     . levETIRAcetam (KEPPRA) 500 MG tablet Take 1 tablet (500 mg total) by mouth 2 (two)  times daily. 60 tablet 12  . losartan (COZAAR) 50 MG tablet Take 50 mg by mouth at bedtime.     . metFORMIN (GLUCOPHAGE-XR) 500 MG 24 hr tablet Take 500 mg by mouth 2 (two) times daily.    . Multiple Vitamin (MULTIVITAMIN WITH MINERALS) TABS tablet Take 1 tablet by mouth at bedtime.    . Omega-3 Fatty Acids (FISH OIL) 1200 MG CAPS Take 2,400 mg by mouth 2 (two) times daily.    Marland Kitchen omeprazole (PRILOSEC) 20 MG capsule Take 20 mg by mouth at bedtime.     Glory Rosebush DELICA LANCETS 79K MISC 1 each by Other route 3 (three) times daily.     Glory Rosebush VERIO test strip 1 each by Other route 3 (three) times daily.     . pioglitazone (ACTOS) 30 MG tablet Take 30 mg by mouth at bedtime.    Vladimir Faster Glycol-Propyl Glycol (SYSTANE) 0.4-0.3 % SOLN Place 1 drop into both eyes 2 (two) times daily as needed (dry eyes).     . risedronate (ACTONEL) 150 MG tablet Take 150 mg by mouth every 30 (thirty) days.    Marland Kitchen sertraline (ZOLOFT) 100 MG tablet Take 100 mg by mouth at bedtime.    . nitroGLYCERIN (NITROSTAT) 0.4 MG SL tablet Place 1 tablet (0.4 mg total) under the tongue every 5 (five) minutes as needed for chest pain. 25 tablet 1   No facility-administered medications prior to visit.     PAST MEDICAL HISTORY: Past Medical History:  Diagnosis Date  . CAD (coronary artery disease)    Cypher stent 09/2002  . CHF (congestive heart failure) ([place])   . Diabetes mellitus ([place])   . Hyperlipidemia   . Hypertension   . Myocardial infarction ([place])    2003,went into cardiac shock  . Osteoporosis   . Seizures ([place])    1st seizure age late 27's; most recent 07/28/17  . Sinusitis     PAST SURGICAL HISTORY: Past Surgical History:  Procedure Laterality Date  . ABDOMINAL HYSTERECTOMY    . APPENDECTOMY    . BREAST LUMPECTOMY WITH RADIOACTIVE SEED AND SENTINEL LYMPH NODE BIOPSY Left 03/16/2017   Procedure: INJECT BLUE DYE LEFT BREAST, LEFT BREAST LUMPECTOMY WITH RADIOACTIVE SEED AND LEFT AXILLARY DEEP SENTINEL LYMPH NODE  BIOPSY ADJACENT TISSUE TRANSFER;  Surgeon: [name], MD;  Location: [facility];  Service: General;  Laterality: Left;  . CARPAL TUNNEL RELEASE Bilateral   . COLONOSCOPY WITH PROPOFOL N/A 05/30/2015   Procedure: COLONOSCOPY WITH PROPOFOL;  Surgeon: Carol Ada, MD;  Location: [facility];  Service: Endoscopy;  Laterality: N/A;  . CORONARY ANGIOPLASTY WITH STENT PLACEMENT  10-02-2002  . Exploratory abdominal     Bleeding in abdomen after tubal  . TUBAL LIGATION    . UMBILICAL HERNIA REPAIR      FAMILY HISTORY: Family History  Problem Relation Age of Onset  . Dementia Mother   . Kidney disease Mother   . Heart attack Father 51       Died age 32  . Heart attack  Brother 51  . Heart attack Brother 38    SOCIAL HISTORY:  Social History   Socioeconomic History  . Marital status: Married    Spouse name: Sonia Side  . Number of children: 3  . Years of education: 68  . Highest education level: Not on file  Social Needs  . Financial resource strain: Not on file  . Food insecurity - worry: Not on file  . Food insecurity -  inability: Not on file  . Transportation needs - medical: Not on file  . Transportation needs - non-medical: Not on file  Occupational History  . Not on file  Tobacco Use  . Smoking status: Current Every Day Smoker    Packs/day: 2.00    Years: 30.00    Pack years: 60.00    Types: Cigarettes    Last attempt to quit: 03/09/2017    Years since quitting: 0.5  . Smokeless tobacco: Never Used  . Tobacco comment: e cig, 08/02/17 1 PPD  Substance and Sexual Activity  . Alcohol use: No    Alcohol/week: 0.0 oz  . Drug use: No  . Sexual activity: Not on file  Other Topics Concern  . Not on file  Social History Narrative   Lives with husband.    Caffeine-      PHYSICAL EXAM  GENERAL EXAM/CONSTITUTIONAL: Vitals:  Vitals:   10/05/17 1501  BP: 116/71  Pulse: 72  Weight: 122 lb 9.6 oz (55.6 kg)  Height: 5\' 5"  (1.651 m)   Body mass index is 20.4 kg/m. No exam data present  Patient is in no distress; well developed, nourished and groomed; neck is supple  CARDIOVASCULAR:  Examination of carotid arteries is normal; no carotid bruits  Regular rate and rhythm, no murmurs  Examination of peripheral vascular system by observation and palpation is normal  EYES:  Ophthalmoscopic exam of optic discs and posterior segments is normal; no papilledema or hemorrhages  MUSCULOSKELETAL:  Gait, strength, tone, movements noted in Neurologic exam below  NEUROLOGIC: MENTAL STATUS:  No flowsheet data found.  awake, alert, oriented to person, place and time  recent and remote memory intact  normal attention and  concentration  language fluent, comprehension intact, naming intact,   fund of knowledge appropriate  CRANIAL NERVE:   2nd - no papilledema on fundoscopic exam  2nd, 3rd, 4th, 6th - pupils equal and reactive to light, visual fields full to confrontation, extraocular muscles intact, no nystagmus  5th - facial sensation symmetric  7th - facial strength symmetric  8th - hearing intact  9th - palate elevates symmetrically, uvula midline  11th - shoulder shrug symmetric  12th - tongue protrusion midline  MOTOR:   normal bulk and tone, full strength in the BUE, BLE  SENSORY:   normal and symmetric to light touch, temperature, vibration  COORDINATION:   finger-nose-finger, fine finger movements normal  REFLEXES:   deep tendon reflexes present and symmetric  GAIT/STATION:   narrow based gait; able to walk tandem    DIAGNOSTIC DATA (LABS, IMAGING, TESTING) - I reviewed patient records, labs, notes, testing and imaging myself where available.  Lab Results  Component Value Date   WBC 5.4 08/25/2017   HGB 14.8 08/25/2017   HCT 44.4 08/25/2017   MCV 81.3 08/25/2017   PLT 95 (L) 08/25/2017      Component Value Date/Time   NA 139 08/25/2017 1224   K 4.3 08/25/2017 1224   CL 106 05/24/2017 1722   CO2 26 08/25/2017 1224   GLUCOSE 149 (H) 08/25/2017 1224   BUN 10.1 08/25/2017 1224   CREATININE 0.7 08/25/2017 1224   CALCIUM 9.4 08/25/2017 1224   PROT 7.2 08/25/2017 1224   ALBUMIN 4.0 08/25/2017 1224   AST 24 08/25/2017 1224   ALT 33 08/25/2017 1224   ALKPHOS 72 08/25/2017 1224   BILITOT 0.49 08/25/2017 1224   GFRNONAA >60 05/24/2017 1722   GFRAA >60 05/24/2017 1722   No results found for:  CHOL, HDL, LDLCALC, LDLDIRECT, TRIG, CHOLHDL Lab Results  Component Value Date   HGBA1C 8.5 (H) 03/09/2017   No results found for: VITAMINB12 No results found for: TSH   05/24/17 CT head [I reviewed images myself and agree with interpretation. -VRP]  - No acute  intracranial abnormality noted on this unenhanced exam.  08/05/17 MRI brain [I reviewed images myself and agree with interpretation. -VRP]  1.    Mild chronic microvascular ischemic changes, left greater than right. 2.    There are no acute findings and there is a normal enhancement pattern.  08/15/17 EEG - normal     ASSESSMENT AND PLAN  71 y.o. year old female here with 3 seizures in life.  One seizure may have been associated with hypoglycemia.  The other 2 seizures were not associated with any triggering factors.  Last seizure 07/28/17.   Ddx: seizure (3 in life; ? Hypoglycemia vs other secondary cause)  1. Seizures ([place])   2. Malignant neoplasm of upper-inner quadrant of left breast in female, estrogen receptor positive ([place])      PLAN:  I spent 15 minutes of face to face time with patient. Greater than 50% of time was spent in counseling and coordination of care with patient. In summary we discussed:   - continue levetiracetam 500mg  twice a day   - According to [place] law, you can not drive unless you are seizure / syncope free for at least 6 months and under physician's care.   - Please maintain precautions. Do not participate in activities where a loss of awareness could harm you or someone else. No swimming alone, no tub bathing, no hot tubs, no driving, no operating motorized vehicles (cars, ATVs, motocycles, etc), lawnmowers, power tools or firearms. No standing at heights, such as rooftops, ladders or stairs. Avoid hot objects such as stoves, heaters, open fires. Wear a helmet when riding a bicycle, scooter, skateboard, etc. and avoid areas of traffic. Set your water heater to 120 degrees or less.  Meds ordered this encounter  Medications  . levETIRAcetam (KEPPRA) 500 MG tablet    Sig: Take 1 tablet (500 mg total) by mouth 2 (two) times daily.    Dispense:  180 tablet    Refill:  4   Return in about 6 months (around 04/05/2018) for with NP.    Penni Bombard,  MD 61/95/0932, 6:71 PM Certified in Neurology, Neurophysiology and Neuroimaging  [facility] [address], [place],  [address] [phone]

## 2017-10-05 NOTE — Patient Instructions (Addendum)
-   continue levetiracetam 500mg  twice a day   - According to [place] law, you can not drive unless you are seizure / syncope free for at least 6 months and under physician's care. Last seizure 07/28/17.   - Please maintain precautions. Do not participate in activities where a loss of awareness could harm you or someone else. No swimming alone, no tub bathing, no hot tubs, no driving, no operating motorized vehicles (cars, ATVs, motocycles, etc), lawnmowers, power tools or firearms. No standing at heights, such as rooftops, ladders or stairs. Avoid hot objects such as stoves, heaters, open fires. Wear a helmet when riding a bicycle, scooter, skateboard, etc. and avoid areas of traffic. Set your water heater to 120 degrees or less.

## 2017-11-25 ENCOUNTER — Emergency Department (HOSPITAL_COMMUNITY): Payer: Medicare Other

## 2017-11-25 ENCOUNTER — Other Ambulatory Visit: Payer: Self-pay

## 2017-11-25 ENCOUNTER — Observation Stay (HOSPITAL_COMMUNITY)
Admission: EM | Admit: 2017-11-25 | Discharge: 2017-11-27 | Disposition: A | Discharge status: Home / Self Care | Payer: Medicare Other | Attending: Family Medicine | Admitting: Family Medicine

## 2017-11-25 ENCOUNTER — Encounter (HOSPITAL_COMMUNITY): Payer: Self-pay | Admitting: *Deleted

## 2017-11-25 DIAGNOSIS — J111 Influenza due to unidentified influenza virus with other respiratory manifestations: Secondary | ICD-10-CM | POA: Diagnosis not present

## 2017-11-25 DIAGNOSIS — Z888 Allergy status to other drugs, medicaments and biological substances status: Secondary | ICD-10-CM | POA: Insufficient documentation

## 2017-11-25 DIAGNOSIS — F419 Anxiety disorder, unspecified: Secondary | ICD-10-CM | POA: Insufficient documentation

## 2017-11-25 DIAGNOSIS — Z955 Presence of coronary angioplasty implant and graft: Secondary | ICD-10-CM | POA: Diagnosis not present

## 2017-11-25 DIAGNOSIS — I2510 Atherosclerotic heart disease of native coronary artery without angina pectoris: Secondary | ICD-10-CM | POA: Insufficient documentation

## 2017-11-25 DIAGNOSIS — Z7982 Long term (current) use of aspirin: Secondary | ICD-10-CM | POA: Diagnosis not present

## 2017-11-25 DIAGNOSIS — F17210 Nicotine dependence, cigarettes, uncomplicated: Secondary | ICD-10-CM | POA: Diagnosis not present

## 2017-11-25 DIAGNOSIS — Z794 Long term (current) use of insulin: Secondary | ICD-10-CM | POA: Insufficient documentation

## 2017-11-25 DIAGNOSIS — R0902 Hypoxemia: Secondary | ICD-10-CM | POA: Diagnosis present

## 2017-11-25 DIAGNOSIS — Z79899 Other long term (current) drug therapy: Secondary | ICD-10-CM | POA: Insufficient documentation

## 2017-11-25 DIAGNOSIS — I252 Old myocardial infarction: Secondary | ICD-10-CM | POA: Diagnosis not present

## 2017-11-25 DIAGNOSIS — E785 Hyperlipidemia, unspecified: Secondary | ICD-10-CM | POA: Insufficient documentation

## 2017-11-25 DIAGNOSIS — C50212 Malignant neoplasm of upper-inner quadrant of left female breast: Secondary | ICD-10-CM

## 2017-11-25 DIAGNOSIS — G40909 Epilepsy, unspecified, not intractable, without status epilepticus: Secondary | ICD-10-CM | POA: Diagnosis not present

## 2017-11-25 DIAGNOSIS — R69 Illness, unspecified: Secondary | ICD-10-CM

## 2017-11-25 DIAGNOSIS — Z170 Estrogen receptor positive status [ER+]: Secondary | ICD-10-CM

## 2017-11-25 DIAGNOSIS — J9601 Acute respiratory failure with hypoxia: Secondary | ICD-10-CM | POA: Diagnosis present

## 2017-11-25 DIAGNOSIS — I110 Hypertensive heart disease with heart failure: Secondary | ICD-10-CM | POA: Diagnosis not present

## 2017-11-25 DIAGNOSIS — M810 Age-related osteoporosis without current pathological fracture: Secondary | ICD-10-CM | POA: Insufficient documentation

## 2017-11-25 DIAGNOSIS — Z853 Personal history of malignant neoplasm of breast: Secondary | ICD-10-CM | POA: Insufficient documentation

## 2017-11-25 DIAGNOSIS — J101 Influenza due to other identified influenza virus with other respiratory manifestations: Secondary | ICD-10-CM

## 2017-11-25 DIAGNOSIS — I509 Heart failure, unspecified: Secondary | ICD-10-CM | POA: Insufficient documentation

## 2017-11-25 DIAGNOSIS — Z720 Tobacco use: Secondary | ICD-10-CM | POA: Diagnosis present

## 2017-11-25 DIAGNOSIS — Z881 Allergy status to other antibiotic agents status: Secondary | ICD-10-CM | POA: Insufficient documentation

## 2017-11-25 DIAGNOSIS — Z882 Allergy status to sulfonamides status: Secondary | ICD-10-CM | POA: Insufficient documentation

## 2017-11-25 DIAGNOSIS — E119 Type 2 diabetes mellitus without complications: Secondary | ICD-10-CM | POA: Diagnosis not present

## 2017-11-25 DIAGNOSIS — E118 Type 2 diabetes mellitus with unspecified complications: Secondary | ICD-10-CM

## 2017-11-25 LAB — CBC WITH DIFFERENTIAL/PLATELET
Basophils Absolute: 0 10*3/uL (ref 0.0–0.1)
Basophils Relative: 0 %
Eosinophils Absolute: 0 10*3/uL (ref 0.0–0.7)
Eosinophils Relative: 0 %
HEMATOCRIT: 45.7 % (ref 36.0–46.0)
Hemoglobin: 15.2 g/dL — ABNORMAL HIGH (ref 12.0–15.0)
Lymphocytes Relative: 5 %
Lymphs Abs: 0.5 10*3/uL — ABNORMAL LOW (ref 0.7–4.0)
MCH: 26.5 pg (ref 26.0–34.0)
MCHC: 33.3 g/dL (ref 30.0–36.0)
MCV: 79.6 fL (ref 78.0–100.0)
MONO ABS: 0.4 10*3/uL (ref 0.1–1.0)
MONOS PCT: 4 %
NEUTROS ABS: 8.3 10*3/uL — AB (ref 1.7–7.7)
Neutrophils Relative %: 91 %
Platelets: 107 10*3/uL — ABNORMAL LOW (ref 150–400)
RBC: 5.74 MIL/uL — ABNORMAL HIGH (ref 3.87–5.11)
RDW: 15.1 % (ref 11.5–15.5)
WBC: 9.2 10*3/uL (ref 4.0–10.5)

## 2017-11-25 LAB — COMPREHENSIVE METABOLIC PANEL
ALT: 33 U/L (ref 14–54)
ANION GAP: 14 (ref 5–15)
AST: 43 U/L — ABNORMAL HIGH (ref 15–41)
Albumin: 4.3 g/dL (ref 3.5–5.0)
Alkaline Phosphatase: 53 U/L (ref 38–126)
BILIRUBIN TOTAL: 1.6 mg/dL — AB (ref 0.3–1.2)
BUN: 10 mg/dL (ref 6–20)
CO2: 24 mmol/L (ref 22–32)
Calcium: 9.4 mg/dL (ref 8.9–10.3)
Chloride: 98 mmol/L — ABNORMAL LOW (ref 101–111)
Creatinine, Ser: 0.7 mg/dL (ref 0.44–1.00)
GFR calc Af Amer: >60 mL/min (ref >60)
GFR calc non Af Amer: >60 mL/min (ref >60)
Glucose, Bld: 147 mg/dL — ABNORMAL HIGH (ref 65–99)
POTASSIUM: 4 mmol/L (ref 3.5–5.1)
Sodium: 136 mmol/L (ref 135–145)
TOTAL PROTEIN: 7.5 g/dL (ref 6.5–8.1)

## 2017-11-25 LAB — INFLUENZA PANEL BY PCR (TYPE A & B)
INFLAPCR: POSITIVE — AB
Influenza B By PCR: NEGATIVE

## 2017-11-25 MED ORDER — NITROGLYCERIN 0.4 MG SL SUBL: 0.4000 mg | SUBLINGUAL_TABLET | SUBLINGUAL | Status: DC | PRN

## 2017-11-25 MED ORDER — LINAGLIPTIN 5 MG PO TABS
5.0000 mg | ORAL_TABLET | Freq: Every day | ORAL | Status: DC
  Administered 2017-11-26 – 2017-11-27 (×2): 5 mg via ORAL
  Filled 2017-11-25 (×2): qty 1

## 2017-11-25 MED ORDER — INSULIN ASPART 100 UNIT/ML ~~LOC~~ SOLN: 0.0000 [IU] | Freq: Three times a day (TID) | SUBCUTANEOUS | Status: DC

## 2017-11-25 MED ORDER — CANAGLIFLOZIN 100 MG PO TABS
100.0000 mg | ORAL_TABLET | Freq: Every day | ORAL | Status: DC
  Administered 2017-11-26 – 2017-11-27 (×2): 100 mg via ORAL
  Filled 2017-11-25 (×2): qty 1

## 2017-11-25 MED ORDER — SERTRALINE HCL 100 MG PO TABS
100.0000 mg | ORAL_TABLET | Freq: Every day | ORAL | Status: DC
  Administered 2017-11-26: 100 mg via ORAL
  Filled 2017-11-25 (×2): qty 1

## 2017-11-25 MED ORDER — GUAIFENESIN 100 MG/5ML PO SOLN
5.0000 mL | Freq: Once | ORAL | Status: AC
  Administered 2017-11-25: 100 mg via ORAL
  Filled 2017-11-25 (×2): qty 5

## 2017-11-25 MED ORDER — ONDANSETRON HCL 4 MG/2ML IJ SOLN
4.0000 mg | Freq: Once | INTRAMUSCULAR | Status: AC
  Administered 2017-11-25: 4 mg via INTRAVENOUS
  Filled 2017-11-25: qty 2

## 2017-11-25 MED ORDER — ENOXAPARIN SODIUM 40 MG/0.4ML ~~LOC~~ SOLN
40.0000 mg | Freq: Every day | SUBCUTANEOUS | Status: DC
  Administered 2017-11-26 – 2017-11-27 (×2): 40 mg via SUBCUTANEOUS
  Filled 2017-11-25 (×2): qty 0.4

## 2017-11-25 MED ORDER — PIOGLITAZONE HCL 30 MG PO TABS
30.0000 mg | ORAL_TABLET | Freq: Every day | ORAL | Status: DC
  Administered 2017-11-26: 30 mg via ORAL
  Filled 2017-11-25 (×3): qty 1

## 2017-11-25 MED ORDER — OMEGA-3-ACID ETHYL ESTERS 1 G PO CAPS
1.0000 g | ORAL_CAPSULE | Freq: Every day | ORAL | Status: DC
  Administered 2017-11-26 – 2017-11-27 (×2): 1 g via ORAL
  Filled 2017-11-25 (×2): qty 1

## 2017-11-25 MED ORDER — SODIUM CHLORIDE 0.9 % IV BOLUS (SEPSIS)
1000.0000 mL | Freq: Once | INTRAVENOUS | Status: AC
  Administered 2017-11-25: 1000 mL via INTRAVENOUS

## 2017-11-25 MED ORDER — ANASTROZOLE 1 MG PO TABS
1.0000 mg | ORAL_TABLET | Freq: Every day | ORAL | Status: DC
  Administered 2017-11-26 – 2017-11-27 (×2): 1 mg via ORAL
  Filled 2017-11-25 (×3): qty 1

## 2017-11-25 MED ORDER — ACETAMINOPHEN 650 MG RE SUPP: 650.0000 mg | Freq: Four times a day (QID) | RECTAL | Status: DC | PRN

## 2017-11-25 MED ORDER — ACETAMINOPHEN 325 MG PO TABS
650.0000 mg | ORAL_TABLET | Freq: Once | ORAL | Status: AC
  Administered 2017-11-25: 650 mg via ORAL
  Filled 2017-11-25: qty 2

## 2017-11-25 MED ORDER — ONDANSETRON HCL 4 MG PO TABS: 4.0000 mg | ORAL_TABLET | Freq: Four times a day (QID) | ORAL | Status: DC | PRN

## 2017-11-25 MED ORDER — CALCIUM CARBONATE-VITAMIN D 500-200 MG-UNIT PO TABS
1.0000 | ORAL_TABLET | Freq: Two times a day (BID) | ORAL | Status: DC
  Administered 2017-11-26 – 2017-11-27 (×3): 1 via ORAL
  Filled 2017-11-25 (×3): qty 1

## 2017-11-25 MED ORDER — ADULT MULTIVITAMIN W/MINERALS CH
1.0000 | ORAL_TABLET | Freq: Every day | ORAL | Status: DC
  Administered 2017-11-26: 1 via ORAL
  Filled 2017-11-25 (×2): qty 1

## 2017-11-25 MED ORDER — LOSARTAN POTASSIUM 50 MG PO TABS
50.0000 mg | ORAL_TABLET | Freq: Every day | ORAL | Status: DC
  Administered 2017-11-26: 50 mg via ORAL
  Filled 2017-11-25: qty 1

## 2017-11-25 MED ORDER — POLYVINYL ALCOHOL 1.4 % OP SOLN: 1.0000 [drp] | Freq: Two times a day (BID) | OPHTHALMIC | Status: DC | PRN

## 2017-11-25 MED ORDER — ATORVASTATIN CALCIUM 40 MG PO TABS
40.0000 mg | ORAL_TABLET | Freq: Every day | ORAL | Status: DC
  Administered 2017-11-26 (×2): 40 mg via ORAL
  Filled 2017-11-25 (×2): qty 1

## 2017-11-25 MED ORDER — INSULIN DETEMIR 100 UNIT/ML ~~LOC~~ SOLN
20.0000 [IU] | Freq: Every day | SUBCUTANEOUS | Status: DC
  Administered 2017-11-26 – 2017-11-27 (×2): 20 [IU] via SUBCUTANEOUS
  Filled 2017-11-25 (×2): qty 0.2

## 2017-11-25 MED ORDER — ACETAMINOPHEN 325 MG PO TABS
650.0000 mg | ORAL_TABLET | Freq: Four times a day (QID) | ORAL | Status: DC | PRN
  Administered 2017-11-26: 650 mg via ORAL

## 2017-11-25 MED ORDER — CARVEDILOL 3.125 MG PO TABS
3.1250 mg | ORAL_TABLET | Freq: Two times a day (BID) | ORAL | Status: DC
  Administered 2017-11-26 – 2017-11-27 (×3): 3.125 mg via ORAL
  Filled 2017-11-25 (×2): qty 1

## 2017-11-25 MED ORDER — OSELTAMIVIR PHOSPHATE 75 MG PO CAPS
75.0000 mg | ORAL_CAPSULE | Freq: Once | ORAL | Status: AC
  Administered 2017-11-25: 75 mg via ORAL
  Filled 2017-11-25: qty 1

## 2017-11-25 MED ORDER — ASPIRIN EC 81 MG PO TBEC
81.0000 mg | DELAYED_RELEASE_TABLET | Freq: Every day | ORAL | Status: DC
  Administered 2017-11-26: 81 mg via ORAL
  Filled 2017-11-25 (×2): qty 1

## 2017-11-25 MED ORDER — LEVETIRACETAM 500 MG PO TABS
500.0000 mg | ORAL_TABLET | Freq: Two times a day (BID) | ORAL | Status: DC
  Administered 2017-11-26 – 2017-11-27 (×4): 500 mg via ORAL
  Filled 2017-11-25 (×4): qty 1

## 2017-11-25 MED ORDER — CLONAZEPAM 0.5 MG PO TABS
1.0000 mg | ORAL_TABLET | Freq: Every day | ORAL | Status: DC
  Administered 2017-11-26 (×2): 1 mg via ORAL
  Filled 2017-11-25 (×2): qty 2

## 2017-11-25 MED ORDER — ONDANSETRON HCL 4 MG/2ML IJ SOLN: 4.0000 mg | Freq: Four times a day (QID) | INTRAMUSCULAR | Status: DC | PRN

## 2017-11-25 MED ORDER — INSULIN DETEMIR 100 UNIT/ML ~~LOC~~ SOLN
10.0000 [IU] | Freq: Every day | SUBCUTANEOUS | Status: DC
  Administered 2017-11-26 (×2): 10 [IU] via SUBCUTANEOUS
  Filled 2017-11-25 (×3): qty 0.1

## 2017-11-25 MED ORDER — PANTOPRAZOLE SODIUM 40 MG PO TBEC
40.0000 mg | DELAYED_RELEASE_TABLET | Freq: Every day | ORAL | Status: DC
  Administered 2017-11-26 – 2017-11-27 (×2): 40 mg via ORAL
  Filled 2017-11-25 (×2): qty 1

## 2017-11-25 NOTE — ED Notes (Signed)
ED Provider at bedside. 

## 2017-11-25 NOTE — H&P (Signed)
History and Physical    Monica [name] UXL:[id number] DOB: Dec 07, 1945 DOA: 11/25/2017  PCP: Jani Gravel, MD  Patient coming from: Home.  Chief Complaint: Shortness of breath cough fever chills.  HPI: Monica [name] is a 72 y.o. female with history of CAD status post stenting, diabetes mellitus type 2, tobacco abuse, hyperlipidemia presents to the ER because of fever chills productive cough over the last 24 hours.  Patient states her son was diagnosed with flu a 24 hours ago.  Denies any chest pain.  Has been a nonproductive cough persistent.  ED Course: In the ER patient was diagnosed with influenza A.  Chest x-ray was unremarkable.  Patient remained hypoxic and has been admitted for further management of acute respiratory failure likely from influenza.  Patient was started on Tamiflu.  On exam patient is not wheezing.  Review of Systems: As per HPI, rest all negative.   Past Medical History:  Diagnosis Date  . CAD (coronary artery disease)    Cypher stent 09/2002  . CHF (congestive heart failure) ([place])   . Diabetes mellitus ([place])   . Hyperlipidemia   . Hypertension   . Myocardial infarction ([place])    2003,went into cardiac shock  . Osteoporosis   . Seizures ([place])    1st seizure age late 34's; most recent 07/28/17  . Sinusitis     Past Surgical History:  Procedure Laterality Date  . ABDOMINAL HYSTERECTOMY    . APPENDECTOMY    . BREAST LUMPECTOMY WITH RADIOACTIVE SEED AND SENTINEL LYMPH NODE BIOPSY Left 03/16/2017   Procedure: INJECT BLUE DYE LEFT BREAST, LEFT BREAST LUMPECTOMY WITH RADIOACTIVE SEED AND LEFT AXILLARY DEEP SENTINEL LYMPH NODE  BIOPSY ADJACENT TISSUE TRANSFER;  Surgeon: [name], MD;  Location: [facility];  Service: General;  Laterality: Left;  . CARPAL TUNNEL RELEASE Bilateral   . COLONOSCOPY WITH PROPOFOL N/A 05/30/2015   Procedure: COLONOSCOPY WITH PROPOFOL;  Surgeon: Carol Ada, MD;  [facility];  Service: Endoscopy;  Laterality: N/A;  .  CORONARY ANGIOPLASTY WITH STENT PLACEMENT  10-02-2002  . Exploratory abdominal     Bleeding in abdomen after tubal  . TUBAL LIGATION    . UMBILICAL HERNIA REPAIR       reports that she has been smoking cigarettes.  She has a 60.00 pack-year smoking history. she has never used smokeless tobacco. She reports that she does not drink alcohol or use drugs.  Allergies  Allergen Reactions  . Ace Inhibitors Dermatitis and Cough  . Keflex [Cephalexin] Itching  . Phenobarbital Other (See Comments)    Black spots on hands and feet.  . Sulfate Nausea And Vomiting and Other (See Comments)    Dizziness   . Altace [Ramipril] Other (See Comments)    UNSPECIFIED REACTION      Family History  Problem Relation Age of Onset  . Dementia Mother   . Kidney disease Mother   . Heart attack Father 51       Died age 62  . Heart attack Brother 15  . Heart attack Brother 67    Prior to Admission medications   Medication Sig Start Date End Date Taking? Authorizing Provider  acetaminophen (TYLENOL) 500 MG tablet Take 1,000 mg by mouth every 6 (six) hours as needed for moderate pain or headache.   Yes [provider]  anastrozole (ARIMIDEX) 1 MG tablet Take 1 tablet (1 mg total) daily by mouth. 08/25/17  Yes Truitt Merle, MD  aspirin EC 81 MG tablet Take  81 mg by mouth at bedtime.   Yes [provider]  atorvastatin (LIPITOR) 40 MG tablet Take 40 mg by mouth at bedtime. 12/07/16  Yes [provider]  Calcium Carbonate-Vitamin D 600-400 MG-UNIT per tablet Take 1 tablet by mouth 2 (two) times daily.   Yes [provider]  carvedilol (COREG) 3.125 MG tablet Take 3.125 mg by mouth 2 (two) times daily.    Yes [provider]  clonazePAM (KLONOPIN) 1 MG tablet Take 1 mg by mouth at bedtime.  03/02/16  Yes [provider]  glimepiride (AMARYL) 4 MG tablet Take 4 mg by mouth 2 (two) times daily. 03/03/16  Yes [provider]  halobetasol (ULTRAVATE) 0.05 %  cream Apply 1 application topically 2 (two) times daily as needed (rash on leg). Applied to rash on leg (ankle area)   Yes [provider]  Insulin Detemir (LEVEMIR) 100 UNIT/ML Pen Inject 17-25 Units into the skin 2 (two) times daily. 25 units in the morning & 17 units at night   Yes [provider]  JANUVIA 100 MG tablet Take 100 mg by mouth at bedtime.  02/02/17  Yes [provider]  JARDIANCE 10 MG TABS tablet Take 10 mg by mouth daily. 11/11/17  Yes [provider]  levETIRAcetam (KEPPRA) 500 MG tablet Take 1 tablet (500 mg total) by mouth 2 (two) times daily. 10/05/17  Yes Penumalli, Earlean Polka, MD  losartan (COZAAR) 50 MG tablet Take 50 mg by mouth at bedtime.    Yes [provider]  metFORMIN (GLUCOPHAGE-XR) 500 MG 24 hr tablet Take 500 mg by mouth 2 (two) times daily. 02/02/17  Yes [provider]  Multiple Vitamin (MULTIVITAMIN WITH MINERALS) TABS tablet Take 1 tablet by mouth at bedtime.   Yes [provider]  nitroGLYCERIN (NITROSTAT) 0.4 MG SL tablet Place 1 tablet (0.4 mg total) under the tongue every 5 (five) minutes as needed for chest pain. 03/21/17 11/25/17 Yes Minus Breeding, MD  Omega-3 Fatty Acids (FISH OIL) 1200 MG CAPS Take 2,400 mg by mouth 2 (two) times daily.   Yes [provider]  omeprazole (PRILOSEC) 20 MG capsule Take 20 mg by mouth at bedtime.    Yes [provider]  pioglitazone (ACTOS) 30 MG tablet Take 30 mg by mouth at bedtime. 02/23/17  Yes [provider]  Polyethyl Glycol-Propyl Glycol (SYSTANE) 0.4-0.3 % SOLN Place 1 drop into both eyes 2 (two) times daily as needed (dry eyes).    Yes [provider]  risedronate (ACTONEL) 150 MG tablet Take 150 mg by mouth every 30 (thirty) days. 03/02/16  Yes [provider]  sertraline (ZOLOFT) 100 MG tablet Take 100 mg by mouth at bedtime. 02/02/17  Yes [provider]    Physical Exam: Vitals:   11/25/17 2022  11/25/17 2100 11/25/17 2118 11/25/17 2200  BP: (!) 105/46 (!) 114/55  (!) 116/56  Pulse: 82 88  90  Resp: (!) 22 (!) 26  (!) 25  Temp: 100.2 F (37.9 C)     TempSrc: Axillary     SpO2: 94% 94% 94% 92%  Weight:      Height:          Constitutional: Moderately built and nourished. Vitals:   11/25/17 2022 11/25/17 2100 11/25/17 2118 11/25/17 2200  BP: (!) 105/46 (!) 114/55  (!) 116/56  Pulse: 82 88  90  Resp: (!) 22 (!) 26  (!) 25  Temp: 100.2 F (37.9 C)  TempSrc: Axillary     SpO2: 94% 94% 94% 92%  Weight:      Height:       Eyes: Anicteric no pallor. ENMT: No discharge from the ears eyes nose or mouth. Neck: No mass felt but no JVD appreciated. Respiratory: No rhonchi or crepitations. Cardiovascular: S1-S2 heard no murmurs appreciated. Abdomen: Soft nontender bowel sounds present.  No guarding or rigidity. Musculoskeletal: No edema.  No joint effusion. Skin: No rash.  Skin appears warm. Neurologic: Alert awake oriented to time place and person.  Moves all extremities. Psychiatric: Appears normal.  Normal affect.   Labs on Admission: I have personally reviewed following labs and imaging studies  CBC: Recent Labs  Lab 11/25/17 1729  WBC 9.2  NEUTROABS 8.3*  HGB 15.2*  HCT 45.7  MCV 79.6  PLT 094*   Basic Metabolic Panel: Recent Labs  Lab 11/25/17 1729  NA 136  K 4.0  CL 98*  CO2 24  GLUCOSE 147*  BUN 10  CREATININE 0.70  CALCIUM 9.4   GFR: Estimated Creatinine Clearance: 55.4 mL/min (by C-G formula based on SCr of 0.7 mg/dL). Liver Function Tests: Recent Labs  Lab 11/25/17 1729  AST 43*  ALT 33  ALKPHOS 53  BILITOT 1.6*  PROT 7.5  ALBUMIN 4.3   No results for input(s): LIPASE, AMYLASE in the last 168 hours. No results for input(s): AMMONIA in the last 168 hours. Coagulation Profile: No results for input(s): INR, PROTIME in the last 168 hours. Cardiac Enzymes: No results for input(s): CKTOTAL, CKMB, CKMBINDEX, TROPONINI in the last  168 hours. BNP (last 3 results) No results for input(s): PROBNP in the last 8760 hours. HbA1C: No results for input(s): HGBA1C in the last 72 hours. CBG: No results for input(s): GLUCAP in the last 168 hours. Lipid Profile: No results for input(s): CHOL, HDL, LDLCALC, TRIG, CHOLHDL, LDLDIRECT in the last 72 hours. Thyroid Function Tests: No results for input(s): TSH, T4TOTAL, FREET4, T3FREE, THYROIDAB in the last 72 hours. Anemia Panel: No results for input(s): VITAMINB12, FOLATE, FERRITIN, TIBC, IRON, RETICCTPCT in the last 72 hours. Urine analysis:    Component Value Date/Time   COLORURINE YELLOW 07/09/2016 [facility] 07/09/2016 0549   LABSPEC 1.009 07/09/2016 0549   PHURINE 6.0 07/09/2016 0549   GLUCOSEU 100 (A) 07/09/2016 0549   HGBUR NEGATIVE 07/09/2016 0549   BILIRUBINUR NEGATIVE 07/09/2016 0549   KETONESUR NEGATIVE 07/09/2016 0549   PROTEINUR NEGATIVE 07/09/2016 0549   NITRITE NEGATIVE 07/09/2016 0549   LEUKOCYTESUR SMALL (A) 07/09/2016 0549   Sepsis Labs: @LABRCNTIP (procalcitonin:4,lacticidven:4) )No results found for this or any previous visit (from the past 240 hour(s)).   Radiological Exams on Admission: Dg Chest 2 View  Result Date: 11/25/2017 CLINICAL DATA:  Cough and fever. EXAM: CHEST  2 VIEW COMPARISON:  July 09, 2016 FINDINGS: The heart size and mediastinal contours are within normal limits. Both lungs are clear. The visualized skeletal structures are unremarkable. IMPRESSION: No active cardiopulmonary disease. Electronically Signed   By: Dorise Bullion III M.D   On: 11/25/2017 18:40    Assessment/Plan Principal Problem:   Acute respiratory failure with hypoxia ([place]) Active Problems:   CAD (coronary artery disease)   Malignant neoplasm of upper-inner quadrant of left breast in female, estrogen receptor positive ([place])   Dyslipidemia   Tobacco abuse   Type 2 diabetes mellitus with complication, with long-term current use of insulin  ([facility])   Influenza A    1. Acute respiratory failure with hypoxia  likely from influenza A -patient has been placed on Tamiflu.  Will check troponin and BNP.  Patient does not look fluid overloaded chest x-ray does not show any infiltrates.  Patient is also not wheezing. 2. CAD status post stenting last stress test in 2016 was unremarkable -continue on aspirin statins beta-blockers. 3. Hypertension on Cozaar and beta-blocker. 4. Tobacco abuse -patient quit 3 days ago. 5. History of breast cancer on anastrozole.   DVT prophylaxis: Lovenox. Code Status: Full code. Family Communication: Discussed with patient. Disposition Plan: Home. Consults called: None. Admission status: Observation.   Rise Patience MD [facility] Pager [phone].  If 7PM-7AM, please contact night-coverage [email or web address] Password [facility]  11/25/2017, 11:19 PM

## 2017-11-25 NOTE — ED Triage Notes (Signed)
The pt is c/o  Generalized body aches  Headache cough  Dry throat vomited x 2  No fever

## 2017-11-25 NOTE — ED Notes (Signed)
Pt noted to be at 81% on room air. Otila Kluver RN notified. Asked to put pt back on 2L O2. Will notify Plunkett MD

## 2017-11-25 NOTE — ED Provider Notes (Signed)
[facility] EMERGENCY DEPARTMENT Provider Note   CSN: [id number] Arrival date & time: 11/25/17  1633     History   Chief Complaint Chief Complaint  Patient presents with  . Generalized Body Aches    HPI SAYDIE GERDTS is a 72 y.o. female.  Patient is a 72 year old female with a history of coronary artery disease status post stent in 2003, CHF, hypertension, hyperlipidemia, seizures, diabetes presenting with flulike symptoms that started yesterday at noon.  She is having chills, generalized myalgias, fatigue, cough, 2 episodes of vomiting.  She denies any shortness of breath, abdominal pain.  She has had no lower extremity swelling.  Patient's husband states she was up all night last night coughing and she took Robitussin with some improvement.  Patient states when she is checked her temperature at home it has been 98.  She has been drinking water but has not been able to eat anything else.  Patient is still a smoker but does not use inhalers at home.  She also notes her son was recently diagnosed with flu and they have been in contact and she thinks she got it from him.   The history is provided by the patient.    Past Medical History:  Diagnosis Date  . CAD (coronary artery disease)    Cypher stent 09/2002  . CHF (congestive heart failure) ([place])   . Diabetes mellitus ([place])   . Hyperlipidemia   . Hypertension   . Myocardial infarction ([place])    2003,went into cardiac shock  . Osteoporosis   . Seizures ([place])    1st seizure age late 24's; most recent 07/28/17  . Sinusitis     Patient Active Problem List   Diagnosis Date Noted  . Dyslipidemia 03/21/2017  . Tobacco abuse 03/21/2017  . Type 2 diabetes mellitus with complication, with long-term current use of insulin ([place]) 03/21/2017  . Malignant neoplasm of upper-inner quadrant of left breast in female, estrogen receptor positive ([place]) 02/15/2017  . CAD (coronary artery disease) 03/12/2015    Past  Surgical History:  Procedure Laterality Date  . ABDOMINAL HYSTERECTOMY    . APPENDECTOMY    . BREAST LUMPECTOMY WITH RADIOACTIVE SEED AND SENTINEL LYMPH NODE BIOPSY Left 03/16/2017   Procedure: INJECT BLUE DYE LEFT BREAST, LEFT BREAST LUMPECTOMY WITH RADIOACTIVE SEED AND LEFT AXILLARY DEEP SENTINEL LYMPH NODE  BIOPSY ADJACENT TISSUE TRANSFER;  Surgeon: [name], MD;  Location: [facility];  Service: General;  Laterality: Left;  . CARPAL TUNNEL RELEASE Bilateral   . COLONOSCOPY WITH PROPOFOL N/A 05/30/2015   Procedure: COLONOSCOPY WITH PROPOFOL;  Surgeon: Carol Ada, MD;  Location: [facility];  Service: Endoscopy;  Laterality: N/A;  . CORONARY ANGIOPLASTY WITH STENT PLACEMENT  10-02-2002  . Exploratory abdominal     Bleeding in abdomen after tubal  . TUBAL LIGATION    . UMBILICAL HERNIA REPAIR      OB History    No data available       Home Medications    Prior to Admission medications   Medication Sig Start Date End Date Taking? Authorizing Provider  acetaminophen (TYLENOL) 500 MG tablet Take 1,000 mg by mouth every 6 (six) hours as needed for moderate pain or headache.    [provider]  anastrozole (ARIMIDEX) 1 MG tablet Take 1 tablet (1 mg total) daily by mouth. 08/25/17   Truitt Merle, MD  aspirin EC 81 MG tablet Take 81 mg by mouth at bedtime.    [provider]  atorvastatin (LIPITOR) 40 MG tablet Take 40 mg by mouth at bedtime. 12/07/16   [provider]  Calcium Carbonate-Vitamin D 600-400 MG-UNIT per tablet Take 1 tablet by mouth 2 (two) times daily.    [provider]  carvedilol (COREG) 3.125 MG tablet Take 3.125 mg by mouth 2 (two) times daily.     [provider]  clonazePAM (KLONOPIN) 1 MG tablet Take 1 mg by mouth at bedtime.  03/02/16   [provider]  glimepiride (AMARYL) 4 MG tablet Take 4 mg by mouth 2 (two) times daily. 03/03/16   [provider]  halobetasol (ULTRAVATE) 0.05 % cream Apply 1 application  topically 2 (two) times daily as needed (rash on leg). Applied to rash on leg (ankle area)    [provider]  Insulin Detemir (LEVEMIR) 100 UNIT/ML Pen Inject 17-25 Units into the skin 2 (two) times daily. 25 units in the morning & 17 units at night    [provider]  JANUVIA 100 MG tablet Take 100 mg by mouth at bedtime.  02/02/17   [provider]  levETIRAcetam (KEPPRA) 500 MG tablet Take 1 tablet (500 mg total) by mouth 2 (two) times daily. 10/05/17   Penumalli, Earlean Polka, MD  losartan (COZAAR) 50 MG tablet Take 50 mg by mouth at bedtime.     [provider]  metFORMIN (GLUCOPHAGE-XR) 500 MG 24 hr tablet Take 500 mg by mouth 2 (two) times daily. 02/02/17   [provider]  Multiple Vitamin (MULTIVITAMIN WITH MINERALS) TABS tablet Take 1 tablet by mouth at bedtime.    [provider]  nitroGLYCERIN (NITROSTAT) 0.4 MG SL tablet Place 1 tablet (0.4 mg total) under the tongue every 5 (five) minutes as needed for chest pain. 03/21/17 06/19/17  Minus Breeding, MD  Omega-3 Fatty Acids (FISH OIL) 1200 MG CAPS Take 2,400 mg by mouth 2 (two) times daily.    [provider]  omeprazole (PRILOSEC) 20 MG capsule Take 20 mg by mouth at bedtime.     [provider]  [facility] DELICA LANCETS 29B MISC 1 each by Other route 3 (three) times daily.  01/02/17   [provider]  [facility] VERIO test strip 1 each by Other route 3 (three) times daily.  01/02/17   [provider]  pioglitazone (ACTOS) 30 MG tablet Take 30 mg by mouth at bedtime. 02/23/17   [provider]  Polyethyl Glycol-Propyl Glycol (SYSTANE) 0.4-0.3 % SOLN Place 1 drop into both eyes 2 (two) times daily as needed (dry eyes).     [provider]  risedronate (ACTONEL) 150 MG tablet Take 150 mg by mouth every 30 (thirty) days. 03/02/16   [provider]  sertraline (ZOLOFT) 100 MG tablet Take 100 mg by mouth at bedtime. 02/02/17   [provider]    Family History Family History  Problem Relation Age of Onset  . Dementia Mother   . Kidney disease Mother   . Heart attack Father 11       Died age 50  . Heart attack Brother 29  . Heart attack Brother 63    Social History Social History   Tobacco Use  . Smoking status: Current Every Day Smoker    Packs/day: 2.00    Years: 30.00    Pack years: 60.00    Types: Cigarettes    Last attempt to quit: 03/09/2017    Years since quitting: 0.7  . Smokeless tobacco: Never Used  . Tobacco  comment: e cig, 08/02/17 1 PPD  Substance Use Topics  . Alcohol use: No    Alcohol/week: 0.0 oz  . Drug use: No     Allergies   Ace inhibitors; Keflex [cephalexin]; Phenobarbital; Sulfate; and Altace [ramipril]   Review of Systems Review of Systems  All other systems reviewed and are negative.    Physical Exam Updated Vital Signs BP 112/71 (BP Location: Right Arm)   Pulse 88   Temp (!) 101.2 F (38.4 C) (Rectal)   Resp 18   Ht 5\' 5"  (1.651 m)   Wt 54.4 kg (120 lb)   SpO2 92%   BMI 19.97 kg/m   Physical Exam  Constitutional: She is oriented to person, place, and time. She appears well-developed and well-nourished. No distress.  HENT:  Head: Normocephalic and atraumatic.  Mouth/Throat: Oropharynx is clear and moist. Mucous membranes are dry.  Eyes: Conjunctivae and EOM are normal. Pupils are equal, round, and reactive to light.  Neck: Normal range of motion. Neck supple.  Cardiovascular: Normal rate, regular rhythm and intact distal pulses.  No murmur heard. Pulmonary/Chest: Effort normal. No respiratory distress. She has decreased breath sounds in the right lower field and the left lower field. She has no wheezes. She has no rales.  Persistent coughing on exam  Abdominal: Soft. She exhibits no distension. There is no tenderness. There is no rebound and no guarding.  Musculoskeletal: Normal range of motion. She exhibits no edema or tenderness.  Neurological:  She is alert and oriented to person, place, and time.  Skin: Skin is warm and dry. No rash noted. No erythema.  Psychiatric: She has a normal mood and affect. Her behavior is normal.  Nursing note and vitals reviewed.    ED Treatments / Results  Labs (all labs ordered are listed, but only abnormal results are displayed) Labs Reviewed  CBC WITH DIFFERENTIAL/PLATELET - Abnormal; Notable for the following components:      Result Value   RBC 5.74 (*)    Hemoglobin 15.2 (*)    Platelets 107 (*)    Neutro Abs 8.3 (*)    Lymphs Abs 0.5 (*)    All other components within normal limits  COMPREHENSIVE METABOLIC PANEL - Abnormal; Notable for the following components:   Chloride 98 (*)    Glucose, Bld 147 (*)    AST 43 (*)    Total Bilirubin 1.6 (*)    All other components within normal limits  INFLUENZA PANEL BY PCR (TYPE A & B)    EKG  EKG Interpretation None       Radiology Dg Chest 2 View  Result Date: 11/25/2017 CLINICAL DATA:  Cough and fever. EXAM: CHEST  2 VIEW COMPARISON:  July 09, 2016 FINDINGS: The heart size and mediastinal contours are within normal limits. Both lungs are clear. The visualized skeletal structures are unremarkable. IMPRESSION: No active cardiopulmonary disease. Electronically Signed   By: Dorise Bullion III M.D   On: 11/25/2017 18:40    Procedures Procedures (including critical care time)  Medications Ordered in ED Medications  oseltamivir (TAMIFLU) capsule 75 mg (not administered)  ondansetron (ZOFRAN) injection 4 mg (4 mg Intravenous Given 11/25/17 1754)  sodium chloride 0.9 % bolus 1,000 mL (0 mLs Intravenous Stopped 11/25/17 1905)  guaiFENesin (ROBITUSSIN) 100 MG/5ML solution 100 mg (100 mg Oral Given 11/25/17 2033)  acetaminophen (TYLENOL) tablet 650 mg (650 mg Oral Given 11/25/17 1754)     Initial Impression / Assessment and Plan / ED Course  I  have reviewed the triage vital signs and the nursing notes.  Pertinent labs & imaging  results that were available during my care of the patient were reviewed by me and considered in my medical decision making (see chart for details).     Pt with symptoms consistent with influenza.  Febrile here but no signs of breathing difficulty.  No wheezing on exam. No signs of strep pharyngitis, otitis or abnormal abdominal findings.   CBC, CMP, CXR and flu pending.  Will continue antipyretica and  IVF fluids.  8:46 PM Patient's labs are reassuring.  Flu screen is still pending.  Chest x-ray without acute findings.  However on room air patient's oxygen saturation is 81%.  Patient does not wear oxygen at home.  With 2 L she remains about 95%.  Patient given Tamiflu and will admit   Final Clinical Impressions(s) / ED Diagnoses   Final diagnoses:  Influenza-like illness  Hypoxia    ED Discharge Orders    None       Blanchie Dessert, MD 11/25/17 2050

## 2017-11-25 NOTE — ED Notes (Signed)
Pt called out and said that she had to go to the bathroom. Pt unhooked to go to restroom. Pt walked across hall with no assistance or difficulty. Pt came back in room and hooked back up to monitor. Pt O2 sats noted to be at 84%. Pt placed back on O2 since she was already on it.

## 2017-11-26 ENCOUNTER — Other Ambulatory Visit: Payer: Self-pay

## 2017-11-26 ENCOUNTER — Encounter (HOSPITAL_COMMUNITY): Payer: Self-pay | Admitting: *Deleted

## 2017-11-26 DIAGNOSIS — C50212 Malignant neoplasm of upper-inner quadrant of left female breast: Secondary | ICD-10-CM | POA: Diagnosis not present

## 2017-11-26 DIAGNOSIS — Z794 Long term (current) use of insulin: Secondary | ICD-10-CM | POA: Diagnosis not present

## 2017-11-26 DIAGNOSIS — E785 Hyperlipidemia, unspecified: Secondary | ICD-10-CM

## 2017-11-26 DIAGNOSIS — Z720 Tobacco use: Secondary | ICD-10-CM

## 2017-11-26 DIAGNOSIS — Z170 Estrogen receptor positive status [ER+]: Secondary | ICD-10-CM | POA: Diagnosis not present

## 2017-11-26 DIAGNOSIS — I2510 Atherosclerotic heart disease of native coronary artery without angina pectoris: Secondary | ICD-10-CM

## 2017-11-26 DIAGNOSIS — E118 Type 2 diabetes mellitus with unspecified complications: Secondary | ICD-10-CM | POA: Diagnosis not present

## 2017-11-26 DIAGNOSIS — J9601 Acute respiratory failure with hypoxia: Secondary | ICD-10-CM | POA: Diagnosis not present

## 2017-11-26 DIAGNOSIS — J101 Influenza due to other identified influenza virus with other respiratory manifestations: Secondary | ICD-10-CM | POA: Diagnosis not present

## 2017-11-26 LAB — CBC
HCT: 42.9 % (ref 36.0–46.0)
HEMOGLOBIN: 14.2 g/dL (ref 12.0–15.0)
MCH: 26.6 pg (ref 26.0–34.0)
MCHC: 33.1 g/dL (ref 30.0–36.0)
MCV: 80.5 fL (ref 78.0–100.0)
Platelets: 78 10*3/uL — ABNORMAL LOW (ref 150–400)
RBC: 5.33 MIL/uL — AB (ref 3.87–5.11)
RDW: 15.2 % (ref 11.5–15.5)
WBC: 5.2 10*3/uL (ref 4.0–10.5)

## 2017-11-26 LAB — GLUCOSE, CAPILLARY
GLUCOSE-CAPILLARY: 98 mg/dL (ref 65–99)
GLUCOSE-CAPILLARY: 116 mg/dL — AB (ref 65–99)
Glucose-Capillary: 111 mg/dL — ABNORMAL HIGH (ref 65–99)
Glucose-Capillary: 115 mg/dL — ABNORMAL HIGH (ref 65–99)
Glucose-Capillary: 92 mg/dL (ref 65–99)

## 2017-11-26 LAB — BASIC METABOLIC PANEL
ANION GAP: 13 (ref 5–15)
BUN: 10 mg/dL (ref 6–20)
CALCIUM: 9 mg/dL (ref 8.9–10.3)
CHLORIDE: 101 mmol/L (ref 101–111)
CO2: 23 mmol/L (ref 22–32)
Creatinine, Ser: 0.56 mg/dL (ref 0.44–1.00)
GFR calc non Af Amer: >60 mL/min (ref >60)
Glucose, Bld: 103 mg/dL — ABNORMAL HIGH (ref 65–99)
Potassium: 3.8 mmol/L (ref 3.5–5.1)
Sodium: 137 mmol/L (ref 135–145)

## 2017-11-26 LAB — BRAIN NATRIURETIC PEPTIDE: B NATRIURETIC PEPTIDE 5: 222 pg/mL — AB (ref 0.0–100.0)

## 2017-11-26 LAB — TROPONIN I

## 2017-11-26 MED ORDER — OSELTAMIVIR PHOSPHATE 75 MG PO CAPS
75.0000 mg | ORAL_CAPSULE | Freq: Two times a day (BID) | ORAL | Status: DC
  Administered 2017-11-26 – 2017-11-27 (×4): 75 mg via ORAL
  Filled 2017-11-26 (×5): qty 1

## 2017-11-26 MED ORDER — ORAL CARE MOUTH RINSE
15.0000 mL | Freq: Two times a day (BID) | OROMUCOSAL | Status: DC
  Administered 2017-11-26 – 2017-11-27 (×2): 15 mL via OROMUCOSAL

## 2017-11-26 MED ORDER — GUAIFENESIN 100 MG/5ML PO SOLN
5.0000 mL | ORAL | Status: DC | PRN
  Administered 2017-11-26 – 2017-11-27 (×5): 100 mg via ORAL
  Filled 2017-11-26 (×5): qty 5

## 2017-11-26 NOTE — Progress Notes (Addendum)
PROGRESS NOTE  Monica [name]  ASN:[id number] DOB: [birth year] DOA: 11/25/2017 PCP: Jani Gravel, MD   Brief Narrative: Monica [name] is a 72 y.o. female with a history of CAD s/p stenting, T2DM, tobacco use and hyperlipidemia who presented to the ED due to fever, chills, and productive cough found to have influenza after exposure to her son who had influenza. CXR unremarkable. Due to weakness and hypoxia she was brought in for observation on tamiflu and continues to have significant new hypoxia, respiratory status worse than baseline.  Assessment & Plan: Principal Problem:   Acute respiratory failure with hypoxia (HCC) Active Problems:   CAD (coronary artery disease)   Malignant neoplasm of upper-inner quadrant of left breast in female, estrogen receptor positive ([place])   Dyslipidemia   Tobacco abuse   Type 2 diabetes mellitus with complication, with long-term current use of insulin (HCC)   Influenza A  Acute hypoxic respiratory failure due to influenza:  - Continue prn supplemental oxygen - Continue tamiflu 2/15 - 2/20. Recommended that her husband who is 71 years old with chronic medical conditions contact his PCP for consideration of ppx tamiflu.  CAD s/p stenting: EF normal 2016 and unremarkable stress testing. Appears euvolemic.  - Continue home ASA, statin, beta blocker  Tobacco use: Says she quit 3 days ago. Declines any medication support at this time.  - Cessation counseling reviewed  T2DM: HbA1c 8.5% - Home invokana, tradjenta, pioglitazone. On levemir and SSI.   Seizure disorder:  - Continue keppra   HTN: Stable - Continue beta blocker, ARB  History of breast cancer:  - Continue home anastrozole.  Anxiety:  - Continue clonazepam  DVT prophylaxis: Lovenox Code Status: Full Family Communication: None at bedside. Disposition Plan: Home once improved.  Consultants:   None  Procedures:   None  Antimicrobials:  Tamiflu 2/15 - 2/20    Subjective: Febrile overnight, feels very weak. Short of breath at rest. No chest pain or palpitations. Attempted to walk with RN today and SpO2 dropped to 84%.  Objective: Vitals:   11/25/17 2329 11/26/17 0024 11/26/17 0615 11/26/17 0936  BP:  (!) 112/48 (!) 121/92 (!) 98/53  Pulse:   (!) 22 85  Resp:  (!) 22 20 18   Temp: (!) 101.5 F (38.6 C) 99.3 F (37.4 C) 99 F (37.2 C) 98.9 F (37.2 C)  TempSrc: Oral Oral Oral Oral  SpO2:  92% 90% 90%  Weight:      Height:        Intake/Output Summary (Last 24 hours) at 11/26/2017 1355 Last data filed at 11/26/2017 0936 Gross per 24 hour  Intake 1360 ml  Output 0 ml  Net 1360 ml   Filed Weights   11/25/17 1642  Weight: 54.4 kg (120 lb)    Gen: Tired-appearing 72 y.o. female in no distress Pulm: Mildly labored tachypnea. Diminished with scattered rhonchi. No wheezing or crackles.  CV: Regular rate and rhythm. No murmur, rub, or gallop. No JVD, no pedal edema. GI: Abdomen soft, non-tender, non-distended, with normoactive bowel sounds. No organomegaly or masses felt. Ext: Warm, no deformities Skin: No rashes, lesions no ulcers Neuro: Alert and oriented. No focal neurological deficits. Psych: Judgement and insight appear normal. Mood & affect appropriate.   Data Reviewed: I have personally reviewed following labs and imaging studies  CBC: Recent Labs  Lab 11/25/17 1729 11/26/17 0504  WBC 9.2 5.2  NEUTROABS 8.3*  --   HGB 15.2* 14.2  HCT 45.7 42.9  MCV 79.6 80.5  PLT 107* 78*   Basic Metabolic Panel: Recent Labs  Lab 11/25/17 1729 11/26/17 0504  NA 136 137  K 4.0 3.8  CL 98* 101  CO2 24 23  GLUCOSE 147* 103*  BUN 10 10  CREATININE 0.70 0.56  CALCIUM 9.4 9.0   GFR: Estimated Creatinine Clearance: 55.4 mL/min (by C-G formula based on SCr of 0.56 mg/dL). Liver Function Tests: Recent Labs  Lab 11/25/17 1729  AST 43*  ALT 33  ALKPHOS 53  BILITOT 1.6*  PROT 7.5  ALBUMIN 4.3   No results for input(s):  LIPASE, AMYLASE in the last 168 hours. No results for input(s): AMMONIA in the last 168 hours. Coagulation Profile: No results for input(s): INR, PROTIME in the last 168 hours. Cardiac Enzymes: Recent Labs  Lab 11/26/17 0614  TROPONINI <0.03   BNP (last 3 results) No results for input(s): PROBNP in the last 8760 hours. HbA1C: No results for input(s): HGBA1C in the last 72 hours. CBG: Recent Labs  Lab 11/26/17 0016 11/26/17 0757 11/26/17 1159  GLUCAP 111* 92 98   Lipid Profile: No results for input(s): CHOL, HDL, LDLCALC, TRIG, CHOLHDL, LDLDIRECT in the last 72 hours. Thyroid Function Tests: No results for input(s): TSH, T4TOTAL, FREET4, T3FREE, THYROIDAB in the last 72 hours. Anemia Panel: No results for input(s): VITAMINB12, FOLATE, FERRITIN, TIBC, IRON, RETICCTPCT in the last 72 hours. Urine analysis:    Component Value Date/Time   COLORURINE YELLOW 07/09/2016 [place] 07/09/2016 0549   LABSPEC 1.009 07/09/2016 0549   PHURINE 6.0 07/09/2016 0549   GLUCOSEU 100 (A) 07/09/2016 0549   HGBUR NEGATIVE 07/09/2016 0549   BILIRUBINUR NEGATIVE 07/09/2016 0549   [place] 07/09/2016 0549   PROTEINUR NEGATIVE 07/09/2016 0549   NITRITE NEGATIVE 07/09/2016 0549   LEUKOCYTESUR SMALL (A) 07/09/2016 0549   No results found for this or any previous visit (from the past 240 hour(s)).    Radiology Studies: Dg Chest 2 View  Result Date: 11/25/2017 CLINICAL DATA:  Cough and fever. EXAM: CHEST  2 VIEW COMPARISON:  July 09, 2016 FINDINGS: The heart size and mediastinal contours are within normal limits. Both lungs are clear. The visualized skeletal structures are unremarkable. IMPRESSION: No active cardiopulmonary disease. Electronically Signed   By: Dorise Bullion III M.D   On: 11/25/2017 18:40    Scheduled Meds: . anastrozole  1 mg Oral Daily  . aspirin EC  81 mg Oral QHS  . atorvastatin  40 mg Oral QHS  . calcium-vitamin D  1 tablet Oral BID WC  .  canagliflozin  100 mg Oral QAC breakfast  . carvedilol  3.125 mg Oral BID WC  . clonazePAM  1 mg Oral QHS  . enoxaparin (LOVENOX) injection  40 mg Subcutaneous Daily  . insulin aspart  0-9 Units Subcutaneous TID WC  . insulin detemir  10 Units Subcutaneous QHS  . insulin detemir  20 Units Subcutaneous Daily  . levETIRAcetam  500 mg Oral BID  . linagliptin  5 mg Oral Daily  . losartan  50 mg Oral QHS  . mouth rinse  15 mL Mouth Rinse BID  . multivitamin with minerals  1 tablet Oral QHS  . omega-3 acid ethyl esters  1 g Oral Daily  . oseltamivir  75 mg Oral BID  . pantoprazole  40 mg Oral Daily  . pioglitazone  30 mg Oral QHS  . sertraline  100 mg Oral QHS   Continuous Infusions:   LOS: 0 days  Time spent: 25 minutes.  Vance Gather, MD [facility] Pager [phone]  If 7PM-7AM, please contact night-coverage [email or web address] Password [email or web address] 11/26/2017, 1:55 PM

## 2017-11-26 NOTE — Progress Notes (Signed)
Patient room air saturation while sitting at the bedside was 91%.Ambulated going out the hallway,halfway through going to the nurse station,her room air saturation dropped to 87%,turned around and nearly to her room her room air saturation dropped more to 84% with shortness of breathing and coughing spell.Explained the significance to patient.M.D. Notified.

## 2017-11-26 NOTE — Progress Notes (Signed)
New Admission Note:  Arrival Method: Stretcher  Mental Orientation:Alert and oriented x 4 Telemetry: Box 20 NSR Assessment: Completed Skin: Warm and dry  IV: NSL  Pain:Denies  Tubes: None  Safety Measures: Safety Fall Prevention Plan initiated.  Admission: Completed 5 M  Orientation: Patient has been orientated to the room, unit and the staff. Family:None   Orders have been reviewed and implemented. Will continue to monitor the patient. Call light has been placed within reach and bed alarm has been activated.   Sima Matas BSN, RN  Phone Number: [phone]

## 2017-11-27 DIAGNOSIS — J101 Influenza due to other identified influenza virus with other respiratory manifestations: Secondary | ICD-10-CM | POA: Diagnosis not present

## 2017-11-27 DIAGNOSIS — I2510 Atherosclerotic heart disease of native coronary artery without angina pectoris: Secondary | ICD-10-CM | POA: Diagnosis not present

## 2017-11-27 DIAGNOSIS — J9601 Acute respiratory failure with hypoxia: Secondary | ICD-10-CM | POA: Diagnosis not present

## 2017-11-27 LAB — GLUCOSE, CAPILLARY
GLUCOSE-CAPILLARY: 111 mg/dL — AB (ref 65–99)
Glucose-Capillary: 116 mg/dL — ABNORMAL HIGH (ref 65–99)

## 2017-11-27 MED ORDER — OSELTAMIVIR PHOSPHATE 75 MG PO CAPS: 75.0000 mg | ORAL_CAPSULE | Freq: Two times a day (BID) | ORAL | 0 refills | Status: DC

## 2017-11-27 NOTE — Discharge Summary (Signed)
Physician Discharge Summary  Monica [name] WNI:[id number] DOB: [birth year] DOA: 11/25/2017  PCP: Jani Gravel, MD  Admit date: 11/25/2017 Discharge date: 11/27/2017  Admitted From: Home Disposition: Home   Recommendations for Outpatient Follow-up:  1. Follow up with PCP in 1-2 weeks 2. Continue smoking cessation counseling  Home Health: None Equipment/Devices: None Discharge Condition: Stable CODE STATUS: Full Diet recommendation: Heart healthy, carb-modified   Brief/Interim Summary: Monica [name] is a 72 y.o. female with a history of CAD s/p stenting, T2DM, tobacco use and hyperlipidemia who presented to the ED due to fever, chills, and productive cough found to have influenza after exposure to her son who had influenza. CXR unremarkable. Due to weakness and hypoxia she was brought in for observation on tamiflu. Symptoms have improved and she is stable for discharge.  Discharge Diagnoses:  Principal Problem:   Acute respiratory failure with hypoxia (HCC) Active Problems:   CAD (coronary artery disease)   Malignant neoplasm of upper-inner quadrant of left breast in female, estrogen receptor positive ([place])   Dyslipidemia   Tobacco abuse   Type 2 diabetes mellitus with complication, with long-term current use of insulin (HCC)   Influenza A  Acute hypoxic respiratory failure due to influenza: Resolved. Pt stable on room air as of 2/17. - Continue tamiflu 2/15 - 2/20. Recommended that her husband who is 40 years old with chronic medical conditions contact his PCP for consideration of ppx tamiflu.  CAD s/p stenting: EF normal 2016 and unremarkable stress testing. Appears euvolemic.  - Continue home ASA, statin, beta blocker  Tobacco use: Says she quit 3 days PTA. Declines any medication support at this time.  - Cessation counseling reviewed, encouraged PCP follow up  T2DM: HbA1c 8.5%. Continue home medications  Seizure disorder:  - Continue keppra   HTN: Stable -  Continue beta blocker, ARB  History of breast cancer:  - Continue home anastrozole.  Anxiety:  - Continue clonazepam  Discharge Instructions Discharge Instructions    Diet - low sodium heart healthy   Complete by:  As directed    Discharge instructions   Complete by:  As directed    You were admitted for difficulty breathing due to the flu. Fortunately, this has improved and you are stable for discharge with the following recommendations:  - Take tamiflu twice daily for the next 4 days.  - Follow up with your doctor in the next 1 - 2 weeks - Continue NOT smoking. This will help you get better faster.  - Seek medical attention right away if you experience worsening shortness of breath or chest pain.   Increase activity slowly   Complete by:  As directed      Allergies as of 11/27/2017      Reactions   Ace Inhibitors Dermatitis, Cough   Keflex [cephalexin] Itching   Phenobarbital Other (See Comments)   Black spots on hands and feet.   Sulfate Nausea And Vomiting, Other (See Comments)   Dizziness   Altace [ramipril] Other (See Comments)   UNSPECIFIED REACTION        Medication List    TAKE these medications   acetaminophen 500 MG tablet Commonly known as:  TYLENOL Take 1,000 mg by mouth every 6 (six) hours as needed for moderate pain or headache.   anastrozole 1 MG tablet Commonly known as:  ARIMIDEX Take 1 tablet (1 mg total) daily by mouth.   aspirin EC 81 MG tablet Take 81 mg by mouth at bedtime.   atorvastatin  40 MG tablet Commonly known as:  LIPITOR Take 40 mg by mouth at bedtime.   Calcium Carbonate-Vitamin D 600-400 MG-UNIT tablet Take 1 tablet by mouth 2 (two) times daily.   carvedilol 3.125 MG tablet Commonly known as:  COREG Take 3.125 mg by mouth 2 (two) times daily.   clonazePAM 1 MG tablet Commonly known as:  KLONOPIN Take 1 mg by mouth at bedtime.   Fish Oil 1200 MG Caps Take 2,400 mg by mouth 2 (two) times daily.   glimepiride 4 MG  tablet Commonly known as:  AMARYL Take 4 mg by mouth 2 (two) times daily.   halobetasol 0.05 % cream Commonly known as:  ULTRAVATE Apply 1 application topically 2 (two) times daily as needed (rash on leg). Applied to rash on leg (ankle area)   Insulin Detemir 100 UNIT/ML Pen Commonly known as:  LEVEMIR Inject 17-25 Units into the skin 2 (two) times daily. 25 units in the morning & 17 units at night   JANUVIA 100 MG tablet Generic drug:  sitaGLIPtin Take 100 mg by mouth at bedtime.   JARDIANCE 10 MG Tabs tablet Generic drug:  empagliflozin Take 10 mg by mouth daily.   levETIRAcetam 500 MG tablet Commonly known as:  KEPPRA Take 1 tablet (500 mg total) by mouth 2 (two) times daily.   losartan 50 MG tablet Commonly known as:  COZAAR Take 50 mg by mouth at bedtime.   metFORMIN 500 MG 24 hr tablet Commonly known as:  GLUCOPHAGE-XR Take 500 mg by mouth 2 (two) times daily.   multivitamin with minerals Tabs tablet Take 1 tablet by mouth at bedtime.   nitroGLYCERIN 0.4 MG SL tablet Commonly known as:  NITROSTAT Place 1 tablet (0.4 mg total) under the tongue every 5 (five) minutes as needed for chest pain.   omeprazole 20 MG capsule Commonly known as:  PRILOSEC Take 20 mg by mouth at bedtime.   oseltamivir 75 MG capsule Commonly known as:  TAMIFLU Take 1 capsule (75 mg total) by mouth 2 (two) times daily.   pioglitazone 30 MG tablet Commonly known as:  ACTOS Take 30 mg by mouth at bedtime.   risedronate 150 MG tablet Commonly known as:  ACTONEL Take 150 mg by mouth every 30 (thirty) days.   sertraline 100 MG tablet Commonly known as:  ZOLOFT Take 100 mg by mouth at bedtime.   SYSTANE 0.4-0.3 % Soln Generic drug:  Polyethyl Glycol-Propyl Glycol Place 1 drop into both eyes 2 (two) times daily as needed (dry eyes).      Follow-up Information    Jani Gravel, MD. Schedule an appointment as soon as possible for a visit in 1 week(s).   Specialty:  Internal  Medicine Contact information: [place] [id number] [phone]          Allergies  Allergen Reactions  . Ace Inhibitors Dermatitis and Cough  . Keflex [Cephalexin] Itching  . Phenobarbital Other (See Comments)    Black spots on hands and feet.  . Sulfate Nausea And Vomiting and Other (See Comments)    Dizziness   . Altace [Ramipril] Other (See Comments)    UNSPECIFIED REACTION      Consultations:  None  Procedures/Studies: Dg Chest 2 View  Result Date: 11/25/2017 CLINICAL DATA:  Cough and fever. EXAM: CHEST  2 VIEW COMPARISON:  July 09, 2016 FINDINGS: The heart size and mediastinal contours are within normal limits. Both lungs are clear. The visualized skeletal structures are unremarkable.  IMPRESSION: No active cardiopulmonary disease. Electronically Signed   By: Dorise Bullion III M.D   On: 11/25/2017 18:40   Subjective: Feels like she turned the corner this morning. Has been ambulating without assistance. Some shortness of breath that is worse than baseline but this is now modest and manageable.   Discharge Exam: Vitals:   11/27/17 0454 11/27/17 0948  BP: (!) 98/48 (!) 92/55  Pulse: 76 (!) 102  Resp: 19 20  Temp: 98.8 F (37.1 C) 97.7 F (36.5 C)  SpO2: 95% 98%   General: Pt is alert, awake, not in acute distress Cardiovascular: RRR, S1/S2 +, no rubs, no gallops Respiratory: Clear bilaterally without increased work of breathing on room air. Abdominal: Soft, NT, ND, bowel sounds + Extremities: No edema, no cyanosis  Labs: BNP (last 3 results) Recent Labs    11/26/17 0614  BNP 830.9*   Basic Metabolic Panel: Recent Labs  Lab 11/25/17 1729 11/26/17 0504  NA 136 137  K 4.0 3.8  CL 98* 101  CO2 24 23  GLUCOSE 147* 103*  BUN 10 10  CREATININE 0.70 0.56  CALCIUM 9.4 9.0   Liver Function Tests: Recent Labs  Lab 11/25/17 1729  AST 43*  ALT 33  ALKPHOS 53  BILITOT 1.6*  PROT 7.5  ALBUMIN 4.3    CBC: Recent Labs  Lab 11/25/17 1729 11/26/17 0504  WBC 9.2 5.2  NEUTROABS 8.3*  --   HGB 15.2* 14.2  HCT 45.7 42.9  MCV 79.6 80.5  PLT 107* 78*   Cardiac Enzymes: Recent Labs  Lab 11/26/17 0614  TROPONINI <0.03   CBG: Recent Labs  Lab 11/26/17 1159 11/26/17 1715 11/26/17 2031 11/27/17 0810 11/27/17 1133  GLUCAP 98 116* 115* 116* 111*    Time coordinating discharge: Approximately 40 minutes  Vance Gather, MD  [facility] 11/27/2017, 1:23 PM Pager [phone]

## 2018-01-20 NOTE — Progress Notes (Signed)
[facility]  [phone] [phone]  Clinic Follow-up Note   Patient Care Team: Jani Gravel, MD as PCP - General (Internal Medicine) [name], MD as Consulting Physician (General Surgery) Truitt Merle, MD as Consulting Physician (Hematology) Kyung Rudd, MD as Consulting Physician (Radiation Oncology) Gardenia Phlegm, NP as Nurse Practitioner (Hematology and Oncology) 01/23/2018  CHIEF COMPLAINTS:  Follow up left Breast cancer   Oncology History   Cancer Staging Malignant neoplasm of upper-inner quadrant of left breast in female, estrogen receptor positive ([place]) Staging form: Breast, AJCC 8th Edition - Clinical stage from 02/16/2017: Stage IA (cT1b, cN0, cM0, G2, ER: Positive, PR: Positive, HER2: Negative) - Unsigned Staging comments: Staged at breast conference  - Pathologic stage from 03/16/2017: Stage IA (pT1b, pN0, cM0, G2, ER: Positive, PR: Positive, HER2: Negative, Oncotype DX score: 27) - Signed by Truitt Merle, MD on 04/24/2017       Malignant neoplasm of upper-inner quadrant of left breast in female, estrogen receptor positive ([place])   02/09/2017 Initial Diagnosis    Malignant neoplasm of upper-inner quadrant of left breast in female, estrogen receptor positive ([place])      02/09/2017 Initial Biopsy    Diagnosis Breast, left, needle core biopsy - INVASIVE DUCTAL CARCINOMA, 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 Dalbert Batman       03/16/2017 Pathology Results    PATHOLOGY REPORT Diagnosis 03/16/17 1. Breast, lumpectomy, Left - INVASIVE DUCTAL CARCINOMA, NOTTINGHAM GRADE 2 OF 3, 0.9 CM - DUCTAL  CARCINOMA IN SITU - MARGINS UNINVOLVED BY CARCINOMA (0.3 CM POSTERIOR MARGIN) - PREVIOUS BIOPSY SITE CHANGES - SEE ONCOLOGY TABLE BELOW 2. Lymph node, sentinel, biopsy, Left axillary #1 - NO CARCINOMA IDENTIFIED IN ONE LYMPH NODE (0/1) 3. Lymph node, sentinel, biopsy, Left axillary #2 - NO CARCINOMA IDENTIFIED IN ONE LYMPH NODE (0/1) 4. Lymph node, sentinel, biopsy, Left axillary #3 - NO CARCINOMA IDENTIFIED IN ONE LYMPH NODE (0/1) 5. Lymph node, sentinel, biopsy, Left axillary #4 - NO CARCINOMA IDENTIFIED IN ONE LYMPH NODE (0/1)       03/16/2017 Oncotype testing    Recurrance score of 27 with a 18% distance recurrance in the net 10 years with Tamoxifen alone       05/19/2017 - 06/16/2017 Radiation Therapy    Radiation with Dr. Lisbeth Renshaw      06/2017 -  Anti-estrogen oral therapy    anastrozole 1 mg once a day starting late 06/2017          HISTORY OF PRESENTING ILLNESS: 02/16/17 Monica [name] 72 y.o. female is here because of newly diagnosed left breast cancer. She is accompanied by her multiple family members to our disciplinary breast clinic today.  This was found by a mammogram screening of the left breast. She had a abnormal benign mammogram 35 years ago and had surgery to remove the mass form the left breast. She denies feeling a lump herself and denies any change lately. She reports having a fair appetite and no weight loss. She ranges 124/127 pounds. She denies and pain and aches but has pain in her left knee joint but tolerable as well in her hip. She takes  tylenol for pain. She has a history of DM and follows up with Dr. Maudie Mercury. Her sugar has been running high lightly. Had a CHF and MI in 2003 and has a stent.She denies chest pain and her last echo was in 2016 and is normal. She has not had a stroke. No history of breast cancer in family. She still smokes.   Her most exercise is walking her dog and she remain moving around the house. She will see her PCP next week   GYN HISTORY    Menarchal: 11 LMP: many years ago, not sure  Contraceptive: none  HRT: none  G3P3: She was 25 at first live births.   CURRENT THERAPY: anastrozole 1 mg once a day starting 06/2017    INTERVAL HISTORY:  Monica [name] is here for a follow up. She presents to the clinic today accompanied by her husband. She reports she is doing well overall.  She was hospitalized for influenza infection in mid February 2019, which slowed her down for a month.  She still has mild fatigue.  Denies any significant pain, dyspnea, or other new symptoms.  She is compliant with anastrozole, tolerating very well, without noticeable side effects, except mild irritability which was noticed by her husband.  She has lost 5 pounds in the past for 5 months.  MEDICAL HISTORY:  Past Medical History:  Diagnosis Date  . CAD (coronary artery disease)    Cypher stent 09/2002  . CHF (congestive heart failure) ([place])   . Diabetes mellitus ([place])   . Hyperlipidemia   . Hypertension   . Myocardial infarction ([place])    2003,went into cardiac shock  . Osteoporosis   . Seizures ([place])    1st seizure age late 27's; most recent 07/28/17  . Sinusitis     SURGICAL HISTORY: Past Surgical History:  Procedure Laterality Date  . ABDOMINAL HYSTERECTOMY    . APPENDECTOMY    . BREAST LUMPECTOMY WITH RADIOACTIVE SEED AND SENTINEL LYMPH NODE BIOPSY Left 03/16/2017   Procedure: INJECT BLUE DYE LEFT BREAST, LEFT BREAST LUMPECTOMY WITH RADIOACTIVE SEED AND LEFT AXILLARY DEEP SENTINEL LYMPH NODE  BIOPSY ADJACENT TISSUE TRANSFER;  Surgeon: [name], MD;  Location: [facility];  Service: General;  Laterality: Left;  . CARPAL TUNNEL RELEASE Bilateral   . COLONOSCOPY WITH PROPOFOL N/A 05/30/2015   Procedure: COLONOSCOPY WITH PROPOFOL;  Surgeon: Carol Ada, MD;  Location: [facility];  Service: Endoscopy;  Laterality: N/A;  . CORONARY ANGIOPLASTY WITH STENT PLACEMENT  10-02-2002  . Exploratory abdominal     Bleeding in abdomen after tubal  .  TUBAL LIGATION    . UMBILICAL HERNIA REPAIR      SOCIAL HISTORY: Social History   Socioeconomic History  . Marital status: Married    Spouse name: Sonia Side  . Number of children: 3  . Years of education: 85  . Highest education level: Not on file  Occupational History  . Not on file  Social Needs  . Financial resource strain: Not on file  . Food insecurity:    Worry: Not on file    Inability: Not on file  . Transportation needs:    Medical: Not on file    Non-medical: Not on file  Tobacco Use  . Smoking status: Current Every Day Smoker    Packs/day: 2.00    Years: 30.00    Pack years: 60.00    Types: Cigarettes    Last attempt to quit: 03/09/2017    Years since quitting: 0.8  .  Smokeless tobacco: Never Used  . Tobacco comment: e cig, 08/02/17 1 PPD  Substance and Sexual Activity  . Alcohol use: No    Alcohol/week: 0.0 oz  . Drug use: No  . Sexual activity: Not on file  Lifestyle  . Physical activity:    Days per week: Not on file    Minutes per session: Not on file  . Stress: Not on file  Relationships  . Social connections:    Talks on phone: Not on file    Gets together: Not on file    Attends religious service: Not on file    Active member of club or organization: Not on file    Attends meetings of clubs or organizations: Not on file    Relationship status: Not on file  . Intimate partner violence:    Fear of current or ex partner: Not on file    Emotionally abused: Not on file    Physically abused: Not on file    Forced sexual activity: Not on file  Other Topics Concern  . Not on file  Social History Narrative   Lives with husband.    Caffeine-     FAMILY HISTORY: Family History  Problem Relation Age of Onset  . Dementia Mother   . Kidney disease Mother   . Heart attack Father 4       Died age 72  . Heart attack Brother 8  . Heart attack Brother 37    ALLERGIES:  is allergic to ace inhibitors; keflex [cephalexin]; phenobarbital; sulfate; and  altace [ramipril].  MEDICATIONS:  Current Outpatient Medications  Medication Sig Dispense Refill  . acetaminophen (TYLENOL) 500 MG tablet Take 1,000 mg by mouth every 6 (six) hours as needed for moderate pain or headache.    . anastrozole (ARIMIDEX) 1 MG tablet Take 1 tablet (1 mg total) daily by mouth. 90 tablet 1  . aspirin EC 81 MG tablet Take 81 mg by mouth at bedtime.    Marland Kitchen atorvastatin (LIPITOR) 40 MG tablet Take 40 mg by mouth at bedtime.    . Calcium Carbonate-Vitamin D 600-400 MG-UNIT per tablet Take 1 tablet by mouth 2 (two) times daily.    . carvedilol (COREG) 3.125 MG tablet Take 3.125 mg by mouth 2 (two) times daily.     . clonazePAM (KLONOPIN) 1 MG tablet Take 1 mg by mouth at bedtime.     Marland Kitchen glimepiride (AMARYL) 4 MG tablet Take 4 mg by mouth 2 (two) times daily.    . halobetasol (ULTRAVATE) 0.05 % cream Apply 1 application topically 2 (two) times daily as needed (rash on leg). Applied to rash on leg (ankle area)    . Insulin Detemir (LEVEMIR) 100 UNIT/ML Pen Inject 17-25 Units into the skin 2 (two) times daily. 25 units in the morning & 17 units at night    . JANUVIA 100 MG tablet Take 100 mg by mouth at bedtime.     Marland Kitchen JARDIANCE 10 MG TABS tablet Take 10 mg by mouth daily.    Marland Kitchen levETIRAcetam (KEPPRA) 500 MG tablet Take 1 tablet (500 mg total) by mouth 2 (two) times daily. 180 tablet 4  . losartan (COZAAR) 50 MG tablet Take 50 mg by mouth at bedtime.     . metFORMIN (GLUCOPHAGE-XR) 500 MG 24 hr tablet Take 500 mg by mouth 2 (two) times daily.    . Multiple Vitamin (MULTIVITAMIN WITH MINERALS) TABS tablet Take 1 tablet by mouth at bedtime.    . Omega-3  Fatty Acids (FISH OIL) 1200 MG CAPS Take 2,400 mg by mouth 2 (two) times daily.    Marland Kitchen omeprazole (PRILOSEC) 20 MG capsule Take 20 mg by mouth at bedtime.     . pioglitazone (ACTOS) 30 MG tablet Take 30 mg by mouth at bedtime.    Vladimir Faster Glycol-Propyl Glycol (SYSTANE) 0.4-0.3 % SOLN Place 1 drop into both eyes 2 (two) times daily  as needed (dry eyes).     . risedronate (ACTONEL) 150 MG tablet Take 150 mg by mouth every 30 (thirty) days.    Marland Kitchen sertraline (ZOLOFT) 100 MG tablet Take 100 mg by mouth at bedtime.    . nitroGLYCERIN (NITROSTAT) 0.4 MG SL tablet Place 1 tablet (0.4 mg total) under the tongue every 5 (five) minutes as needed for chest pain. 25 tablet 1  . sertraline (ZOLOFT) 50 MG tablet Take 1 tablet (50 mg total) by mouth daily. 30 tablet 2   No current facility-administered medications for this visit.     REVIEW OF SYSTEMS:   Constitutional: Denies fevers, chills or abnormal night sweats  Eyes: Denies blurriness of vision, double vision or watery eyes Ears, nose, mouth, throat, and face: Denies mucositis or sore throat Respiratory: Denies cough, dyspnea or wheezes Cardiovascular: Denies palpitation, chest discomfort or lower extremity swelling Gastrointestinal:  Denies nausea, heartburn or change in bowel habits FBP:ZWCHENID Skin: Denies abnormal skin rashes Lymphatics: Denies new lymphadenopathy or easy bruising Neurological:Denies numbness, tingling or new weaknesses Behavioral/Psych: Mood is stable, no new changes  All other systems were reviewed with the patient and are negative. Breast: (+)Mild tenderness post radiation/surgery  PHYSICAL EXAMINATION: ECOG PERFORMANCE STATUS: 0 - Asymptomatic BP 115/61 (BP Location: Right Arm, Patient Position: Sitting)   Pulse (!) 57 Comment: Thu RN is aware  Temp 97.6 F (36.4 C) (Oral)   Resp 18   Ht '5\' 5"'$  (1.651 m)   Wt 116 lb 8 oz (52.8 kg)   SpO2 97%   BMI 19.39 kg/m   GENERAL:alert, no distress and comfortable SKIN: skin color, texture, turgor are normal, no rashes or significant lesions EYES: normal, conjunctiva are pink and non-injected, sclera clear OROPHARYNX:no exudate, no erythema and lips, buccal mucosa, and tongue normal  NECK: supple, thyroid normal size, non-tender, without nodularity LYMPH:  no palpable lymphadenopathy in the cervical,  axillary or inguinal LUNGS: clear to auscultation and percussion with normal breathing effort HEART: regular rate & rhythm and no murmurs and no lower extremity edema ABDOMEN: abdomen soft, non-tender and normal bowel sounds Musculoskeletal:no cyanosis of digits and no clubbing  PSYCH: alert & oriented x 3 with fluent speech NEURO: no focal motor/sensory deficits Breasts: Breast inspection showed them to be symmetrical with no nipple discharge. Palpation of the breasts and axilla revealed no obvious mass that I could appreciate. (+) left UOQ and left axilla incisions, healed well. No palpable mass.  LABORATORY DATA:  I have reviewed the data as listed CBC Latest Ref Rng & Units 01/23/2018 11/26/2017 11/25/2017  WBC 3.9 - 10.3 K/uL 6.1 5.2 9.2  Hemoglobin 11.6 - 15.9 g/dL 14.4 14.2 15.2(H)  Hematocrit 34.8 - 46.6 % 44.0 42.9 45.7  Platelets 145 - 400 K/uL 96(L) 78(L) 107(L)    CMP Latest Ref Rng & Units 01/23/2018 11/26/2017 11/25/2017  Glucose 70 - 140 mg/dL 98 103(H) 147(H)  BUN 7 - 26 mg/dL '9 10 10  '$ Creatinine 0.60 - 1.10 mg/dL 0.68 0.56 0.70  Sodium 136 - 145 mmol/L 141 137 136  Potassium 3.5 - 5.1  mmol/L 3.9 3.8 4.0  Chloride 98 - 109 mmol/L 105 101 98(L)  CO2 22 - 29 mmol/L '29 23 24  '$ Calcium 8.4 - 10.4 mg/dL 10.5(H) 9.0 9.4  Total Protein 6.4 - 8.3 g/dL 7.3 - 7.5  Total Bilirubin 0.2 - 1.2 mg/dL 0.4 - 1.6(H)  Alkaline Phos 40 - 150 U/L 61 - 53  AST 5 - 34 U/L 21 - 43(H)  ALT 0 - 55 U/L 20 - 33    PATHOLOGY REPORT Diagnosis 03/16/17 1. Breast, lumpectomy, Left - INVASIVE DUCTAL CARCINOMA, NOTTINGHAM GRADE 2 OF 3, 0.9 CM - DUCTAL CARCINOMA IN SITU - MARGINS UNINVOLVED BY CARCINOMA (0.3 CM POSTERIOR MARGIN) - PREVIOUS BIOPSY SITE CHANGES - SEE ONCOLOGY TABLE BELOW 2. Lymph node, sentinel, biopsy, Left axillary #1 - NO CARCINOMA IDENTIFIED IN ONE LYMPH NODE (0/1) 3. Lymph node, sentinel, biopsy, Left axillary #2 - NO CARCINOMA IDENTIFIED IN ONE LYMPH NODE (0/1) 4. Lymph node,  sentinel, biopsy, Left axillary #3 - NO CARCINOMA IDENTIFIED IN ONE LYMPH NODE (0/1) 5. Lymph node, sentinel, biopsy, Left axillary #4 - NO CARCINOMA IDENTIFIED IN ONE LYMPH NODE (0/1) Microscopic Comment 1. BREAST, INVASIVE TUMOR Procedure: Excision Laterality: Left Tumor Size: 0.9 x 0.8 x 0.7 cm Histologic Type: Invasive carcinoma of no special type (ductal, not otherwise specified) Grade: Nottingham Grade 2 Tubular Differentiation: 2 Nuclear Pleomorphism: 2 Mitotic Count: 2 Ductal Carcinoma in Situ (DCIS): Present (solid, cribriform patterns) Margins: Uninvolved by carcinoma Invasive carcinoma, distance from closest margin: 0.3 cm (posterior) DCIS, distance from closest margin: 0.4 cm (superior) Regional Lymph Nodes: Number of Lymph Nodes Examined: 4 Microscopic Comment(continued) Number of Sentinel Lymph Nodes Examined: 4 Lymph Nodes with Macrometastases: 0 Lymph Nodes with Micrometastases: 0 Lymph Nodes with Isolated Tumor Cells: 0 Breast Prognostic Profile: See [id number] (02/10/2017) Estrogen Receptor: Positive (100%, Strong) Progesterone Receptor: Positive (15%, Strong) Her2: Negative Ki-67: 15% Best tumor block for sendout testing: 1B Treatment Effect: No known presurgical therapy Pathologic Stage Classification (pTNM, AJCC 8th Edition): Primary Tumor: pT1b Regional Lymph Nodes: pN0 DAWN  Diagnosis 02/09/17 Breast, left, needle core biopsy - INVASIVE DUCTAL CARCINOMA. - DUCTAL CARCINOMA IN SITU. - SEE COMMENT. Microscopic Comment The carcinoma appears grade I - II. A breast prognostic profile will be performed and the results reported separately. The results were called to [facility] on 02/10/2017. Results: IMMUNOHISTOCHEMICAL AND MORPHOMETRIC ANALYSIS PERFORMED MANUALLY Estrogen Receptor: 100%, POSITIVE, STRONG STAINING INTENSITY Progesterone Receptor: 15%, POSITIVE, STRONG STAINING INTENSITY Proliferation Marker Ki67: 15% REFERENCE RANGE ESTROGEN  RECEPTOR NEGATIVE 0% POSITIVE =>1% REFERENCE RANGE PROGESTERONE RECEPTOR NEGATIVE 0% POSITIVE =>1% All controls stained appropriately Results: HER2 - NEGATIVE RATIO OF HER2/CEP17 SIGNALS 1.24 AVERAGE HER2 COPY NUMBER PER CELL 1.80 Reference Range: NEGATIVE HER2/CEP17 Ratio <2.0 and average HER2 copy number <4.0 EQUIVOCAL HER2/CEP17 Ratio <2.0 and average HER2 copy number >=4.0 and <6.0   ONCOTYPE 03/16/17    RADIOGRAPHIC STUDIES: I have personally reviewed the radiological images as listed and agreed with the findings in the report. No results found.   ASSESSMENT & PLAN:  Monica [name] is 72 y.o. caucasian female who has a history of an MI, CHF and DM. She is here for her newly diagnosed Breast Cancer  1. 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 -the Oncotype Dx result was reviewed with her in details. She had  intermediate risk based on the recurrence score 27, which predicts 10 year distant recurrence after 5 years of tamoxifen 18%. The benefit of chemotherapy in the intermediate risk group is small and controversial. Given her advanced age, comorbidities, strong ER and PR expression in her tumor, we decided not to pursue adjuvant chemotherapy. -She completed radiation on 06/17/17 and tolerated very well with only mild reaction.  -She went to survivorship clinic in Dec 2018 -She started adjuvant anastrozole in late 06/2017 and has tolerated well with no issues so far. -She is clinically doing very well, asymptomatic.  Lab reviewed, mild thrombocytopenia is stable, no other concerns.  She is scheduled for annual mammogram later this month.  No clinical concern for recurrence. -continue adjuvant anastrozole, and breast cancer surveillance.  2. DM -Much better controlled lately.   She is on insulin and multiple oral diabetic medications -Follows up with Dr. Maudie Mercury her PCP   3. Mild thrombocytopenia  -She has long-standing history of mild pancytopenia, related around 100, no history of significant bleeding.  -No history of liver disease or alcohol abuse. 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 -We'll continue monitoring, Stable   4. Osteopenia  -Continue calcium and vitamin D -Based on her DEXA from 06/30/17 she is osteopenic with a left femur neck T-Score of -2.4  -We discussed the potential negative impact on her bone density from anastrozole.  We will follow-up her bone density scan every 2 years -she has started risedronate by her PCP, tolerating well, will continue.  5.  Depression and mood swing -She has become slightly more irritable since she started anastrozole - she is o Zoloft 100 mg daily, I recommend her to increase to 150 mg daily, I called in additional 50 mg daily dose for her.  PLAN: -Continue anastrozole  -We will increase Zoloft from 100 mg to 150 mg daily -Lab and follow-up in 4 months -She is scheduled to have a mammogram later this months  No orders of the defined types were placed in this encounter.   All questions were answered. The patient knows to call the clinic with any problems, questions or concerns. I spent 20 minutes counseling the patient face to face. The total time spent in the appointment was 25 minutes and more than 50% was on counseling.    Truitt Merle, MD 01/23/2018 4:16 PM

## 2018-01-23 ENCOUNTER — Inpatient Hospital Stay: Payer: Medicare Other | Attending: Hematology | Admitting: Hematology

## 2018-01-23 ENCOUNTER — Encounter: Payer: Self-pay | Admitting: Hematology

## 2018-01-23 ENCOUNTER — Telehealth: Payer: Self-pay | Admitting: Hematology

## 2018-01-23 ENCOUNTER — Inpatient Hospital Stay: Payer: Medicare Other

## 2018-01-23 VITALS — BP 115/61 | HR 57 | Temp 97.6°F | Resp 18 | Ht 65.0 in | Wt 116.5 lb

## 2018-01-23 DIAGNOSIS — I509 Heart failure, unspecified: Secondary | ICD-10-CM | POA: Insufficient documentation

## 2018-01-23 DIAGNOSIS — E119 Type 2 diabetes mellitus without complications: Secondary | ICD-10-CM | POA: Diagnosis not present

## 2018-01-23 DIAGNOSIS — I252 Old myocardial infarction: Secondary | ICD-10-CM | POA: Diagnosis not present

## 2018-01-23 DIAGNOSIS — D61818 Other pancytopenia: Secondary | ICD-10-CM | POA: Insufficient documentation

## 2018-01-23 DIAGNOSIS — Z79811 Long term (current) use of aromatase inhibitors: Secondary | ICD-10-CM | POA: Insufficient documentation

## 2018-01-23 DIAGNOSIS — C50212 Malignant neoplasm of upper-inner quadrant of left female breast: Secondary | ICD-10-CM | POA: Insufficient documentation

## 2018-01-23 DIAGNOSIS — F329 Major depressive disorder, single episode, unspecified: Secondary | ICD-10-CM | POA: Diagnosis not present

## 2018-01-23 DIAGNOSIS — Z170 Estrogen receptor positive status [ER+]: Secondary | ICD-10-CM | POA: Insufficient documentation

## 2018-01-23 DIAGNOSIS — I110 Hypertensive heart disease with heart failure: Secondary | ICD-10-CM | POA: Diagnosis not present

## 2018-01-23 DIAGNOSIS — M8580 Other specified disorders of bone density and structure, unspecified site: Secondary | ICD-10-CM | POA: Diagnosis not present

## 2018-01-23 LAB — CBC WITH DIFFERENTIAL/PLATELET
BASOS ABS: 0 10*3/uL (ref 0.0–0.1)
BASOS PCT: 0 %
Eosinophils Absolute: 0.1 10*3/uL (ref 0.0–0.5)
Eosinophils Relative: 2 %
HEMATOCRIT: 44 % (ref 34.8–46.6)
HEMOGLOBIN: 14.4 g/dL (ref 11.6–15.9)
LYMPHS PCT: 27 %
Lymphs Abs: 1.7 10*3/uL (ref 0.9–3.3)
MCH: 25.8 pg (ref 25.1–34.0)
MCHC: 32.7 g/dL (ref 31.5–36.0)
MCV: 78.9 fL — AB (ref 79.5–101.0)
Monocytes Absolute: 0.4 10*3/uL (ref 0.1–0.9)
Monocytes Relative: 7 %
NEUTROS ABS: 3.9 10*3/uL (ref 1.5–6.5)
NEUTROS PCT: 64 %
Platelets: 96 10*3/uL — ABNORMAL LOW (ref 145–400)
RBC: 5.58 MIL/uL — AB (ref 3.70–5.45)
RDW: 15.5 % — ABNORMAL HIGH (ref 11.2–14.5)
WBC: 6.1 10*3/uL (ref 3.9–10.3)

## 2018-01-23 LAB — COMPREHENSIVE METABOLIC PANEL
ALBUMIN: 3.9 g/dL (ref 3.5–5.0)
ALK PHOS: 61 U/L (ref 40–150)
ALT: 20 U/L (ref 0–55)
ANION GAP: 7 (ref 3–11)
AST: 21 U/L (ref 5–34)
BILIRUBIN TOTAL: 0.4 mg/dL (ref 0.2–1.2)
BUN: 9 mg/dL (ref 7–26)
CALCIUM: 10.5 mg/dL — AB (ref 8.4–10.4)
CO2: 29 mmol/L (ref 22–29)
Chloride: 105 mmol/L (ref 98–109)
Creatinine, Ser: 0.68 mg/dL (ref 0.60–1.10)
GFR calc non Af Amer: >60 mL/min (ref >60)
Glucose, Bld: 98 mg/dL (ref 70–140)
POTASSIUM: 3.9 mmol/L (ref 3.5–5.1)
Sodium: 141 mmol/L (ref 136–145)
TOTAL PROTEIN: 7.3 g/dL (ref 6.4–8.3)

## 2018-01-23 LAB — RETICULOCYTES
RBC.: 5.58 MIL/uL — AB (ref 3.70–5.45)
RETIC COUNT ABSOLUTE: 78.1 10*3/uL (ref 33.7–90.7)
Retic Ct Pct: 1.4 % (ref 0.7–2.1)

## 2018-01-23 MED ORDER — SERTRALINE HCL 50 MG PO TABS: 50.0000 mg | ORAL_TABLET | Freq: Every day | ORAL | 2 refills | Status: DC

## 2018-01-23 NOTE — Telephone Encounter (Signed)
Appts scheduled AVS/Calendar printed per 4/15 los

## 2018-02-03 ENCOUNTER — Telehealth: Payer: Self-pay | Admitting: *Deleted

## 2018-02-03 ENCOUNTER — Encounter: Payer: Self-pay | Admitting: Hematology

## 2018-02-03 NOTE — Telephone Encounter (Signed)
Received faxed results of mammogram from [facility] done 02/03/18.   Left results on Dr. Ernestina Penna desk for review.

## 2018-02-22 ENCOUNTER — Encounter (HOSPITAL_COMMUNITY): Payer: Self-pay

## 2018-02-22 ENCOUNTER — Telehealth: Payer: Self-pay | Admitting: Adult Health

## 2018-02-22 ENCOUNTER — Ambulatory Visit (HOSPITAL_COMMUNITY)
Admission: RE | Admit: 2018-02-22 | Discharge: 2018-02-22 | Disposition: A | Discharge status: Home / Self Care | Payer: Medicare Other | Source: Ambulatory Visit | Attending: Adult Health | Admitting: Adult Health

## 2018-02-22 ENCOUNTER — Other Ambulatory Visit: Payer: Self-pay | Admitting: Adult Health

## 2018-02-22 DIAGNOSIS — I700 Atherosclerosis of aorta: Secondary | ICD-10-CM | POA: Insufficient documentation

## 2018-02-22 DIAGNOSIS — I712 Thoracic aortic aneurysm, without rupture: Secondary | ICD-10-CM | POA: Insufficient documentation

## 2018-02-22 DIAGNOSIS — Z170 Estrogen receptor positive status [ER+]: Secondary | ICD-10-CM | POA: Diagnosis not present

## 2018-02-22 DIAGNOSIS — I2510 Atherosclerotic heart disease of native coronary artery without angina pectoris: Secondary | ICD-10-CM | POA: Diagnosis not present

## 2018-02-22 DIAGNOSIS — Z720 Tobacco use: Secondary | ICD-10-CM | POA: Insufficient documentation

## 2018-02-22 DIAGNOSIS — Z122 Encounter for screening for malignant neoplasm of respiratory organs: Secondary | ICD-10-CM | POA: Insufficient documentation

## 2018-02-22 DIAGNOSIS — C50212 Malignant neoplasm of upper-inner quadrant of left female breast: Secondary | ICD-10-CM

## 2018-02-22 DIAGNOSIS — J439 Emphysema, unspecified: Secondary | ICD-10-CM | POA: Diagnosis not present

## 2018-02-22 DIAGNOSIS — R918 Other nonspecific abnormal finding of lung field: Secondary | ICD-10-CM

## 2018-02-22 NOTE — Telephone Encounter (Signed)
Called patient and reviewed CT results.  Reviewed that she has aortic atherosclerosis as well. I ordered repeat low dose CT chest in 6 months.  She will receive a call from CT when that time is nearing to have this done.  Scan forwarded to PCP as well.

## 2018-04-05 ENCOUNTER — Ambulatory Visit: Payer: Medicare Other | Admitting: Adult Health

## 2018-04-10 NOTE — Progress Notes (Signed)
[facility]  PATIENT: Monica [name] DOB: [birth year]   REASON FOR VISIT: Follow-up for seizure disorder  HISTORY FROM: Patient   HISTORY OF PRESENT ILLNESS:UPDATE 7/2/2019CM Monica [name], 72 year old female returns for follow-up for seizure disorder.  Last seizure occurred 07/28/2017.  She is currently taking levetiracetam 500 mg twice daily without side effects.  She returns for reevaluation  UPDATE (10/05/17, VRP): Since last visit, doing well. Tolerating LEV 500mg  twice a day. No alleviating or aggravating factors.   PRIOR HPI (08/02/17): 72 year old female here for evaluation of seizures.  History of diabetes, heart disease, breast cancer.  At age late 72 years old patient had a seizure, lips turn blue, no tongue biting or incontinence.  She was apparently treated with antiseizure medication for several years and then discontinued.  May 24, 2017 patient was at [facility], when all of a sudden she collapsed and had a seizure.  Blood glucose was checked within 15 minutes and was 52.  No warning symptoms.  She ate breakfast that morning but no lunch.  She was taken to the hospital and evaluated. July 28, 2017 patient was at home, when all of a sudden she collapsed.  She was found stiff all over, with some convulsions.  Family gave her some juice to drink and blood sugar after testing was 120.  She was confused afterwards.  No tongue biting or incontinence. Patient has diagnosis of breast cancer.  Her first radiation treatment was May 11, 2017.   REVIEW OF SYSTEMS: Full 14 system review of systems performed and notable only for those listed, all others are neg:  Constitutional: Fatigue Cardiovascular: neg Ear/Nose/Throat: neg  Skin: neg Eyes: Light sensitivity  Respiratory: neg Gastroitestinal: neg  Hematology/Lymphatic: neg  Endocrine: neg Musculoskeletal:neg Allergy/Immunology: neg Neurological: Seizure disorder Psychiatric: neg Sleep :  neg   ALLERGIES: Allergies  Allergen Reactions  . Ace Inhibitors Dermatitis and Cough  . Keflex [Cephalexin] Itching  . Phenobarbital Other (See Comments)    Black spots on hands and feet.  . Sulfate Nausea And Vomiting and Other (See Comments)    Dizziness   . Altace [Ramipril] Other (See Comments)    UNSPECIFIED REACTION      HOME MEDICATIONS: Outpatient Medications Prior to Visit  Medication Sig Dispense Refill  . acetaminophen (TYLENOL) 500 MG tablet Take 1,000 mg by mouth every 6 (six) hours as needed for moderate pain or headache.    . anastrozole (ARIMIDEX) 1 MG tablet Take 1 tablet (1 mg total) daily by mouth. 90 tablet 1  . aspirin EC 81 MG tablet Take 81 mg by mouth at bedtime.    Marland Kitchen atorvastatin (LIPITOR) 40 MG tablet Take 40 mg by mouth at bedtime.    . Calcium Carbonate-Vitamin D 600-400 MG-UNIT per tablet Take 1 tablet by mouth 2 (two) times daily.    . carvedilol (COREG) 3.125 MG tablet Take 3.125 mg by mouth 2 (two) times daily.     . clonazePAM (KLONOPIN) 1 MG tablet Take 1 mg by mouth at bedtime.     Marland Kitchen glimepiride (AMARYL) 4 MG tablet Take 4 mg by mouth 2 (two) times daily.    . halobetasol (ULTRAVATE) 0.05 % cream Apply 1 application topically 2 (two) times daily as needed (rash on leg). Applied to rash on leg (ankle area)    . Insulin Detemir (LEVEMIR) 100 UNIT/ML Pen Inject 17-25 Units into the skin 2 (two) times daily. 25 units in the morning & 17 units at night    .  JANUVIA 100 MG tablet Take 100 mg by mouth at bedtime.     Marland Kitchen JARDIANCE 10 MG TABS tablet Take 10 mg by mouth daily.    Marland Kitchen levETIRAcetam (KEPPRA) 500 MG tablet Take 1 tablet (500 mg total) by mouth 2 (two) times daily. 180 tablet 4  . losartan (COZAAR) 50 MG tablet Take 50 mg by mouth at bedtime.     . metFORMIN (GLUCOPHAGE-XR) 500 MG 24 hr tablet Take 500 mg by mouth 2 (two) times daily.    . Multiple Vitamin (MULTIVITAMIN WITH MINERALS) TABS tablet Take 1 tablet by mouth at bedtime.    . Omega-3  Fatty Acids (FISH OIL) 1200 MG CAPS Take 2,400 mg by mouth 2 (two) times daily.    Marland Kitchen omeprazole (PRILOSEC) 20 MG capsule Take 20 mg by mouth at bedtime.     . pioglitazone (ACTOS) 30 MG tablet Take 30 mg by mouth at bedtime.    Vladimir Faster Glycol-Propyl Glycol (SYSTANE) 0.4-0.3 % SOLN Place 1 drop into both eyes 2 (two) times daily as needed (dry eyes).     . risedronate (ACTONEL) 150 MG tablet Take 150 mg by mouth every 30 (thirty) days.    Marland Kitchen sertraline (ZOLOFT) 100 MG tablet Take 100 mg by mouth at bedtime.    . nitroGLYCERIN (NITROSTAT) 0.4 MG SL tablet Place 1 tablet (0.4 mg total) under the tongue every 5 (five) minutes as needed for chest pain. 25 tablet 1  . sertraline (ZOLOFT) 50 MG tablet Take 1 tablet (50 mg total) by mouth daily. (Patient not taking: Reported on 04/11/2018) 30 tablet 2   No facility-administered medications prior to visit.     PAST MEDICAL HISTORY: Past Medical History:  Diagnosis Date  . CAD (coronary artery disease)    Cypher stent 09/2002  . Cancer [facility]) 2018   left breast, radiation therapy  . CHF (congestive heart failure) ([place])   . Diabetes mellitus ([place])   . Hyperlipidemia   . Hypertension   . Myocardial infarction ([place])    2003,went into cardiac shock  . Osteoporosis   . Seizures ([place])    1st seizure age late 86's; most recent 07/28/17  . Sinusitis     PAST SURGICAL HISTORY: Past Surgical History:  Procedure Laterality Date  . ABDOMINAL HYSTERECTOMY    . APPENDECTOMY    . BREAST LUMPECTOMY WITH RADIOACTIVE SEED AND SENTINEL LYMPH NODE BIOPSY Left 03/16/2017   Procedure: INJECT BLUE DYE LEFT BREAST, LEFT BREAST LUMPECTOMY WITH RADIOACTIVE SEED AND LEFT AXILLARY DEEP SENTINEL LYMPH NODE  BIOPSY ADJACENT TISSUE TRANSFER;  Surgeon: [name], MD;  Location: [place];  Service: General;  Laterality: Left;  . CARPAL TUNNEL RELEASE Bilateral   . COLONOSCOPY WITH PROPOFOL N/A 05/30/2015   Procedure: COLONOSCOPY WITH PROPOFOL;  Surgeon: Carol Ada,  MD;  Location: [facility];  Service: Endoscopy;  Laterality: N/A;  . CORONARY ANGIOPLASTY WITH STENT PLACEMENT  10-02-2002  . Exploratory abdominal     Bleeding in abdomen after tubal  . TUBAL LIGATION    . UMBILICAL HERNIA REPAIR      FAMILY HISTORY: Family History  Problem Relation Age of Onset  . Dementia Mother   . Kidney disease Mother   . Heart attack Father 41       Died age 41  . Heart attack Brother 64  . Heart attack Brother 26    SOCIAL HISTORY: Social History   Socioeconomic History  . Marital status: Married    Spouse name: Sonia Side  .  Number of children: 3  . Years of education: 4  . Highest education level: Not on file  Occupational History  . Not on file  Social Needs  . Financial resource strain: Not on file  . Food insecurity:    Worry: Not on file    Inability: Not on file  . Transportation needs:    Medical: Not on file    Non-medical: Not on file  Tobacco Use  . Smoking status: Current Every Day Smoker    Packs/day: 2.00    Years: 30.00    Pack years: 60.00    Types: Cigarettes    Last attempt to quit: 03/09/2017    Years since quitting: 1.0  . Smokeless tobacco: Never Used  . Tobacco comment: e cig, 08/02/17 1 PPD  Substance and Sexual Activity  . Alcohol use: No    Alcohol/week: 0.0 oz  . Drug use: No  . Sexual activity: Not on file  Lifestyle  . Physical activity:    Days per week: Not on file    Minutes per session: Not on file  . Stress: Not on file  Relationships  . Social connections:    Talks on phone: Not on file    Gets together: Not on file    Attends religious service: Not on file    Active member of club or organization: Not on file    Attends meetings of clubs or organizations: Not on file    Relationship status: Not on file  . Intimate partner violence:    Fear of current or ex partner: Not on file    Emotionally abused: Not on file    Physically abused: Not on file    Forced sexual activity: Not on file  Other  Topics Concern  . Not on file  Social History Narrative   Lives with husband.    Caffeine-      PHYSICAL EXAM  Vitals:   04/11/18 1052  BP: (!) 102/64  Pulse: (!) 51  Weight: 116 lb 12.8 oz (53 kg)  Height: 5\' 5"  (1.651 m)   Body mass index is 19.44 kg/m.  Generalized: Well developed, in no acute distress  Head: normocephalic and atraumatic,. Oropharynx benign  Neck: Supple, Musculoskeletal: No deformity   Neurological examination   Mentation: Alert oriented to time, place, history taking. Attention span and concentration appropriate. Recent and remote memory intact.  Follows all commands speech and language fluent.   Cranial nerve II-XII: .Pupils were equal round reactive to light extraocular movements were full, visual field were full on confrontational test. Facial sensation and strength were normal. hearing was intact to finger rubbing bilaterally. Uvula tongue midline. head turning and shoulder shrug were normal and symmetric.Tongue protrusion into cheek strength was normal. Motor: normal bulk and tone, full strength in the BUE, BLE,  Sensory: normal and symmetric to light touch,  Coordination: finger-nose-finger, heel-to-shin bilaterally, no dysmetria Reflexes: Symmetric upper and lower plantar responses were flexor bilaterally. Gait and Station: Rising up from seated position without assistance, normal stance,  moderate stride, good arm swing, smooth turning, able to perform tiptoe, and heel walking without difficulty. Tandem gait is steady  DIAGNOSTIC DATA (LABS, IMAGING, TESTING) - I reviewed patient records, labs, notes, testing and imaging myself where available.  Lab Results  Component Value Date   WBC 6.1 01/23/2018   HGB 14.4 01/23/2018   HCT 44.0 01/23/2018   MCV 78.9 (L) 01/23/2018   PLT 96 (L) 01/23/2018      Component  Value Date/Time   NA 141 01/23/2018 1159   NA 139 08/25/2017 1224   K 3.9 01/23/2018 1159   K 4.3 08/25/2017 1224   CL 105  01/23/2018 1159   CO2 29 01/23/2018 1159   CO2 26 08/25/2017 1224   GLUCOSE 98 01/23/2018 1159   GLUCOSE 149 (H) 08/25/2017 1224   BUN 9 01/23/2018 1159   BUN 10.1 08/25/2017 1224   CREATININE 0.68 01/23/2018 1159   CREATININE 0.7 08/25/2017 1224   CALCIUM 10.5 (H) 01/23/2018 1159   CALCIUM 9.4 08/25/2017 1224   PROT 7.3 01/23/2018 1159   PROT 7.2 08/25/2017 1224   ALBUMIN 3.9 01/23/2018 1159   ALBUMIN 4.0 08/25/2017 1224   AST 21 01/23/2018 1159   AST 24 08/25/2017 1224   ALT 20 01/23/2018 1159   ALT 33 08/25/2017 1224   ALKPHOS 61 01/23/2018 1159   ALKPHOS 72 08/25/2017 1224   BILITOT 0.4 01/23/2018 1159   BILITOT 0.49 08/25/2017 1224   GFRNONAA >60 01/23/2018 1159   GFRAA >60 01/23/2018 1159    Lab Results  Component Value Date   HGBA1C 8.5 (H) 03/09/2017    ASSESSMENT AND PLAN  72 y.o. year old female here with 3 seizures in life.  One seizure may have been associated with hypoglycemia.  The other 2 seizures were not associated with any triggering factors.  Last seizure 07/28/17. PLAN:Continue Keppra 500 mg twice a day  Call for any seizure activity Stay well-hydrated Last seizure 07/28/17. Follow up 6 months I spent 15 minutes of face to face time with patient. Greater than 50% of time was spent in counseling and coordination of care with patient.  Dennie Bible, [facility], [facility], APRN  [facility] [address], [place], [place] [address] [phone]

## 2018-04-11 ENCOUNTER — Ambulatory Visit: Payer: Medicare Other | Admitting: Nurse Practitioner

## 2018-04-11 ENCOUNTER — Encounter: Payer: Self-pay | Admitting: Nurse Practitioner

## 2018-04-11 DIAGNOSIS — G40909 Epilepsy, unspecified, not intractable, without status epilepticus: Secondary | ICD-10-CM | POA: Insufficient documentation

## 2018-04-11 NOTE — Patient Instructions (Signed)
Continue Keppra 500 mg twice a day  Call for any seizure activity Last seizure 07/28/17. Follow up 6 months

## 2018-04-11 NOTE — Progress Notes (Signed)
I reviewed note and agree with plan.   Penni Bombard, MD 10/19/4710, 52:71 AM Certified in Neurology, Neurophysiology and Neuroimaging  [facility] [address] [place], [place] [address] [phone]

## 2018-04-20 ENCOUNTER — Other Ambulatory Visit: Payer: Self-pay | Admitting: Hematology

## 2018-04-20 DIAGNOSIS — E2839 Other primary ovarian failure: Secondary | ICD-10-CM

## 2018-04-26 NOTE — Progress Notes (Signed)
Cardiology Office Note   Date:  04/27/2018   ID:  Monica [name], DOB 1945/11/10, MRN [id number]  PCP:  Jani Gravel, MD  Cardiologist:   Minus Breeding, MD   Chief Complaint  Patient presents with  . Coronary Artery Disease      History of Present Illness: Monica [name] is a 72 y.o. female who presents for evaluation of coronary artery disease. She had previous stenting to an LAD. This was in 2003 with DES.  In 2016  POET (Plain Old Exercise Treadmill) was negative.  She returns for follow up.  Since I last saw her she is done well from a cardiovascular standpoint.  She states she had one episode of low blood sugar and seizures related to this.  She cleans her house includes vacuuming.  She walks her dog.  With this she denies any cardiovascular symptoms. The patient denies any new symptoms such as chest discomfort, neck or arm discomfort. There has been no new shortness of breath, PND or orthopnea. There have been no reported palpitations, presyncope or syncope.    Past Medical History:  Diagnosis Date  . CAD (coronary artery disease)    Cypher stent 09/2002  . Cancer [facility]) 2018   left breast, radiation therapy  . CHF (congestive heart failure) ([place])   . Depression   . Diabetes mellitus ([place])   . Hyperlipidemia   . Hypertension   . Myocardial infarction ([place])    2003,went into cardiac shock  . Osteoporosis   . Seizures ([place])    1st seizure age late 89's; most recent 07/28/17  . Sinusitis     Past Surgical History:  Procedure Laterality Date  . ABDOMINAL HYSTERECTOMY    . APPENDECTOMY    . BREAST LUMPECTOMY WITH RADIOACTIVE SEED AND SENTINEL LYMPH NODE BIOPSY Left 03/16/2017   Procedure: INJECT BLUE DYE LEFT BREAST, LEFT BREAST LUMPECTOMY WITH RADIOACTIVE SEED AND LEFT AXILLARY DEEP SENTINEL LYMPH NODE  BIOPSY ADJACENT TISSUE TRANSFER;  Surgeon: [name], MD;  Location: [place];  Service: General;  Laterality: Left;  . CARPAL TUNNEL RELEASE Bilateral   .  COLONOSCOPY WITH PROPOFOL N/A 05/30/2015   Procedure: COLONOSCOPY WITH PROPOFOL;  Surgeon: Carol Ada, MD;  [facility];  Service: Endoscopy;  Laterality: N/A;  . CORONARY ANGIOPLASTY WITH STENT PLACEMENT  10-02-2002  . Exploratory abdominal     Bleeding in abdomen after tubal  . TUBAL LIGATION    . UMBILICAL HERNIA REPAIR       Current Outpatient Medications  Medication Sig Dispense Refill  . acetaminophen (TYLENOL) 500 MG tablet Take 1,000 mg by mouth every 6 (six) hours as needed for moderate pain or headache.    . anastrozole (ARIMIDEX) 1 MG tablet Take 1 tablet (1 mg total) daily by mouth. 90 tablet 1  . anastrozole (ARIMIDEX) 1 MG tablet TAKE 1 TABLET BY MOUTH DAILY 30 tablet 3  . aspirin EC 81 MG tablet Take 81 mg by mouth at bedtime.    Marland Kitchen atorvastatin (LIPITOR) 40 MG tablet Take 40 mg by mouth at bedtime.    . Calcium Carbonate-Vitamin D 600-400 MG-UNIT per tablet Take 1 tablet by mouth 2 (two) times daily.    . carvedilol (COREG) 3.125 MG tablet Take 3.125 mg by mouth 2 (two) times daily.     . clonazePAM (KLONOPIN) 1 MG tablet Take 1 mg by mouth at bedtime.     Marland Kitchen glimepiride (AMARYL) 4 MG tablet Take 4 mg by mouth 2 (two)  times daily.    . halobetasol (ULTRAVATE) 0.05 % cream Apply 1 application topically 2 (two) times daily as needed (rash on leg). Applied to rash on leg (ankle area)    . Insulin Detemir (LEVEMIR) 100 UNIT/ML Pen Inject 17-25 Units into the skin 2 (two) times daily. 25 units in the morning & 17 units at night    . JANUVIA 100 MG tablet Take 100 mg by mouth at bedtime.     Marland Kitchen JARDIANCE 10 MG TABS tablet Take 10 mg by mouth daily.    Marland Kitchen levETIRAcetam (KEPPRA) 500 MG tablet Take 1 tablet (500 mg total) by mouth 2 (two) times daily. 180 tablet 4  . losartan (COZAAR) 50 MG tablet Take 50 mg by mouth at bedtime.     . metFORMIN (GLUCOPHAGE-XR) 500 MG 24 hr tablet Take 500 mg by mouth 2 (two) times daily.    . Multiple Vitamin (MULTIVITAMIN WITH MINERALS)  TABS tablet Take 1 tablet by mouth at bedtime.    . Omega-3 Fatty Acids (FISH OIL) 1200 MG CAPS Take 2,400 mg by mouth 2 (two) times daily.    Marland Kitchen omeprazole (PRILOSEC) 20 MG capsule Take 20 mg by mouth at bedtime.     . pioglitazone (ACTOS) 30 MG tablet Take 30 mg by mouth at bedtime.    Vladimir Faster Glycol-Propyl Glycol (SYSTANE) 0.4-0.3 % SOLN Place 1 drop into both eyes 2 (two) times daily as needed (dry eyes).     . risedronate (ACTONEL) 150 MG tablet Take 150 mg by mouth every 30 (thirty) days.    Marland Kitchen sertraline (ZOLOFT) 100 MG tablet Take 100 mg by mouth at bedtime.    . nitroGLYCERIN (NITROSTAT) 0.4 MG SL tablet Place 1 tablet (0.4 mg total) under the tongue every 5 (five) minutes as needed for chest pain. 25 tablet 1   No current facility-administered medications for this visit.     Allergies:   Ace inhibitors; Keflex [cephalexin]; Phenobarbital; Sulfate; and Altace [ramipril]    ROS:  Please see the history of present illness.   Otherwise, review of systems are positive for none.   All other systems are reviewed and negative.    PHYSICAL EXAM: VS:  BP 108/62 (BP Location: Left Arm, Patient Position: Sitting, Cuff Size: Normal)   Pulse (!) 59   Ht 5\' 5"  (1.651 m)   Wt 115 lb (52.2 kg)   BMI 19.14 kg/m  , BMI Body mass index is 19.14 kg/m.  GENERAL:  Well appearing NECK:  No jugular venous distention, waveform within normal limits, carotid upstroke brisk and symmetric, no bruits, no thyromegaly LUNGS:  Clear to auscultation bilaterally CHEST:  Unremarkable HEART:  PMI not displaced or sustained,S1 and S2 within normal limits, no S3, no S4, no clicks, no rubs, no murmurs ABD:  Flat, positive bowel sounds normal in frequency in pitch, no bruits, no rebound, no guarding, no midline pulsatile mass, no hepatomegaly, no splenomegaly EXT:  2 plus pulses throughout, no edema, no cyanosis no clubbing   EKG:  EKG is  ordered today. Sinus rhythm, rate 59, right bundle branch block, no  acute ST-T wave changes.  No change from previous.  Recent Labs:    Lab Results  Component Value Date   HGBA1C 8.5 (H) 03/09/2017    Wt Readings from Last 3 Encounters:  04/27/18 115 lb (52.2 kg)  04/11/18 116 lb 12.8 oz (53 kg)  01/23/18 116 lb 8 oz (52.8 kg)      Other studies Reviewed:  Additional studies/ records that were reviewed today include: None Review of the above records demonstrates:     ASSESSMENT AND PLAN:  CAD:   The patient has no new sypmtoms.  No further cardiovascular testing is indicated.  We will continue with aggressive risk reduction and meds as listed.  DYSLIPIDEMIA:    LDL was 51 with HDL 32 in April.  Continue current therapy.   TOBACCO:     She is using e cigs (which I cannot endorse).  However, I strongly encourage her to stop smoking.   DM:    A1c was 7.  Continue current therapy.   PROLONGED QT:    QT is borderline but not prolonged.   This was present on the previous EKG but has normalized on today's   Current medicines are reviewed at length with the patient today.  The patient does not have concerns regarding medicines.  The following changes have been made:  None  Labs/ tests ordered today include:  None  Orders Placed This Encounter  Procedures  . EKG 12-Lead     Disposition:   FU with me in one year.     Signed, Minus Breeding, MD  04/27/2018 12:29 PM    [facility]

## 2018-04-27 ENCOUNTER — Encounter: Payer: Self-pay | Admitting: Cardiology

## 2018-04-27 ENCOUNTER — Ambulatory Visit: Payer: Medicare Other | Admitting: Cardiology

## 2018-04-27 VITALS — BP 108/62 | HR 59 | Ht 65.0 in | Wt 115.0 lb

## 2018-04-27 DIAGNOSIS — E118 Type 2 diabetes mellitus with unspecified complications: Secondary | ICD-10-CM | POA: Diagnosis not present

## 2018-04-27 DIAGNOSIS — E785 Hyperlipidemia, unspecified: Secondary | ICD-10-CM

## 2018-04-27 DIAGNOSIS — I2510 Atherosclerotic heart disease of native coronary artery without angina pectoris: Secondary | ICD-10-CM | POA: Diagnosis not present

## 2018-04-27 DIAGNOSIS — Z720 Tobacco use: Secondary | ICD-10-CM | POA: Diagnosis not present

## 2018-04-27 NOTE — Patient Instructions (Signed)
Medication Instructions:  Continue current medications  If you need a refill on your cardiac medications before your next appointment, please call your pharmacy.  Labwork: None Ordered   Testing/Procedures: None Ordered  Follow-Up: Your physician wants you to follow-up in: 1 Year. You should receive a reminder letter in the mail two months in advance. If you do not receive a letter, please call our office [phone].    Thank you for choosing [facility]!!      

## 2018-05-22 NOTE — Progress Notes (Signed)
[place]  [phone] Fax:(336) [phone]  Clinic Follow-up Note   Patient Care Team: Jani Gravel, MD as PCP - General (Internal Medicine) [name], MD as Consulting Physician (General Surgery) Truitt Merle, MD as Consulting Physician (Hematology) Kyung Rudd, MD as Consulting Physician (Radiation Oncology) Gardenia Phlegm, NP as Nurse Practitioner (Hematology and Oncology) 05/25/2018  CHIEF COMPLAINTS:  Follow up left Breast cancer   Oncology History   Cancer Staging Malignant neoplasm of upper-inner quadrant of left breast in female, estrogen receptor positive ([place]) Staging form: Breast, AJCC 8th Edition - Clinical stage from 02/16/2017: Stage IA (cT1b, cN0, cM0, G2, ER: Positive, PR: Positive, HER2: Negative) - Unsigned Staging comments: Staged at breast conference  - Pathologic stage from 03/16/2017: Stage IA (pT1b, pN0, cM0, G2, ER: Positive, PR: Positive, HER2: Negative, Oncotype DX score: 27) - Signed by Truitt Merle, MD on 04/24/2017       Malignant neoplasm of upper-inner quadrant of left breast in female, estrogen receptor positive ([place])   02/09/2017 Initial Diagnosis    Malignant neoplasm of upper-inner quadrant of left breast in female, estrogen receptor positive ([place])    02/09/2017 Initial Biopsy    Diagnosis Breast, left, needle core biopsy - INVASIVE DUCTAL CARCINOMA, 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 Dalbert Batman     03/16/2017 Pathology Results    PATHOLOGY REPORT Diagnosis 03/16/17 1. Breast, lumpectomy, Left - INVASIVE DUCTAL CARCINOMA, NOTTINGHAM GRADE 2 OF 3, 0.9 CM - DUCTAL CARCINOMA IN  SITU - MARGINS UNINVOLVED BY CARCINOMA (0.3 CM POSTERIOR MARGIN) - PREVIOUS BIOPSY SITE CHANGES - SEE ONCOLOGY TABLE BELOW 2. Lymph node, sentinel, biopsy, Left axillary #1 - NO CARCINOMA IDENTIFIED IN ONE LYMPH NODE (0/1) 3. Lymph node, sentinel, biopsy, Left axillary #2 - NO CARCINOMA IDENTIFIED IN ONE LYMPH NODE (0/1) 4. Lymph node, sentinel, biopsy, Left axillary #3 - NO CARCINOMA IDENTIFIED IN ONE LYMPH NODE (0/1) 5. Lymph node, sentinel, biopsy, Left axillary #4 - NO CARCINOMA IDENTIFIED IN ONE LYMPH NODE (0/1)     03/16/2017 Oncotype testing    Recurrance score of 27 with a 18% distance recurrance in the net 10 years with Tamoxifen alone     05/19/2017 - 06/16/2017 Radiation Therapy    Radiation with Dr. Lisbeth Renshaw    06/2017 -  Anti-estrogen oral therapy    anastrozole 1 mg once a day starting late 06/2017        HISTORY OF PRESENTING ILLNESS: 02/16/17 Monica [name] 72 y.o. female is here because of newly diagnosed left breast cancer. She is accompanied by her multiple family members to our disciplinary breast clinic today.  This was found by a mammogram screening of the left breast. She had a abnormal benign mammogram 35 years ago and had surgery to remove the mass form the left breast. She denies feeling a lump herself and denies any change lately. She reports having a fair appetite and no weight loss. She ranges 124/127 pounds. She denies and pain and aches but has pain in her left knee joint but tolerable as well in her hip. She takes tylenol for pain. She has a history of DM and follows up with Dr. Maudie Mercury. Her  sugar has been running high lightly. Had a CHF and MI in 2003 and has a stent.She denies chest pain and her last echo was in 2016 and is normal. She has not had a stroke. No history of breast cancer in family. She still smokes.   Her most exercise is walking her dog and she remain moving around the house. She will see her PCP next week   GYN HISTORY  Menarchal: 11 LMP: many  years ago, not sure  Contraceptive: none  HRT: none  G3P3: She was 25 at first live births.   CURRENT THERAPY: anastrozole 1 mg once a day starting 06/2017    INTERVAL HISTORY: Monica [name] is here for a follow up. She is here alone. She is doing well. She denies hot flashes. Her hand joints are sometimes stiff in the morning. Her left knee is sore sometimes, but it's not affecting her function.  She is on calcium pills twice a day. She is trying to cut back on smoking    MEDICAL HISTORY:  Past Medical History:  Diagnosis Date  . CAD (coronary artery disease)    Cypher stent 09/2002  . Cancer [facility]) 2018   left breast, radiation therapy  . CHF (congestive heart failure) ([place])   . Depression   . Diabetes mellitus ([place])   . Hyperlipidemia   . Hypertension   . Myocardial infarction ([place])    2003,went into cardiac shock  . Osteoporosis   . Seizures ([place])    1st seizure age late 44's; most recent 07/28/17  . Sinusitis     SURGICAL HISTORY: Past Surgical History:  Procedure Laterality Date  . ABDOMINAL HYSTERECTOMY    . APPENDECTOMY    . BREAST LUMPECTOMY WITH RADIOACTIVE SEED AND SENTINEL LYMPH NODE BIOPSY Left 03/16/2017   Procedure: INJECT BLUE DYE LEFT BREAST, LEFT BREAST LUMPECTOMY WITH RADIOACTIVE SEED AND LEFT AXILLARY DEEP SENTINEL LYMPH NODE  BIOPSY ADJACENT TISSUE TRANSFER;  Surgeon: [name], MD;  Location: [place];  Service: General;  Laterality: Left;  . CARPAL TUNNEL RELEASE Bilateral   . COLONOSCOPY WITH PROPOFOL N/A 05/30/2015   Procedure: COLONOSCOPY WITH PROPOFOL;  Surgeon: Carol Ada, MD;  Location[facility];  Service: Endoscopy;  Laterality: N/A;  . CORONARY ANGIOPLASTY WITH STENT PLACEMENT  10-02-2002  . Exploratory abdominal     Bleeding in abdomen after tubal  . TUBAL LIGATION    . UMBILICAL HERNIA REPAIR      SOCIAL HISTORY: Social History   Socioeconomic History  . Marital status: Married    Spouse name: Sonia Side  . Number of  children: 3  . Years of education: 70  . Highest education level: Not on file  Occupational History  . Not on file  Social Needs  . Financial resource strain: Not on file  . Food insecurity:    Worry: Not on file    Inability: Not on file  . Transportation needs:    Medical: Not on file    Non-medical: Not on file  Tobacco Use  . Smoking status: Current Every Day Smoker    Packs/day: 2.00    Years: 30.00    Pack years: 60.00    Types: Cigarettes    Last attempt to quit: 03/09/2017    Years since quitting: 1.2  . Smokeless tobacco: Never Used  . Tobacco comment: e cig, 08/02/17 1 PPD  Substance and Sexual Activity  . Alcohol use: No    Alcohol/week: 0.0 standard drinks  . Drug use: No  .  Sexual activity: Not on file  Lifestyle  . Physical activity:    Days per week: Not on file    Minutes per session: Not on file  . Stress: Not on file  Relationships  . Social connections:    Talks on phone: Not on file    Gets together: Not on file    Attends religious service: Not on file    Active member of club or organization: Not on file    Attends meetings of clubs or organizations: Not on file    Relationship status: Not on file  . Intimate partner violence:    Fear of current or ex partner: Not on file    Emotionally abused: Not on file    Physically abused: Not on file    Forced sexual activity: Not on file  Other Topics Concern  . Not on file  Social History Narrative   Lives with husband.    Caffeine-     FAMILY HISTORY: Family History  Problem Relation Age of Onset  . Dementia Mother   . Kidney disease Mother   . Heart attack Father 6       Died age 5  . Heart attack Brother 69  . Heart attack Brother 61    ALLERGIES:  is allergic to ace inhibitors; keflex [cephalexin]; phenobarbital; sulfate; and altace [ramipril].  MEDICATIONS:  Current Outpatient Medications  Medication Sig Dispense Refill  . acetaminophen (TYLENOL) 500 MG tablet Take 1,000 mg by  mouth every 6 (six) hours as needed for moderate pain or headache.    . anastrozole (ARIMIDEX) 1 MG tablet Take 1 tablet (1 mg total) daily by mouth. 90 tablet 1  . anastrozole (ARIMIDEX) 1 MG tablet TAKE 1 TABLET BY MOUTH DAILY 30 tablet 3  . aspirin EC 81 MG tablet Take 81 mg by mouth at bedtime.    Marland Kitchen atorvastatin (LIPITOR) 40 MG tablet Take 40 mg by mouth at bedtime.    . Calcium Carbonate-Vitamin D 600-400 MG-UNIT per tablet Take 1 tablet by mouth 2 (two) times daily.    . carvedilol (COREG) 3.125 MG tablet Take 3.125 mg by mouth 2 (two) times daily.     . clonazePAM (KLONOPIN) 1 MG tablet Take 1 mg by mouth at bedtime.     Marland Kitchen glimepiride (AMARYL) 4 MG tablet Take 4 mg by mouth 2 (two) times daily.    . halobetasol (ULTRAVATE) 0.05 % cream Apply 1 application topically 2 (two) times daily as needed (rash on leg). Applied to rash on leg (ankle area)    . Insulin Detemir (LEVEMIR) 100 UNIT/ML Pen Inject 17-25 Units into the skin 2 (two) times daily. 25 units in the morning & 17 units at night    . JANUVIA 100 MG tablet Take 100 mg by mouth at bedtime.     Marland Kitchen JARDIANCE 10 MG TABS tablet Take 10 mg by mouth daily.    Marland Kitchen levETIRAcetam (KEPPRA) 500 MG tablet Take 1 tablet (500 mg total) by mouth 2 (two) times daily. 180 tablet 4  . losartan (COZAAR) 50 MG tablet Take 50 mg by mouth at bedtime.     . metFORMIN (GLUCOPHAGE-XR) 500 MG 24 hr tablet Take 500 mg by mouth 2 (two) times daily.    . Multiple Vitamin (MULTIVITAMIN WITH MINERALS) TABS tablet Take 1 tablet by mouth at bedtime.    . Omega-3 Fatty Acids (FISH OIL) 1200 MG CAPS Take 2,400 mg by mouth 2 (two) times daily.    Marland Kitchen  omeprazole (PRILOSEC) 20 MG capsule Take 20 mg by mouth at bedtime.     . pioglitazone (ACTOS) 30 MG tablet Take 30 mg by mouth at bedtime.    Vladimir Faster Glycol-Propyl Glycol (SYSTANE) 0.4-0.3 % SOLN Place 1 drop into both eyes 2 (two) times daily as needed (dry eyes).     . risedronate (ACTONEL) 150 MG tablet Take 150 mg by  mouth every 30 (thirty) days.    Marland Kitchen sertraline (ZOLOFT) 100 MG tablet Take 100 mg by mouth at bedtime.    . nitroGLYCERIN (NITROSTAT) 0.4 MG SL tablet Place 1 tablet (0.4 mg total) under the tongue every 5 (five) minutes as needed for chest pain. 25 tablet 1   No current facility-administered medications for this visit.     REVIEW OF SYSTEMS:   Constitutional: Denies fevers, chills or abnormal night sweats  Eyes: Denies blurriness of vision, double vision or watery eyes Ears, nose, mouth, throat, and face: Denies mucositis or sore throat Respiratory: Denies cough, dyspnea or wheezes Cardiovascular: Denies palpitation, chest discomfort or lower extremity swelling Gastrointestinal:  Denies nausea, heartburn or change in bowel habits MSK:(+) hand joint stiffness (+) left knee pain Skin: Denies abnormal skin rashes Lymphatics: Denies new lymphadenopathy or easy bruising Neurological:Denies numbness, tingling or new weaknesses Behavioral/Psych: Mood is stable, no new changes  All other systems were reviewed with the patient and are negative. Breast: (+)Mild tenderness post radiation/surgery  PHYSICAL EXAMINATION: ECOG PERFORMANCE STATUS: 0 - Asymptomatic Pulse 67   Temp 98 F (36.7 C) (Oral)   Resp 18   Ht 5' 5" (1.651 m)   Wt 116 lb 4.8 oz (52.8 kg)   SpO2 95%   BMI 19.35 kg/m   GENERAL:alert, no distress and comfortable SKIN: skin color, texture, turgor are normal, no rashes or significant lesions EYES: normal, conjunctiva are pink and non-injected, sclera clear OROPHARYNX:no exudate, no erythema and lips, buccal mucosa, and tongue normal  NECK: supple, thyroid normal size, non-tender, without nodularity LYMPH:  no palpable lymphadenopathy in the cervical, axillary or inguinal LUNGS: clear to auscultation and percussion with normal breathing effort HEART: regular rate & rhythm and no murmurs and no lower extremity edema ABDOMEN: abdomen soft, non-tender and normal bowel  sounds Musculoskeletal:no cyanosis of digits and no clubbing  PSYCH: alert & oriented x 3 with fluent speech NEURO: no focal motor/sensory deficits Breasts: (+) Breast inspection showed them to be symmetrical with no nipple discharge. Palpation of the breasts and axilla revealed no obvious mass that I could appreciate. (+) left UOQ and left axilla incisions, healed well with mild tenderness. No palpable mass.  LABORATORY DATA:  I have reviewed the data as listed CBC Latest Ref Rng & Units 05/25/2018 01/23/2018 11/26/2017  WBC 3.9 - 10.3 K/uL 5.4 6.1 5.2  Hemoglobin 11.6 - 15.9 g/dL 15.6 14.4 14.2  Hematocrit 34.8 - 46.6 % 47.4(H) 44.0 42.9  Platelets 145 - 400 K/uL 97(L) 96(L) 78(L)    CMP Latest Ref Rng & Units 05/25/2018 01/23/2018 11/26/2017  Glucose 70 - 99 mg/dL 160(H) 98 103(H)  BUN 8 - 23 mg/dL _0 Creatinine 0.44 - 1.00 mg/dL 0.70 0.68 0.56  Sodium 135 - 145 mmol/L 140 141 137  Potassium 3.5 - 5.1 mmol/L 4.2 3.9 3.8  Chloride 98 - 111 mmol/L 102 105 101  CO2 22 - 32 mmol/L _1 Calcium 8.9 - 10.3 mg/dL 10.5(H) 10.5(H) 9.0  Total Protein 6.5 - 8.1 g/dL 7.3 7.3 -  Total Bilirubin  0.3 - 1.2 mg/dL 0.6 0.4 -  Alkaline Phos 38 - 126 U/L 69 61 -  AST 15 - 41 U/L 23 21 -  ALT 0 - 44 U/L 22 20 -    PATHOLOGY REPORT  Diagnosis 03/16/17 1. Breast, lumpectomy, Left - INVASIVE DUCTAL CARCINOMA, NOTTINGHAM GRADE 2 OF 3, 0.9 CM - DUCTAL CARCINOMA IN SITU - MARGINS UNINVOLVED BY CARCINOMA (0.3 CM POSTERIOR MARGIN) - PREVIOUS BIOPSY SITE CHANGES - SEE ONCOLOGY TABLE BELOW 2. Lymph node, sentinel, biopsy, Left axillary #1 - NO CARCINOMA IDENTIFIED IN ONE LYMPH NODE (0/1) 3. Lymph node, sentinel, biopsy, Left axillary #2 - NO CARCINOMA IDENTIFIED IN ONE LYMPH NODE (0/1) 4. Lymph node, sentinel, biopsy, Left axillary #3 - NO CARCINOMA IDENTIFIED IN ONE LYMPH NODE (0/1) 5. Lymph node, sentinel, biopsy, Left axillary #4 - NO CARCINOMA IDENTIFIED IN ONE LYMPH NODE (0/1) Microscopic  Comment 1. BREAST, INVASIVE TUMOR Procedure: Excision Laterality: Left Tumor Size: 0.9 x 0.8 x 0.7 cm Histologic Type: Invasive carcinoma of no special type (ductal, not otherwise specified) Grade: Nottingham Grade 2 Tubular Differentiation: 2 Nuclear Pleomorphism: 2 Mitotic Count: 2 Ductal Carcinoma in Situ (DCIS): Present (solid, cribriform patterns) Margins: Uninvolved by carcinoma Invasive carcinoma, distance from closest margin: 0.3 cm (posterior) DCIS, distance from closest margin: 0.4 cm (superior) Regional Lymph Nodes: Number of Lymph Nodes Examined: 4 Microscopic Comment(continued) Number of Sentinel Lymph Nodes Examined: 4 Lymph Nodes with Macrometastases: 0 Lymph Nodes with Micrometastases: 0 Lymph Nodes with Isolated Tumor Cells: 0 Breast Prognostic Profile: See [id number] (02/10/2017) Estrogen Receptor: Positive (100%, Strong) Progesterone Receptor: Positive (15%, Strong) Her2: Negative Ki-67: 15% Best tumor block for sendout testing: 1B Treatment Effect: No known presurgical therapy Pathologic Stage Classification (pTNM, AJCC 8th Edition): Primary Tumor: pT1b Regional Lymph Nodes: pN0 DAWN  Diagnosis 02/09/17 Breast, left, needle core biopsy - INVASIVE DUCTAL CARCINOMA. - DUCTAL CARCINOMA IN SITU. - SEE COMMENT. Microscopic Comment The carcinoma appears grade I - II. A breast prognostic profile will be performed and the results reported separately. The results were called to [facility] on 02/10/2017. Results: IMMUNOHISTOCHEMICAL AND MORPHOMETRIC ANALYSIS PERFORMED MANUALLY Estrogen Receptor: 100%, POSITIVE, STRONG STAINING INTENSITY Progesterone Receptor: 15%, POSITIVE, STRONG STAINING INTENSITY Proliferation Marker Ki67: 15% REFERENCE RANGE ESTROGEN RECEPTOR NEGATIVE 0% POSITIVE =>1% REFERENCE RANGE PROGESTERONE RECEPTOR NEGATIVE 0% POSITIVE =>1% All controls stained appropriately Results: HER2 - NEGATIVE RATIO OF HER2/CEP17 SIGNALS  1.24 AVERAGE HER2 COPY NUMBER PER CELL 1.80 Reference Range: NEGATIVE HER2/CEP17 Ratio <2.0 and average HER2 copy number <4.0 EQUIVOCAL HER2/CEP17 Ratio <2.0 and average HER2 copy number >=4.0 and <6.0   ONCOTYPE 03/16/17    RADIOGRAPHIC STUDIES: I have personally reviewed the radiological images as listed and agreed with the findings in the report.  02/22/2018 CT Chest IMPRESSION: 1. Lung-RADS 3, probably benign findings. Short-term follow-up in 6 months is recommended with repeat low-dose chest CT without contrast (please use the following order, "CT CHEST LCS NODULE FOLLOW-UP W/O CM"). 2. Aortic Atherosclerosis (ICD10-I70.0) and Emphysema (ICD10-J43.9). 3. Calcifications noted within the RCA, LAD and left circumflex coronary arteries. 4. Mild aneurysmal dilatation of the ascending thoracic aorta.  02/03/2018 Mammogram Benign  ASSESSMENT & PLAN:  Monica [name] is 72 y.o. caucasian female who has a history of an MI, CHF and DM. She is here for her newly diagnosed Breast Cancer  1. 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 -the Oncotype Dx result was reviewed with her in details. She had intermediate risk based on the recurrence score 27, which predicts 10 year distant recurrence after 5 years of tamoxifen 18%. The benefit of chemotherapy in the intermediate risk group is small and controversial. Given her advanced age, comorbidities, strong ER and PR expression in her tumor, we decided not to pursue adjuvant chemotherapy. -She completed radiation on 06/17/17 and tolerated very well with only mild reaction.  -She went to survivorship clinic in Dec 2018 -She started adjuvant anastrozole in late 06/2017 and has tolerated well with no issues so far. -She is clinically doing very well,  asymptomatic.  Lab reviewed, mild thrombocytopenia is stable, no other concerns.  No clinical concern for recurrence. -Last mammogram on 02/03/2018 was benign  -continue adjuvant anastrozole, and breast cancer surveillance. -repeat CT Chest scan in December. And f/u few days after.   2. DM -Much better controlled lately.  She is on insulin and multiple oral diabetic medications -Follows up with Dr. Maudie Mercury her PCP   3. 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  -No history of liver disease or alcohol abuse. 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 -We'll continue monitoring, Stable   4. Osteopenia  -Continue calcium and vitamin D -Based on her DEXA from 06/30/17 she is osteopenic with a left femur neck T-Score of -2.4  -We discussed the potential negative impact on her bone density from anastrozole.  We will follow-up her bone density scan every 2 years -she has started risedronate by her PCP, tolerating well, will continue.  5.  Depression and mood swing -She has become slightly more irritable since she started anastrozole - she is on Zoloft 100 mg daily, I recommend her to increase to 150 mg daily, I previously called in additional 50 mg daily dose for her. -She is doing well  6. Smoking and lung nodule  -She has heavy smoking history, has cut cigarettes to half pack a day -She underwent low-dose CT scan in 02/2018 for lung cancer screening, which showed several small pulmonary nodules, largest in the right upper lobe 7 mm, likely benign, will follow-up. -CT chest without contrast in 4 months. -I strongly encourage patient to quit smoking completely.  She is willing to cut it back   PLAN: -Continue anastrozole  -CT chest scan in December. And f/u few days after.  -labs and f/u in 4 months  All questions were answered. The patient knows to call the clinic with any problems, questions or concerns. I  spent 20 minutes counseling the patient face to face. The total time spent in the appointment was 25 minutes and more than 50% was on counseling.  Dierdre Searles Dweik am acting as scribe for Dr. Truitt Merle.  I have reviewed the above documentation for accuracy and completeness, and I agree with the above.     Truitt Merle, MD 05/25/2018

## 2018-05-24 ENCOUNTER — Other Ambulatory Visit: Payer: Self-pay | Admitting: Gastroenterology

## 2018-05-25 ENCOUNTER — Inpatient Hospital Stay: Payer: Medicare Other | Attending: Hematology

## 2018-05-25 ENCOUNTER — Inpatient Hospital Stay: Payer: Medicare Other | Admitting: Hematology

## 2018-05-25 ENCOUNTER — Telehealth: Payer: Self-pay | Admitting: Hematology

## 2018-05-25 ENCOUNTER — Encounter: Payer: Self-pay | Admitting: Hematology

## 2018-05-25 VITALS — HR 67 | Temp 98.0°F | Resp 18 | Ht 65.0 in | Wt 116.3 lb

## 2018-05-25 DIAGNOSIS — M8580 Other specified disorders of bone density and structure, unspecified site: Secondary | ICD-10-CM

## 2018-05-25 DIAGNOSIS — F329 Major depressive disorder, single episode, unspecified: Secondary | ICD-10-CM | POA: Insufficient documentation

## 2018-05-25 DIAGNOSIS — Z79811 Long term (current) use of aromatase inhibitors: Secondary | ICD-10-CM | POA: Diagnosis not present

## 2018-05-25 DIAGNOSIS — E119 Type 2 diabetes mellitus without complications: Secondary | ICD-10-CM | POA: Diagnosis not present

## 2018-05-25 DIAGNOSIS — Z170 Estrogen receptor positive status [ER+]: Secondary | ICD-10-CM

## 2018-05-25 DIAGNOSIS — C50212 Malignant neoplasm of upper-inner quadrant of left female breast: Secondary | ICD-10-CM | POA: Diagnosis not present

## 2018-05-25 DIAGNOSIS — D696 Thrombocytopenia, unspecified: Secondary | ICD-10-CM | POA: Diagnosis not present

## 2018-05-25 LAB — COMPREHENSIVE METABOLIC PANEL
ALBUMIN: 4.1 g/dL (ref 3.5–5.0)
ALT: 22 U/L (ref 0–44)
AST: 23 U/L (ref 15–41)
Alkaline Phosphatase: 69 U/L (ref 38–126)
Anion gap: 9 (ref 5–15)
BILIRUBIN TOTAL: 0.6 mg/dL (ref 0.3–1.2)
BUN: 10 mg/dL (ref 8–23)
CHLORIDE: 102 mmol/L (ref 98–111)
CO2: 29 mmol/L (ref 22–32)
Calcium: 10.5 mg/dL — ABNORMAL HIGH (ref 8.9–10.3)
Creatinine, Ser: 0.7 mg/dL (ref 0.44–1.00)
GFR calc Af Amer: >60 mL/min (ref >60)
GFR calc non Af Amer: >60 mL/min (ref >60)
GLUCOSE: 160 mg/dL — AB (ref 70–99)
POTASSIUM: 4.2 mmol/L (ref 3.5–5.1)
Sodium: 140 mmol/L (ref 135–145)
TOTAL PROTEIN: 7.3 g/dL (ref 6.5–8.1)

## 2018-05-25 LAB — RETICULOCYTES
RBC.: 6.3 MIL/uL — ABNORMAL HIGH (ref 3.70–5.45)
Retic Count, Absolute: 69.3 10*3/uL (ref 33.7–90.7)
Retic Ct Pct: 1.1 % (ref 0.7–2.1)

## 2018-05-25 LAB — CBC WITH DIFFERENTIAL (CANCER CENTER ONLY)
Basophils Absolute: 0 10*3/uL (ref 0.0–0.1)
Basophils Relative: 0 %
EOS PCT: 2 %
Eosinophils Absolute: 0.1 10*3/uL (ref 0.0–0.5)
HCT: 47.4 % — ABNORMAL HIGH (ref 34.8–46.6)
Hemoglobin: 15.6 g/dL (ref 11.6–15.9)
LYMPHS ABS: 1.5 10*3/uL (ref 0.9–3.3)
Lymphocytes Relative: 28 %
MCH: 24.8 pg — AB (ref 25.1–34.0)
MCHC: 32.9 g/dL (ref 31.5–36.0)
MCV: 75.2 fL — AB (ref 79.5–101.0)
MONO ABS: 0.3 10*3/uL (ref 0.1–0.9)
MONOS PCT: 6 %
Neutro Abs: 3.5 10*3/uL (ref 1.5–6.5)
Neutrophils Relative %: 64 %
PLATELETS: 97 10*3/uL — AB (ref 145–400)
RBC: 6.3 MIL/uL — ABNORMAL HIGH (ref 3.70–5.45)
RDW: 16.7 % — AB (ref 11.2–14.5)
WBC Count: 5.4 10*3/uL (ref 3.9–10.3)

## 2018-05-25 NOTE — Telephone Encounter (Signed)
Scheduled appt per 8/15 los - gave patient AVS and calender per los.

## 2018-06-09 ENCOUNTER — Ambulatory Visit (HOSPITAL_COMMUNITY)
Admission: RE | Admit: 2018-06-09 | Discharge: 2018-06-09 | Disposition: A | Discharge status: Home / Self Care | Payer: Medicare Other | Source: Ambulatory Visit | Attending: Gastroenterology | Admitting: Gastroenterology

## 2018-06-09 ENCOUNTER — Other Ambulatory Visit: Payer: Self-pay

## 2018-06-09 ENCOUNTER — Ambulatory Visit (HOSPITAL_COMMUNITY): Payer: Medicare Other | Admitting: Anesthesiology

## 2018-06-09 ENCOUNTER — Encounter (HOSPITAL_COMMUNITY): Payer: Self-pay | Admitting: Emergency Medicine

## 2018-06-09 ENCOUNTER — Encounter (HOSPITAL_COMMUNITY)
Admission: RE | Disposition: A | Discharge status: Home / Self Care | Payer: Self-pay | Source: Ambulatory Visit | Attending: Gastroenterology

## 2018-06-09 DIAGNOSIS — F17210 Nicotine dependence, cigarettes, uncomplicated: Secondary | ICD-10-CM | POA: Diagnosis not present

## 2018-06-09 DIAGNOSIS — Z86010 Personal history of colonic polyps: Secondary | ICD-10-CM | POA: Diagnosis not present

## 2018-06-09 DIAGNOSIS — D123 Benign neoplasm of transverse colon: Secondary | ICD-10-CM | POA: Insufficient documentation

## 2018-06-09 DIAGNOSIS — Z794 Long term (current) use of insulin: Secondary | ICD-10-CM | POA: Insufficient documentation

## 2018-06-09 DIAGNOSIS — D122 Benign neoplasm of ascending colon: Secondary | ICD-10-CM | POA: Insufficient documentation

## 2018-06-09 DIAGNOSIS — I110 Hypertensive heart disease with heart failure: Secondary | ICD-10-CM | POA: Diagnosis not present

## 2018-06-09 DIAGNOSIS — F329 Major depressive disorder, single episode, unspecified: Secondary | ICD-10-CM | POA: Insufficient documentation

## 2018-06-09 DIAGNOSIS — K5730 Diverticulosis of large intestine without perforation or abscess without bleeding: Secondary | ICD-10-CM | POA: Insufficient documentation

## 2018-06-09 DIAGNOSIS — Z1211 Encounter for screening for malignant neoplasm of colon: Secondary | ICD-10-CM | POA: Diagnosis not present

## 2018-06-09 DIAGNOSIS — Z79899 Other long term (current) drug therapy: Secondary | ICD-10-CM | POA: Insufficient documentation

## 2018-06-09 DIAGNOSIS — K635 Polyp of colon: Secondary | ICD-10-CM | POA: Diagnosis not present

## 2018-06-09 DIAGNOSIS — I252 Old myocardial infarction: Secondary | ICD-10-CM | POA: Insufficient documentation

## 2018-06-09 DIAGNOSIS — I2510 Atherosclerotic heart disease of native coronary artery without angina pectoris: Secondary | ICD-10-CM | POA: Diagnosis not present

## 2018-06-09 DIAGNOSIS — E785 Hyperlipidemia, unspecified: Secondary | ICD-10-CM | POA: Insufficient documentation

## 2018-06-09 DIAGNOSIS — I509 Heart failure, unspecified: Secondary | ICD-10-CM | POA: Insufficient documentation

## 2018-06-09 HISTORY — PX: COLONOSCOPY WITH PROPOFOL: SHX5780

## 2018-06-09 HISTORY — PX: POLYPECTOMY: SHX5525

## 2018-06-09 LAB — GLUCOSE, CAPILLARY: Glucose-Capillary: 131 mg/dL — ABNORMAL HIGH (ref 70–99)

## 2018-06-09 SURGERY — COLONOSCOPY WITH PROPOFOL
Anesthesia: Monitor Anesthesia Care

## 2018-06-09 MED ORDER — PROPOFOL 10 MG/ML IV BOLUS
INTRAVENOUS | Status: DC | PRN
  Administered 2018-06-09 (×3): 10 mg via INTRAVENOUS
  Administered 2018-06-09 (×2): 20 mg via INTRAVENOUS
  Administered 2018-06-09 (×4): 10 mg via INTRAVENOUS
  Administered 2018-06-09: 20 mg via INTRAVENOUS
  Administered 2018-06-09: 10 mg via INTRAVENOUS
  Administered 2018-06-09 (×2): 20 mg via INTRAVENOUS
  Administered 2018-06-09 (×3): 10 mg via INTRAVENOUS

## 2018-06-09 MED ORDER — MIDAZOLAM HCL 2 MG/2ML IJ SOLN
INTRAMUSCULAR | Status: AC
  Filled 2018-06-09: qty 2

## 2018-06-09 MED ORDER — LIDOCAINE HCL (CARDIAC) PF 100 MG/5ML IV SOSY
PREFILLED_SYRINGE | INTRAVENOUS | Status: DC | PRN
  Administered 2018-06-09: 30 mg via INTRAVENOUS

## 2018-06-09 MED ORDER — PROPOFOL 10 MG/ML IV BOLUS
INTRAVENOUS | Status: AC
  Filled 2018-06-09: qty 40

## 2018-06-09 MED ORDER — SODIUM CHLORIDE 0.9 % IV SOLN: INTRAVENOUS | Status: DC

## 2018-06-09 MED ORDER — LACTATED RINGERS IV SOLN
INTRAVENOUS | Status: DC | PRN
  Administered 2018-06-09: 09:00:00 via INTRAVENOUS

## 2018-06-09 SURGICAL SUPPLY — 21 items

## 2018-06-09 NOTE — H&P (Signed)
  Monica [name] HPI: At this time the patient denies any problems with nausea, vomiting, fevers, chills, abdominal pain, constipation, hematochezia, melena, or dysphagia. The patient denies any known family history of colon cancers. No complaints of chest pain, SOB, MI, or sleep apnea. Her colonoscopy on 05/30/2015 was significant for 5 adenomas throughout the colon. Intermittently she will have diarrhea, but there is no consistency. Omeprazole helps to control her GERD. Her latest A1C was at 8%.  Past Medical History:  Diagnosis Date  . CAD (coronary artery disease)    Cypher stent 09/2002  . Cancer [facility]) 2018   left breast, radiation therapy  . CHF (congestive heart failure) ([place])   . Depression   . Diabetes mellitus ([place])   . Hyperlipidemia   . Hypertension   . Myocardial infarction ([place])    2003,went into cardiac shock  . Osteoporosis   . Seizures ([place])    1st seizure age late 59's; most recent 07/28/17  . Sinusitis     Past Surgical History:  Procedure Laterality Date  . ABDOMINAL HYSTERECTOMY    . APPENDECTOMY    . BREAST LUMPECTOMY WITH RADIOACTIVE SEED AND SENTINEL LYMPH NODE BIOPSY Left 03/16/2017   Procedure: INJECT BLUE DYE LEFT BREAST, LEFT BREAST LUMPECTOMY WITH RADIOACTIVE SEED AND LEFT AXILLARY DEEP SENTINEL LYMPH NODE  BIOPSY ADJACENT TISSUE TRANSFER;  Surgeon: [name], MD;  Location: [place];  Service: General;  Laterality: Left;  . CARPAL TUNNEL RELEASE Bilateral   . COLONOSCOPY WITH PROPOFOL N/A 05/30/2015   Procedure: COLONOSCOPY WITH PROPOFOL;  Surgeon: Carol Ada, MD;  [facility];  Service: Endoscopy;  Laterality: N/A;  . CORONARY ANGIOPLASTY WITH STENT PLACEMENT  10-02-2002  . Exploratory abdominal     Bleeding in abdomen after tubal  . TUBAL LIGATION    . UMBILICAL HERNIA REPAIR      Family History  Problem Relation Age of Onset  . Dementia Mother   . Kidney disease Mother   . Heart attack Father 9       Died age 14  . Heart  attack Brother 39  . Heart attack Brother 70    Social History:  reports that she has been smoking cigarettes. She has a 60.00 pack-year smoking history. She has never used smokeless tobacco. She reports that she does not drink alcohol or use drugs.  Allergies:  Allergies  Allergen Reactions  . Ace Inhibitors Dermatitis and Cough  . Keflex [Cephalexin] Itching  . Phenobarbital Other (See Comments)    Black spots on hands and feet.  . Sulfate Nausea And Vomiting and Other (See Comments)    Dizziness   . Altace [Ramipril] Other (See Comments)    UNSPECIFIED REACTION      Medications: Scheduled: Continuous:  No results found for this or any previous visit (from the past 24 hour(s)).   No results found.  ROS:  As stated above in the HPI otherwise negative.  There were no vitals taken for this visit.    PE: Gen: NAD, Alert and Oriented HEENT:  [place]/AT, EOMI Neck: Supple, no LAD Lungs: CTA Bilaterally CV: RRR without M/G/R ABM: Soft, NTND, +BS Ext: No C/C/E  Assessment/Plan: 1) Personal history of polyps - colonoscopy.  Monica [name] D 06/09/2018, 7:28 AM

## 2018-06-09 NOTE — Op Note (Signed)
[facility] Patient Name: Monica [name] Procedure Date: 06/09/2018 MRN: [id number] Attending MD: Carol Ada , MD Date of Birth: [birth year] CSN: [id number] Age: 72 Admit Type: Inpatient Procedure:                Colonoscopy Indications:              High risk colon cancer surveillance: Personal                            history of colonic polyps Providers:                Carol Ada, MD, Burtis Junes, RN, Alan Mulder,                            Technician, Karis Juba, CRNA Referring MD:              Medicines:                Propofol per Anesthesia Complications:            No immediate complications. Estimated Blood Loss:     Estimated blood loss: none. Procedure:                Pre-Anesthesia Assessment:                           - Prior to the procedure, a History and Physical                            was performed, and patient medications and                            allergies were reviewed. The patient's tolerance of                            previous anesthesia was also reviewed. The risks                            and benefits of the procedure and the sedation                            options and risks were discussed with the patient.                            All questions were answered, and informed consent                            was obtained. Prior Anticoagulants: The patient has                            taken no previous anticoagulant or antiplatelet                            agents. ASA Grade Assessment: III - A patient with  severe systemic disease. After reviewing the risks                            and benefits, the patient was deemed in                            satisfactory condition to undergo the procedure.                           - Sedation was administered by an anesthesia                            professional. Deep sedation was attained.                           After obtaining informed  consent, the colonoscope                            was passed under direct vision. Throughout the                            procedure, the patient's blood pressure, pulse, and                            oxygen saturations were monitored continuously. The                            PCF-H190DL ([id number] Olympus peds colonoscope was                            introduced through the anus and advanced to the the                            cecum, identified by appendiceal orifice and                            ileocecal valve. The colonoscopy was performed                            without difficulty. The patient tolerated the                            procedure well. The quality of the bowel                            preparation was good. The ileocecal valve,                            appendiceal orifice, and rectum were photographed. Scope In: 9:04:47 AM Scope Out: 9:24:26 AM Scope Withdrawal Time: 0 hours 11 minutes 37 seconds  Total Procedure Duration: 0 hours 19 minutes 39 seconds  Findings:      Four sessile polyps were found in the sigmoid colon, transverse colon       and ascending colon. The polyps were 2 to  3 mm in size. These polyps       were removed with a cold snare. Resection and retrieval were complete.      Scattered small and large-mouthed diverticula were found in the sigmoid       colon. Impression:               - Four 2 to 3 mm polyps in the sigmoid colon, in                            the transverse colon and in the ascending colon,                            removed with a cold snare. Resected and retrieved.                           - Diverticulosis in the sigmoid colon. Moderate Sedation:      N/A- Per Anesthesia Care Recommendation:           - Patient has a contact number available for                            emergencies. The signs and symptoms of potential                            delayed complications were discussed with the                             patient. Return to normal activities tomorrow.                            Written discharge instructions were provided to the                            patient.                           - Resume previous diet.                           - Continue present medications.                           - Await pathology results.                           - Repeat colonoscopy in 3 - 5 years for                            surveillance. Procedure Code(s):        --- Professional ---                           [phone], Colonoscopy, flexible; with removal of                            tumor(s), polyp(s), or other lesion(s) by snare  technique Diagnosis Code(s):        --- Professional ---                           D12.5, Benign neoplasm of sigmoid colon                           Z86.010, Personal history of colonic polyps                           D12.3, Benign neoplasm of transverse colon (hepatic                            flexure or splenic flexure)                           D12.2, Benign neoplasm of ascending colon                           K57.30, Diverticulosis of large intestine without                            perforation or abscess without bleeding CPT copyright 2017 [employer]. All rights reserved. The codes documented in this report are preliminary and upon coder review may  be revised to meet current compliance requirements. Carol Ada, MD Carol Ada, MD 06/09/2018 9:28:34 AM This report has been signed electronically. Number of Addenda: 0

## 2018-06-09 NOTE — Anesthesia Preprocedure Evaluation (Signed)
Anesthesia Evaluation  Patient identified by MRN, date of birth, ID band Patient awake    Reviewed: Allergy & Precautions, NPO status , Patient's Chart, lab work & pertinent test results  Airway Mallampati: I       Dental no notable dental hx. (+) Teeth Intact   Pulmonary Current Smoker,    Pulmonary exam normal breath sounds clear to auscultation       Cardiovascular hypertension, Pt. on medications and Pt. on home beta blockers Normal cardiovascular exam Rhythm:Regular Rate:Normal     Neuro/Psych    GI/Hepatic negative GI ROS, Neg liver ROS,   Endo/Other  diabetes, Insulin Dependent, Oral Hypoglycemic Agents  Renal/GU negative Renal ROS  negative genitourinary   Musculoskeletal   Abdominal Normal abdominal exam  (+)   Peds  Hematology   Anesthesia Other Findings Monica [name]  ECHO COMPLETE WO IMAGING ENHANCING AGENT  Order# [id number]  Reading physician: Pixie Casino, MD Ordering physician: Minus Breeding, MD Study date: 04/11/15 Result Notes for ECHOCARDIOGRAM COMPLETE   Notes Recorded by Vennie Homans on 04/15/2015 at 9:56 AM Spoke with pt about her Echo, pt verbally understand  Result of Echo was also send to Dr Maudie Mercury. ------  Notes Recorded by Minus Breeding, MD on 04/11/2015 at 10:17 PM EF is good. No significant abnormalities on this study.  Call Ms. Hodapp with the results and send results to Jani Gravel, MD   Study Result   Result status: Final result                 *[facility], [place], [place] [address]                            [phone]  ------------------------------------------------------------------- Transthoracic Echocardiography  Patient:    Monica, [name] MR #:       [id number] Study Date: 04/11/2015 Gender:     F Age:        72 Height:     165.1 cm Weight:     56.7  kg BSA:        1.61 m^2 Pt. Status: Room:   ATTENDING    Kirk Ruths  Frederik Schmidt Hochrein  REFERRING    Minus Breeding  PERFORMING   [facility], Outpatient  SONOGRAPHER  [facility], RDCS  cc:  ------------------------------------------------------------------- LV EF: 60% -   65%  ------------------------------------------------------------------- Indications:      Cardiomyopathy (I25.5).  ------------------------------------------------------------------- History:   PMH:   Coronary artery disease.  Risk factors:  Coronary Stent  Family history of coronary artery disease. Former tobacco use. Hypertension. Diabetes mellitus. Dyslipidemia.  ------------------------------------------------------------------- Study Conclusions  - Left ventricle: The cavity size was normal. Wall thickness was   increased in a pattern of moderate LVH. Systolic function was   normal. The estimated ejection fraction was in the range of 60%   to 65%. Wall motion was normal; there were no regional wall   motion abnormalities. Doppler parameters are consistent with   abnormal left ventricular relaxation (grade 1 diastolic   dysfunction). The E/e&' ratio is between 8-15, suggesting   indeterminate LV filling pressure. - Aortic valve: Trileaflet. Sclerosis without stenosis. There was  trivial regurgitation. - Mitral valve: Mildly thickened leaflets . There was trivial   regurgitation. - Left atrium: The atrium was normal in size. - Atrial septum: There was increased thickness of the septum,   consistent with lipomatous hypertrophy. - Inferior vena cava: The vessel was normal in size. The   respirophasic diameter changes were in the normal range (= 50%),   consistent with normal central venous pressure.  Impressions:  - LVEF 60-65%, moderate LVH, normal wall motion, diastolic   dysfunction, indeterminate LV filling pressure, trivial AI/MR,   normal LA size, lipomatous  hypertrophy of the IAS. Compared to a   prior echo in 2003, the EF has normalized.  Transthoracic echocardiography.  M-mode, complete 2D, spectral Doppler, and color Doppler.  Birthdate:  Patient birthdate: 1946-05-12.  Age:  Patient is 72 yr old.  Sex:  Gender: female. BMI: 20.8 kg/m^2.  Blood pressure:     132/78  Patient status: Outpatient.  Study date:  Study date: 04/11/2015. Study time: 11:05 AM.  Location:  Echo laboratory.  -------------------------------------------------------------------  ------------------------------------------------------------------- Left ventricle:  The cavity size was normal. Wall thickness was increased in a pattern of moderate LVH. Systolic function was normal. The estimated ejection fraction was in the range of 60% to 65%. Wall motion was normal; there were no regional wall motion abnormalities. Doppler parameters are consistent with abnormal left ventricular relaxation (grade 1 diastolic dysfunction). The E/e&' ratio is between 8-15, suggesting indeterminate LV filling pressure.  ------------------------------------------------------------------- Aortic valve:   Trileaflet. Sclerosis without stenosis.  Doppler: There was trivial regurgitation.  ------------------------------------------------------------------- Aorta:  Aortic root: The aortic root was normal in size. Ascending aorta: The ascending aorta was normal in size.  ------------------------------------------------------------------- Mitral valve:   Mildly thickened leaflets .  Doppler:  There was trivial regurgitation.  ------------------------------------------------------------------- Left atrium:  The atrium was normal in size.  ------------------------------------------------------------------- Atrial septum:  There was increased thickness of the septum, consistent with lipomatous  hypertrophy.  ------------------------------------------------------------------- Right ventricle:  The cavity size was normal. Wall thickness was normal. Systolic function was normal.  ------------------------------------------------------------------- Pulmonic valve:    The valve appears to be grossly normal. Doppler:  There was no significant regurgitation.  ------------------------------------------------------------------- Tricuspid valve:   Doppler:  There was no significant regurgitation.  ------------------------------------------------------------------- Pulmonary artery:   The main pulmonary artery was normal-sized.  ------------------------------------------------------------------- Right atrium:  The atrium was normal in size.  ------------------------------------------------------------------- Pericardium:  There was no pericardial effusion.  ------------------------------------------------------------------- Systemic veins: Inferior vena cava: The vessel was normal in size. The respirophasic diameter changes were in the normal range (= 50%), consistent with normal central venous pressure. Diameter: 14.9 mm.  ------------------------------------------------------------------- Measurements   IVC                                    Value        Reference  ID                                     14.9  mm     ---------    Left ventricle                         Value        Reference  LV ID, ED, PLAX chordal        (L)  35.7  mm     43 - 52  LV ID, ES, PLAX chordal                27.4  mm     23 - 38  LV fx shortening, PLAX chordal (L)     23    %      >=29  LV PW thickness, ED                    14.8  mm     ---------  IVS/LV PW ratio, ED                    0.69         <=1.3  LV e&', lateral                         7.46  cm/s   ---------  LV E/e&', lateral                       8.4          ---------  LV e&', medial                          4.83  cm/s    ---------  LV E/e&', medial                        12.98        ---------  LV e&', average                         6.15  cm/s   ---------  LV E/e&', average                       10.2         ---------    Ventricular septum                     Value        Reference  IVS thickness, ED                      10.2  mm     ---------    LVOT                                   Value        Reference  LVOT peak velocity, S                  111   cm/s   ---------  LVOT mean velocity, S                  66.4  cm/s   ---------  LVOT VTI, S                            19.8  cm     ---------  LVOT peak gradient, S                  5     mm Hg  ---------    Aorta  Value        Reference  Aortic root ID, ED                     32    mm     ---------    Left atrium                            Value        Reference  LA ID, A-P, ES                         29    mm     ---------  LA ID/bsa, A-P                         1.8   cm/m^2 <=2.2  LA volume, S                           35    ml     ---------  LA volume/bsa, S                       21.7  ml/m^2 ---------  LA volume, ES, 1-p A4C                 32    ml     ---------  LA volume/bsa, ES, 1-p A4C             19.9  ml/m^2 ---------  LA volume, ES, 1-p A2C                 38    ml     ---------  LA volume/bsa, ES, 1-p A2C             23.6  ml/m^2 ---------    Mitral valve                           Value        Reference  Mitral E-wave peak velocity            62.7  cm/s   ---------  Mitral A-wave peak velocity            89.3  cm/s   ---------  Mitral deceleration time       (H)     306   ms     150 - 230  Mitral E/A ratio, peak                 0.7          ---------    Tricuspid valve                        Value        Reference  Tricuspid regurg peak velocity         208   cm/s   ---------  Tricuspid peak RV-RA gradient          17    mm Hg  ---------    Right ventricle                        Value         Reference  RV s&', lateral, S  12.7  cm/s   ---------  Legend: (L)  and  (H)  mark values outside specified reference range.  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 Dx: Coronary artery disease involving native coronary artery of native heart without angina pectoris (I25.10 (ICD-10-CM)) Comments:  Surgical History   Surgical History    Procedure Laterality Date Comment Source CORONARY ANGIOPLASTY WITH STENT PLACEMENT  10-02-2002  Provider  Other Surgical History    Procedure Laterality Date Comment Source ABDOMINAL HYSTERECTOMY    Provider APPENDECTOMY    Provider BREAST LUMPECTOMY WITH RADIOACTIVE SEED AND SENTINEL LYMPH NODE BIOPSY Left 03/16/2017 Procedure: INJECT BLUE DYE LEFT BREAST, LEFT BREAST LUMPECTOMY WITH RADIOACTIVE SEED AND LEFT AXILLARY DEEP SENTINEL LYMPH NODE BIOPSY ADJACENT TISSUE TRANSFER; Surgeon: [name], MD; Location: [facility]; Service: General; Laterality: Left; Provider CARPAL TUNNEL RELEASE Bilateral   Provider COLONOSCOPY WITH PROPOFOL N/A 05/30/2015 Procedure: COLONOSCOPY WITH PROPOFOL; Surgeon: Carol Ada, MD; Location: [facility]; Service: Endoscopy; Laterality: N/A; Provider Exploratory abdominal   Bleeding in abdomen after tubal Provider TUBAL LIGATION    Provider UMBILICAL HERNIA REPAIR    Provider  Performing Technologist/Nurse   Performing Technologist/Nurse: Deliah Boston D          Implants    No active implants to display in this view. Order-Level Documents:   There are no order-level documents.  Encounter-Level Documents - 04/11/2015:   Scan  on 05/05/2015 12:37 PM by Default, Provider, MDScan on 05/05/2015 12:37 PM by Default, Provider, MD  Electronic signature on 04/11/2015 9:24 AM: [facility]mg[facility]/nl/tst - Signed  Electronic signature on 04/11/2015 9:24 AM: [facility]mg[facility]/nl/tst - Signed    Signed   Electronically signed by Pixie Casino, MD on 04/11/15 at 1644 EDT Printable Result Report    Result Report  External Result Report    External Result Report     Reproductive/Obstetrics                             Anesthesia Physical Anesthesia Plan  ASA: II  Anesthesia Plan: MAC   Post-op Pain Management:    Induction:   PONV Risk Score and Plan:   Airway Management Planned: Natural Airway, Nasal Cannula and Simple Face Mask  Additional Equipment:   Intra-op Plan:   Post-operative Plan:   Informed Consent: I have reviewed the patients History and Physical, chart, labs and discussed the procedure including the risks, benefits and alternatives for the proposed anesthesia with the patient or authorized representative who has indicated his/her understanding and acceptance.     Plan Discussed with: CRNA  Anesthesia Plan Comments:         Anesthesia Quick Evaluation

## 2018-06-09 NOTE — Transfer of Care (Signed)
Immediate Anesthesia Transfer of Care Note  Patient: Monica [name]  Procedure(s) Performed: COLONOSCOPY WITH PROPOFOL (N/A ) POLYPECTOMY  Patient Location: PACU and Endoscopy Unit  Anesthesia Type:MAC  Level of Consciousness: oriented, drowsy and patient cooperative  Airway & Oxygen Therapy: Patient Spontanous Breathing and Patient connected to nasal cannula oxygen  Post-op Assessment: Report given to RN and Post -op Vital signs reviewed and stable  Post vital signs: Reviewed and stable  Last Vitals:  Vitals Value Taken Time  BP 81/40 06/09/2018  9:30 AM  Temp    Pulse 63 06/09/2018  9:30 AM  Resp 20 06/09/2018  9:30 AM  SpO2 98 % 06/09/2018  9:30 AM  Vitals shown include unvalidated device data.  Last Pain:  Vitals:   06/09/18 0928  TempSrc: Oral  PainSc:          Complications: No apparent anesthesia complications

## 2018-06-09 NOTE — Anesthesia Postprocedure Evaluation (Signed)
Anesthesia Post Note  Patient: Monica [name]  Procedure(s) Performed: COLONOSCOPY WITH PROPOFOL (N/A ) POLYPECTOMY     Patient location during evaluation: Endoscopy Anesthesia Type: MAC Level of consciousness: awake and sedated Pain management: pain level controlled Vital Signs Assessment: post-procedure vital signs reviewed and stable Respiratory status: spontaneous breathing Cardiovascular status: stable Postop Assessment: no apparent nausea or vomiting Anesthetic complications: no    Last Vitals:  Vitals:   06/09/18 0928 06/09/18 0940  BP: (!) 81/40 (!) 92/53  Pulse: 63 70  Resp: 19 18  Temp: 36.5 C   SpO2: 99% (!) 79%    Last Pain:  Vitals:   06/09/18 0928  TempSrc: Axillary  PainSc:    Pain Goal:                 [name],[name]

## 2018-06-09 NOTE — Discharge Instructions (Signed)

## 2018-06-13 ENCOUNTER — Encounter (HOSPITAL_COMMUNITY): Payer: Self-pay | Admitting: Gastroenterology

## 2018-08-23 ENCOUNTER — Other Ambulatory Visit: Payer: Self-pay | Admitting: Hematology

## 2018-08-23 DIAGNOSIS — E2839 Other primary ovarian failure: Secondary | ICD-10-CM

## 2018-08-25 ENCOUNTER — Inpatient Hospital Stay: Payer: Medicare Other

## 2018-08-25 ENCOUNTER — Inpatient Hospital Stay: Payer: Medicare Other | Attending: Hematology | Admitting: Hematology

## 2018-08-25 ENCOUNTER — Telehealth: Payer: Self-pay

## 2018-08-25 ENCOUNTER — Ambulatory Visit (HOSPITAL_COMMUNITY)
Admission: RE | Admit: 2018-08-25 | Discharge: 2018-08-25 | Disposition: A | Discharge status: Home / Self Care | Payer: Medicare Other | Source: Ambulatory Visit | Attending: Adult Health | Admitting: Adult Health

## 2018-08-25 ENCOUNTER — Encounter: Payer: Self-pay | Admitting: Hematology

## 2018-08-25 VITALS — BP 114/66 | HR 57 | Temp 97.5°F | Resp 17 | Ht 65.0 in | Wt 118.3 lb

## 2018-08-25 DIAGNOSIS — Z170 Estrogen receptor positive status [ER+]: Secondary | ICD-10-CM | POA: Insufficient documentation

## 2018-08-25 DIAGNOSIS — C50212 Malignant neoplasm of upper-inner quadrant of left female breast: Secondary | ICD-10-CM

## 2018-08-25 DIAGNOSIS — M8580 Other specified disorders of bone density and structure, unspecified site: Secondary | ICD-10-CM | POA: Insufficient documentation

## 2018-08-25 DIAGNOSIS — Z79811 Long term (current) use of aromatase inhibitors: Secondary | ICD-10-CM | POA: Insufficient documentation

## 2018-08-25 DIAGNOSIS — F17210 Nicotine dependence, cigarettes, uncomplicated: Secondary | ICD-10-CM | POA: Insufficient documentation

## 2018-08-25 DIAGNOSIS — D696 Thrombocytopenia, unspecified: Secondary | ICD-10-CM | POA: Insufficient documentation

## 2018-08-25 DIAGNOSIS — J439 Emphysema, unspecified: Secondary | ICD-10-CM | POA: Diagnosis not present

## 2018-08-25 DIAGNOSIS — I2510 Atherosclerotic heart disease of native coronary artery without angina pectoris: Secondary | ICD-10-CM | POA: Diagnosis not present

## 2018-08-25 DIAGNOSIS — E119 Type 2 diabetes mellitus without complications: Secondary | ICD-10-CM | POA: Diagnosis not present

## 2018-08-25 DIAGNOSIS — I509 Heart failure, unspecified: Secondary | ICD-10-CM | POA: Diagnosis not present

## 2018-08-25 DIAGNOSIS — R918 Other nonspecific abnormal finding of lung field: Secondary | ICD-10-CM | POA: Diagnosis not present

## 2018-08-25 DIAGNOSIS — Z122 Encounter for screening for malignant neoplasm of respiratory organs: Secondary | ICD-10-CM | POA: Diagnosis not present

## 2018-08-25 LAB — COMPREHENSIVE METABOLIC PANEL
ALBUMIN: 4.2 g/dL (ref 3.5–5.0)
ALT: 24 U/L (ref 0–44)
AST: 25 U/L (ref 15–41)
Alkaline Phosphatase: 60 U/L (ref 38–126)
Anion gap: 9 (ref 5–15)
BUN: 10 mg/dL (ref 8–23)
CHLORIDE: 103 mmol/L (ref 98–111)
CO2: 28 mmol/L (ref 22–32)
Calcium: 10 mg/dL (ref 8.9–10.3)
Creatinine, Ser: 0.73 mg/dL (ref 0.44–1.00)
GFR calc Af Amer: >60 mL/min (ref >60)
GFR calc non Af Amer: >60 mL/min (ref >60)
GLUCOSE: 135 mg/dL — AB (ref 70–99)
POTASSIUM: 4.2 mmol/L (ref 3.5–5.1)
SODIUM: 140 mmol/L (ref 135–145)
Total Bilirubin: 0.5 mg/dL (ref 0.3–1.2)
Total Protein: 7.6 g/dL (ref 6.5–8.1)

## 2018-08-25 LAB — RETICULOCYTES
Immature Retic Fract: 18.9 % — ABNORMAL HIGH (ref 2.3–15.9)
RBC.: 6.7 MIL/uL — ABNORMAL HIGH (ref 3.87–5.11)
RETIC COUNT ABSOLUTE: 82.4 10*3/uL (ref 19.0–186.0)
RETIC CT PCT: 1.2 % (ref 0.4–3.1)

## 2018-08-25 LAB — CBC WITH DIFFERENTIAL/PLATELET
ABS IMMATURE GRANULOCYTES: 0.02 10*3/uL (ref 0.00–0.07)
Basophils Absolute: 0 10*3/uL (ref 0.0–0.1)
Basophils Relative: 0 %
Eosinophils Absolute: 0.1 10*3/uL (ref 0.0–0.5)
Eosinophils Relative: 2 %
HCT: 51.6 % — ABNORMAL HIGH (ref 36.0–46.0)
HEMOGLOBIN: 16.3 g/dL — AB (ref 12.0–15.0)
IMMATURE GRANULOCYTES: 0 %
LYMPHS PCT: 30 %
Lymphs Abs: 1.8 10*3/uL (ref 0.7–4.0)
MCH: 24.3 pg — AB (ref 26.0–34.0)
MCHC: 31.6 g/dL (ref 30.0–36.0)
MCV: 77 fL — AB (ref 80.0–100.0)
MONO ABS: 0.3 10*3/uL (ref 0.1–1.0)
MONOS PCT: 5 %
NEUTROS ABS: 3.7 10*3/uL (ref 1.7–7.7)
Neutrophils Relative %: 63 %
Platelets: 110 10*3/uL — ABNORMAL LOW (ref 150–400)
RBC: 6.7 MIL/uL — AB (ref 3.87–5.11)
RDW: 17.8 % — ABNORMAL HIGH (ref 11.5–15.5)
WBC: 5.9 10*3/uL (ref 4.0–10.5)
nRBC: 0 % (ref 0.0–0.2)

## 2018-08-25 NOTE — Progress Notes (Addendum)
[facility]  [phone] Fax:(336) [phone]  Clinic Follow-up Note   Patient Care Team: Jani Gravel, MD as PCP - General (Internal Medicine) [name], MD as Consulting Physician (General Surgery) Truitt Merle, MD as Consulting Physician (Hematology) Kyung Rudd, MD as Consulting Physician (Radiation Oncology) Gardenia Phlegm, NP as Nurse Practitioner (Hematology and Oncology)   Date of Service:  08/25/2018  CHIEF COMPLAINTS:  Follow up left Breast cancer   Oncology History   Cancer Staging Malignant neoplasm of upper-inner quadrant of left breast in female, estrogen receptor positive ([place]) Staging form: Breast, AJCC 8th Edition - Clinical stage from 02/16/2017: Stage IA (cT1b, cN0, cM0, G2, ER: Positive, PR: Positive, HER2: Negative) - Unsigned Staging comments: Staged at breast conference  - Pathologic stage from 03/16/2017: Stage IA (pT1b, pN0, cM0, G2, ER: Positive, PR: Positive, HER2: Negative, Oncotype DX score: 27) - Signed by Truitt Merle, MD on 04/24/2017       Malignant neoplasm of upper-inner quadrant of left breast in female, estrogen receptor positive ([place])   02/09/2017 Initial Diagnosis    Malignant neoplasm of upper-inner quadrant of left breast in female, estrogen receptor positive ([place])    02/09/2017 Initial Biopsy    Diagnosis Breast, left, needle core biopsy - INVASIVE DUCTAL CARCINOMA, 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 Dalbert Batman     03/16/2017 Pathology Results    PATHOLOGY REPORT Diagnosis 03/16/17 1. Breast, lumpectomy, Left - INVASIVE DUCTAL CARCINOMA, NOTTINGHAM GRADE 2 OF 3, 0.9  CM - DUCTAL CARCINOMA IN SITU - MARGINS UNINVOLVED BY CARCINOMA (0.3 CM POSTERIOR MARGIN) - PREVIOUS BIOPSY SITE CHANGES - SEE ONCOLOGY TABLE BELOW 2. Lymph node, sentinel, biopsy, Left axillary #1 - NO CARCINOMA IDENTIFIED IN ONE LYMPH NODE (0/1) 3. Lymph node, sentinel, biopsy, Left axillary #2 - NO CARCINOMA IDENTIFIED IN ONE LYMPH NODE (0/1) 4. Lymph node, sentinel, biopsy, Left axillary #3 - NO CARCINOMA IDENTIFIED IN ONE LYMPH NODE (0/1) 5. Lymph node, sentinel, biopsy, Left axillary #4 - NO CARCINOMA IDENTIFIED IN ONE LYMPH NODE (0/1)     03/16/2017 Oncotype testing    Recurrance score of 27 with a 18% distance recurrance in the net 10 years with Tamoxifen alone     05/19/2017 - 06/16/2017 Radiation Therapy    Radiation with Dr. Lisbeth Renshaw    06/2017 -  Anti-estrogen oral therapy    anastrozole 1 mg once a day starting late 06/2017        HISTORY OF PRESENTING ILLNESS: 02/16/17 Monica [name] 72 y.o. female is here because of newly diagnosed left breast cancer. She is accompanied by her multiple family members to our disciplinary breast clinic today.  This was found by a mammogram screening of the left breast. She had a abnormal benign mammogram 35 years ago and had surgery to remove the mass form the left breast. She denies feeling a lump herself and denies any change lately. She reports having a fair appetite and no weight loss. She ranges 124/127 pounds. She denies and pain and aches but has pain in her left knee joint but tolerable as well in her hip. She takes tylenol for pain. She has a history of DM and  follows up with Dr. Maudie Mercury. Her sugar has been running high lightly. Had a CHF and MI in 2003 and has a stent.She denies chest pain and her last echo was in 2016 and is normal. She has not had a stroke. No history of breast cancer in family. She still smokes.   Her most exercise is walking her dog and she remain moving around the house. She will see her PCP next week   GYN HISTORY   Menarchal: 11 LMP: many years ago, not sure  Contraceptive: none  HRT: none  G3P3: She was 25 at first live births.   CURRENT THERAPY: anastrozole 1 mg once a day starting 06/2017    INTERVAL HISTORY:  Monica [name] is here for a follow up of her left breast cancer. She presents to the clinic today. She notes having dry mouth recently. She attributes this to Anastrozole. She has not has any other side effects.  She notes she had a colonoscopy and notes she has diverticulosis and is at high risk for colon cancer.     MEDICAL HISTORY:  Past Medical History:  Diagnosis Date  . CAD (coronary artery disease)    Cypher stent 09/2002  . Cancer [facility]) 2018   left breast, radiation therapy  . CHF (congestive heart failure) ([place])   . Depression   . Diabetes mellitus ([place])   . Hyperlipidemia   . Hypertension   . Myocardial infarction ([place])    2003,went into cardiac shock  . Osteoporosis   . Seizures ([place])    1st seizure age late 26's; most recent 07/28/17  . Sinusitis     SURGICAL HISTORY: Past Surgical History:  Procedure Laterality Date  . ABDOMINAL HYSTERECTOMY    . APPENDECTOMY    . BREAST LUMPECTOMY WITH RADIOACTIVE SEED AND SENTINEL LYMPH NODE BIOPSY Left 03/16/2017   Procedure: INJECT BLUE DYE LEFT BREAST, LEFT BREAST LUMPECTOMY WITH RADIOACTIVE SEED AND LEFT AXILLARY DEEP SENTINEL LYMPH NODE  BIOPSY ADJACENT TISSUE TRANSFER;  Surgeon: [name], MD;  Location: [facility];  Service: General;  Laterality: Left;  . CARPAL TUNNEL RELEASE Bilateral   . COLONOSCOPY WITH PROPOFOL N/A 05/30/2015   Procedure: COLONOSCOPY WITH PROPOFOL;  Surgeon: Carol Ada, MD;  Location: [facility];  Service: Endoscopy;  Laterality: N/A;  . COLONOSCOPY WITH PROPOFOL N/A 06/09/2018   Procedure: COLONOSCOPY WITH PROPOFOL;  Surgeon: Carol Ada, MD;  Location: [facility];  Service: Endoscopy;  Laterality: N/A;  . CORONARY ANGIOPLASTY WITH STENT PLACEMENT  10-02-2002  . Exploratory abdominal      Bleeding in abdomen after tubal  . POLYPECTOMY  06/09/2018   Procedure: POLYPECTOMY;  Surgeon: Carol Ada, MD;  Location: [facility];  Service: Endoscopy;;  . TUBAL LIGATION    . UMBILICAL HERNIA REPAIR      SOCIAL HISTORY: Social History   Socioeconomic History  . Marital status: Married    Spouse name: Sonia Side  . Number of children: 3  . Years of education: 76  . Highest education level: Not on file  Occupational History  . Not on file  Social Needs  . Financial resource strain: Not on file  . Food insecurity:    Worry: Not on file    Inability: Not on file  . Transportation needs:    Medical: Not on file    Non-medical: Not on file  Tobacco Use  . Smoking status: Current Every Day Smoker    Packs/day: 2.00    Years: 30.00    Pack years: 60.00  Types: Cigarettes    Last attempt to quit: 03/09/2017    Years since quitting: 1.4  . Smokeless tobacco: Never Used  . Tobacco comment: e cig, 08/02/17 1 PPD  Substance and Sexual Activity  . Alcohol use: No    Alcohol/week: 0.0 standard drinks  . Drug use: No  . Sexual activity: Not on file  Lifestyle  . Physical activity:    Days per week: Not on file    Minutes per session: Not on file  . Stress: Not on file  Relationships  . Social connections:    Talks on phone: Not on file    Gets together: Not on file    Attends religious service: Not on file    Active member of club or organization: Not on file    Attends meetings of clubs or organizations: Not on file    Relationship status: Not on file  . Intimate partner violence:    Fear of current or ex partner: Not on file    Emotionally abused: Not on file    Physically abused: Not on file    Forced sexual activity: Not on file  Other Topics Concern  . Not on file  Social History Narrative   Lives with husband.    Caffeine-     FAMILY HISTORY: Family History  Problem Relation Age of Onset  . Dementia Mother   . Kidney disease Mother   . Heart attack  Father 49       Died age 13  . Heart attack Brother 51  . Heart attack Brother 23    ALLERGIES:  is allergic to ace inhibitors; keflex [cephalexin]; phenobarbital; sulfate; and altace [ramipril].  MEDICATIONS:  Current Outpatient Medications  Medication Sig Dispense Refill  . acetaminophen (TYLENOL) 500 MG tablet Take 1,000 mg by mouth every 6 (six) hours as needed for moderate pain or headache.    . anastrozole (ARIMIDEX) 1 MG tablet Take 1 tablet (1 mg total) daily by mouth. 90 tablet 1  . anastrozole (ARIMIDEX) 1 MG tablet TAKE 1 TABLET BY MOUTH  DAILY 30 tablet 3  . aspirin EC 81 MG tablet Take 81 mg by mouth at bedtime.    Marland Kitchen atorvastatin (LIPITOR) 40 MG tablet Take 40 mg by mouth at bedtime.    . Calcium Carbonate-Vitamin D 600-400 MG-UNIT per tablet Take 1 tablet by mouth daily.     . carvedilol (COREG) 3.125 MG tablet Take 3.125 mg by mouth 2 (two) times daily.     . clonazePAM (KLONOPIN) 1 MG tablet Take 1 mg by mouth at bedtime.     Marland Kitchen glimepiride (AMARYL) 4 MG tablet Take 4 mg by mouth 2 (two) times daily.    . halobetasol (ULTRAVATE) 0.05 % cream Apply 1 application topically 2 (two) times daily as needed (for rash).     . Insulin Detemir (LEVEMIR) 100 UNIT/ML Pen Inject 17-25 Units into the skin See admin instructions. Inject 25 units SQ in the morning and inject 17 units SQ at night    . insulin lispro (HUMALOG) 100 UNIT/ML injection Inject 5 Units into the skin at bedtime.    Marland Kitchen JANUVIA 100 MG tablet Take 100 mg by mouth at bedtime.     Marland Kitchen JARDIANCE 10 MG TABS tablet Take 10 mg by mouth daily.    Marland Kitchen levETIRAcetam (KEPPRA) 500 MG tablet Take 1 tablet (500 mg total) by mouth 2 (two) times daily. 180 tablet 4  . losartan (COZAAR) 50 MG tablet Take 50  mg by mouth at bedtime.     . metFORMIN (GLUCOPHAGE-XR) 500 MG 24 hr tablet Take 500 mg by mouth 2 (two) times daily.    . Multiple Vitamin (MULTIVITAMIN WITH MINERALS) TABS tablet Take 1 tablet by mouth at bedtime.    . Omega-3 Fatty  Acids (FISH OIL) 1200 MG CAPS Take 2,400 mg by mouth 2 (two) times daily.    Marland Kitchen omeprazole (PRILOSEC) 20 MG capsule Take 20 mg by mouth at bedtime.     . pioglitazone (ACTOS) 30 MG tablet Take 30 mg by mouth at bedtime.    Vladimir Faster Glycol-Propyl Glycol (SYSTANE) 0.4-0.3 % SOLN Place 1 drop into both eyes 2 (two) times daily.     . risedronate (ACTONEL) 150 MG tablet Take 150 mg by mouth every 30 (thirty) days.    Marland Kitchen sertraline (ZOLOFT) 100 MG tablet Take 100 mg by mouth at bedtime.    . nitroGLYCERIN (NITROSTAT) 0.4 MG SL tablet Place 1 tablet (0.4 mg total) under the tongue every 5 (five) minutes as needed for chest pain. 25 tablet 1   No current facility-administered medications for this visit.     REVIEW OF SYSTEMS:   Constitutional: Denies fevers, chills or abnormal night sweats  Eyes: Denies blurriness of vision, double vision or watery eyes Ears, nose, mouth, throat, and face: Denies mucositis or sore throat (+) dry mouth  Respiratory: Denies cough, dyspnea or wheezes Cardiovascular: Denies palpitation, chest discomfort or lower extremity swelling Gastrointestinal:  Denies nausea, heartburn or change in bowel habits Skin: Denies abnormal skin rashes Lymphatics: Denies new lymphadenopathy or easy bruising Neurological:Denies numbness, tingling or new weaknesses Behavioral/Psych: Mood is stable, no new changes  All other systems were reviewed with the patient and are negative.  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  Temp (!) 97.5 F (36.4 C) (Oral)   Resp 17   Ht '5\' 5"'$  (1.651 m)   Wt 118 lb 4.8 oz (53.7 kg)   SpO2 97%   BMI 19.69 kg/m   GENERAL:alert, no distress and comfortable SKIN: skin color, texture, turgor are normal, no rashes or significant lesions EYES: normal, conjunctiva are pink and non-injected, sclera clear OROPHARYNX:no exudate,  no erythema and lips, buccal mucosa, and tongue normal  NECK: supple, thyroid normal size, non-tender, without nodularity LYMPH:  no palpable lymphadenopathy in the cervical, axillary or inguinal LUNGS: clear to auscultation and percussion with normal breathing effort HEART: regular rate & rhythm and no murmurs and no lower extremity edema ABDOMEN: abdomen soft, non-tender and normal bowel sounds (+) Liver palpable 2 cm below ribcage, no tenderness Musculoskeletal:no cyanosis of digits and no clubbing  PSYCH: alert & oriented x 3 with fluent speech NEURO: no focal motor/sensory deficits Breasts: (+) Breast inspection showed them to be symmetrical with no nipple discharge. Palpation of the breasts and axilla revealed no obvious mass that I could appreciate. (+) left UOQ and left axilla incisions, healed well with mild tenderness. No palpable mass.  LABORATORY DATA:  I have reviewed the data as listed CBC Latest Ref Rng & Units 08/25/2018 05/25/2018 01/23/2018  WBC 4.0 - 10.5 K/uL 5.9 5.4 6.1  Hemoglobin 12.0 - 15.0 g/dL 16.3(H) 15.6 14.4  Hematocrit 36.0 - 46.0 % 51.6(H) 47.4(H) 44.0  Platelets 150 - 400 K/uL 110(L) 97(L) 96(L)    CMP Latest Ref Rng & Units 08/25/2018 05/25/2018 01/23/2018  Glucose 70 -  99 mg/dL 135(H) 160(H) 98  BUN 8 - 23 mg/dL '10 10 9  '$ Creatinine 0.44 - 1.00 mg/dL 0.73 0.70 0.68  Sodium 135 - 145 mmol/L 140 140 141  Potassium 3.5 - 5.1 mmol/L 4.2 4.2 3.9  Chloride 98 - 111 mmol/L 103 102 105  CO2 22 - 32 mmol/L '28 29 29  '$ Calcium 8.9 - 10.3 mg/dL 10.0 10.5(H) 10.5(H)  Total Protein 6.5 - 8.1 g/dL 7.6 7.3 7.3  Total Bilirubin 0.3 - 1.2 mg/dL 0.5 0.6 0.4  Alkaline Phos 38 - 126 U/L 60 69 61  AST 15 - 41 U/L '25 23 21  '$ ALT 0 - 44 U/L '24 22 20    '$ PATHOLOGY REPORT  Diagnosis 03/16/17 1. Breast, lumpectomy, Left - INVASIVE DUCTAL CARCINOMA, NOTTINGHAM GRADE 2 OF 3, 0.9 CM - DUCTAL CARCINOMA IN SITU - MARGINS UNINVOLVED BY CARCINOMA (0.3 CM POSTERIOR MARGIN) - PREVIOUS  BIOPSY SITE CHANGES - SEE ONCOLOGY TABLE BELOW 2. Lymph node, sentinel, biopsy, Left axillary #1 - NO CARCINOMA IDENTIFIED IN ONE LYMPH NODE (0/1) 3. Lymph node, sentinel, biopsy, Left axillary #2 - NO CARCINOMA IDENTIFIED IN ONE LYMPH NODE (0/1) 4. Lymph node, sentinel, biopsy, Left axillary #3 - NO CARCINOMA IDENTIFIED IN ONE LYMPH NODE (0/1) 5. Lymph node, sentinel, biopsy, Left axillary #4 - NO CARCINOMA IDENTIFIED IN ONE LYMPH NODE (0/1) Microscopic Comment 1. BREAST, INVASIVE TUMOR Procedure: Excision Laterality: Left Tumor Size: 0.9 x 0.8 x 0.7 cm Histologic Type: Invasive carcinoma of no special type (ductal, not otherwise specified) Grade: Nottingham Grade 2 Tubular Differentiation: 2 Nuclear Pleomorphism: 2 Mitotic Count: 2 Ductal Carcinoma in Situ (DCIS): Present (solid, cribriform patterns) Margins: Uninvolved by carcinoma Invasive carcinoma, distance from closest margin: 0.3 cm (posterior) DCIS, distance from closest margin: 0.4 cm (superior) Regional Lymph Nodes: Number of Lymph Nodes Examined: 4 Microscopic Comment(continued) Number of Sentinel Lymph Nodes Examined: 4 Lymph Nodes with Macrometastases: 0 Lymph Nodes with Micrometastases: 0 Lymph Nodes with Isolated Tumor Cells: 0 Breast Prognostic Profile: See [id number] (02/10/2017) Estrogen Receptor: Positive (100%, Strong) Progesterone Receptor: Positive (15%, Strong) Her2: Negative Ki-67: 15% Best tumor block for sendout testing: 1B Treatment Effect: No known presurgical therapy Pathologic Stage Classification (pTNM, AJCC 8th Edition): Primary Tumor: pT1b Regional Lymph Nodes: pN0 DAWN  Diagnosis 02/09/17 Breast, left, needle core biopsy - INVASIVE DUCTAL CARCINOMA. - DUCTAL CARCINOMA IN SITU. - SEE COMMENT. Microscopic Comment The carcinoma appears grade I - II. A breast prognostic profile will be performed and the results reported separately. The results were called to [facility] on  02/10/2017. Results: IMMUNOHISTOCHEMICAL AND MORPHOMETRIC ANALYSIS PERFORMED MANUALLY Estrogen Receptor: 100%, POSITIVE, STRONG STAINING INTENSITY Progesterone Receptor: 15%, POSITIVE, STRONG STAINING INTENSITY Proliferation Marker Ki67: 15% REFERENCE RANGE ESTROGEN RECEPTOR NEGATIVE 0% POSITIVE =>1% REFERENCE RANGE PROGESTERONE RECEPTOR NEGATIVE 0% POSITIVE =>1% All controls stained appropriately Results: HER2 - NEGATIVE RATIO OF HER2/CEP17 SIGNALS 1.24 AVERAGE HER2 COPY NUMBER PER CELL 1.80 Reference Range: NEGATIVE HER2/CEP17 Ratio <2.0 and average HER2 copy number <4.0 EQUIVOCAL HER2/CEP17 Ratio <2.0 and average HER2 copy number >=4.0 and <6.0   ONCOTYPE 03/16/17   PROCEDURES  Colonoscopy with Dr. Benson Norway on 06/09/18  IMPRESSION - Four 2 to 3 mm polyps in the sigmoid colon, in the transverse colon and in the ascending colon, removed with a cold snare. Resected and retrieved. - Diverticulosis in the sigmoid colon. ---ALL BENIGN   RADIOGRAPHIC STUDIES: I have personally reviewed the radiological images as listed and agreed with the findings in the report.  02/22/2018  CT Chest IMPRESSION: 1. Lung-RADS 3, probably benign findings. Short-term follow-up in 6 months is recommended with repeat low-dose chest CT without contrast (please use the following order, "CT CHEST LCS NODULE FOLLOW-UP W/O CM"). 2. Aortic Atherosclerosis (ICD10-I70.0) and Emphysema (ICD10-J43.9). 3. Calcifications noted within the RCA, LAD and left circumflex coronary arteries. 4. Mild aneurysmal dilatation of the ascending thoracic aorta.  02/03/2018 Mammogram Benign  ASSESSMENT & PLAN:  Monica [name] is 72 y.o. caucasian female who has a history of an MI, CHF and DM. She is here for her newly diagnosed Breast Cancer  1. 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 -the Oncotype Dx result was reviewed with her in details. She had intermediate risk based on the recurrence score 27, which predicts 10 year distant recurrence after 5 years of tamoxifen 18%. Given her advanced age, comorbidities, strong ER and PR expression in her tumor, we decided not to pursue adjuvant chemotherapy. -She completed radiation on 06/17/17 and tolerated very well with only mild reaction.  -She went to survivorship clinic in Dec 2018 -She started adjuvant anastrozole in late 06/2017 and has tolerated well with no issues so far. -She notes her 05/2018 Colonoscopy with Dr. Benson Norway shows her to have Diverticulosis with multiple benign polyps for which she is at high risk for colon cancer. She will continue to follow up with him and repeat colonoscopy in 3 years.  -She is clinically doing very well, asymptomatic. Lab reviewed, mild thrombocytopenia is stable, no other concerns.  No clinical concern for recurrence. Her physical exam did mild hepatomegaly without tenderness. Will monitor.  -Last mammogram on 02/03/2018 was benign. Next due in 01/2019 -continue adjuvant anastrozole, and breast cancer surveillance. -F/u in 6 months   2. DM -Much better controlled lately.  She is on insulin and multiple oral diabetic medications -Follows up with Dr. Maudie Mercury her PCP   3. 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  -No history of liver disease or alcohol abuse. 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 -PLT mildly improved to 110K today (08/25/18). We'll continue monitoring  4. Osteopenia  -Continue calcium and vitamin D -Based on her DEXA from 06/30/17 she is osteopenic with a left femur neck T-Score of -2.4  -We discussed the potential negative impact on her bone density from anastrozole.  We will follow-up her bone density scan every 2  years -she has started risedronate by her PCP, tolerating well, will continue.  5.  Depression and mood swing -She has become slightly more irritable since she started anastrozole - she is on Zoloft 100 mg daily, I recommend her to increase to 150 mg daily, I previously called in additional 50 mg daily dose for her. -She is doing well  6. Smoking and lung nodule, Emphysema  -She has heavy smoking history, has cut cigarettes to half pack a day -She underwent low-dose CT scan in 02/2018 for lung cancer screening, which showed several small pulmonary nodules, largest in the right upper lobe 7 mm, likely benign, will follow-up. -Screening CT Chest from 08/25/18 shows no evidence of malignancy but does show significant emphysema in her lungs. I reviewed the images myself and discussed with pt.  -She notes to trying to quit smoking. I encouraged her to continue   PLAN: -Continue  anastrozole  -Mammogram in 01/2019 -Lab and f/u in 6 months   All questions were answered. The patient knows to call the clinic with any problems, questions or concerns. I spent 20 minutes counseling the patient face to face. The total time spent in the appointment was 25 minutes and more than 50% was on counseling.  Oneal Deputy, am acting as scribe for Truitt Merle, MD.   I have reviewed the above documentation for accuracy and completeness, and I agree with the above.     Truitt Merle, MD 08/25/2018

## 2018-08-25 NOTE — Telephone Encounter (Signed)
Spoke with patient to inform of CT results.  No lung cancer and per NP return in one year for CT lung cancer screen.  Patient voiced understanding and thanks for call.

## 2018-08-25 NOTE — Telephone Encounter (Signed)
Printed avs and calender of upcoming appointment. Per 11/15 los

## 2018-08-25 NOTE — Telephone Encounter (Signed)
-----   Message from Gardenia Phlegm, NP sent at 08/25/2018  3:34 PM EST ----- No sign of lung cancer on CT .  Have patient return in 1 year for continued CT lung cancer screening ----- Message ----- From: Interface, Rad Results In Sent: 08/25/2018   1:20 PM EST To: Gardenia Phlegm, NP

## 2018-10-20 ENCOUNTER — Telehealth: Payer: Self-pay

## 2018-10-20 NOTE — Telephone Encounter (Signed)
Faxed signed order to [facility], sent to HIM for scanning to chart.

## 2018-10-25 NOTE — Progress Notes (Signed)
[facility]  PATIENT: Monica [name] DOB: [birth year]   REASON FOR VISIT: Follow-up for seizure disorder  HISTORY FROM: Patient   HISTORY OF PRESENT ILLNESS:UPDATE 1/16/2020CM Monica [name], 73 year old female returns for follow-up with seizure disorder with last seizure occurring in October 2018.  She is currently on Keppra 500 mg twice daily.  She denies any side effects to the medication.  She denies any interval medical issues.  She returns for reevaluation and refills    UPDATE 7/2/2019CM Monica [name], 73 year old female returns for follow-up for seizure disorder.  Last seizure occurred 07/28/2017.  She is currently taking levetiracetam 500 mg twice daily without side effects.  She returns for reevaluation  UPDATE (10/05/17, VRP): Since last visit, doing well. Tolerating LEV 500mg  twice a day. No alleviating or aggravating factors.   PRIOR HPI (08/02/17): 73 year old female here for evaluation of seizures.  History of diabetes, heart disease, breast cancer.  At age late 73 years old patient had a seizure, lips turn blue, no tongue biting or incontinence.  She was apparently treated with antiseizure medication for several years and then discontinued.  May 24, 2017 patient was at [facility], when all of a sudden she collapsed and had a seizure.  Blood glucose was checked within 15 minutes and was 52.  No warning symptoms.  She ate breakfast that morning but no lunch.  She was taken to the hospital and evaluated. July 28, 2017 patient was at home, when all of a sudden she collapsed.  She was found stiff all over, with some convulsions.  Family gave her some juice to drink and blood sugar after testing was 120.  She was confused afterwards.  No tongue biting or incontinence. Patient has diagnosis of breast cancer.  Her first radiation treatment was May 11, 2017.   REVIEW OF SYSTEMS: Full 14 system review of systems performed and notable only for those listed, all  others are neg:  Constitutional: Fatigue Cardiovascular: neg Ear/Nose/Throat: neg  Skin: neg Eyes: Light sensitivity  Respiratory: neg Gastroitestinal: neg  Hematology/Lymphatic: neg  Endocrine: neg Musculoskeletal:neg Allergy/Immunology: neg Neurological: Seizure disorder Psychiatric: neg Sleep : neg   ALLERGIES: Allergies  Allergen Reactions  . Ace Inhibitors Dermatitis and Cough  . Keflex [Cephalexin] Itching  . Phenobarbital Other (See Comments)    Black spots on hands and feet.  . Sulfate Nausea And Vomiting and Other (See Comments)    Dizziness   . Altace [Ramipril] Other (See Comments)    UNSPECIFIED REACTION      HOME MEDICATIONS: Outpatient Medications Prior to Visit  Medication Sig Dispense Refill  . acetaminophen (TYLENOL) 500 MG tablet Take 1,000 mg by mouth every 6 (six) hours as needed for moderate pain or headache.    . anastrozole (ARIMIDEX) 1 MG tablet Take 1 tablet (1 mg total) daily by mouth. 90 tablet 1  . anastrozole (ARIMIDEX) 1 MG tablet TAKE 1 TABLET BY MOUTH  DAILY 30 tablet 3  . aspirin EC 81 MG tablet Take 81 mg by mouth at bedtime.    Marland Kitchen atorvastatin (LIPITOR) 40 MG tablet Take 40 mg by mouth at bedtime.    . Calcium Carbonate-Vitamin D 600-400 MG-UNIT per tablet Take 1 tablet by mouth daily.     . carvedilol (COREG) 3.125 MG tablet Take 3.125 mg by mouth 2 (two) times daily.     . clonazePAM (KLONOPIN) 1 MG tablet Take 1 mg by mouth at bedtime.     Marland Kitchen glimepiride (AMARYL) 4 MG tablet  Take 4 mg by mouth 2 (two) times daily.    . halobetasol (ULTRAVATE) 0.05 % cream Apply 1 application topically 2 (two) times daily as needed (for rash).     . Insulin Detemir (LEVEMIR) 100 UNIT/ML Pen Inject 17-25 Units into the skin See admin instructions. Inject 25 units SQ in the morning and inject 17 units SQ at night    . insulin lispro (HUMALOG) 100 UNIT/ML injection Inject 5 Units into the skin at bedtime.    Marland Kitchen JANUVIA 100 MG tablet Take 100 mg by mouth at  bedtime.     Marland Kitchen JARDIANCE 10 MG TABS tablet Take 10 mg by mouth daily.    Marland Kitchen levETIRAcetam (KEPPRA) 500 MG tablet Take 1 tablet (500 mg total) by mouth 2 (two) times daily. 180 tablet 4  . losartan (COZAAR) 50 MG tablet Take 50 mg by mouth at bedtime.     . metFORMIN (GLUCOPHAGE-XR) 500 MG 24 hr tablet Take 500 mg by mouth 2 (two) times daily.    . Multiple Vitamin (MULTIVITAMIN WITH MINERALS) TABS tablet Take 1 tablet by mouth at bedtime.    . nitroGLYCERIN (NITROSTAT) 0.4 MG SL tablet Place 1 tablet (0.4 mg total) under the tongue every 5 (five) minutes as needed for chest pain. 25 tablet 1  . Omega-3 Fatty Acids (FISH OIL) 1200 MG CAPS Take 2,400 mg by mouth 2 (two) times daily.    Marland Kitchen omeprazole (PRILOSEC) 20 MG capsule Take 20 mg by mouth at bedtime.     . pioglitazone (ACTOS) 30 MG tablet Take 30 mg by mouth at bedtime.    Vladimir Faster Glycol-Propyl Glycol (SYSTANE) 0.4-0.3 % SOLN Place 1 drop into both eyes 2 (two) times daily.     . risedronate (ACTONEL) 150 MG tablet Take 150 mg by mouth every 30 (thirty) days.    Marland Kitchen sertraline (ZOLOFT) 100 MG tablet Take 100 mg by mouth at bedtime.     No facility-administered medications prior to visit.     PAST MEDICAL HISTORY: Past Medical History:  Diagnosis Date  . CAD (coronary artery disease)    Cypher stent 09/2002  . Cancer [facility]) 2018   left breast, radiation therapy  . CHF (congestive heart failure) ([place])   . Depression   . Diabetes mellitus ([place])   . Hyperlipidemia   . Hypertension   . Myocardial infarction ([place])    2003,went into cardiac shock  . Osteoporosis   . Seizures ([place])    1st seizure age late 38's; most recent 07/28/17  . Sinusitis     PAST SURGICAL HISTORY: Past Surgical History:  Procedure Laterality Date  . ABDOMINAL HYSTERECTOMY    . APPENDECTOMY    . BREAST LUMPECTOMY WITH RADIOACTIVE SEED AND SENTINEL LYMPH NODE BIOPSY Left 03/16/2017   Procedure: INJECT BLUE DYE LEFT BREAST, LEFT BREAST LUMPECTOMY WITH  RADIOACTIVE SEED AND LEFT AXILLARY DEEP SENTINEL LYMPH NODE  BIOPSY ADJACENT TISSUE TRANSFER;  Surgeon: [name], MD;  Location: [facility];  Service: General;  Laterality: Left;  . CARPAL TUNNEL RELEASE Bilateral   . COLONOSCOPY WITH PROPOFOL N/A 05/30/2015   Procedure: COLONOSCOPY WITH PROPOFOL;  Surgeon: Carol Ada, MD;  Location: [facility];  Service: Endoscopy;  Laterality: N/A;  . COLONOSCOPY WITH PROPOFOL N/A 06/09/2018   Procedure: COLONOSCOPY WITH PROPOFOL;  Surgeon: Carol Ada, MD;  Location: [facility];  Service: Endoscopy;  Laterality: N/A;  . CORONARY ANGIOPLASTY WITH STENT PLACEMENT  10-02-2002  . Exploratory abdominal     Bleeding in abdomen  after tubal  . POLYPECTOMY  06/09/2018   Procedure: POLYPECTOMY;  Surgeon: Carol Ada, MD;  Location: [facility];  Service: Endoscopy;;  . TUBAL LIGATION    . UMBILICAL HERNIA REPAIR      FAMILY HISTORY: Family History  Problem Relation Age of Onset  . Dementia Mother   . Kidney disease Mother   . Heart attack Father 76       Died age 74  . Heart attack Brother 16  . Heart attack Brother 57    SOCIAL HISTORY: Social History   Socioeconomic History  . Marital status: Married    Spouse name: Sonia Side  . Number of children: 3  . Years of education: 22  . Highest education level: Not on file  Occupational History  . Not on file  Social Needs  . Financial resource strain: Not on file  . Food insecurity:    Worry: Not on file    Inability: Not on file  . Transportation needs:    Medical: Not on file    Non-medical: Not on file  Tobacco Use  . Smoking status: Current Every Day Smoker    Packs/day: 2.00    Years: 30.00    Pack years: 60.00    Types: Cigarettes    Last attempt to quit: 03/09/2017    Years since quitting: 1.6  . Smokeless tobacco: Never Used  . Tobacco comment: e cig, 08/02/17 1 PPD  Substance and Sexual Activity  . Alcohol use: No    Alcohol/week: 0.0 standard drinks  . Drug use: No  .  Sexual activity: Not on file  Lifestyle  . Physical activity:    Days per week: Not on file    Minutes per session: Not on file  . Stress: Not on file  Relationships  . Social connections:    Talks on phone: Not on file    Gets together: Not on file    Attends religious service: Not on file    Active member of club or organization: Not on file    Attends meetings of clubs or organizations: Not on file    Relationship status: Not on file  . Intimate partner violence:    Fear of current or ex partner: Not on file    Emotionally abused: Not on file    Physically abused: Not on file    Forced sexual activity: Not on file  Other Topics Concern  . Not on file  Social History Narrative   Lives with husband.    Caffeine-      PHYSICAL EXAM  Vitals:   04/11/18 1052  BP: (!) 102/64  Pulse: (!) 51  Weight: 116 lb 12.8 oz (53 kg)  Height: 5\' 5"  (1.651 m)   There is no height or weight on file to calculate BMI.  Generalized: Well developed, in no acute distress  Head: normocephalic and atraumatic,. Oropharynx benign  Neck: Supple, Musculoskeletal: No deformity   Neurological examination   Mentation: Alert oriented to time, place, history taking. Attention span and concentration appropriate. Recent and remote memory intact.  Follows all commands speech and language fluent.   Cranial nerve II-XII: .Pupils were equal round reactive to light extraocular movements were full, visual field were full on confrontational test. Facial sensation and strength were normal. hearing was intact to finger rubbing bilaterally. Uvula tongue midline. head turning and shoulder shrug were normal and symmetric.Tongue protrusion into cheek strength was normal. Motor: normal bulk and tone, full strength in the BUE, BLE,  Sensory: normal and symmetric to light touch,  Coordination: finger-nose-finger, heel-to-shin bilaterally, no dysmetria Reflexes: Symmetric upper and lower plantar responses were flexor  bilaterally. Gait and Station: Rising up from seated position without assistance, normal stance,  moderate stride, good arm swing, smooth turning, able to perform tiptoe, and heel walking without difficulty. Tandem gait is steady  DIAGNOSTIC DATA (LABS, IMAGING, TESTING) - I reviewed patient records, labs, notes, testing and imaging myself where available.  Lab Results  Component Value Date   WBC 5.9 08/25/2018   HGB 16.3 (H) 08/25/2018   HCT 51.6 (H) 08/25/2018   MCV 77.0 (L) 08/25/2018   PLT 110 (L) 08/25/2018      Component Value Date/Time   NA 140 08/25/2018 1031   NA 139 08/25/2017 1224   K 4.2 08/25/2018 1031   K 4.3 08/25/2017 1224   CL 103 08/25/2018 1031   CO2 28 08/25/2018 1031   CO2 26 08/25/2017 1224   GLUCOSE 135 (H) 08/25/2018 1031   GLUCOSE 149 (H) 08/25/2017 1224   BUN 10 08/25/2018 1031   BUN 10.1 08/25/2017 1224   CREATININE 0.73 08/25/2018 1031   CREATININE 0.7 08/25/2017 1224   CALCIUM 10.0 08/25/2018 1031   CALCIUM 9.4 08/25/2017 1224   PROT 7.6 08/25/2018 1031   PROT 7.2 08/25/2017 1224   ALBUMIN 4.2 08/25/2018 1031   ALBUMIN 4.0 08/25/2017 1224   AST 25 08/25/2018 1031   AST 24 08/25/2017 1224   ALT 24 08/25/2018 1031   ALT 33 08/25/2017 1224   ALKPHOS 60 08/25/2018 1031   ALKPHOS 72 08/25/2017 1224   BILITOT 0.5 08/25/2018 1031   BILITOT 0.49 08/25/2017 1224   GFRNONAA >60 08/25/2018 1031   GFRAA >60 08/25/2018 1031    Lab Results  Component Value Date   HGBA1C 8.5 (H) 03/09/2017    ASSESSMENT AND PLAN  73 y.o. year old female here with 3 seizures in life.  One seizure may have been associated with hypoglycemia.  The other 2 seizures were not associated with any triggering factors.  Last seizure 07/28/17.   PLAN:Continue Keppra 500 mg twice a day  Call for any seizure activity Stay well-hydrated Follow up yearly I spent 15 minutes of face to face time with patient. Greater than 50% of time was spent in counseling and coordination  of care with patient.  Dennie Bible, [facility], [facility], APRN  [facility] [address], [place], [place] [address] [phone]

## 2018-10-26 ENCOUNTER — Encounter: Payer: Self-pay | Admitting: Nurse Practitioner

## 2018-10-26 ENCOUNTER — Ambulatory Visit: Payer: Medicare Other | Admitting: Nurse Practitioner

## 2018-10-26 VITALS — BP 108/62 | HR 57 | Ht 65.0 in | Wt 116.2 lb

## 2018-10-26 DIAGNOSIS — G40909 Epilepsy, unspecified, not intractable, without status epilepticus: Secondary | ICD-10-CM

## 2018-10-26 MED ORDER — LEVETIRACETAM 500 MG PO TABS: 500.0000 mg | ORAL_TABLET | Freq: Two times a day (BID) | ORAL | 3 refills | Status: DC

## 2018-10-26 NOTE — Progress Notes (Signed)
I reviewed note and agree with plan.   Penni Bombard, MD 6/86/1683, 7:29 PM Certified in Neurology, Neurophysiology and Neuroimaging  [facility] [address] [place], [place] [address] [phone]

## 2018-10-26 NOTE — Patient Instructions (Signed)
Continue Keppra 500 mg twice a day  Call for any seizure activity Stay well-hydrated Follow up yearly

## 2018-11-17 ENCOUNTER — Telehealth: Payer: Self-pay

## 2018-11-17 ENCOUNTER — Telehealth: Payer: Self-pay | Admitting: Hematology

## 2018-11-17 NOTE — Telephone Encounter (Signed)
Patient calls stating she had bloodwork done by Dr. Shelia Media and he told her her RBCs are high and her WBCs are really low.  She wants Dr. Ernestina Penna opinion.   [phone]

## 2018-11-17 NOTE — Telephone Encounter (Signed)
I received patient's recent lab which was done with her primary care physician.  Her CBC showed WBC 7.9, hemoglobin 16.4, hematocrit 49.8%, platelet 107k.  Patient was concerned about her abnormal CBC.  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  Her slightly elevated hemoglobin is likely related to her extensive smoking history and lung disease.  I again encouraged her to quit smoking.  Questions answered, she appreciated call.  Truitt Merle  11/17/2018

## 2018-12-07 ENCOUNTER — Other Ambulatory Visit: Payer: Self-pay | Admitting: Hematology

## 2018-12-07 DIAGNOSIS — E2839 Other primary ovarian failure: Secondary | ICD-10-CM

## 2019-02-19 ENCOUNTER — Telehealth: Payer: Self-pay

## 2019-02-19 NOTE — Telephone Encounter (Signed)
Faxed signed order back to [facility] for diagnostic mammogram, sent to HIM for scan to chart.

## 2019-02-22 ENCOUNTER — Telehealth: Payer: Self-pay | Admitting: Hematology

## 2019-02-22 NOTE — Telephone Encounter (Signed)
Called patient regarding upcoming Webex appointment, patient does not have access for Webex and needs this to be a telephone visit.

## 2019-02-22 NOTE — Progress Notes (Signed)
[place]   Telephone:([phone]) [phone] Fax:(336) [phone]   Clinic Follow up Note   Patient Care Team: Jani Gravel, MD as PCP - General (Internal Medicine) [name], MD as Consulting Physician (General Surgery) Truitt Merle, MD as Consulting Physician (Hematology) Kyung Rudd, MD as Consulting Physician (Radiation Oncology) Delice Bison Charlestine Massed, NP as Nurse Practitioner (Hematology and Oncology)   I connected with Monica [name] on 02/23/2019 at 11:15 AM EDT by telephone visit and verified that I am speaking with the correct person using two identifiers.  I discussed the limitations, risks, security and privacy concerns of performing an evaluation and management service by telephone and the availability of in person appointments. I also discussed with the patient that there may be a patient responsible charge related to this service. The patient expressed understanding and agreed to proceed.   Patient's location:  Her home  Provider's location:  My Office   CHIEF COMPLAINT: Follow up left Breast cancer   SUMMARY OF ONCOLOGIC HISTORY: Oncology History   Cancer Staging Malignant neoplasm of upper-inner quadrant of left breast in female, estrogen receptor positive ([place]) Staging form: Breast, AJCC 8th Edition - Clinical stage from 02/16/2017: Stage IA (cT1b, cN0, cM0, G2, ER: Positive, PR: Positive, HER2: Negative) - Unsigned Staging comments: Staged at breast conference  - Pathologic stage from 03/16/2017: Stage IA (pT1b, pN0, cM0, G2, ER: Positive, PR: Positive, HER2: Negative, Oncotype DX score: 27) - Signed by Truitt Merle, MD on 04/24/2017       Malignant neoplasm of upper-inner quadrant of left breast in female, estrogen receptor positive ([place])   02/09/2017 Initial Diagnosis    Malignant neoplasm of upper-inner quadrant of left breast in female, estrogen receptor positive ([place])    02/09/2017 Initial Biopsy    Diagnosis Breast, left, needle core biopsy - INVASIVE  DUCTAL CARCINOMA, 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 Dalbert Batman     03/16/2017 Pathology Results    PATHOLOGY REPORT Diagnosis 03/16/17 1. Breast, lumpectomy, Left - INVASIVE DUCTAL CARCINOMA, NOTTINGHAM GRADE 2 OF 3, 0.9 CM - DUCTAL CARCINOMA IN SITU - MARGINS UNINVOLVED BY CARCINOMA (0.3 CM POSTERIOR MARGIN) - PREVIOUS BIOPSY SITE CHANGES - SEE ONCOLOGY TABLE BELOW 2. Lymph node, sentinel, biopsy, Left axillary #1 - NO CARCINOMA IDENTIFIED IN ONE LYMPH NODE (0/1) 3. Lymph node, sentinel, biopsy, Left axillary #2 - NO CARCINOMA IDENTIFIED IN ONE LYMPH NODE (0/1) 4. Lymph node, sentinel, biopsy, Left axillary #3 - NO CARCINOMA IDENTIFIED IN ONE LYMPH NODE (0/1) 5. Lymph node, sentinel, biopsy, Left axillary #4 - NO CARCINOMA IDENTIFIED IN ONE LYMPH NODE (0/1)     03/16/2017 Oncotype testing    Recurrance score of 27 with a 18% distance recurrance in the net 10 years with Tamoxifen alone     05/19/2017 - 06/16/2017 Radiation Therapy    Radiation with Dr. Lisbeth Renshaw    06/2017 -  Anti-estrogen oral therapy    anastrozole 1 mg once a day starting late 06/2017        CURRENT THERAPY:  anastrozole 1 mg once a day starting 06/2017   INTERVAL HISTORY:  Monica [name] is here for a follow up of left breast cancer.  She was able to identify herself by birth date. She notes she is doing well with no new changes. She is taking anastrozole and tolerating well with no hot flashes. She does need a refill. She notes her BG has been doing well. She denies any change in her weight. She is overall doing well.    REVIEW OF SYSTEMS:   Constitutional: Denies fevers,  chills or abnormal weight loss Eyes: Denies blurriness of vision Ears, nose, mouth, throat, and face: Denies mucositis or sore throat Respiratory: Denies cough, dyspnea or wheezes Cardiovascular: Denies palpitation, chest discomfort or lower extremity swelling Gastrointestinal:  Denies nausea, heartburn or change in bowel habits Skin: Denies abnormal skin rashes Lymphatics: Denies new lymphadenopathy or easy bruising Neurological:Denies numbness, tingling or new weaknesses Behavioral/Psych: Mood is stable, no new changes  All other systems were reviewed with the patient and are negative.  MEDICAL HISTORY:  Past Medical History:  Diagnosis Date  . CAD (coronary artery disease)    Cypher stent 09/2002  . Cancer [facility]) 2018   left breast, radiation therapy  . CHF (congestive heart failure) ([place])   . Depression   . Diabetes mellitus ([place])   . Hyperlipidemia   . Hypertension   . Myocardial infarction ([place])    2003,went into cardiac shock  . Osteoporosis   . Seizures ([place])    1st seizure age late 52's; most recent 07/28/17  . Sinusitis     SURGICAL HISTORY: Past Surgical History:  Procedure Laterality Date  . ABDOMINAL HYSTERECTOMY    . APPENDECTOMY    . BREAST LUMPECTOMY WITH RADIOACTIVE SEED AND SENTINEL LYMPH NODE BIOPSY Left 03/16/2017   Procedure: INJECT BLUE DYE LEFT BREAST, LEFT BREAST LUMPECTOMY WITH RADIOACTIVE SEED AND LEFT AXILLARY DEEP SENTINEL LYMPH NODE  BIOPSY ADJACENT TISSUE TRANSFER;  Surgeon: [name], MD;  Location: [place];  Service: General;  Laterality: Left;  . CARPAL TUNNEL RELEASE Bilateral   . COLONOSCOPY WITH PROPOFOL N/A 05/30/2015   Procedure: COLONOSCOPY WITH PROPOFOL;  Surgeon: Carol Ada, MD;  [facility];  Service: Endoscopy;  Laterality: N/A;  . COLONOSCOPY WITH PROPOFOL N/A 06/09/2018   Procedure: COLONOSCOPY WITH PROPOFOL;  Surgeon: Carol Ada, MD;  [facility];  Service: Endoscopy;  Laterality: N/A;  . CORONARY  ANGIOPLASTY WITH STENT PLACEMENT  10-02-2002  . Exploratory abdominal     Bleeding in abdomen after tubal  . POLYPECTOMY  06/09/2018   Procedure: POLYPECTOMY;  Surgeon: Carol Ada, MD;  [facility];  Service: Endoscopy;;  . TUBAL LIGATION    . UMBILICAL HERNIA REPAIR      I have reviewed the social history and family history with the patient and they are unchanged from previous note.  ALLERGIES:  is allergic to ace inhibitors; keflex [cephalexin]; phenobarbital; sulfate; and altace [ramipril].  MEDICATIONS:  Current Outpatient Medications  Medication Sig Dispense Refill  . acetaminophen (TYLENOL) 500 MG tablet Take 1,000 mg by mouth every 6 (six) hours as needed for moderate pain or headache.    . anastrozole (ARIMIDEX) 1 MG tablet TAKE 1 TABLET BY MOUTH ONCE DAILY 30 tablet 0  . anastrozole (ARIMIDEX) 1 MG tablet Take 1 tablet (1 mg total) by mouth daily. 90 tablet 1  . aspirin EC 81 MG tablet Take 81 mg by mouth at bedtime.    Marland Kitchen atorvastatin (LIPITOR) 40 MG tablet Take 40 mg by mouth at bedtime.    . Calcium Carbonate-Vitamin D 600-400 MG-UNIT per tablet Take 1 tablet by mouth daily.     Marland Kitchen  carvedilol (COREG) 3.125 MG tablet Take 3.125 mg by mouth 2 (two) times daily.     . clonazePAM (KLONOPIN) 1 MG tablet Take 1 mg by mouth at bedtime.     Marland Kitchen glimepiride (AMARYL) 4 MG tablet Take 4 mg by mouth 2 (two) times daily.    . halobetasol (ULTRAVATE) 0.05 % cream Apply 1 application topically 2 (two) times daily as needed (for rash).     . Insulin Detemir (LEVEMIR) 100 UNIT/ML Pen Inject 17-25 Units into the skin See admin instructions. Inject 25 units SQ in the morning and inject 17 units SQ at night    . insulin lispro (HUMALOG) 100 UNIT/ML injection Inject 5 Units into the skin at bedtime.    Marland Kitchen JANUVIA 100 MG tablet Take 100 mg by mouth at bedtime.     Marland Kitchen JARDIANCE 10 MG TABS tablet Take 10 mg by mouth daily.    Marland Kitchen levETIRAcetam (KEPPRA) 500 MG tablet Take 1 tablet (500 mg  total) by mouth 2 (two) times daily. 180 tablet 3  . losartan (COZAAR) 50 MG tablet Take 50 mg by mouth at bedtime.     . metFORMIN (GLUCOPHAGE-XR) 500 MG 24 hr tablet Take 500 mg by mouth 2 (two) times daily.    . Multiple Vitamin (MULTIVITAMIN WITH MINERALS) TABS tablet Take 1 tablet by mouth at bedtime.    . nitroGLYCERIN (NITROSTAT) 0.4 MG SL tablet Place 1 tablet (0.4 mg total) under the tongue every 5 (five) minutes as needed for chest pain. 25 tablet 1  . Omega-3 Fatty Acids (FISH OIL) 1200 MG CAPS Take 2,400 mg by mouth 2 (two) times daily.    Marland Kitchen omeprazole (PRILOSEC) 20 MG capsule Take 20 mg by mouth at bedtime.     . pioglitazone (ACTOS) 30 MG tablet Take 30 mg by mouth at bedtime.    Vladimir Faster Glycol-Propyl Glycol (SYSTANE) 0.4-0.3 % SOLN Place 1 drop into both eyes 2 (two) times daily.     . risedronate (ACTONEL) 150 MG tablet Take 150 mg by mouth every 30 (thirty) days.    Marland Kitchen sertraline (ZOLOFT) 100 MG tablet Take 100 mg by mouth at bedtime.     No current facility-administered medications for this visit.     PHYSICAL EXAMINATION: ECOG PERFORMANCE STATUS: 0 - Asymptomatic  No vitals taken today, Exam not performed today  LABORATORY DATA:  I have reviewed the data as listed CBC Latest Ref Rng & Units 08/25/2018 05/25/2018 01/23/2018  WBC 4.0 - 10.5 K/uL 5.9 5.4 6.1  Hemoglobin 12.0 - 15.0 g/dL 16.3(H) 15.6 14.4  Hematocrit 36.0 - 46.0 % 51.6(H) 47.4(H) 44.0  Platelets 150 - 400 K/uL 110(L) 97(L) 96(L)     CMP Latest Ref Rng & Units 08/25/2018 05/25/2018 01/23/2018  Glucose 70 - 99 mg/dL 135(H) 160(H) 98  BUN 8 - 23 mg/dL '10 10 9  '$ Creatinine 0.44 - 1.00 mg/dL 0.73 0.70 0.68  Sodium 135 - 145 mmol/L 140 140 141  Potassium 3.5 - 5.1 mmol/L 4.2 4.2 3.9  Chloride 98 - 111 mmol/L 103 102 105  CO2 22 - 32 mmol/L '28 29 29  '$ Calcium 8.9 - 10.3 mg/dL 10.0 10.5(H) 10.5(H)  Total Protein 6.5 - 8.1 g/dL 7.6 7.3 7.3  Total Bilirubin 0.3 - 1.2 mg/dL 0.5 0.6 0.4  Alkaline Phos 38 -  126 U/L 60 69 61  AST 15 - 41 U/L '25 23 21  '$ ALT 0 - 44 U/L '24 22 20      '$ RADIOGRAPHIC STUDIES:  I have personally reviewed the radiological images as listed and agreed with the findings in the report. No results found.   ASSESSMENT & PLAN:  Monica [name] is a 73 y.o. female with   1. 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 She is s/p left breast lumpectomy and adjuvant radiation.  -the Oncotype Dx result was reviewed with her in details. She had intermediate risk based on the recurrence score 27, which predicts 10 year distant recurrence after 5 years of tamoxifen 18%. Given her advanced age, comorbidities, strong ER and PR expression in her tumor, we decided not to pursue adjuvant chemotherapy. -She started antiestrogen therapy with Anastrozole in 06/2017. She has tolerated well with no issues so far. -She is clinically doing very well, asymptomatic. Her 01/2018 Mammogram was unremarkable. No clinical concern for recurrence. -continue adjuvant anastrozole, and breast cancer surveillance. She is due for mammogram.  -F/u in 3-4 months   2. DM -Much better controlled lately.  She is on insulin and multiple oral diabetic medications -Follows up with Dr. Maudie Mercury her PCP -CBC in 11/2018 shows PLT stable   3. 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  -No history of liver disease or alcohol abuse. 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  4. Osteopenia  -Continue calcium and vitamin D -Based on her DEXA from 06/30/17 she is osteopenic with a left femur neck T-Score of -2.4  -We discussed the potential negative impact on her bone density from anastrozole.  We will follow-up her bone density scan every 2 years -she has started risedronate by her PCP, tolerating well,  will continue.  -Next DEXA in 06/2019  5. Depression and mood swing -She has become slightly more irritable since she started anastrozole, much improved now  - she is on Zoloft 100 mg daily, doing well.   6. Smoking and lung nodule, Emphysema  -She has heavy smoking history, has cut cigarettes to half pack a day -She underwent low-dose CT scan in 02/2018 for lung cancer screening, which showed several small pulmonary nodules, largest in the right upper lobe 7 mm, likely benign, will follow-up. -Screening CT Chest from 08/25/18 shows no evidence of malignancy but does show significant emphysema in her lungs. I reviewed the images myself and discussed with pt.  -She has been trying to quit smoking. I encouraged her to continue.   7. Cancer screening  --05/2018 Colonoscopy with Dr. Benson Norway shows her to have Diverticulosis with multiple benign polyps for which she is at high risk for colon cancer. She will continue to follow up with him and repeat colonoscopy in 3 years.   PLAN: -She is clinically doing well.  -Continue anastrozole refilled today  -Mammogram and DEXA in 06/2019 -Lab and f/u in 3-4 months    No problem-specific Assessment & Plan notes found for this encounter.   No orders of the defined types were placed in this encounter.  I discussed the assessment and treatment plan with the patient. The patient was provided an opportunity to ask questions and all were answered. The patient agreed with the plan and demonstrated an understanding of the instructions.  The patient was advised to call back or seek an in-person evaluation if the symptoms worsen or if the condition fails to improve as anticipated.  I provided 12 minutes of non face-to-face telephone  visit time during this encounter, and > 50% was spent counseling as documented under my assessment & plan.    Truitt Merle, MD 02/23/2019   I, Joslyn Devon, am acting as scribe for Truitt Merle, MD.   I have reviewed the above  documentation for accuracy and completeness, and I agree with the above.

## 2019-02-23 ENCOUNTER — Telehealth: Payer: Self-pay | Admitting: Hematology

## 2019-02-23 ENCOUNTER — Inpatient Hospital Stay: Payer: Medicare Other | Attending: Hematology | Admitting: Hematology

## 2019-02-23 ENCOUNTER — Other Ambulatory Visit: Payer: Self-pay

## 2019-02-23 DIAGNOSIS — J439 Emphysema, unspecified: Secondary | ICD-10-CM

## 2019-02-23 DIAGNOSIS — M8580 Other specified disorders of bone density and structure, unspecified site: Secondary | ICD-10-CM

## 2019-02-23 DIAGNOSIS — E118 Type 2 diabetes mellitus with unspecified complications: Secondary | ICD-10-CM

## 2019-02-23 DIAGNOSIS — Z794 Long term (current) use of insulin: Secondary | ICD-10-CM

## 2019-02-23 DIAGNOSIS — Z923 Personal history of irradiation: Secondary | ICD-10-CM

## 2019-02-23 DIAGNOSIS — E2839 Other primary ovarian failure: Secondary | ICD-10-CM

## 2019-02-23 DIAGNOSIS — F17210 Nicotine dependence, cigarettes, uncomplicated: Secondary | ICD-10-CM

## 2019-02-23 DIAGNOSIS — Z79899 Other long term (current) drug therapy: Secondary | ICD-10-CM

## 2019-02-23 DIAGNOSIS — C50212 Malignant neoplasm of upper-inner quadrant of left female breast: Secondary | ICD-10-CM | POA: Diagnosis not present

## 2019-02-23 DIAGNOSIS — Z170 Estrogen receptor positive status [ER+]: Secondary | ICD-10-CM | POA: Diagnosis not present

## 2019-02-23 DIAGNOSIS — Z7982 Long term (current) use of aspirin: Secondary | ICD-10-CM

## 2019-02-23 MED ORDER — ANASTROZOLE 1 MG PO TABS: 1.0000 mg | ORAL_TABLET | Freq: Every day | ORAL | 1 refills | Status: DC

## 2019-02-23 NOTE — Telephone Encounter (Signed)
Scheduled appt per 5/15 los.  A calendar will be mailed out.

## 2019-02-24 ENCOUNTER — Encounter: Payer: Self-pay | Admitting: Hematology

## 2019-03-08 ENCOUNTER — Telehealth: Payer: Self-pay

## 2019-03-08 NOTE — Telephone Encounter (Signed)
Faxed signed order for diagnostic mammogram to [facility] on 5/26, sent to HIM for scanning to chart.

## 2019-04-26 ENCOUNTER — Telehealth: Payer: Self-pay | Admitting: Cardiology

## 2019-04-26 ENCOUNTER — Telehealth: Payer: Self-pay | Admitting: *Deleted

## 2019-04-26 NOTE — Telephone Encounter (Signed)
    COVID-19 Pre-Screening Questions:  . In the past 7 to 10 days have you had a cough,  shortness of breath, headache, congestion, fever (100 or greater) body aches, chills, sore throat, or sudden loss of taste or sense of smell? . Have you been around anyone with known Covid 19. . Have you been around anyone who is awaiting Covid 19 test results in the past 7 to 10 days? . Have you been around anyone who has been exposed to Covid 19, or has mentioned symptoms of Covid 19 within the past 7 to 10 days?  If you have any concerns/questions about symptoms patients report during screening (either on the phone or at threshold). Contact the provider seeing the patient or DOD for further guidance.  If neither are available contact a member of the leadership team.   Above answered no by patient for tomorrow appointment with Dr Percival Spanish

## 2019-04-26 NOTE — Progress Notes (Signed)
Cardiology Office Note   Date:  04/27/2019   ID:  Monica [name], DOB 28-May-1946, MRN [id number]  PCP:  Jani Gravel, MD  Cardiologist:   Minus Breeding, MD    Chief Complaint  Patient presents with  . Coronary Artery Disease      History of Present Illness: Monica [name] is a 73 y.o. female who presents for evaluation of coronary artery disease. She had previous stenting to an LAD. This was in 2003 with DES.  In 2016  POET (Plain Old Exercise Treadmill) was negative.  She returns for follow up.  Since I last saw her she has done very well.  She had to put the dog down unfortunately.  She is having a ground hog problem.  But otherwise she is doing okay.  She walks for exercise.  She does her chores. The patient denies any new symptoms such as chest discomfort, neck or arm discomfort. There has been no new shortness of breath, PND or orthopnea. There have been no reported palpitations, presyncope or syncope.    Past Medical History:  Diagnosis Date  . CAD (coronary artery disease)    Cypher stent 09/2002  . Cancer [facility]) 2018   left breast, radiation therapy  . CHF (congestive heart failure) ([place])   . Depression   . Diabetes mellitus ([place])   . Hyperlipidemia   . Hypertension   . Myocardial infarction ([place])    2003,went into cardiac shock  . Osteoporosis   . Seizures ([place])    1st seizure age late 7's; most recent 07/28/17  . Sinusitis     Past Surgical History:  Procedure Laterality Date  . ABDOMINAL HYSTERECTOMY    . APPENDECTOMY    . BREAST LUMPECTOMY WITH RADIOACTIVE SEED AND SENTINEL LYMPH NODE BIOPSY Left 03/16/2017   Procedure: INJECT BLUE DYE LEFT BREAST, LEFT BREAST LUMPECTOMY WITH RADIOACTIVE SEED AND LEFT AXILLARY DEEP SENTINEL LYMPH NODE  BIOPSY ADJACENT TISSUE TRANSFER;  Surgeon: [name], MD;  Location: [place];  Service: General;  Laterality: Left;  . CARPAL TUNNEL RELEASE Bilateral   . COLONOSCOPY WITH PROPOFOL N/A 05/30/2015   Procedure:  COLONOSCOPY WITH PROPOFOL;  Surgeon: Carol Ada, MD;  [facility];  Service: Endoscopy;  Laterality: N/A;  . COLONOSCOPY WITH PROPOFOL N/A 06/09/2018   Procedure: COLONOSCOPY WITH PROPOFOL;  Surgeon: Carol Ada, MD;  [facility];  Service: Endoscopy;  Laterality: N/A;  . CORONARY ANGIOPLASTY WITH STENT PLACEMENT  10-02-2002  . Exploratory abdominal     Bleeding in abdomen after tubal  . POLYPECTOMY  06/09/2018   Procedure: POLYPECTOMY;  Surgeon: Carol Ada, MD;  [facility];  Service: Endoscopy;;  . TUBAL LIGATION    . UMBILICAL HERNIA REPAIR       Current Outpatient Medications  Medication Sig Dispense Refill  . acetaminophen (TYLENOL) 500 MG tablet Take 1,000 mg by mouth every 6 (six) hours as needed for moderate pain or headache.    . anastrozole (ARIMIDEX) 1 MG tablet Take 1 tablet (1 mg total) by mouth daily. 90 tablet 1  . aspirin EC 81 MG tablet Take 81 mg by mouth at bedtime.    Marland Kitchen atorvastatin (LIPITOR) 40 MG tablet Take 40 mg by mouth at bedtime.    . Calcium Carbonate-Vitamin D 600-400 MG-UNIT per tablet Take 1 tablet by mouth daily.     . carvedilol (COREG) 3.125 MG tablet Take 3.125 mg by mouth 2 (two) times daily.     . clonazePAM (KLONOPIN)  1 MG tablet Take 1 mg by mouth at bedtime.     Marland Kitchen glimepiride (AMARYL) 4 MG tablet Take 4 mg by mouth 2 (two) times daily.    . halobetasol (ULTRAVATE) 0.05 % cream Apply 1 application topically 2 (two) times daily as needed (for rash).     . Insulin Detemir (LEVEMIR) 100 UNIT/ML Pen Inject 17-25 Units into the skin See admin instructions. Inject 25 units SQ in the morning and inject 17 units SQ at night    . insulin lispro (HUMALOG) 100 UNIT/ML injection Inject 5 Units into the skin at bedtime.    Marland Kitchen JANUVIA 100 MG tablet Take 100 mg by mouth at bedtime.     Marland Kitchen JARDIANCE 10 MG TABS tablet Take 10 mg by mouth daily.    Marland Kitchen levETIRAcetam (KEPPRA) 500 MG tablet Take 1 tablet (500 mg total) by mouth 2 (two)  times daily. 180 tablet 3  . losartan (COZAAR) 50 MG tablet Take 50 mg by mouth at bedtime.     . metFORMIN (GLUCOPHAGE-XR) 500 MG 24 hr tablet Take 500 mg by mouth 2 (two) times daily.    . Multiple Vitamin (MULTIVITAMIN WITH MINERALS) TABS tablet Take 1 tablet by mouth at bedtime.    . Omega-3 Fatty Acids (FISH OIL) 1200 MG CAPS Take 2,400 mg by mouth 2 (two) times daily.    Marland Kitchen omeprazole (PRILOSEC) 20 MG capsule Take 20 mg by mouth at bedtime.     . pioglitazone (ACTOS) 30 MG tablet Take 30 mg by mouth at bedtime.    Vladimir Faster Glycol-Propyl Glycol (SYSTANE) 0.4-0.3 % SOLN Place 1 drop into both eyes 2 (two) times daily.     . risedronate (ACTONEL) 150 MG tablet Take 150 mg by mouth every 30 (thirty) days.    Marland Kitchen sertraline (ZOLOFT) 100 MG tablet Take 100 mg by mouth at bedtime.    . nitroGLYCERIN (NITROSTAT) 0.4 MG SL tablet Place 1 tablet (0.4 mg total) under the tongue every 5 (five) minutes as needed for chest pain. 25 tablet 1   No current facility-administered medications for this visit.     Allergies:   Ace inhibitors, Keflex [cephalexin], Phenobarbital, Sulfate, and Altace [ramipril]    ROS:  Please see the history of present illness.   Otherwise, review of systems are positive for none.   All other systems are reviewed and negative.    PHYSICAL EXAM: VS:  BP 116/70   Pulse 70   Ht 5\' 5"  (1.651 m)   Wt 118 lb (53.5 kg)   BMI 19.64 kg/m  , BMI Body mass index is 19.64 kg/m.  GENERAL:  Well appearing NECK:  No jugular venous distention, waveform within normal limits, carotid upstroke brisk and symmetric, no bruits, no thyromegaly LUNGS:  Clear to auscultation bilaterally CHEST:  Unremarkable HEART:  PMI not displaced or sustained,S1 and S2 within normal limits, no S3, no S4, no clicks, no rubs, no murmurs ABD:  Flat, positive bowel sounds normal in frequency in pitch, no bruits, no rebound, no guarding, no midline pulsatile mass, no hepatomegaly, no splenomegaly EXT:  2 plus  pulses throughout, no edema, no cyanosis no clubbing   EKG:  EKG is   ordered today. Sinus rhythm, rate 70, right bundle branch block, no acute ST-T wave changes.  No change from previous.  QT is prolonged  Recent Labs:    Wt Readings from Last 3 Encounters:  04/27/19 118 lb (53.5 kg)  10/26/18 116 lb 3.2 oz (52.7 kg)  08/25/18 118 lb 4.8 oz (53.7 kg)      Other studies Reviewed: Additional studies/ records that were reviewed today include: None Review of the above records demonstrates:  NA   ASSESSMENT AND PLAN:  CAD:   The patient has no new symptoms.  No change in therapy.   DYSLIPIDEMIA:    LDL was 43.  No change in therapy.  TOBACCO:   She quit smoking completely.  She is off the E cigarettes.  DM:    A1c was 7.2.  This is improved compared to previous.  No change in therapy.  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  I talked to her about that and she would avoid QT prolonging medications and was given some written instructions on this.  Current medicines are reviewed at length with the patient today.  The patient does not have concerns regarding medicines.  The following changes have been made:  None  Labs/ tests ordered today include:  None  No orders of the defined types were placed in this encounter.    Disposition:   FU with me in one year.     Signed, Minus Breeding, MD  04/27/2019 9:50 AM    [place]

## 2019-04-26 NOTE — Telephone Encounter (Signed)
I called Monica [name] to confirm her appt with Dr Percival Spanish on 04-27-19.      1. Confirm consent - "In the setting of the current Covid19 crisis, you are scheduled for a (phone or video) visit with your provider on (date) at (time).  Just as we do with many in-office visits, in order for you to participate in this visit, we must obtain consent.  If you'd like, I can send this to your mychart (if signed up) or email for you to review.  Otherwise, I can obtain your verbal consent now.  All virtual visits are billed to your insurance company just like a normal visit would be.  By agreeing to a virtual visit, we'd like you to understand that the technology does not allow for your provider to perform an examination, and thus may limit your provider's ability to fully assess your condition. If your provider identifies any concerns that need to be evaluated in person, we will make arrangements to do so.  Finally, though the technology is pretty good, we cannot assure that it will always work on either your or our end, and in the setting of a video visit, we may have to convert it to a phone-only visit.  In either situation, we cannot ensure that we have a secure connection.  Are you willing to proceed?" STAFF: Did the patient verbally acknowledge consent to telehealth visit? Document YES/NO here: Yes  FULL LENGTH CONSENT FOR TELE-HEALTH VISIT   I hereby voluntarily request, consent and authorize [facility] and its employed or contracted physicians, physician assistants, nurse practitioners or other licensed health care professionals (the Practitioner), to provide me with telemedicine health care services (the Services") as deemed necessary by the treating Practitioner. I acknowledge and consent to receive the Services by the Practitioner via telemedicine. I understand that the telemedicine visit will involve communicating with the Practitioner through live audiovisual communication technology and the disclosure of  certain medical information by electronic transmission. I acknowledge that I have been given the opportunity to request an in-person assessment or other available alternative prior to the telemedicine visit and am voluntarily participating in the telemedicine visit.  I understand that I have the right to withhold or withdraw my consent to the use of telemedicine in the course of my care at any time, without affecting my right to future care or treatment, and that the Practitioner or I may terminate the telemedicine visit at any time. I understand that I have the right to inspect all information obtained and/or recorded in the course of the telemedicine visit and may receive copies of available information for a reasonable fee.  I understand that some of the potential risks of receiving the Services via telemedicine include:   Delay or interruption in medical evaluation due to technological equipment failure or disruption;  Information transmitted may not be sufficient (e.g. poor resolution of images) to allow for appropriate medical decision making by the Practitioner; and/or   In rare instances, security protocols could fail, causing a breach of personal health information.  Furthermore, I acknowledge that it is my responsibility to provide information about my medical history, conditions and care that is complete and accurate to the best of my ability. I acknowledge that Practitioner's advice, recommendations, and/or decision may be based on factors not within their control, such as incomplete or inaccurate data provided by me or distortions of diagnostic images or specimens that may result from electronic transmissions. I understand that the practice of medicine is not an  exact science and that Practitioner makes no warranties or guarantees regarding treatment outcomes. I acknowledge that I will receive a copy of this consent concurrently upon execution via email to the email address I last provided but  may also request a printed copy by calling the office of [place].    I understand that my insurance will be billed for this visit.   I have read or had this consent read to me.  I understand the contents of this consent, which adequately explains the benefits and risks of the Services being provided via telemedicine.   I have been provided ample opportunity to ask questions regarding this consent and the Services and have had my questions answered to my satisfaction.  I give my informed consent for the services to be provided through the use of telemedicine in my medical care  By participating in this telemedicine visit I agree to the above.

## 2019-04-27 ENCOUNTER — Encounter: Payer: Self-pay | Admitting: Cardiology

## 2019-04-27 ENCOUNTER — Ambulatory Visit (INDEPENDENT_AMBULATORY_CARE_PROVIDER_SITE_OTHER): Payer: Medicare Other | Admitting: Cardiology

## 2019-04-27 ENCOUNTER — Other Ambulatory Visit: Payer: Self-pay

## 2019-04-27 VITALS — BP 116/70 | HR 70 | Ht 65.0 in | Wt 118.0 lb

## 2019-04-27 DIAGNOSIS — E785 Hyperlipidemia, unspecified: Secondary | ICD-10-CM

## 2019-04-27 DIAGNOSIS — E118 Type 2 diabetes mellitus with unspecified complications: Secondary | ICD-10-CM

## 2019-04-27 DIAGNOSIS — I2510 Atherosclerotic heart disease of native coronary artery without angina pectoris: Secondary | ICD-10-CM

## 2019-04-27 NOTE — Patient Instructions (Addendum)
Medication Instructions:  Your physician recommends that you continue on your current medications as directed. Please refer to the Current Medication list given to you today.  If you need a refill on your cardiac medications before your next appointment, please call your pharmacy.   Lab work: NONE   Testing/Procedures: NONE   Follow-Up: . Your physician wants you to follow-up in: [place] will receive a reminder letter in the mail two months in advance. If you don't receive a letter, please call our office to schedule the follow-up appointment.  Any Other Special Instructions Will Be Listed Below (If Applicable).  QT PROLONGED

## 2019-06-01 ENCOUNTER — Telehealth: Payer: Self-pay | Admitting: Nurse Practitioner

## 2019-06-01 NOTE — Telephone Encounter (Signed)
[place] 9/16 moved from [place] to [place]. Confirmed with patient.

## 2019-06-22 NOTE — Progress Notes (Addendum)
[place]   [phone] [phone]   Clinic Follow up Note   Patient Care Team: Jani Gravel, MD as PCP - General (Internal Medicine) [name], MD as Consulting Physician (General Surgery) Truitt Merle, MD as Consulting Physician (Hematology) Kyung Rudd, MD as Consulting Physician (Radiation Oncology) Gardenia Phlegm, NP as Nurse Practitioner (Hematology and Oncology)  Date of Service:  06/27/2019  CHIEF COMPLAINT: Follow up left Breast cancer  SUMMARY OF ONCOLOGIC HISTORY: Oncology History Overview Note  Cancer Staging Malignant neoplasm of upper-inner quadrant of left breast in female, estrogen receptor positive ([place]) Staging form: Breast, AJCC 8th Edition - Clinical stage from 02/16/2017: Stage IA (cT1b, cN0, cM0, G2, ER: Positive, PR: Positive, HER2: Negative) - Unsigned Staging comments: Staged at breast conference  - Pathologic stage from 03/16/2017: Stage IA (pT1b, pN0, cM0, G2, ER: Positive, PR: Positive, HER2: Negative, Oncotype DX score: 27) - Signed by Truitt Merle, MD on 04/24/2017     Malignant neoplasm of upper-inner quadrant of left breast in female, estrogen receptor positive ([place])  02/09/2017 Initial Diagnosis   Malignant neoplasm of upper-inner quadrant of left breast in female, estrogen receptor positive ([place])   02/09/2017 Initial Biopsy   Diagnosis Breast, left, needle core biopsy - INVASIVE DUCTAL CARCINOMA, 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 Dalbert Batman    03/16/2017 Pathology Results   PATHOLOGY REPORT Diagnosis 03/16/17 1. Breast, lumpectomy, Left - INVASIVE DUCTAL CARCINOMA,  [place] GRADE 2 OF 3, 0.9 CM - DUCTAL CARCINOMA IN SITU - MARGINS UNINVOLVED BY CARCINOMA (0.3 CM POSTERIOR MARGIN) - PREVIOUS BIOPSY SITE CHANGES - SEE ONCOLOGY TABLE BELOW 2. Lymph node, sentinel, biopsy, Left axillary #1 - NO CARCINOMA IDENTIFIED IN ONE LYMPH NODE (0/1) 3. Lymph node, sentinel, biopsy, Left axillary #2 - NO CARCINOMA IDENTIFIED IN ONE LYMPH NODE (0/1) 4. Lymph node, sentinel, biopsy, Left axillary #3 - NO CARCINOMA IDENTIFIED IN ONE LYMPH NODE (0/1) 5. Lymph node, sentinel, biopsy, Left axillary #4 - NO CARCINOMA IDENTIFIED IN ONE LYMPH NODE (0/1)    03/16/2017 Oncotype testing   Recurrance score of 27 with a 18% distance recurrance in the net 10 years with Tamoxifen alone    05/19/2017 - 06/16/2017 Radiation Therapy   Radiation with Dr. Lisbeth Renshaw   06/2017 -  Anti-estrogen oral therapy   anastrozole 1 mg once a day starting late 06/2017        CURRENT THERAPY:  anastrozole 1 mg once a day starting 06/2017   INTERVAL HISTORY:  Monica [name] is here for a follow up of left breast cancer. She presents to the clinic alone. She notes she is doing well. She has not issues with anastrozole. She has stable joint pain. She notes her BG has been in 130-150 range. She will have DEXA soon. She denies any questions or concerns. She notes she is still cutting back on smoking.    REVIEW OF SYSTEMS:   Constitutional: Denies fevers, chills or abnormal weight loss Eyes: Denies blurriness of vision Ears, nose, mouth, throat, and face: Denies mucositis or sore throat Respiratory: Denies cough, dyspnea or wheezes Cardiovascular: Denies palpitation, chest discomfort or lower extremity swelling Gastrointestinal:  Denies nausea, heartburn or change in bowel  habits Skin: Denies abnormal skin rashes Lymphatics: Denies new lymphadenopathy or easy bruising Neurological:Denies numbness, tingling or new weaknesses Behavioral/Psych: Mood is stable, no new changes  All other systems were  reviewed with the patient and are negative.  MEDICAL HISTORY:  Past Medical History:  Diagnosis Date  . CAD (coronary artery disease)    Cypher stent 09/2002  . Cancer [facility]) 2018   left breast, radiation therapy  . CHF (congestive heart failure) ([place])   . Depression   . Diabetes mellitus ([place])   . Hyperlipidemia   . Hypertension   . Myocardial infarction ([place])    2003,went into cardiac shock  . Osteoporosis   . Seizures ([place])    1st seizure age late 24's; most recent 07/28/17  . Sinusitis     SURGICAL HISTORY: Past Surgical History:  Procedure Laterality Date  . ABDOMINAL HYSTERECTOMY    . APPENDECTOMY    . BREAST LUMPECTOMY WITH RADIOACTIVE SEED AND SENTINEL LYMPH NODE BIOPSY Left 03/16/2017   Procedure: INJECT BLUE DYE LEFT BREAST, LEFT BREAST LUMPECTOMY WITH RADIOACTIVE SEED AND LEFT AXILLARY DEEP SENTINEL LYMPH NODE  BIOPSY ADJACENT TISSUE TRANSFER;  Surgeon: [name], MD;  Location: [place];  Service: General;  Laterality: Left;  . CARPAL TUNNEL RELEASE Bilateral   . COLONOSCOPY WITH PROPOFOL N/A 05/30/2015   Procedure: COLONOSCOPY WITH PROPOFOL;  Surgeon: Carol Ada, MD;  Location: [facility];  Service: Endoscopy;  Laterality: N/A;  . COLONOSCOPY WITH PROPOFOL N/A 06/09/2018   Procedure: COLONOSCOPY WITH PROPOFOL;  Surgeon: Carol Ada, MD;  Location: [facility];  Service: Endoscopy;  Laterality: N/A;  . CORONARY ANGIOPLASTY WITH STENT PLACEMENT  10-02-2002  . Exploratory abdominal     Bleeding in abdomen after tubal  . POLYPECTOMY  06/09/2018   Procedure: POLYPECTOMY;  Surgeon: Carol Ada, MD;  Location: [facility];  Service: Endoscopy;;  . TUBAL LIGATION    . UMBILICAL HERNIA REPAIR      I have reviewed the social history and family history with the patient and they are unchanged from previous note.  ALLERGIES:  is allergic to ace inhibitors; keflex [cephalexin]; phenobarbital; sulfate; and altace [ramipril].  MEDICATIONS:  Current Outpatient  Medications  Medication Sig Dispense Refill  . acetaminophen (TYLENOL) 500 MG tablet Take 1,000 mg by mouth every 6 (six) hours as needed for moderate pain or headache.    . anastrozole (ARIMIDEX) 1 MG tablet Take 1 tablet (1 mg total) by mouth daily. 90 tablet 1  . aspirin EC 81 MG tablet Take 81 mg by mouth at bedtime.    Marland Kitchen atorvastatin (LIPITOR) 40 MG tablet Take 40 mg by mouth at bedtime.    . Calcium Carbonate-Vitamin D 600-400 MG-UNIT per tablet Take 1 tablet by mouth daily.     . carvedilol (COREG) 3.125 MG tablet Take 3.125 mg by mouth 2 (two) times daily.     . clonazePAM (KLONOPIN) 1 MG tablet Take 1 mg by mouth at bedtime.     Marland Kitchen glimepiride (AMARYL) 4 MG tablet Take 4 mg by mouth 2 (two) times daily.    . halobetasol (ULTRAVATE) 0.05 % cream Apply 1 application topically 2 (two) times daily as needed (for rash).     . Insulin Detemir (LEVEMIR) 100 UNIT/ML Pen Inject 17-25 Units into the skin See admin instructions. Inject 25 units SQ in the morning and inject 17 units SQ at night    . insulin lispro (HUMALOG) 100 UNIT/ML injection Inject 5 Units into the skin at bedtime.    Marland Kitchen  JANUVIA 100 MG tablet Take 100 mg by mouth at bedtime.     Marland Kitchen JARDIANCE 10 MG TABS tablet Take 10 mg by mouth daily.    Marland Kitchen levETIRAcetam (KEPPRA) 500 MG tablet Take 1 tablet (500 mg total) by mouth 2 (two) times daily. 180 tablet 3  . losartan (COZAAR) 50 MG tablet Take 50 mg by mouth at bedtime.     . metFORMIN (GLUCOPHAGE-XR) 500 MG 24 hr tablet Take 500 mg by mouth 2 (two) times daily.    . Multiple Vitamin (MULTIVITAMIN WITH MINERALS) TABS tablet Take 1 tablet by mouth at bedtime.    . nitroGLYCERIN (NITROSTAT) 0.4 MG SL tablet Place 1 tablet (0.4 mg total) under the tongue every 5 (five) minutes as needed for chest pain. 25 tablet 1  . Omega-3 Fatty Acids (FISH OIL) 1200 MG CAPS Take 2,400 mg by mouth 2 (two) times daily.    Marland Kitchen omeprazole (PRILOSEC) 20 MG capsule Take 20 mg by mouth at bedtime.     .  pioglitazone (ACTOS) 30 MG tablet Take 30 mg by mouth at bedtime.    Vladimir Faster Glycol-Propyl Glycol (SYSTANE) 0.4-0.3 % SOLN Place 1 drop into both eyes 2 (two) times daily.     . risedronate (ACTONEL) 150 MG tablet Take 150 mg by mouth every 30 (thirty) days.    Marland Kitchen sertraline (ZOLOFT) 100 MG tablet Take 100 mg by mouth at bedtime.     No current facility-administered medications for this visit.     PHYSICAL EXAMINATION: ECOG PERFORMANCE STATUS: 1 - Symptomatic but completely ambulatory  Vitals:   06/27/19 1151  BP: 116/72  Pulse: 65  Resp: 18  Temp: 98.3 F (36.8 C)  SpO2: 96%   Filed Weights   06/27/19 1151  Weight: 119 lb 9.6 oz (54.3 kg)    GENERAL:alert, no distress and comfortable SKIN: skin color, texture, turgor are normal, no rashes or significant lesions EYES: normal, Conjunctiva are pink and non-injected, sclera clear  NECK: supple, thyroid normal size, non-tender, without nodularity LYMPH:  no palpable lymphadenopathy in the cervical, axillary  LUNGS: clear to auscultation and percussion with normal breathing effort HEART: regular rate & rhythm and no murmurs and no lower extremity edema ABDOMEN:abdomen soft, non-tender and normal bowel sounds Musculoskeletal:no cyanosis of digits and no clubbing  NEURO: alert & oriented x 3 with fluent speech, no focal motor/sensory deficits BREAST: S/p left lumpectomy: Surgical incision healed well with mild scar tissue. No palpable mass, nodules or adenopathy bilaterally. Breast exam benign.   LABORATORY DATA:  I have reviewed the data as listed CBC Latest Ref Rng & Units 06/27/2019 08/25/2018 05/25/2018  WBC 4.0 - 10.5 K/uL 6.1 5.9 5.4  Hemoglobin 12.0 - 15.0 g/dL 16.3(H) 16.3(H) 15.6  Hematocrit 36.0 - 46.0 % 50.8(H) 51.6(H) 47.4(H)  Platelets 150 - 400 K/uL 105(L) 110(L) 97(L)     CMP Latest Ref Rng & Units 06/27/2019 08/25/2018 05/25/2018  Glucose 70 - 99 mg/dL 174(H) 135(H) 160(H)  BUN 8 - 23 mg/dL '12 10 10   '$ Creatinine 0.44 - 1.00 mg/dL 0.71 0.73 0.70  Sodium 135 - 145 mmol/L 139 140 140  Potassium 3.5 - 5.1 mmol/L 4.0 4.2 4.2  Chloride 98 - 111 mmol/L 102 103 102  CO2 22 - 32 mmol/L '29 28 29  '$ Calcium 8.9 - 10.3 mg/dL 9.8 10.0 10.5(H)  Total Protein 6.5 - 8.1 g/dL 7.1 7.6 7.3  Total Bilirubin 0.3 - 1.2 mg/dL 0.5 0.5 0.6  Alkaline Phos 38 - 126  U/L 58 60 69  AST 15 - 41 U/L '26 25 23  '$ ALT 0 - 44 U/L '25 24 22      '$ RADIOGRAPHIC STUDIES: I have personally reviewed the radiological images as listed and agreed with the findings in the report. No results found.   ASSESSMENT & PLAN:  TEIRRA CARAPIA is a 73 y.o. female with   1. 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 She is s/p left breast lumpectomy and adjuvant radiation.  -the Oncotype Dx result was reviewed with her in details. She had intermediate risk based on the recurrence score 27, which predicts 10 year distant recurrence after 5 years of tamoxifen 18%. Given her advanced age, comorbidities, strong ER and PR expression in her tumor, we decided not to pursue adjuvant chemotherapy. -She started antiestrogen therapy with Anastrozole in 06/2017. She has tolerated well with no issues so far. -She is clinically doing well. Lab reviewed, her CBC and CMP are within normal limits. Her physical exam and her 02/2019 mammogram were unremarkable. There is no clinical concern for recurrence. -Continue surveillance. Next mammogram in 02/2020 -Continue anastrozole  -F/u in 6 months  -I encouraged her to have flu shot with her PCP who she will see next week.   2. DM -Much better controlled lately. She is on insulin and multiple oral diabetic medications -Follows up with Dr. Maudie Mercury her PCP -CBC in 11/2018 shows PLT stable   3. 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  -No history of liver disease or alcohol abuse. 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 -PLT at 105k today (06/27/19). Will continue to monitor.    4.  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 Osteopenia  -Continue calcium and vitamin D -Based on her DEXA from 06/30/17 she is osteopenic with a left femur neck T-Score of -2.4  -We discussed the potential negative impact on her bone density from anastrozole. We will follow-up her bone density scan every 2 years -she has started risedronate by her PCP, tolerating well, will continue.  -Next DEXA in 06/2019, will proceed soon with PCP.   6. Depression and mood swing -She has become slightly more irritable since she started anastrozole, much improved now  - she is on Zoloft 100 mg daily, doing well.   7. Smoking and lung nodule, Emphysema -She has heavy smoking history, has cut cigarettes to half pack a day -She underwent low-dose CT scan in 02/2018 for lung cancer screening, which showed several small pulmonary nodules, largest in the right upper lobe 7 mm, likely benign, will follow-up. -Screening CT Chest from 08/25/18 shows no evidence of malignancy but does show significant emphysema in her lungs.I reviewed the images myself and discussed with pt. -She continues trying to cut back on smoking. I encouraged her to continue until she quits.   8. Cancer screening  -05/2018 Colonoscopy with Dr. Benson Norway shows her to have Diverticulosis with multiple benign polyps for which she is at high risk for colon cancer. She will continue to follow up with him and repeatcolonoscopyin 3 years.   PLAN: -She is clinically doing well.  -Continue anastrozole  -Lab and f/u in 6 months  No problem-specific Assessment & Plan notes found for this encounter.   No  orders of the defined types were placed in this encounter.  All questions were answered. The patient knows to call the clinic with any problems, questions or concerns. No barriers to learning was detected. I spent 15 minutes counseling the patient face to face. The total time spent in the appointment was 20 minutes and more than 50% was on counseling and review of test results     Truitt Merle, MD 06/27/2019   I, Joslyn Devon, am acting as scribe for Truitt Merle, MD.   I have reviewed the above documentation for accuracy and completeness, and I agree with the above.

## 2019-06-27 ENCOUNTER — Inpatient Hospital Stay: Payer: Medicare Other | Attending: Nurse Practitioner | Admitting: Hematology

## 2019-06-27 ENCOUNTER — Encounter: Payer: Self-pay | Admitting: Hematology

## 2019-06-27 ENCOUNTER — Ambulatory Visit: Payer: Medicare Other | Admitting: Hematology

## 2019-06-27 ENCOUNTER — Other Ambulatory Visit: Payer: Self-pay

## 2019-06-27 ENCOUNTER — Inpatient Hospital Stay: Payer: Medicare Other

## 2019-06-27 ENCOUNTER — Other Ambulatory Visit: Payer: Medicare Other

## 2019-06-27 VITALS — BP 116/72 | HR 65 | Temp 98.3°F | Resp 18 | Ht 65.0 in | Wt 119.6 lb

## 2019-06-27 DIAGNOSIS — Z170 Estrogen receptor positive status [ER+]: Secondary | ICD-10-CM | POA: Diagnosis not present

## 2019-06-27 DIAGNOSIS — M8580 Other specified disorders of bone density and structure, unspecified site: Secondary | ICD-10-CM | POA: Insufficient documentation

## 2019-06-27 DIAGNOSIS — C50212 Malignant neoplasm of upper-inner quadrant of left female breast: Secondary | ICD-10-CM | POA: Diagnosis not present

## 2019-06-27 DIAGNOSIS — C50412 Malignant neoplasm of upper-outer quadrant of left female breast: Secondary | ICD-10-CM | POA: Insufficient documentation

## 2019-06-27 DIAGNOSIS — E119 Type 2 diabetes mellitus without complications: Secondary | ICD-10-CM | POA: Insufficient documentation

## 2019-06-27 DIAGNOSIS — F329 Major depressive disorder, single episode, unspecified: Secondary | ICD-10-CM | POA: Insufficient documentation

## 2019-06-27 DIAGNOSIS — J439 Emphysema, unspecified: Secondary | ICD-10-CM | POA: Diagnosis not present

## 2019-06-27 DIAGNOSIS — Z79811 Long term (current) use of aromatase inhibitors: Secondary | ICD-10-CM | POA: Insufficient documentation

## 2019-06-27 DIAGNOSIS — D696 Thrombocytopenia, unspecified: Secondary | ICD-10-CM | POA: Diagnosis not present

## 2019-06-27 DIAGNOSIS — D751 Secondary polycythemia: Secondary | ICD-10-CM | POA: Insufficient documentation

## 2019-06-27 DIAGNOSIS — I2510 Atherosclerotic heart disease of native coronary artery without angina pectoris: Secondary | ICD-10-CM | POA: Diagnosis not present

## 2019-06-27 LAB — CBC WITH DIFFERENTIAL/PLATELET
Abs Immature Granulocytes: 0.02 10*3/uL (ref 0.00–0.07)
Basophils Absolute: 0 10*3/uL (ref 0.0–0.1)
Basophils Relative: 0 %
Eosinophils Absolute: 0.1 10*3/uL (ref 0.0–0.5)
Eosinophils Relative: 2 %
HCT: 50.8 % — ABNORMAL HIGH (ref 36.0–46.0)
Hemoglobin: 16.3 g/dL — ABNORMAL HIGH (ref 12.0–15.0)
Immature Granulocytes: 0 %
Lymphocytes Relative: 26 %
Lymphs Abs: 1.6 10*3/uL (ref 0.7–4.0)
MCH: 25.4 pg — ABNORMAL LOW (ref 26.0–34.0)
MCHC: 32.1 g/dL (ref 30.0–36.0)
MCV: 79.3 fL — ABNORMAL LOW (ref 80.0–100.0)
Monocytes Absolute: 0.3 10*3/uL (ref 0.1–1.0)
Monocytes Relative: 5 %
Neutro Abs: 4 10*3/uL (ref 1.7–7.7)
Neutrophils Relative %: 67 %
Platelets: 105 10*3/uL — ABNORMAL LOW (ref 150–400)
RBC: 6.41 MIL/uL — ABNORMAL HIGH (ref 3.87–5.11)
RDW: 16.6 % — ABNORMAL HIGH (ref 11.5–15.5)
WBC: 6.1 10*3/uL (ref 4.0–10.5)
nRBC: 0 % (ref 0.0–0.2)

## 2019-06-27 LAB — RETICULOCYTES
Immature Retic Fract: 18.6 % — ABNORMAL HIGH (ref 2.3–15.9)
RBC.: 6.41 MIL/uL — ABNORMAL HIGH (ref 3.87–5.11)
Retic Count, Absolute: 82.7 10*3/uL (ref 19.0–186.0)
Retic Ct Pct: 1.3 % (ref 0.4–3.1)

## 2019-06-27 LAB — COMPREHENSIVE METABOLIC PANEL
ALT: 25 U/L (ref 0–44)
AST: 26 U/L (ref 15–41)
Albumin: 4.2 g/dL (ref 3.5–5.0)
Alkaline Phosphatase: 58 U/L (ref 38–126)
Anion gap: 8 (ref 5–15)
BUN: 12 mg/dL (ref 8–23)
CO2: 29 mmol/L (ref 22–32)
Calcium: 9.8 mg/dL (ref 8.9–10.3)
Chloride: 102 mmol/L (ref 98–111)
Creatinine, Ser: 0.71 mg/dL (ref 0.44–1.00)
GFR calc Af Amer: >60 mL/min (ref >60)
GFR calc non Af Amer: >60 mL/min (ref >60)
Glucose, Bld: 174 mg/dL — ABNORMAL HIGH (ref 70–99)
Potassium: 4 mmol/L (ref 3.5–5.1)
Sodium: 139 mmol/L (ref 135–145)
Total Bilirubin: 0.5 mg/dL (ref 0.3–1.2)
Total Protein: 7.1 g/dL (ref 6.5–8.1)

## 2019-06-28 ENCOUNTER — Telehealth: Payer: Self-pay | Admitting: Hematology

## 2019-06-28 NOTE — Telephone Encounter (Signed)
Scheduled appt per 9/16 los.  A calendar will be mailed out.

## 2019-06-30 ENCOUNTER — Other Ambulatory Visit: Payer: Self-pay | Admitting: Hematology

## 2019-06-30 DIAGNOSIS — D751 Secondary polycythemia: Secondary | ICD-10-CM

## 2019-10-28 NOTE — Progress Notes (Signed)
PATIENT: Monica [name] DOB: [birth year]  REASON FOR VISIT: follow up HISTORY FROM: patient  Chief Complaint  Patient presents with  . Follow-up    Yearly f/u. Alone. Rm 1. No new concerns at this time.      HISTORY OF PRESENT ILLNESS: Today 10/29/19 Monica [name] is a 74 y.o. female here today for follow up for seizure. She continues levetiracetam 500mg  twice daily. Last seizure 07/2017. She denies missed doses of medication or obvious adverse effects.  She is able to perform ADLs independently.  She lives at home with her husband.  She reports that she is eating and sleeping well.  She does drive but only during the daytime and to local places.  She is doing well today and without concerns.  HISTORY: (copied from [place] [facility] note on 10/26/2018)  UPDATE 1/16/2020CM Monica [name], 74 year old female returns for follow-up with seizure disorder with last seizure occurring in October 2018.  She is currently on Keppra 500 mg twice daily.  She denies any side effects to the medication.  She denies any interval medical issues.  She returns for reevaluation and refills  UPDATE 7/2/2019CM Monica [name], 74 year old female returns for follow-up for seizure disorder.  Last seizure occurred 07/28/2017.  She is currently taking levetiracetam 500 mg twice daily without side effects.  She returns for reevaluation  UPDATE (10/05/17, VRP): Since last visit, doingwell. ToleratingLEV 500mg twice a day.No alleviating or aggravating factors.  PRIOR HPI (08/02/17):74 year old female here for evaluation of seizures. History of diabetes, heart disease, breast cancer. At age late 74 years old patient had a seizure, lips turn blue, no tongue biting or incontinence. She was apparently treated with antiseizure medication for several years and then discontinued. May 24, 2017 patient was at [facility], when all of a sudden she collapsed and had a seizure. Blood glucose was checked within 15  minutes and was 52. No warning symptoms. She ate breakfast that morning but no lunch. She was taken to the hospital and evaluated. July 28, 2017 patient was at home, when all of a sudden she collapsed. She was found stiff all over, with some convulsions. Family gave her some juice to drink and blood sugar after testing was 120. She was confused afterwards. No tongue biting or incontinence. Patient has diagnosis of breast cancer. Her first radiation treatment was May 11, 2017.   REVIEW OF SYSTEMS: Out of a complete 14 system review of symptoms, the patient complains only of the following symptoms, none and all other reviewed systems are negative.  ALLERGIES: Allergies  Allergen Reactions  . Ace Inhibitors Dermatitis and Cough  . Keflex [Cephalexin] Itching  . Phenobarbital Other (See Comments)    Black spots on hands and feet.  . Sulfate Nausea And Vomiting and Other (See Comments)    Dizziness   . Altace [Ramipril] Other (See Comments)    UNSPECIFIED REACTION      HOME MEDICATIONS: Outpatient Medications Prior to Visit  Medication Sig Dispense Refill  . acetaminophen (TYLENOL) 500 MG tablet Take 1,000 mg by mouth every 6 (six) hours as needed for moderate pain or headache.    . anastrozole (ARIMIDEX) 1 MG tablet Take 1 tablet (1 mg total) by mouth daily. 90 tablet 1  . aspirin EC 81 MG tablet Take 81 mg by mouth at bedtime.    Marland Kitchen atorvastatin (LIPITOR) 40 MG tablet Take 40 mg by mouth at bedtime.    . Calcium Carbonate-Vitamin D 600-400 MG-UNIT per tablet Take 1 tablet  by mouth daily.     . carvedilol (COREG) 3.125 MG tablet Take 3.125 mg by mouth 2 (two) times daily.     . clonazePAM (KLONOPIN) 1 MG tablet Take 1 mg by mouth at bedtime.     Marland Kitchen glimepiride (AMARYL) 4 MG tablet Take 4 mg by mouth 2 (two) times daily.    . halobetasol (ULTRAVATE) 0.05 % cream Apply 1 application topically 2 (two) times daily as needed (for rash).     . Insulin Detemir (LEVEMIR) 100 UNIT/ML Pen  Inject 17-25 Units into the skin See admin instructions. Inject 25 units SQ in the morning and inject 17 units SQ at night    . insulin lispro (HUMALOG) 100 UNIT/ML injection Inject 5 Units into the skin at bedtime.    Marland Kitchen JANUVIA 100 MG tablet Take 100 mg by mouth at bedtime.     Marland Kitchen JARDIANCE 10 MG TABS tablet Take 10 mg by mouth daily.    Marland Kitchen losartan (COZAAR) 50 MG tablet Take 50 mg by mouth at bedtime.     . metFORMIN (GLUCOPHAGE-XR) 500 MG 24 hr tablet Take 500 mg by mouth 2 (two) times daily.    . Multiple Vitamin (MULTIVITAMIN WITH MINERALS) TABS tablet Take 1 tablet by mouth at bedtime.    . Omega-3 Fatty Acids (FISH OIL) 1200 MG CAPS Take 2,400 mg by mouth 2 (two) times daily.    Marland Kitchen omeprazole (PRILOSEC) 20 MG capsule Take 20 mg by mouth at bedtime.     . pioglitazone (ACTOS) 30 MG tablet Take 30 mg by mouth at bedtime.    Vladimir Faster Glycol-Propyl Glycol (SYSTANE) 0.4-0.3 % SOLN Place 1 drop into both eyes 2 (two) times daily.     . risedronate (ACTONEL) 150 MG tablet Take 150 mg by mouth every 30 (thirty) days.    Marland Kitchen sertraline (ZOLOFT) 100 MG tablet Take 100 mg by mouth at bedtime.    . levETIRAcetam (KEPPRA) 500 MG tablet Take 1 tablet (500 mg total) by mouth 2 (two) times daily. 180 tablet 3  . nitroGLYCERIN (NITROSTAT) 0.4 MG SL tablet Place 1 tablet (0.4 mg total) under the tongue every 5 (five) minutes as needed for chest pain. 25 tablet 1   No facility-administered medications prior to visit.    PAST MEDICAL HISTORY: Past Medical History:  Diagnosis Date  . CAD (coronary artery disease)    Cypher stent 09/2002  . Cancer [facility]) 2018   left breast, radiation therapy  . CHF (congestive heart failure) ([place])   . Depression   . Diabetes mellitus ([place])   . Hyperlipidemia   . Hypertension   . Myocardial infarction ([place])    2003,went into cardiac shock  . Osteoporosis   . Seizures ([place])    1st seizure age late 7's; most recent 07/28/17  . Sinusitis     PAST SURGICAL  HISTORY: Past Surgical History:  Procedure Laterality Date  . ABDOMINAL HYSTERECTOMY    . APPENDECTOMY    . BREAST LUMPECTOMY WITH RADIOACTIVE SEED AND SENTINEL LYMPH NODE BIOPSY Left 03/16/2017   Procedure: INJECT BLUE DYE LEFT BREAST, LEFT BREAST LUMPECTOMY WITH RADIOACTIVE SEED AND LEFT AXILLARY DEEP SENTINEL LYMPH NODE  BIOPSY ADJACENT TISSUE TRANSFER;  Surgeon: [name], MD;  Location: [place];  Service: General;  Laterality: Left;  . CARPAL TUNNEL RELEASE Bilateral   . COLONOSCOPY WITH PROPOFOL N/A 05/30/2015   Procedure: COLONOSCOPY WITH PROPOFOL;  Surgeon: Carol Ada, MD;  Location: [facility];  Service: Endoscopy;  Laterality: N/A;  .  COLONOSCOPY WITH PROPOFOL N/A 06/09/2018   Procedure: COLONOSCOPY WITH PROPOFOL;  Surgeon: Carol Ada, MD;  Location: [facility];  Service: Endoscopy;  Laterality: N/A;  . CORONARY ANGIOPLASTY WITH STENT PLACEMENT  10-02-2002  . Exploratory abdominal     Bleeding in abdomen after tubal  . POLYPECTOMY  06/09/2018   Procedure: POLYPECTOMY;  Surgeon: Carol Ada, MD;  Location: [facility];  Service: Endoscopy;;  . TUBAL LIGATION    . UMBILICAL HERNIA REPAIR      FAMILY HISTORY: Family History  Problem Relation Age of Onset  . Dementia Mother   . Kidney disease Mother   . Heart attack Father 27       Died age 9  . Heart attack Brother 36  . Heart attack Brother 26    SOCIAL HISTORY: Social History   Socioeconomic History  . Marital status: Married    Spouse name: Sonia Side  . Number of children: 3  . Years of education: 28  . Highest education level: Not on file  Occupational History  . Not on file  Tobacco Use  . Smoking status: Former Smoker    Packs/day: 2.00    Years: 30.00    Pack years: 60.00    Types: Cigarettes    Quit date: 03/09/2017    Years since quitting: 2.6  . Smokeless tobacco: Never Used  . Tobacco comment: quit  Substance and Sexual Activity  . Alcohol use: No    Alcohol/week: 0.0 standard drinks  .  Drug use: No  . Sexual activity: Not on file  Other Topics Concern  . Not on file  Social History Narrative   Lives with husband.    Caffeine-    Social Determinants of Health   Financial Resource Strain:   . Difficulty of Paying Living Expenses: Not on file  Food Insecurity:   . Worried About Charity fundraiser in the Last Year: Not on file  . Ran Out of Food in the Last Year: Not on file  Transportation Needs:   . Lack of Transportation (Medical): Not on file  . Lack of Transportation (Non-Medical): Not on file  Physical Activity:   . Days of Exercise per Week: Not on file  . Minutes of Exercise per Session: Not on file  Stress:   . Feeling of Stress : Not on file  Social Connections:   . Frequency of Communication with Friends and Family: Not on file  . Frequency of Social Gatherings with Friends and Family: Not on file  . Attends Religious Services: Not on file  . Active Member of Clubs or Organizations: Not on file  . Attends Archivist Meetings: Not on file  . Marital Status: Not on file  Intimate Partner Violence:   . Fear of Current or Ex-Partner: Not on file  . Emotionally Abused: Not on file  . Physically Abused: Not on file  . Sexually Abused: Not on file      PHYSICAL EXAM  Vitals:   10/29/19 1112  BP: 106/62  Pulse: 64  Temp: (!) 95.9 F (35.5 C)  TempSrc: Oral  Weight: 121 lb 9.6 oz (55.2 kg)  Height: 5\' 5"  (1.651 m)   Body mass index is 20.24 kg/m.  Generalized: Well developed, in no acute distress  Cardiology: normal rate and rhythm, no murmur noted Respiratory: Clear to auscultation bilaterally  Neurological examination  Mentation: Alert oriented to time, place, history taking. Follows all commands speech and language fluent Cranial nerve II-XII: Pupils were  equal round reactive to light. Extraocular movements were full, visual field were full on confrontational test. Facial sensation and strength were normal. Uvula tongue  midline. Head turning and shoulder shrug  were normal and symmetric. Motor: The motor testing reveals 4 over 5 strength of all 4 extremities. Good symmetric motor tone is noted throughout.  Sensory: Sensory testing is intact to soft touch on all 4 extremities. No evidence of extinction is noted.  Coordination: Cerebellar testing reveals good finger-nose-finger and heel-to-shin bilaterally.  Gait and station: Gait is normal.   DIAGNOSTIC DATA (LABS, IMAGING, TESTING) - I reviewed patient records, labs, notes, testing and imaging myself where available.  No flowsheet data found.   Lab Results  Component Value Date   WBC 6.1 06/27/2019   HGB 16.3 (H) 06/27/2019   HCT 50.8 (H) 06/27/2019   MCV 79.3 (L) 06/27/2019   PLT 105 (L) 06/27/2019      Component Value Date/Time   NA 139 06/27/2019 1136   NA 139 08/25/2017 1224   K 4.0 06/27/2019 1136   K 4.3 08/25/2017 1224   CL 102 06/27/2019 1136   CO2 29 06/27/2019 1136   CO2 26 08/25/2017 1224   GLUCOSE 174 (H) 06/27/2019 1136   GLUCOSE 149 (H) 08/25/2017 1224   BUN 12 06/27/2019 1136   BUN 10.1 08/25/2017 1224   CREATININE 0.71 06/27/2019 1136   CREATININE 0.7 08/25/2017 1224   CALCIUM 9.8 06/27/2019 1136   CALCIUM 9.4 08/25/2017 1224   PROT 7.1 06/27/2019 1136   PROT 7.2 08/25/2017 1224   ALBUMIN 4.2 06/27/2019 1136   ALBUMIN 4.0 08/25/2017 1224   AST 26 06/27/2019 1136   AST 24 08/25/2017 1224   ALT 25 06/27/2019 1136   ALT 33 08/25/2017 1224   ALKPHOS 58 06/27/2019 1136   ALKPHOS 72 08/25/2017 1224   BILITOT 0.5 06/27/2019 1136   BILITOT 0.49 08/25/2017 1224   GFRNONAA >60 06/27/2019 1136   GFRAA >60 06/27/2019 1136   No results found for: CHOL, HDL, LDLCALC, LDLDIRECT, TRIG, CHOLHDL Lab Results  Component Value Date   HGBA1C 8.5 (H) 03/09/2017   No results found for: VITAMINB12 No results found for: TSH     ASSESSMENT AND PLAN 74 y.o. year old female  has a past medical history of CAD (coronary artery  disease), Cancer ([place]) (2018), CHF (congestive heart failure) ([place]), Depression, Diabetes mellitus ([place]), Hyperlipidemia, Hypertension, Myocardial infarction ([place]), Osteoporosis, Seizures ([place]), and Sinusitis. here with     ICD-10-CM   1. Seizure disorder ([place])  G40.909     Tacy is doing well on levetiracetam 500mg  twice daily.  No recent seizure activity.  She is tolerating medication well.  Primary care follows regularly and updates labs biannually.  Labs from September 2020 reviewed for today's visit.  We will continue current treatment plan.  She was advised to stay well-hydrated and work on a healthy well-balanced diet and regular exercise.  Seizure precautions reviewed.  She will follow-up with me in 1 year, sooner if needed.  She verbalizes understanding and agreement with this plan.   No orders of the defined types were placed in this encounter.    Meds ordered this encounter  Medications  . levETIRAcetam (KEPPRA) 500 MG tablet    Sig: Take 1 tablet (500 mg total) by mouth 2 (two) times daily.    Dispense:  180 tablet    Refill:  3    Order Specific Question:   Supervising Provider    Answer:  AHERN, ANTONIA B [[id number]]      I spent 15 minutes with the patient. 50% of this time was spent counseling and educating patient on plan of care and medications.    Debbora Presto, FNP-C 10/29/2019, 11:47 AM [facility] [address] [place], [place] [address] [phone]

## 2019-10-29 ENCOUNTER — Encounter: Payer: Self-pay | Admitting: Family Medicine

## 2019-10-29 ENCOUNTER — Ambulatory Visit: Payer: Medicare Other | Admitting: Family Medicine

## 2019-10-29 ENCOUNTER — Other Ambulatory Visit: Payer: Self-pay

## 2019-10-29 VITALS — BP 106/62 | HR 64 | Temp 95.9°F | Ht 65.0 in | Wt 121.6 lb

## 2019-10-29 DIAGNOSIS — G40909 Epilepsy, unspecified, not intractable, without status epilepticus: Secondary | ICD-10-CM

## 2019-10-29 MED ORDER — LEVETIRACETAM 500 MG PO TABS: 500.0000 mg | ORAL_TABLET | Freq: Two times a day (BID) | ORAL | 3 refills | Status: DC

## 2019-10-29 NOTE — Patient Instructions (Signed)
We will continue levetiracetam 500mg  twice daily.   Adequate hydration and healthy lifestyle habits advised   Follow up in 1 year   Seizure, Adult A seizure is a sudden burst of abnormal electrical activity in the brain. Seizures usually last from 30 seconds to 2 minutes. They can cause many different symptoms. Usually, seizures are not harmful unless they last a long time. What are the causes? Common causes of this condition include:  Fever or infection.  Conditions that affect the brain, such as: ? A brain abnormality that you were born with. ? A brain or head injury. ? Bleeding in the brain. ? A tumor. ? Stroke. ? Brain disorders such as autism or cerebral palsy.  Low blood sugar.  Conditions that are passed from parent to child (are inherited).  Problems with substances, such as: ? Having a reaction to a drug or a medicine. ? Suddenly stopping the use of a substance (withdrawal). In some cases, the cause may not be known. A person who has repeated seizures over time without a clear cause has a condition called epilepsy. What increases the risk? You are more likely to get this condition if you have:  A family history of epilepsy.  Had a seizure in the past.  A brain disorder.  A history of head injury, lack of oxygen at birth, or strokes. What are the signs or symptoms? There are many types of seizures. The symptoms vary depending on the type of seizure you have. Examples of symptoms during a seizure include:  Shaking (convulsions).  Stiffness in the body.  Passing out (losing consciousness).  Head nodding.  Staring.  Not responding to sound or touch.  Loss of bladder control and bowel control. Some people have symptoms right before and right after a seizure happens. Symptoms before a seizure may include:  Fear.  Worry (anxiety).  Feeling like you may vomit (nauseous).  Feeling like the room is spinning (vertigo).  Feeling like you saw or heard  something before (dj vu).  Odd tastes or smells.  Changes in how you see. You may see flashing lights or spots. Symptoms after a seizure happens can include:  Confusion.  Sleepiness.  Headache.  Weakness on one side of the body. How is this treated? Most seizures will stop on their own in under 5 minutes. In these cases, no treatment is needed. Seizures that last longer than 5 minutes will usually need treatment. Treatment can include:  Medicines given through an IV tube.  Avoiding things that are known to cause your seizures. These can include medicines that you take for another condition.  Medicines to treat epilepsy.  Surgery to stop the seizures. This may be needed if medicines do not help. Follow these instructions at home: Medicines  Take over-the-counter and prescription medicines only as told by your doctor.  Do not eat or drink anything that may keep your medicine from working, such as alcohol. Activity  Do not do any activities that would be dangerous if you had another seizure, like driving or swimming. Wait until your doctor says it is safe for you to do them.  If you live in the [place] ask your local DMV (department of motor vehicles) when you can drive.  Get plenty of rest. Teaching others Teach friends and family what to do when you have a seizure. They should:  Lay you on the ground.  Protect your head and body.  Loosen any tight clothing around your neck.  Turn you on  your side.  Not hold you down.  Not put anything into your mouth.  Know whether or not you need emergency care.  Stay with you until you are better.  General instructions  Contact your doctor each time you have a seizure.  Avoid anything that gives you seizures.  Keep a seizure diary. Write down: ? What you think caused each seizure. ? What you remember about each seizure.  Keep all follow-up visits as told by your doctor. This is important. Contact a doctor if:  You  have another seizure.  You have seizures more often.  There is any change in what happens during your seizures.  You keep having seizures with treatment.  You have symptoms of being sick or having an infection. Get help right away if:  You have a seizure that: ? Lasts longer than 5 minutes. ? Is different than seizures you had before. ? Makes it harder to breathe. ? Happens after you hurt your head.  You have any of these symptoms after a seizure: ? Not being able to speak. ? Not being able to use a part of your body. ? Confusion. ? A bad headache.  You have two or more seizures in a row.  You do not wake up right after a seizure.  You get hurt during a seizure. These symptoms may be an emergency. Do not wait to see if the symptoms will go away. Get medical help right away. Call your local emergency services (911 in the [place] Do not drive yourself to the hospital. Summary  Seizures usually last from 30 seconds to 2 minutes. Usually, they are not harmful unless they last a long time.  Do not eat or drink anything that may keep your medicine from working, such as alcohol.  Teach friends and family what to do when you have a seizure.  Contact your doctor each time you have a seizure. This information is not intended to replace advice given to you by your health care provider. Make sure you discuss any questions you have with your health care provider. Document Revised: 12/15/2018 Document Reviewed: 12/15/2018 Elsevier Patient Education  [place].

## 2019-11-07 ENCOUNTER — Other Ambulatory Visit: Payer: Self-pay | Admitting: Hematology

## 2019-11-07 DIAGNOSIS — E2839 Other primary ovarian failure: Secondary | ICD-10-CM

## 2019-12-17 ENCOUNTER — Other Ambulatory Visit: Payer: Self-pay | Admitting: Internal Medicine

## 2019-12-17 DIAGNOSIS — F17219 Nicotine dependence, cigarettes, with unspecified nicotine-induced disorders: Secondary | ICD-10-CM

## 2019-12-18 NOTE — Progress Notes (Signed)
[place]   [phone] [phone]   Clinic Follow up Note   Patient Care Team: Jani Gravel, MD as PCP - General (Internal Medicine) [name], MD as Consulting Physician (General Surgery) Truitt Merle, MD as Consulting Physician (Hematology) Kyung Rudd, MD as Consulting Physician (Radiation Oncology) Gardenia Phlegm, NP as Nurse Practitioner (Hematology and Oncology)  Date of Service:  12/26/2019  CHIEF COMPLAINT: Follow up left Breast cancer  SUMMARY OF ONCOLOGIC HISTORY: Oncology History Overview Note  Cancer Staging Malignant neoplasm of upper-inner quadrant of left breast in female, estrogen receptor positive ([place]) Staging form: Breast, AJCC 8th Edition - Clinical stage from 02/16/2017: Stage IA (cT1b, cN0, cM0, G2, ER: Positive, PR: Positive, HER2: Negative) - Unsigned Staging comments: Staged at breast conference  - Pathologic stage from 03/16/2017: Stage IA (pT1b, pN0, cM0, G2, ER: Positive, PR: Positive, HER2: Negative, Oncotype DX score: 27) - Signed by Truitt Merle, MD on 04/24/2017     Malignant neoplasm of upper-inner quadrant of left breast in female, estrogen receptor positive ([place])  02/09/2017 Initial Diagnosis   Malignant neoplasm of upper-inner quadrant of left breast in female, estrogen receptor positive ([place])   02/09/2017 Initial Biopsy   Diagnosis Breast, left, needle core biopsy - INVASIVE DUCTAL CARCINOMA, 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 Dalbert Batman    03/16/2017 Pathology Results   PATHOLOGY REPORT Diagnosis 03/16/17 1. Breast, lumpectomy, Left - INVASIVE DUCTAL CARCINOMA,  [place] GRADE 2 OF 3, 0.9 CM - DUCTAL CARCINOMA IN SITU - MARGINS UNINVOLVED BY CARCINOMA (0.3 CM POSTERIOR MARGIN) - PREVIOUS BIOPSY SITE CHANGES - SEE ONCOLOGY TABLE BELOW 2. Lymph node, sentinel, biopsy, Left axillary #1 - NO CARCINOMA IDENTIFIED IN ONE LYMPH NODE (0/1) 3. Lymph node, sentinel, biopsy, Left axillary #2 - NO CARCINOMA IDENTIFIED IN ONE LYMPH NODE (0/1) 4. Lymph node, sentinel, biopsy, Left axillary #3 - NO CARCINOMA IDENTIFIED IN ONE LYMPH NODE (0/1) 5. Lymph node, sentinel, biopsy, Left axillary #4 - NO CARCINOMA IDENTIFIED IN ONE LYMPH NODE (0/1)    03/16/2017 Oncotype testing   Recurrance score of 27 with a 18% distance recurrance in the net 10 years with Tamoxifen alone    05/19/2017 - 06/16/2017 Radiation Therapy   Radiation with Dr. Lisbeth Renshaw   06/2017 -  Anti-estrogen oral therapy   anastrozole 1 mg once a day starting late 06/2017        CURRENT THERAPY:  anastrozole 1 mg once a day starting 06/2017  INTERVAL HISTORY:  [name] is here for a follow up of left breast cancer. She was last seen by me 6 months ago. She presents to the clinic alone. She notes she is doing well. She notes she is tolerating anastrozole well. She notes b/l knee tendon pain which she uses Voltaren for. She notes her BG is doing well. I reviewed her medication list with her. She has reduced smoking to half a ppd and still trying to quit completely. She notes sometimes her mood will feel down but not everyday. She notes she is still able to do her routine activities.     REVIEW OF SYSTEMS:   Constitutional: Denies fevers, chills or abnormal weight loss Eyes: Denies blurriness  of vision Ears, nose, mouth, throat, and face: Denies mucositis or sore throat Respiratory: Denies cough, dyspnea or wheezes Cardiovascular: Denies palpitation, chest discomfort or lower extremity swelling Gastrointestinal:  Denies nausea, heartburn or change in bowel habits Skin: Denies abnormal skin  rashes MSK: (+) Occasional b/l knee tendon pain Lymphatics: Denies new lymphadenopathy or easy bruising Neurological:Denies numbness, tingling or new weaknesses Behavioral/Psych: Mood is stable, no new changes  All other systems were reviewed with the patient and are negative.  MEDICAL HISTORY:  Past Medical History:  Diagnosis Date  . CAD (coronary artery disease)    Cypher stent 09/2002  . Cancer [facility]) 2018   left breast, radiation therapy  . CHF (congestive heart failure) ([place])   . Depression   . Diabetes mellitus ([place])   . Hyperlipidemia   . Hypertension   . Myocardial infarction ([place])    2003,went into cardiac shock  . Osteoporosis   . Seizures ([place])    1st seizure age late 72's; most recent 07/28/17  . Sinusitis     SURGICAL HISTORY: Past Surgical History:  Procedure Laterality Date  . ABDOMINAL HYSTERECTOMY    . APPENDECTOMY    . BREAST LUMPECTOMY WITH RADIOACTIVE SEED AND SENTINEL LYMPH NODE BIOPSY Left 03/16/2017   Procedure: INJECT BLUE DYE LEFT BREAST, LEFT BREAST LUMPECTOMY WITH RADIOACTIVE SEED AND LEFT AXILLARY DEEP SENTINEL LYMPH NODE  BIOPSY ADJACENT TISSUE TRANSFER;  Surgeon: [name], MD;  Location: [place];  Service: General;  Laterality: Left;  . CARPAL TUNNEL RELEASE Bilateral   . COLONOSCOPY WITH PROPOFOL N/A 05/30/2015   Procedure: COLONOSCOPY WITH PROPOFOL;  Surgeon: Carol Ada, MD;  Location: [facility];  Service: Endoscopy;  Laterality: N/A;  . COLONOSCOPY WITH PROPOFOL N/A 06/09/2018   Procedure: COLONOSCOPY WITH PROPOFOL;  Surgeon: Carol Ada, MD;  Location: [facility];  Service: Endoscopy;  Laterality: N/A;  . CORONARY ANGIOPLASTY WITH STENT PLACEMENT  10-02-2002  . Exploratory abdominal     Bleeding in abdomen after tubal  . POLYPECTOMY  06/09/2018   Procedure: POLYPECTOMY;  Surgeon: Carol Ada, MD;  Location: [facility];  Service: Endoscopy;;  . TUBAL LIGATION    . UMBILICAL HERNIA REPAIR      I have reviewed the social  history and family history with the patient and they are unchanged from previous note.  ALLERGIES:  is allergic to ace inhibitors; keflex [cephalexin]; phenobarbital; sulfate; and altace [ramipril].  MEDICATIONS:  Current Outpatient Medications  Medication Sig Dispense Refill  . insulin lispro (HUMALOG) 100 UNIT/ML injection Inject 9 Units into the skin at bedtime.    Marland Kitchen acetaminophen (TYLENOL) 500 MG tablet Take 1,000 mg by mouth every 6 (six) hours as needed for moderate pain or headache.    . anastrozole (ARIMIDEX) 1 MG tablet Take 1 tablet by mouth once daily 90 tablet 0  . aspirin EC 81 MG tablet Take 81 mg by mouth at bedtime.    Marland Kitchen atorvastatin (LIPITOR) 40 MG tablet Take 40 mg by mouth at bedtime.    . Calcium Carbonate-Vitamin D 600-400 MG-UNIT per tablet Take 1 tablet by mouth daily.     . carvedilol (COREG) 3.125 MG tablet Take 3.125 mg by mouth 2 (two) times daily.     . clonazePAM (KLONOPIN) 1 MG tablet Take 1 mg by mouth at bedtime.     Marland Kitchen glimepiride (AMARYL) 4 MG tablet Take 4 mg by mouth 2 (two) times daily.    . halobetasol (ULTRAVATE) 0.05 % cream Apply 1 application topically 2 (two)  times daily as needed (for rash).     . Insulin Detemir (LEVEMIR) 100 UNIT/ML Pen Inject 17-25 Units into the skin See admin instructions. Inject 25 units SQ in the morning and inject 17 units SQ at night    . insulin lispro (HUMALOG) 100 UNIT/ML injection Inject 7 Units into the skin 2 (two) times daily with breakfast and lunch.     Marland Kitchen JANUVIA 100 MG tablet Take 100 mg by mouth at bedtime.     Marland Kitchen JARDIANCE 10 MG TABS tablet Take 10 mg by mouth daily.    Marland Kitchen levETIRAcetam (KEPPRA) 500 MG tablet Take 1 tablet (500 mg total) by mouth 2 (two) times daily. 180 tablet 3  . losartan (COZAAR) 50 MG tablet Take 50 mg by mouth at bedtime.     . metFORMIN (GLUCOPHAGE-XR) 500 MG 24 hr tablet Take 500 mg by mouth 2 (two) times daily.    . Multiple Vitamin (MULTIVITAMIN WITH MINERALS) TABS tablet Take 1 tablet by  mouth at bedtime.    . nitroGLYCERIN (NITROSTAT) 0.4 MG SL tablet Place 1 tablet (0.4 mg total) under the tongue every 5 (five) minutes as needed for chest pain. 25 tablet 1  . Omega-3 Fatty Acids (FISH OIL) 1200 MG CAPS Take 2,400 mg by mouth 2 (two) times daily.    Marland Kitchen omeprazole (PRILOSEC) 20 MG capsule Take 20 mg by mouth at bedtime.     . pioglitazone (ACTOS) 30 MG tablet Take 30 mg by mouth at bedtime.    Vladimir Faster Glycol-Propyl Glycol (SYSTANE) 0.4-0.3 % SOLN Place 1 drop into both eyes 2 (two) times daily.     . risedronate (ACTONEL) 150 MG tablet Take 150 mg by mouth every 30 (thirty) days.    Marland Kitchen sertraline (ZOLOFT) 100 MG tablet Take 100 mg by mouth at bedtime.     No current facility-administered medications for this visit.    PHYSICAL EXAMINATION: ECOG PERFORMANCE STATUS: 1 - Symptomatic but completely ambulatory  Vitals:   12/26/19 1116  BP: 119/72  Pulse: 65  Resp: 16  Temp: 97.8 F (36.6 C)  SpO2: 95%   Filed Weights   12/26/19 1116  Weight: 121 lb 11.2 oz (55.2 kg)    GENERAL:alert, no distress and comfortable SKIN: skin color, texture, turgor are normal, no rashes or significant lesions EYES: normal, Conjunctiva are pink and non-injected, sclera clear  NECK: supple, thyroid normal size, non-tender, without nodularity LYMPH:  no palpable lymphadenopathy in the cervical, axillary  LUNGS: clear to auscultation and percussion with normal breathing effort HEART: regular rate & rhythm and no murmurs and no lower extremity edema ABDOMEN:abdomen soft, non-tender and normal bowel sounds Musculoskeletal:no cyanosis of digits and no clubbing  NEURO: alert & oriented x 3 with fluent speech, no focal motor/sensory deficits BREAST: S/p left lumpectomy: Surgical incisions healed well with mild scar tissue. No palpable mass, nodules or adenopathy bilaterally. Breast exam benign.   LABORATORY DATA:  I have reviewed the data as listed CBC Latest Ref Rng & Units 06/27/2019  08/25/2018 05/25/2018  WBC 4.0 - 10.5 K/uL 6.1 5.9 5.4  Hemoglobin 12.0 - 15.0 g/dL 16.3(H) 16.3(H) 15.6  Hematocrit 36.0 - 46.0 % 50.8(H) 51.6(H) 47.4(H)  Platelets 150 - 400 K/uL 105(L) 110(L) 97(L)     CMP Latest Ref Rng & Units 12/26/2019 06/27/2019 08/25/2018  Glucose 70 - 99 mg/dL 281(H) 174(H) 135(H)  BUN 8 - 23 mg/dL _0 Creatinine 0.44 - 1.00 mg/dL 0.78 0.71 0.73  Sodium 135 -  145 mmol/L 136 139 140  Potassium 3.5 - 5.1 mmol/L 4.4 4.0 4.2  Chloride 98 - 111 mmol/L 103 102 103  CO2 22 - 32 mmol/L _0 Calcium 8.9 - 10.3 mg/dL 9.5 9.8 10.0  Total Protein 6.5 - 8.1 g/dL 7.3 7.1 7.6  Total Bilirubin 0.3 - 1.2 mg/dL 0.5 0.5 0.5  Alkaline Phos 38 - 126 U/L 73 58 60  AST 15 - 41 U/L _1 ALT 0 - 44 U/L _2 RADIOGRAPHIC STUDIES: I have personally reviewed the radiological images as listed and agreed with the findings in the report. No results found.   ASSESSMENT & PLAN:  Monica [name] is a 74 y.o. female with    1. 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 She is s/p left breastlumpectomyand adjuvant radiation.  -the Oncotype Dx result showed intermediate risk based on the recurrence score 27. Given her advanced age, comorbidities, strong ER and PR expression in her tumor, we decided not to pursue adjuvant chemotherapy. -She started antiestrogen therapy with Anastrozole in 06/2017. Shehas tolerated well with no issues so far. -She is clinically doing well. Lab reviewed, her CBC and CMP are within normal limits. Her physical exam and her 02/2019 mammogram were unremarkable. There is no clinical concern for recurrence. -Continue surveillance. Next mammogram in 02/2020 -Continue Anastrozole  -F/u in 6 months    2. DM -She is on insulin and multiple oral diabetic medications -Follows up with Dr. Maudie Mercury her PCP -BG  at 281 today (12/26/19)  3. 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  -No history of liver disease or alcohol abuse. 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 -Will continue to monitor.    4. 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   5. Osteopenia  -Continue calcium and vitamin D -Based on her DEXA from 06/30/17 she is osteopenic with a left femur neck T-Score of -2.4  -We previously discussed the potential negative impact on her bone density from anastrozole. We will follow-up her bone density scan every 2 years -She has started risedronate by her PCP, tolerating well, will continue. -Her latest DEXA was to be in 2020. I will check with Dr. Maudie Mercury her PCP for report.   6. Depression and mood swing -She has become slightly more irritable since she started anastrozole, much improved now -She is on Zoloft 100 mg daily, doing well.She notes having occasional down mood, but not daily or impacting her daily routine.   7. Smoking and lung nodule, Emphysema -She has heavy smoking history, has cut cigarettes to half pack a day -She underwent low-dose CT scan in 02/2018 for lung cancer screening, which showed several small pulmonary nodules, largest in the right upper lobe 7 mm, likely benign, will follow-up. -Screening CT Chest from 08/25/18 shows no evidence of malignancy but does show significant emphysema in her lungs. -Shecontinues trying to cut back on smoking, currently at half a ppd. I encouraged her to continue until she quits completely.   8. Cancer screening  -05/2018 Colonoscopy with Dr. Benson Norway shows her to have Diverticulosis with multiple benign polyps for which she is at high risk for colon cancer. She will  continue to follow up with him and  repeatcolonoscopyin 3 years.   PLAN: -Request 2020 DEXA from her PCP office -She is clinically doing well. -Continue anastrozole -[facility] in 02/[facility] and f/u in6 months   No problem-specific Assessment & Plan notes found for this encounter.   No orders of the defined types were placed in this encounter.  All questions were answered. The patient knows to call the clinic with any problems, questions or concerns. No barriers to learning was detected. The total time spent in the appointment was 30 minutes.     Truitt Merle, MD 12/26/2019   I, Joslyn Devon, am acting as scribe for Truitt Merle, MD.   I have reviewed the above documentation for accuracy and completeness, and I agree with the above.

## 2019-12-26 ENCOUNTER — Inpatient Hospital Stay: Payer: Medicare Other | Attending: Hematology

## 2019-12-26 ENCOUNTER — Ambulatory Visit
Admission: RE | Admit: 2019-12-26 | Discharge: 2019-12-26 | Disposition: A | Discharge status: Home / Self Care | Payer: Medicare Other | Source: Ambulatory Visit | Attending: Internal Medicine | Admitting: Internal Medicine

## 2019-12-26 ENCOUNTER — Inpatient Hospital Stay: Payer: Medicare Other | Admitting: Hematology

## 2019-12-26 ENCOUNTER — Other Ambulatory Visit: Payer: Self-pay

## 2019-12-26 VITALS — BP 119/72 | HR 65 | Temp 97.8°F | Resp 16 | Ht 65.0 in | Wt 121.7 lb

## 2019-12-26 DIAGNOSIS — J439 Emphysema, unspecified: Secondary | ICD-10-CM | POA: Diagnosis not present

## 2019-12-26 DIAGNOSIS — C50212 Malignant neoplasm of upper-inner quadrant of left female breast: Secondary | ICD-10-CM

## 2019-12-26 DIAGNOSIS — I252 Old myocardial infarction: Secondary | ICD-10-CM | POA: Insufficient documentation

## 2019-12-26 DIAGNOSIS — D696 Thrombocytopenia, unspecified: Secondary | ICD-10-CM | POA: Insufficient documentation

## 2019-12-26 DIAGNOSIS — F329 Major depressive disorder, single episode, unspecified: Secondary | ICD-10-CM | POA: Insufficient documentation

## 2019-12-26 DIAGNOSIS — R918 Other nonspecific abnormal finding of lung field: Secondary | ICD-10-CM | POA: Insufficient documentation

## 2019-12-26 DIAGNOSIS — Z79811 Long term (current) use of aromatase inhibitors: Secondary | ICD-10-CM | POA: Diagnosis not present

## 2019-12-26 DIAGNOSIS — Z794 Long term (current) use of insulin: Secondary | ICD-10-CM | POA: Insufficient documentation

## 2019-12-26 DIAGNOSIS — Z170 Estrogen receptor positive status [ER+]: Secondary | ICD-10-CM | POA: Insufficient documentation

## 2019-12-26 DIAGNOSIS — E785 Hyperlipidemia, unspecified: Secondary | ICD-10-CM | POA: Diagnosis not present

## 2019-12-26 DIAGNOSIS — F17210 Nicotine dependence, cigarettes, uncomplicated: Secondary | ICD-10-CM | POA: Diagnosis not present

## 2019-12-26 DIAGNOSIS — I509 Heart failure, unspecified: Secondary | ICD-10-CM | POA: Insufficient documentation

## 2019-12-26 DIAGNOSIS — Z79899 Other long term (current) drug therapy: Secondary | ICD-10-CM | POA: Insufficient documentation

## 2019-12-26 DIAGNOSIS — M8580 Other specified disorders of bone density and structure, unspecified site: Secondary | ICD-10-CM | POA: Diagnosis not present

## 2019-12-26 DIAGNOSIS — F17219 Nicotine dependence, cigarettes, with unspecified nicotine-induced disorders: Secondary | ICD-10-CM

## 2019-12-26 DIAGNOSIS — D751 Secondary polycythemia: Secondary | ICD-10-CM | POA: Diagnosis not present

## 2019-12-26 DIAGNOSIS — I110 Hypertensive heart disease with heart failure: Secondary | ICD-10-CM | POA: Insufficient documentation

## 2019-12-26 DIAGNOSIS — E119 Type 2 diabetes mellitus without complications: Secondary | ICD-10-CM | POA: Insufficient documentation

## 2019-12-26 DIAGNOSIS — Z7982 Long term (current) use of aspirin: Secondary | ICD-10-CM | POA: Diagnosis not present

## 2019-12-26 LAB — COMPREHENSIVE METABOLIC PANEL
ALT: 23 U/L (ref 0–44)
AST: 18 U/L (ref 15–41)
Albumin: 4.1 g/dL (ref 3.5–5.0)
Alkaline Phosphatase: 73 U/L (ref 38–126)
Anion gap: 9 (ref 5–15)
BUN: 21 mg/dL (ref 8–23)
CO2: 24 mmol/L (ref 22–32)
Calcium: 9.5 mg/dL (ref 8.9–10.3)
Chloride: 103 mmol/L (ref 98–111)
Creatinine, Ser: 0.78 mg/dL (ref 0.44–1.00)
GFR calc Af Amer: >60 mL/min (ref >60)
GFR calc non Af Amer: >60 mL/min (ref >60)
Glucose, Bld: 281 mg/dL — ABNORMAL HIGH (ref 70–99)
Potassium: 4.4 mmol/L (ref 3.5–5.1)
Sodium: 136 mmol/L (ref 135–145)
Total Bilirubin: 0.5 mg/dL (ref 0.3–1.2)
Total Protein: 7.3 g/dL (ref 6.5–8.1)

## 2019-12-27 ENCOUNTER — Encounter: Payer: Self-pay | Admitting: Hematology

## 2019-12-27 ENCOUNTER — Telehealth: Payer: Self-pay | Admitting: Hematology

## 2019-12-27 NOTE — Telephone Encounter (Signed)
Scheduled appt per 3/17 los.  Sent a message to HIM pool to get a calendar mailed out.

## 2020-01-09 LAB — JAK 2 EXON 12 (GENPATH)

## 2020-01-09 LAB — JAK 2 V617F (GENPATH)

## 2020-01-25 ENCOUNTER — Telehealth: Payer: Self-pay | Admitting: Hematology

## 2020-01-25 ENCOUNTER — Other Ambulatory Visit: Payer: Self-pay | Admitting: Hematology

## 2020-01-25 DIAGNOSIS — D696 Thrombocytopenia, unspecified: Secondary | ICD-10-CM

## 2020-01-25 DIAGNOSIS — D751 Secondary polycythemia: Secondary | ICD-10-CM

## 2020-01-25 NOTE — Telephone Encounter (Signed)
I called pt and explained her MPN genomic testing panel results to her. 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 I discussed with hem pathologist Dr. Gari Crown also. Since this is not definitive for MPN diagnosis, I will check her iron study, B12, folate acid, methylmalonic acid level, etc., to rule out nutritional related causes, it that's negative, I plan to get a bone marrow biopsy down the road. I discussed the above with patient, he is agreeable with the plan.  I will schedule her lab testing in the next few weeks.  Truitt Merle  01/25/2020

## 2020-02-06 ENCOUNTER — Other Ambulatory Visit: Payer: Self-pay | Admitting: Hematology

## 2020-02-06 DIAGNOSIS — E2839 Other primary ovarian failure: Secondary | ICD-10-CM

## 2020-02-12 ENCOUNTER — Inpatient Hospital Stay: Payer: Medicare Other | Attending: Hematology

## 2020-02-12 ENCOUNTER — Other Ambulatory Visit: Payer: Self-pay

## 2020-02-12 DIAGNOSIS — Z170 Estrogen receptor positive status [ER+]: Secondary | ICD-10-CM | POA: Diagnosis not present

## 2020-02-12 DIAGNOSIS — E119 Type 2 diabetes mellitus without complications: Secondary | ICD-10-CM | POA: Insufficient documentation

## 2020-02-12 DIAGNOSIS — D696 Thrombocytopenia, unspecified: Secondary | ICD-10-CM | POA: Diagnosis not present

## 2020-02-12 DIAGNOSIS — E611 Iron deficiency: Secondary | ICD-10-CM | POA: Diagnosis not present

## 2020-02-12 DIAGNOSIS — M85852 Other specified disorders of bone density and structure, left thigh: Secondary | ICD-10-CM | POA: Diagnosis not present

## 2020-02-12 DIAGNOSIS — Z79811 Long term (current) use of aromatase inhibitors: Secondary | ICD-10-CM | POA: Diagnosis not present

## 2020-02-12 DIAGNOSIS — C50212 Malignant neoplasm of upper-inner quadrant of left female breast: Secondary | ICD-10-CM | POA: Insufficient documentation

## 2020-02-12 DIAGNOSIS — D751 Secondary polycythemia: Secondary | ICD-10-CM | POA: Diagnosis not present

## 2020-02-12 DIAGNOSIS — J439 Emphysema, unspecified: Secondary | ICD-10-CM | POA: Diagnosis not present

## 2020-02-12 DIAGNOSIS — F17210 Nicotine dependence, cigarettes, uncomplicated: Secondary | ICD-10-CM | POA: Diagnosis not present

## 2020-02-12 DIAGNOSIS — F329 Major depressive disorder, single episode, unspecified: Secondary | ICD-10-CM | POA: Insufficient documentation

## 2020-02-12 LAB — RETICULOCYTES: RBC.: 6.46 MIL/uL — ABNORMAL HIGH (ref 3.87–5.11)

## 2020-02-12 LAB — CBC WITH DIFFERENTIAL (CANCER CENTER ONLY)
Abs Immature Granulocytes: 0.02 10*3/uL (ref 0.00–0.07)
Basophils Absolute: 0 10*3/uL (ref 0.0–0.1)
Basophils Relative: 0 %
Eosinophils Absolute: 0.1 10*3/uL (ref 0.0–0.5)
Eosinophils Relative: 2 %
HCT: 51.6 % — ABNORMAL HIGH (ref 36.0–46.0)
Hemoglobin: 16.6 g/dL — ABNORMAL HIGH (ref 12.0–15.0)
Immature Granulocytes: 0 %
Lymphocytes Relative: 27 %
Lymphs Abs: 1.9 10*3/uL (ref 0.7–4.0)
MCH: 25.7 pg — ABNORMAL LOW (ref 26.0–34.0)
MCHC: 32.2 g/dL (ref 30.0–36.0)
MCV: 79.9 fL — ABNORMAL LOW (ref 80.0–100.0)
Monocytes Absolute: 0.4 10*3/uL (ref 0.1–1.0)
Monocytes Relative: 6 %
Neutro Abs: 4.7 10*3/uL (ref 1.7–7.7)
Neutrophils Relative %: 65 %
Platelet Count: 126 10*3/uL — ABNORMAL LOW (ref 150–400)
RBC: 6.46 MIL/uL — ABNORMAL HIGH (ref 3.87–5.11)
RDW: 17.2 % — ABNORMAL HIGH (ref 11.5–15.5)
WBC Count: 7.1 10*3/uL (ref 4.0–10.5)
nRBC: 0 % (ref 0.0–0.2)

## 2020-02-12 LAB — COMPREHENSIVE METABOLIC PANEL
ALT: 22 U/L (ref 0–44)
AST: 20 U/L (ref 15–41)
Albumin: 4 g/dL (ref 3.5–5.0)
Alkaline Phosphatase: 76 U/L (ref 38–126)
Anion gap: 12 (ref 5–15)
BUN: 14 mg/dL (ref 8–23)
CO2: 23 mmol/L (ref 22–32)
Calcium: 9 mg/dL (ref 8.9–10.3)
Chloride: 106 mmol/L (ref 98–111)
Creatinine, Ser: 0.71 mg/dL (ref 0.44–1.00)
GFR calc Af Amer: >60 mL/min (ref >60)
GFR calc non Af Amer: >60 mL/min (ref >60)
Glucose, Bld: 177 mg/dL — ABNORMAL HIGH (ref 70–99)
Potassium: 4.2 mmol/L (ref 3.5–5.1)
Sodium: 141 mmol/L (ref 135–145)
Total Bilirubin: 0.4 mg/dL (ref 0.3–1.2)
Total Protein: 7.4 g/dL (ref 6.5–8.1)

## 2020-02-12 LAB — FERRITIN: Ferritin: 6 ng/mL — ABNORMAL LOW (ref 11–307)

## 2020-02-12 LAB — IRON AND TIBC
Iron: 58 ug/dL (ref 41–142)
Saturation Ratios: 14 % — ABNORMAL LOW (ref 21–57)
TIBC: 423 ug/dL (ref 236–444)
UIBC: 365 ug/dL (ref 120–384)

## 2020-02-12 LAB — VITAMIN B12: Vitamin B-12: 540 pg/mL (ref 180–914)

## 2020-02-13 LAB — FOLATE RBC
Folate, Hemolysate: >620 ng/mL
Folate, RBC: >1195 ng/mL (ref >498)
Hematocrit: 51.9 % — ABNORMAL HIGH (ref 34.0–46.6)

## 2020-02-14 ENCOUNTER — Encounter: Payer: Self-pay | Admitting: Hematology

## 2020-02-14 ENCOUNTER — Inpatient Hospital Stay (HOSPITAL_BASED_OUTPATIENT_CLINIC_OR_DEPARTMENT_OTHER): Payer: Medicare Other | Admitting: Hematology

## 2020-02-14 ENCOUNTER — Other Ambulatory Visit: Payer: Self-pay

## 2020-02-14 DIAGNOSIS — C50212 Malignant neoplasm of upper-inner quadrant of left female breast: Secondary | ICD-10-CM | POA: Diagnosis not present

## 2020-02-14 DIAGNOSIS — Z170 Estrogen receptor positive status [ER+]: Secondary | ICD-10-CM

## 2020-02-14 DIAGNOSIS — D509 Iron deficiency anemia, unspecified: Secondary | ICD-10-CM | POA: Insufficient documentation

## 2020-02-14 DIAGNOSIS — E611 Iron deficiency: Secondary | ICD-10-CM

## 2020-02-14 DIAGNOSIS — D751 Secondary polycythemia: Secondary | ICD-10-CM | POA: Insufficient documentation

## 2020-02-14 LAB — ERYTHROPOIETIN: Erythropoietin: 22 m[IU]/mL — ABNORMAL HIGH (ref 2.6–18.5)

## 2020-02-14 NOTE — Progress Notes (Signed)
[facility]   [phone] [phone]   Clinic Follow up Note   Patient Care Team: Jani Gravel, MD as PCP - General (Internal Medicine) [name], MD as Consulting Physician (General Surgery) Truitt Merle, MD as Consulting Physician (Hematology) Kyung Rudd, MD as Consulting Physician (Radiation Oncology) Gardenia Phlegm, NP as Nurse Practitioner (Hematology and Oncology)   I connected with Monica [name] on 02/14/2020 at  9:20 AM EDT by telephone visit and verified that I am speaking with the correct person using two identifiers.  I discussed the limitations, risks, security and privacy concerns of performing an evaluation and management service by telephone and the availability of in person appointments. I also discussed with the patient that there may be a patient responsible charge related to this service. The patient expressed understanding and agreed to proceed.   Other persons participating in the visit and their role in the encounter:  None  Patient's location:  Her home Provider's location:  My Office   CHIEF COMPLAINT: Follow up left Breast cancer  SUMMARY OF ONCOLOGIC HISTORY: Oncology History Overview Note  Cancer Staging Malignant neoplasm of upper-inner quadrant of left breast in female, estrogen receptor positive ([place]) Staging form: Breast, AJCC 8th Edition - Clinical stage from 02/16/2017: Stage IA (cT1b, cN0, cM0, G2, ER: Positive, PR: Positive, HER2: Negative) - Unsigned Staging comments: Staged at breast conference  - Pathologic stage from 03/16/2017: Stage IA (pT1b, pN0, cM0, G2, ER: Positive, PR: Positive, HER2: Negative, Oncotype DX score: 27) - Signed by Truitt Merle, MD on 04/24/2017     Malignant neoplasm of upper-inner quadrant of left breast in female, estrogen receptor positive ([place])  02/09/2017 Initial Diagnosis   Malignant neoplasm of upper-inner quadrant of left breast in female, estrogen receptor positive ([place])   02/09/2017 Initial Biopsy   Diagnosis Breast, left, needle core biopsy - INVASIVE DUCTAL CARCINOMA, 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 Dalbert Batman    03/16/2017 Pathology Results   PATHOLOGY REPORT Diagnosis 03/16/17 1. Breast, lumpectomy, Left - INVASIVE DUCTAL CARCINOMA, NOTTINGHAM GRADE 2 OF 3, 0.9 CM - DUCTAL CARCINOMA IN SITU - MARGINS UNINVOLVED BY CARCINOMA (0.3 CM POSTERIOR MARGIN) - PREVIOUS BIOPSY SITE CHANGES - SEE ONCOLOGY TABLE BELOW 2. Lymph node, sentinel, biopsy, Left axillary #1 - NO CARCINOMA IDENTIFIED IN ONE LYMPH NODE (0/1) 3. Lymph node, sentinel, biopsy, Left axillary #2 - NO CARCINOMA IDENTIFIED IN ONE LYMPH NODE (0/1) 4. Lymph node, sentinel, biopsy, Left axillary #3 - NO CARCINOMA IDENTIFIED IN ONE LYMPH NODE (0/1) 5. Lymph node, sentinel, biopsy, Left axillary #4 - NO CARCINOMA IDENTIFIED IN ONE LYMPH NODE (0/1)    03/16/2017 Oncotype testing   Recurrance score of 27 with a 18% distance recurrance in the net 10 years with Tamoxifen alone    05/19/2017 - 06/16/2017 Radiation Therapy   Radiation with Dr. Lisbeth Renshaw   06/2017 -  Anti-estrogen oral therapy   anastrozole 1 mg once a day starting late 06/2017        CURRENT THERAPY:  anastrozole 1 mg once a day starting 06/2017  INTERVAL HISTORY:  Monica [name] is here for a follow up of left breast. She notes she is doing well.  She denies bleeding of nose, gums or bloody or black stool. She did have Diverticulosis and 3-4 polyps removed with her last colonoscopy in 05/2018. She was to repeat in 3-5 years. She denies recent BM issues with constipation or diarrhea. She notes to having  a BM every 1-2 days.    REVIEW OF SYSTEMS:   Constitutional: Denies fevers, chills or abnormal weight loss Eyes: Denies blurriness of vision Ears, nose, mouth, throat, and face: Denies mucositis or sore throat Respiratory: Denies cough, dyspnea or wheezes Cardiovascular: Denies palpitation, chest discomfort or lower extremity swelling Gastrointestinal:  Denies nausea, heartburn or change in bowel habits Skin: Denies abnormal skin rashes Lymphatics: Denies new lymphadenopathy or easy bruising Neurological:Denies numbness, tingling or new weaknesses Behavioral/Psych: Mood is stable, no new changes  All other systems were reviewed with the patient and are negative.  MEDICAL HISTORY:  Past Medical History:  Diagnosis Date  . CAD (coronary artery disease)    Cypher stent 09/2002  . Cancer [facility]) 2018   left breast, radiation therapy  . CHF (congestive heart failure) ([place])   . Depression   . Diabetes mellitus ([place])   . Hyperlipidemia   . Hypertension   . Myocardial infarction ([place])    2003,went into cardiac shock  . Osteoporosis   . Seizures ([place])    1st seizure age late 1's; most recent 07/28/17  . Sinusitis     SURGICAL HISTORY: Past Surgical History:  Procedure Laterality Date  . ABDOMINAL HYSTERECTOMY    . APPENDECTOMY    . BREAST LUMPECTOMY WITH RADIOACTIVE SEED AND SENTINEL LYMPH NODE BIOPSY Left 03/16/2017   Procedure: INJECT BLUE DYE LEFT BREAST, LEFT BREAST LUMPECTOMY WITH RADIOACTIVE SEED AND LEFT AXILLARY DEEP SENTINEL LYMPH NODE  BIOPSY ADJACENT TISSUE TRANSFER;  Surgeon: [name], MD;  Location: [facility];  Service: General;  Laterality: Left;  . CARPAL TUNNEL RELEASE Bilateral   . COLONOSCOPY WITH PROPOFOL N/A 05/30/2015   Procedure: COLONOSCOPY WITH PROPOFOL;  Surgeon: Carol Ada, MD;  Location: [facility];  Service: Endoscopy;  Laterality: N/A;  . COLONOSCOPY WITH PROPOFOL N/A 06/09/2018   Procedure: COLONOSCOPY WITH PROPOFOL;  Surgeon: Carol Ada, MD;   Location: [facility];  Service: Endoscopy;  Laterality: N/A;  . CORONARY ANGIOPLASTY WITH STENT PLACEMENT  10-02-2002  . Exploratory abdominal     Bleeding in abdomen after tubal  . POLYPECTOMY  06/09/2018   Procedure: POLYPECTOMY;  Surgeon: Carol Ada, MD;  Location: [facility];  Service: Endoscopy;;  . TUBAL LIGATION    . UMBILICAL HERNIA REPAIR      I have reviewed the social history and family history with the patient and they are unchanged from previous note.  ALLERGIES:  is allergic to ace inhibitors; keflex [cephalexin]; phenobarbital; sulfate; and altace [ramipril].  MEDICATIONS:  Current Outpatient Medications  Medication Sig Dispense Refill  . acetaminophen (TYLENOL) 500 MG tablet Take 1,000 mg by mouth every 6 (six) hours as needed for moderate pain or headache.    . anastrozole (ARIMIDEX) 1 MG tablet Take 1 tablet by mouth once daily 90 tablet 0  . aspirin EC 81 MG tablet Take 81 mg by mouth at bedtime.    Marland Kitchen atorvastatin (LIPITOR) 40 MG tablet Take 40 mg by mouth at bedtime.    . Calcium Carbonate-Vitamin D 600-400 MG-UNIT per tablet Take 1 tablet by mouth daily.     . carvedilol (COREG) 3.125 MG tablet Take 3.125 mg by mouth 2 (two) times daily.     . clonazePAM (KLONOPIN)  1 MG tablet Take 1 mg by mouth at bedtime.     Marland Kitchen glimepiride (AMARYL) 4 MG tablet Take 4 mg by mouth 2 (two) times daily.    . halobetasol (ULTRAVATE) 0.05 % cream Apply 1 application topically 2 (two) times daily as needed (for rash).     . Insulin Detemir (LEVEMIR) 100 UNIT/ML Pen Inject 17-25 Units into the skin See admin instructions. Inject 25 units SQ in the morning and inject 17 units SQ at night    . insulin lispro (HUMALOG) 100 UNIT/ML injection Inject 7 Units into the skin 2 (two) times daily with breakfast and lunch.     . insulin lispro (HUMALOG) 100 UNIT/ML injection Inject 9 Units into the skin at bedtime.    Marland Kitchen JANUVIA 100 MG tablet Take 100 mg by mouth at bedtime.     Marland Kitchen JARDIANCE 10  MG TABS tablet Take 10 mg by mouth daily.    Marland Kitchen levETIRAcetam (KEPPRA) 500 MG tablet Take 1 tablet (500 mg total) by mouth 2 (two) times daily. 180 tablet 3  . losartan (COZAAR) 50 MG tablet Take 50 mg by mouth at bedtime.     . metFORMIN (GLUCOPHAGE-XR) 500 MG 24 hr tablet Take 500 mg by mouth 2 (two) times daily.    . Multiple Vitamin (MULTIVITAMIN WITH MINERALS) TABS tablet Take 1 tablet by mouth at bedtime.    . nitroGLYCERIN (NITROSTAT) 0.4 MG SL tablet Place 1 tablet (0.4 mg total) under the tongue every 5 (five) minutes as needed for chest pain. 25 tablet 1  . Omega-3 Fatty Acids (FISH OIL) 1200 MG CAPS Take 2,400 mg by mouth 2 (two) times daily.    Marland Kitchen omeprazole (PRILOSEC) 20 MG capsule Take 20 mg by mouth at bedtime.     . pioglitazone (ACTOS) 30 MG tablet Take 30 mg by mouth at bedtime.    Vladimir Faster Glycol-Propyl Glycol (SYSTANE) 0.4-0.3 % SOLN Place 1 drop into both eyes 2 (two) times daily.     . risedronate (ACTONEL) 150 MG tablet Take 150 mg by mouth every 30 (thirty) days.    Marland Kitchen sertraline (ZOLOFT) 100 MG tablet Take 100 mg by mouth at bedtime.     No current facility-administered medications for this visit.    PHYSICAL EXAMINATION: ECOG PERFORMANCE STATUS: 0 - Asymptomatic  No vitals taken today, Exam not performed today   LABORATORY DATA:  I have reviewed the data as listed CBC Latest Ref Rng & Units 02/12/2020 02/12/2020 06/27/2019  WBC 4.0 - 10.5 K/uL 7.1 - 6.1  Hemoglobin 12.0 - 15.0 g/dL 16.6(H) - 16.3(H)  Hematocrit 34.0 - 46.6 % 51.9(H) 51.6(H) 50.8(H)  Platelets 150 - 400 K/uL 126(L) - 105(L)     CMP Latest Ref Rng & Units 02/12/2020 12/26/2019 06/27/2019  Glucose 70 - 99 mg/dL 177(H) 281(H) 174(H)  BUN 8 - 23 mg/dL _0 Creatinine 0.44 - 1.00 mg/dL 0.71 0.78 0.71  Sodium 135 - 145 mmol/L 141 136 139  Potassium 3.5 - 5.1 mmol/L 4.2 4.4 4.0  Chloride 98 - 111 mmol/L 106 103 102  CO2 22 - 32 mmol/L _1 Calcium 8.9 - 10.3 mg/dL 9.0 9.5 9.8  Total Protein  6.5 - 8.1 g/dL 7.4 7.3 7.1  Total Bilirubin 0.3 - 1.2 mg/dL 0.4 0.5 0.5  Alkaline Phos 38 - 126 U/L 76 73 58  AST 15 - 41 U/L _2 ALT 0 - 44 U/L 22 23 25  RADIOGRAPHIC STUDIES: I have personally reviewed the radiological images as listed and agreed with the findings in the report. No results found.   ASSESSMENT & PLAN:  Monica [name] is a 74 y.o. female with    1. Secondary Polycythemia  -She has developed erythrocytosis since 2019, with highest hematocritHCT around 51.6% and Hg 16.6.  Her recent erythropoietin was elevated at 22, support secondary polycythemia.  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 Overall not definitve for a MPN diagnosis.  -Lab workup from 02/12/20 show normal B12 and retic panel. She still has with mildly elevated Erythropoietin, RBC, Hg and Hct, but has lower iron 58, ferritin 6, Sat ratio 14.  -I discussed her secondary polycythemia is likely related to her heavy smoking history and COPD. She also has drowsiness during the day time concerning for sleep apnea. I discussed referred her to a pulmonologist for sleep study and establish care for COPD, she is agreeable.  -Overall her Polycythemia is likely secondary, no indication for treatment or bone marrow biopsy at this time.  -I will obtain an US abdomen to evaluate her liver, spleen and kidney for malignancy related secondary polycythemia. She is agreeable.   2. Iron deficiency without anemia -Her recent labs showed evidence of iron deficiency and slight microcytosis -She denies bleeding of nose, gums, bloody or black stool. She did have Diverticulosis and 3-4 polyps removed with her last colonoscopy in 05/2018 which can lead to bleeding.  -Given her lower iron I recommend she start OTC oral iron with Ferrous sulfate. If her iron deficiency dose not improve on oral iron I  may refer her to Dr. Benson Norway to repeat colonoscopy sooner.    3. 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 She is s/p left breastlumpectomyand adjuvant radiation.  -the Oncotype Dx result showed intermediate risk based on the recurrence score 27. Given her advanced age, comorbidities, strong ER and PR expression in her tumor, we decided not to pursue adjuvant chemotherapy. -She started antiestrogen therapy with Anastrozole in 06/2017. Shehas tolerated well with no issues so far. -She is clinically doing well. Continue surveillance. Next mammogram in 02/2020 -Continue Anastrozole     4. 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  -No history of liver disease or alcohol abuse. 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 -Has been improving lately, 126K on 02/12/20. Will continue to monitor.   5. Osteopenia  -Continue calcium and vitamin D -Based on her DEXA from 06/30/17 she is osteopenic with a left femur neck T-Score of -2.4  -We previously discussed the potential negative impact on her bone density from anastrozole. We will follow-up her bone density scan every 2 years -She has started risedronate by her PCP, tolerating well, will continue. -Her latest DEXA was to be in 2020. I will check with Dr. Maudie Mercury her PCP for report.   6. DM -She is on insulin and multiple oral diabetic medications -Follows up with Dr. Maudie Mercury her PCP   7. Depression and mood swing -She has become slightly more irritable since she started anastrozole, much improved now -She is on Zoloft 100 mg daily, doing well.She notes having occasional down mood, but not daily or impacting her daily routine.   8.  Smoking and lung nodule, Emphysema -She has heavy smoking history since in her late 68s, has cut  cigarettes to half pack a day -She underwent low-dose CT scan in 02/2018 for lung cancer screening, which showed several small pulmonary nodules, largest in the right upper lobe 7 mm, likely benign, will follow-up. -Screening CT Chest from 12/27/19 was negative for malignancy but does show emphysema in her lungs. -Shecontinues trying to cut back on smoking.I encouraged her to continueuntil she quits completely.     PLAN: -Start OTC oral iron  -Send pulmonologist referral -F/u in 2 months with Lab and US abdomen a few days before  -Continue anastrozole -Mammogram in 02/2020 At [facility]    No problem-specific Assessment & Plan notes found for this encounter.   No orders of the defined types were placed in this encounter.  I discussed the assessment and treatment plan with the patient. The patient was provided an opportunity to ask questions and all were answered. The patient agreed with the plan and demonstrated an understanding of the instructions.  The patient was advised to call back or seek an in-person evaluation if the symptoms worsen or if the condition fails to improve as anticipated.  The total time spent in the appointment was 25 minutes.    Truitt Merle, MD 02/14/2020   I, Joslyn Devon, am acting as scribe for Truitt Merle, MD.   I have reviewed the above documentation for accuracy and completeness, and I agree with the above.

## 2020-02-15 ENCOUNTER — Inpatient Hospital Stay (HOSPITAL_BASED_OUTPATIENT_CLINIC_OR_DEPARTMENT_OTHER): Payer: Medicare Other | Admitting: Hematology

## 2020-02-15 DIAGNOSIS — D509 Iron deficiency anemia, unspecified: Secondary | ICD-10-CM

## 2020-02-15 DIAGNOSIS — D751 Secondary polycythemia: Secondary | ICD-10-CM

## 2020-02-16 LAB — METHYLMALONIC ACID, SERUM: Methylmalonic Acid, Quantitative: 110 nmol/L (ref 0–378)

## 2020-02-26 ENCOUNTER — Telehealth: Payer: Self-pay

## 2020-02-26 NOTE — Telephone Encounter (Signed)
I left vm for Monica [name] to return my call.  Per Dr. Burr Medico she was unable to order ct scan "(no supporting diagnosis)".  Dr. Burr Medico has ordered an u/s to be done in 05/8020

## 2020-03-07 ENCOUNTER — Other Ambulatory Visit: Payer: Self-pay

## 2020-03-07 ENCOUNTER — Ambulatory Visit (INDEPENDENT_AMBULATORY_CARE_PROVIDER_SITE_OTHER): Payer: Medicare Other | Admitting: Internal Medicine

## 2020-03-07 ENCOUNTER — Encounter: Payer: Self-pay | Admitting: Internal Medicine

## 2020-03-07 DIAGNOSIS — Z720 Tobacco use: Secondary | ICD-10-CM | POA: Diagnosis not present

## 2020-03-07 DIAGNOSIS — G479 Sleep disorder, unspecified: Secondary | ICD-10-CM | POA: Diagnosis not present

## 2020-03-07 DIAGNOSIS — J449 Chronic obstructive pulmonary disease, unspecified: Secondary | ICD-10-CM

## 2020-03-07 MED ORDER — TRELEGY ELLIPTA 200-62.5-25 MCG/INH IN AEPB: 1.0000 | INHALATION_SPRAY | Freq: Every day | RESPIRATORY_TRACT | 0 refills | Status: DC

## 2020-03-07 NOTE — Patient Instructions (Signed)
Order- schedule HST   Dx Nocturnal Hypoxemia, Polycythemia  Order- Schedule PFT   Dx COPD mixed type  Sample Anoro inhaler    Inhale 1 puff, once daily.  See if this helps with cough and shortness of breath.  Please try hard now to stop smoking. Out Pharmacy team can help you with this if you decide you would like to see what they can do.  Please call if we can help

## 2020-03-07 NOTE — Progress Notes (Signed)
03/07/20- 74 yoF Smoker for sleep evaluation.- referred courtesy of Dr Burr Medico with concern of polycythemia. Medical problem list includes- Aortic Atherosclerosis, CAD/MI/ stent, DM2, Seizure Disorder, Dyslipidemia, Breast Cancer L(Lumpectomy/ XRT), Secondary Polycythemia, Tobacco Abuse,  She complains of daytime sleepiness. Occasional 1 hour afternoon naps help. Asleep before tv times out. Sleeps through the night. No sleep meds. Occasional decaf coffee. Husband doesn't tell her she snores. Smoking now about 1/2 ppd. Known emphysema. No wheeze. Some cough with scant clear mucus.Admits DOE only with brisk sustained walking.  CT chest Low dose screen 12/27/19-  IMPRESSION: 1. Lung-RADS 2, benign appearance or behavior. Continue annual screening with low-dose chest CT without contrast in 12 months. 2. Ascending thoracic aorta measures 4.2 cm diameter. Continued attention on annual lung cancer screening surveillance recommended. 3.  Emphysema (ICD10-J43.9) and Aortic Atherosclerosis (ICD10-170.0)  Prior to Admission medications   Medication Sig Start Date End Date Taking? Authorizing Provider  acetaminophen (TYLENOL) 500 MG tablet Take 1,000 mg by mouth every 6 (six) hours as needed for moderate pain or headache.   Yes [provider]  anastrozole (ARIMIDEX) 1 MG tablet Take 1 tablet by mouth once daily 02/07/20  Yes Truitt Merle, MD  aspirin EC 81 MG tablet Take 81 mg by mouth at bedtime.   Yes [provider]  atorvastatin (LIPITOR) 40 MG tablet Take 40 mg by mouth at bedtime. 12/07/16  Yes [provider]  Calcium Carbonate-Vitamin D 600-400 MG-UNIT per tablet Take 1 tablet by mouth daily.    Yes [provider]  carvedilol (COREG) 3.125 MG tablet Take 3.125 mg by mouth 2 (two) times daily.    Yes [provider]  clonazePAM (KLONOPIN) 1 MG tablet Take 1 mg by mouth at bedtime.  03/02/16  Yes [provider]  glimepiride (AMARYL) 4 MG tablet Take 4 mg  by mouth 2 (two) times daily. 03/03/16  Yes [provider]  halobetasol (ULTRAVATE) 0.05 % cream Apply 1 application topically 2 (two) times daily as needed (for rash).    Yes [provider]  Insulin Detemir (LEVEMIR) 100 UNIT/ML Pen Inject 17-25 Units into the skin See admin instructions. Inject 25 units SQ in the morning and inject 17 units SQ at night   Yes [provider]  insulin lispro (HUMALOG) 100 UNIT/ML injection Inject 7 Units into the skin 2 (two) times daily with breakfast and lunch.    Yes [provider]  insulin lispro (HUMALOG) 100 UNIT/ML injection Inject 9 Units into the skin at bedtime.   Yes [provider]  levETIRAcetam (KEPPRA) 500 MG tablet Take 1 tablet (500 mg total) by mouth 2 (two) times daily. 10/29/19  Yes Lomax, Amy, NP  losartan (COZAAR) 50 MG tablet Take 50 mg by mouth at bedtime.    Yes [provider]  metFORMIN (GLUCOPHAGE-XR) 500 MG 24 hr tablet Take 500 mg by mouth 2 (two) times daily. 02/02/17  Yes [provider]  Multiple Vitamin (MULTIVITAMIN WITH MINERALS) TABS tablet Take 1 tablet by mouth at bedtime.   Yes [provider]  Omega-3 Fatty Acids (FISH OIL) 1200 MG CAPS Take 2,400 mg by mouth 2 (two) times daily.   Yes [provider]  omeprazole (PRILOSEC) 20 MG capsule Take 20 mg by mouth at bedtime.    Yes [provider]  pioglitazone (ACTOS) 30 MG tablet Take 30 mg by mouth at bedtime. 02/23/17  Yes [provider]  Polyethyl Glycol-Propyl Glycol (SYSTANE) 0.4-0.3 % SOLN Place 1  drop into both eyes 2 (two) times daily.    Yes [provider]  risedronate (ACTONEL) 150 MG tablet Take 150 mg by mouth every 30 (thirty) days. 03/02/16  Yes [provider]  sertraline (ZOLOFT) 100 MG tablet Take 100 mg by mouth at bedtime. 02/02/17  Yes [provider]  TRIJARDY XR 12.5-2.02-999 MG TB24 Take 1 tablet by mouth 2 (two) times daily. 02/17/20   Yes [provider]  Fluticasone-Umeclidin-Vilant (TRELEGY ELLIPTA) 200-62.5-25 MCG/INH AEPB Inhale 1 puff into the lungs daily. 03/07/20   Deneise Lever, MD  nitroGLYCERIN (NITROSTAT) 0.4 MG SL tablet Place 1 tablet (0.4 mg total) under the tongue every 5 (five) minutes as needed for chest pain. 03/21/17 06/07/18  Minus Breeding, MD   Past Medical History:  Diagnosis Date  . CAD (coronary artery disease)    Cypher stent 09/2002  . Cancer [facility]) 2018   left breast, radiation therapy  . CHF (congestive heart failure) ([place])   . Depression   . Diabetes mellitus ([place])   . Hyperlipidemia   . Hypertension   . Myocardial infarction ([place])    2003,went into cardiac shock  . Osteoporosis   . Seizures ([place])    1st seizure age late 9's; most recent 07/28/17  . Sinusitis    Past Surgical History:  Procedure Laterality Date  . ABDOMINAL HYSTERECTOMY    . APPENDECTOMY    . BREAST LUMPECTOMY WITH RADIOACTIVE SEED AND SENTINEL LYMPH NODE BIOPSY Left 03/16/2017   Procedure: INJECT BLUE DYE LEFT BREAST, LEFT BREAST LUMPECTOMY WITH RADIOACTIVE SEED AND LEFT AXILLARY DEEP SENTINEL LYMPH NODE  BIOPSY ADJACENT TISSUE TRANSFER;  Surgeon: [name], MD;  Location: [place];  Service: General;  Laterality: Left;  . CARPAL TUNNEL RELEASE Bilateral   . COLONOSCOPY WITH PROPOFOL N/A 05/30/2015   Procedure: COLONOSCOPY WITH PROPOFOL;  Surgeon: Carol Ada, MD;  [facility];  Service: Endoscopy;  Laterality: N/A;  . COLONOSCOPY WITH PROPOFOL N/A 06/09/2018   Procedure: COLONOSCOPY WITH PROPOFOL;  Surgeon: Carol Ada, MD;  [facility];  Service: Endoscopy;  Laterality: N/A;  . CORONARY ANGIOPLASTY WITH STENT PLACEMENT  10-02-2002  . Exploratory abdominal     Bleeding in abdomen after tubal  . POLYPECTOMY  06/09/2018   Procedure: POLYPECTOMY;  Surgeon: Carol Ada, MD;  [facility];  Service: Endoscopy;;  . TUBAL LIGATION    . UMBILICAL HERNIA REPAIR     Family  History  Problem Relation Age of Onset  . Dementia Mother   . Kidney disease Mother   . Heart attack Father 88       Died age 74  . Heart attack Brother 29  . Heart attack Brother 58   Social History   Socioeconomic History  . Marital status: Married    Spouse name: Sonia Side  . Number of children: 3  . Years of education: 102  . Highest education level: Not on file  Occupational History  . Not on file  Tobacco Use  . Smoking status: Current Every Day Smoker    Packs/day: 2.00    Years: 30.00    Pack years: 60.00    Types: Cigarettes  . Smokeless tobacco: Never Used  . Tobacco comment: 10 per day   Substance and Sexual Activity  . Alcohol use: No    Alcohol/week: 0.0 standard drinks  . Drug use: No  . Sexual activity: Not on file  Other Topics Concern  . Not on file  Social History Narrative  Lives with husband.    Caffeine-    Social Determinants of Health   Financial Resource Strain:   . Difficulty of Paying Living Expenses:   Food Insecurity:   . Worried About Charity fundraiser in the Last Year:   . Arboriculturist in the Last Year:   Transportation Needs:   . Film/video editor (Medical):   Marland Kitchen Lack of Transportation (Non-Medical):   Physical Activity:   . Days of Exercise per Week:   . Minutes of Exercise per Session:   Stress:   . Feeling of Stress :   Social Connections:   . Frequency of Communication with Friends and Family:   . Frequency of Social Gatherings with Friends and Family:   . Attends Religious Services:   . Active Member of Clubs or Organizations:   . Attends Archivist Meetings:   Marland Kitchen Marital Status:   Intimate Partner Violence:   . Fear of Current or Ex-Partner:   . Emotionally Abused:   Marland Kitchen Physically Abused:   . Sexually Abused:    ROS-see HPI   + = positive Constitutional:    weight loss, night sweats, fevers, chills, fatigue, lassitude. HEENT:    headaches, difficulty swallowing, tooth/dental problems, sore throat,        sneezing, itching, ear ache, nasal congestion, post nasal drip, snoring CV:    chest pain, orthopnea, PND, swelling in lower extremities, anasarca,                                  dizziness, palpitations Resp:   shortness of breath with exertion or at rest.                productive cough,   non-productive cough, coughing up of blood.              change in color of mucus.  wheezing.   Skin:    rash or lesions. GI:  No-   heartburn, indigestion, abdominal pain, nausea, vomiting, diarrhea,                 change in bowel habits, loss of appetite GU: dysuria, change in color of urine, no urgency or frequency.   flank pain. MS:   joint pain, stiffness, decreased range of motion, back pain. Neuro-     nothing unusual Psych:  change in mood or affect.  depression or +anxiety.   memory loss.  OBJ- Physical Exam General- Alert, Oriented, Affect-appropriate, Distress- none acute, slender Skin- rash-none, lesions- none, excoriation- none Lymphadenopathy- none Head- atraumatic            Eyes- Gross vision intact, PERRLA, conjunctivae and secretions clear            Ears- Hearing, canals-normal            Nose- Clear, no-Septal dev, mucus, polyps, erosion, perforation             Throat- Mallampati II-III , mucosa clear , drainage- none, tonsils- atrophic, +edentulous/ dentures Neck- flexible , trachea midline, no stridor , thyroid nl, carotid no bruit Chest - symmetrical excursion , unlabored           Heart/CV- RRR , no murmur , no gallop  , no rub, nl s1 s2                           -  JVD- none , edema- none, stasis changes- none, varices- none           Lung- +few basilar crackles, wheeze- none, cough- none , dullness-none, rub- none           Chest wall-  Abd-  Br/ Gen/ Rectal- Not done, not indicated Extrem- cyanosis- none, clubbing, none, atrophy- none, strength- nl Neuro- grossly intact to observation

## 2020-03-09 ENCOUNTER — Encounter: Payer: Self-pay | Admitting: Internal Medicine

## 2020-03-09 DIAGNOSIS — J449 Chronic obstructive pulmonary disease, unspecified: Secondary | ICD-10-CM | POA: Insufficient documentation

## 2020-03-09 DIAGNOSIS — G479 Sleep disorder, unspecified: Secondary | ICD-10-CM | POA: Insufficient documentation

## 2020-03-09 NOTE — Assessment & Plan Note (Signed)
Encouraged her to stop. She declined my offer to refer her to pharmacy smoking cessation counseling. Plan- continue CT screening program.

## 2020-03-09 NOTE — Assessment & Plan Note (Signed)
We will try to help, but she needs to stop smoking before this gets worse. Plan- PFT, trial sample Breztri ot Trelegy as available

## 2020-03-09 NOTE — Assessment & Plan Note (Signed)
Not clear that she has a sleep disorder or nocturnal hypoxemia to cause daytime somnolence or polycythemia. We discussed sleep habits, testing, potential treatments for sleep disordered breathing.  Plan- HST

## 2020-03-17 ENCOUNTER — Telehealth: Payer: Self-pay | Admitting: Internal Medicine

## 2020-03-17 MED ORDER — TRELEGY ELLIPTA 100-62.5-25 MCG/INH IN AEPB
INHALATION_SPRAY | RESPIRATORY_TRACT | 12 refills | Status: DC
Start: 2020-03-17 — End: 2021-03-20

## 2020-03-17 NOTE — Telephone Encounter (Signed)
Thanks yes- please order Trelegy 100, # 60  inhale 1 puff then rinse mouth, once daily, ref x 12

## 2020-03-17 NOTE — Telephone Encounter (Signed)
Spoke with pt and she reports that at ov on 03/07/20 she was given a sample for Trelegy because we didn't have Anoro at the time. She states that she thinks it is helping some . She reports that she is able to cough of mucus after using Trelegy ane then the cough stops. Please advise if ok to call in rx for Trelegy.

## 2020-03-17 NOTE — Telephone Encounter (Signed)
Spoke with pt and advised that refill for Trelegy was sent to pharmacy. Pt verbalized understanding. Nothing further needed.

## 2020-04-09 DIAGNOSIS — R9431 Abnormal electrocardiogram [ECG] [EKG]: Secondary | ICD-10-CM | POA: Insufficient documentation

## 2020-04-09 DIAGNOSIS — Z7189 Other specified counseling: Secondary | ICD-10-CM | POA: Insufficient documentation

## 2020-04-09 NOTE — Progress Notes (Signed)
Cardiology Office Note   Date:  04/10/2020   ID:  Monica [name], DOB 09-Mar-1946, MRN [id number]  PCP:  Jani Gravel, MD  Cardiologist:   Minus Breeding, MD    Chief Complaint  Patient presents with   Follow-up    12 month      History of Present Illness: Monica [name] is a 74 y.o. female who presents for evaluation of coronary artery disease. She had previous stenting to an LAD. This was in 2003 with DES.  In 2016  POET (Plain Old Exercise Treadmill) was negative.  Since I last saw her she has had no new cardiovascular complaints.  She does some walking.  She takes the garbage cans down to the road.  She might get a little short of breath with significant activity but not different than previous. The patient denies any new symptoms such as chest discomfort, neck or arm discomfort. There has been no new shortness of breath, PND or orthopnea. There have been no reported palpitations, presyncope or syncope.    Past Medical History:  Diagnosis Date   CAD (coronary artery disease)    Cypher stent 09/2002   Cancer ([place]) 2018   left breast, radiation therapy   CHF (congestive heart failure) ([place])    Depression    Diabetes mellitus ([place])    Hyperlipidemia    Hypertension    Myocardial infarction ([place])    2003,went into cardiac shock   Osteoporosis    Seizures ([place])    1st seizure age late 6's; most recent 07/28/17   Sinusitis     Past Surgical History:  Procedure Laterality Date   ABDOMINAL HYSTERECTOMY     APPENDECTOMY     BREAST LUMPECTOMY WITH RADIOACTIVE SEED AND SENTINEL LYMPH NODE BIOPSY Left 03/16/2017   Procedure: INJECT BLUE DYE LEFT BREAST, LEFT BREAST LUMPECTOMY WITH RADIOACTIVE SEED AND LEFT AXILLARY DEEP SENTINEL LYMPH NODE  BIOPSY ADJACENT TISSUE TRANSFER;  Surgeon: [name], MD;  Location: [facility];  Service: General;  Laterality: Left;   CARPAL TUNNEL RELEASE Bilateral    COLONOSCOPY WITH PROPOFOL N/A 05/30/2015   Procedure:  COLONOSCOPY WITH PROPOFOL;  Surgeon: Carol Ada, MD;  [facility];  Service: Endoscopy;  Laterality: N/A;   COLONOSCOPY WITH PROPOFOL N/A 06/09/2018   Procedure: COLONOSCOPY WITH PROPOFOL;  Surgeon: Carol Ada, MD;  [facility];  Service: Endoscopy;  Laterality: N/A;   CORONARY ANGIOPLASTY WITH STENT PLACEMENT  10-02-2002   Exploratory abdominal     Bleeding in abdomen after tubal   POLYPECTOMY  06/09/2018   Procedure: POLYPECTOMY;  Surgeon: Carol Ada, MD;  [facility];  Service: Endoscopy;;   TUBAL LIGATION     UMBILICAL HERNIA REPAIR       Current Outpatient Medications  Medication Sig Dispense Refill   acetaminophen (TYLENOL) 500 MG tablet Take 1,000 mg by mouth every 6 (six) hours as needed for moderate pain or headache.     anastrozole (ARIMIDEX) 1 MG tablet Take 1 tablet by mouth once daily 90 tablet 0   aspirin EC 81 MG tablet Take 81 mg by mouth at bedtime.     atorvastatin (LIPITOR) 40 MG tablet Take 40 mg by mouth at bedtime.     Calcium Carbonate-Vitamin D 600-400 MG-UNIT per tablet Take 1 tablet by mouth daily.      carvedilol (COREG) 3.125 MG tablet Take 3.125 mg by mouth 2 (two) times daily.      clonazePAM (KLONOPIN) 1 MG tablet Take 1  mg by mouth at bedtime.      Fluticasone-Umeclidin-Vilant (TRELEGY ELLIPTA) 100-62.5-25 MCG/INH AEPB Inhale 1 puff then rinse mouth, once daily 60 each 12   glimepiride (AMARYL) 4 MG tablet Take 4 mg by mouth 2 (two) times daily.     halobetasol (ULTRAVATE) 0.05 % cream Apply 1 application topically 2 (two) times daily as needed (for rash).      Insulin Detemir (LEVEMIR) 100 UNIT/ML Pen Inject 17-25 Units into the skin See admin instructions. Inject 25 units SQ in the morning and inject 17 units SQ at night     insulin lispro (HUMALOG) 100 UNIT/ML injection Inject 7 Units into the skin 2 (two) times daily with breakfast and lunch.      insulin lispro (HUMALOG) 100 UNIT/ML injection  Inject 9 Units into the skin at bedtime.     levETIRAcetam (KEPPRA) 500 MG tablet Take 1 tablet (500 mg total) by mouth 2 (two) times daily. 180 tablet 3   losartan (COZAAR) 50 MG tablet Take 50 mg by mouth at bedtime.      Multiple Vitamin (MULTIVITAMIN WITH MINERALS) TABS tablet Take 1 tablet by mouth at bedtime.     Omega-3 Fatty Acids (FISH OIL) 1200 MG CAPS Take 2,400 mg by mouth 2 (two) times daily.     omeprazole (PRILOSEC) 20 MG capsule Take 20 mg by mouth at bedtime.      pioglitazone (ACTOS) 30 MG tablet Take 30 mg by mouth at bedtime.     Polyethyl Glycol-Propyl Glycol (SYSTANE) 0.4-0.3 % SOLN Place 1 drop into both eyes 2 (two) times daily.      risedronate (ACTONEL) 150 MG tablet Take 150 mg by mouth every 30 (thirty) days.     sertraline (ZOLOFT) 100 MG tablet Take 100 mg by mouth at bedtime.     TRIJARDY XR 12.5-2.02-999 MG TB24 Take 1 tablet by mouth 2 (two) times daily.     nitroGLYCERIN (NITROSTAT) 0.4 MG SL tablet Place 1 tablet (0.4 mg total) under the tongue every 5 (five) minutes as needed for chest pain. 25 tablet 1   No current facility-administered medications for this visit.    Allergies:   Ace inhibitors, Keflex [cephalexin], Phenobarbital, Sulfate, and Altace [ramipril]    ROS:  Please see the history of present illness.   Otherwise, review of systems are positive for recent abdominal distention.   All other systems are reviewed and negative.    PHYSICAL EXAM: VS:  BP 120/72 (BP Location: Right Arm, Patient Position: Sitting, Cuff Size: Normal)    Pulse 62    Ht 5\' 5"  (1.651 m)    Wt 117 lb (53.1 kg)    BMI 19.47 kg/m  , BMI Body mass index is 19.47 kg/m.  GENERAL:  Well appearing NECK:  No jugular venous distention, waveform within normal limits, carotid upstroke brisk and symmetric, no bruits, no thyromegaly LUNGS:  Clear to auscultation bilaterally CHEST:  Unremarkable HEART:  PMI not displaced or sustained,S1 and S2 within normal limits, no S3,  no S4, no clicks, no rubs, no murmurs ABD:  Flat, positive bowel sounds normal in frequency in pitch, no bruits, no rebound, no guarding, no midline pulsatile mass, no hepatomegaly, no splenomegaly EXT:  2 plus pulses throughout, no edema, no cyanosis no clubbing   EKG:  EKG is   ordered today. Sinus rhythm, rate 52, right bundle branch block, no acute ST-T wave changes.  No change from previous.  QT is prolonged  Recent Labs:  Wt Readings from Last 3 Encounters:  04/10/20 117 lb (53.1 kg)  03/07/20 117 lb 9.6 oz (53.3 kg)  12/26/19 121 lb 11.2 oz (55.2 kg)      Other studies Reviewed: Additional studies/ records that were reviewed today include: None Review of the above records demonstrates:  NA   ASSESSMENT AND PLAN:  CAD:   The patient has no new sypmtoms.  No further cardiovascular testing is indicated.  We will continue with aggressive risk reduction and meds as listed.  DYSLIPIDEMIA:    LDL was 17.  No change in therapy.   TOBACCO:   She is smoking.  We talked about the need to stop smoking.   DM:    A1c was 6.8.  No change in therapy.  PROLONGED QT:    He has prolonged she had no symptoms related to this.  She should avoid QT prolonging drugs.   COVID EDUCATION: She has had vaccine.  Current medicines are reviewed at length with the patient today.  The patient does not have concerns regarding medicines.  The following changes have been made:  None  Labs/ tests ordered today include:  None  Orders Placed This Encounter  Procedures   EKG 12-Lead     Disposition:   FU with me in one year.     Signed, Minus Breeding, MD  04/10/2020 12:04 PM    [facility]

## 2020-04-10 ENCOUNTER — Encounter: Payer: Self-pay | Admitting: Cardiology

## 2020-04-10 ENCOUNTER — Ambulatory Visit: Payer: Medicare Other | Admitting: Cardiology

## 2020-04-10 ENCOUNTER — Other Ambulatory Visit: Payer: Self-pay

## 2020-04-10 VITALS — BP 120/72 | HR 62 | Ht 65.0 in | Wt 117.0 lb

## 2020-04-10 DIAGNOSIS — Z7189 Other specified counseling: Secondary | ICD-10-CM

## 2020-04-10 DIAGNOSIS — I2510 Atherosclerotic heart disease of native coronary artery without angina pectoris: Secondary | ICD-10-CM | POA: Diagnosis not present

## 2020-04-10 DIAGNOSIS — R9431 Abnormal electrocardiogram [ECG] [EKG]: Secondary | ICD-10-CM

## 2020-04-10 DIAGNOSIS — E118 Type 2 diabetes mellitus with unspecified complications: Secondary | ICD-10-CM

## 2020-04-10 DIAGNOSIS — E785 Hyperlipidemia, unspecified: Secondary | ICD-10-CM | POA: Diagnosis not present

## 2020-04-10 DIAGNOSIS — Z720 Tobacco use: Secondary | ICD-10-CM | POA: Diagnosis not present

## 2020-04-10 NOTE — Patient Instructions (Signed)
Medication Instructions:  CONTINUE WITH CURRENT MEDICATIONS. NO CHANGES.  *If you need a refill on your cardiac medications before your next appointment, please call your pharmacy*    Follow-Up: At [facility], you and your health needs are our priority.  As part of our continuing mission to provide you with exceptional heart care, we have created designated Provider Care Teams.  These Care Teams include your primary Cardiologist (physician) and Advanced Practice Providers (APPs -  Physician Assistants and Nurse Practitioners) who all work together to provide you with the care you need, when you need it.  We recommend signing up for the patient portal called "MyChart".  Sign up information is provided on this After Visit Summary.  MyChart is used to connect with patients for Virtual Visits (Telemedicine).  Patients are able to view lab/test results, encounter notes, upcoming appointments, etc.  Non-urgent messages can be sent to your provider as well.   To learn more about what you can do with MyChart, go to [email or web address].    Your next appointment:   12 month(s)  The format for your next appointment:   In Person  Provider:   Minus Breeding, MD

## 2020-04-11 ENCOUNTER — Ambulatory Visit (HOSPITAL_COMMUNITY)
Admission: RE | Admit: 2020-04-11 | Discharge: 2020-04-11 | Disposition: A | Discharge status: Home / Self Care | Payer: Medicare Other | Source: Ambulatory Visit | Attending: Hematology | Admitting: Hematology

## 2020-04-11 DIAGNOSIS — D751 Secondary polycythemia: Secondary | ICD-10-CM | POA: Diagnosis present

## 2020-04-15 ENCOUNTER — Other Ambulatory Visit (HOSPITAL_COMMUNITY)
Admission: RE | Admit: 2020-04-15 | Discharge: 2020-04-15 | Disposition: A | Discharge status: Home / Self Care | Payer: Medicare Other | Source: Ambulatory Visit | Attending: Internal Medicine | Admitting: Internal Medicine

## 2020-04-15 ENCOUNTER — Telehealth: Payer: Self-pay | Admitting: Hematology

## 2020-04-15 DIAGNOSIS — Z01812 Encounter for preprocedural laboratory examination: Secondary | ICD-10-CM | POA: Insufficient documentation

## 2020-04-15 DIAGNOSIS — Z20822 Contact with and (suspected) exposure to covid-19: Secondary | ICD-10-CM | POA: Diagnosis not present

## 2020-04-15 LAB — SARS CORONAVIRUS 2 (TAT 6-24 HRS): SARS Coronavirus 2: NEGATIVE

## 2020-04-15 NOTE — Telephone Encounter (Signed)
Scheduled appt per 7/6 sch msg - unable to reach pt. Left message with appt date and time

## 2020-04-16 ENCOUNTER — Inpatient Hospital Stay: Payer: Medicare Other | Attending: Hematology | Admitting: Hematology

## 2020-04-16 ENCOUNTER — Encounter: Payer: Self-pay | Admitting: Hematology

## 2020-04-16 ENCOUNTER — Other Ambulatory Visit: Payer: Self-pay

## 2020-04-16 DIAGNOSIS — K760 Fatty (change of) liver, not elsewhere classified: Secondary | ICD-10-CM | POA: Insufficient documentation

## 2020-04-16 DIAGNOSIS — Z170 Estrogen receptor positive status [ER+]: Secondary | ICD-10-CM | POA: Insufficient documentation

## 2020-04-16 DIAGNOSIS — C50212 Malignant neoplasm of upper-inner quadrant of left female breast: Secondary | ICD-10-CM | POA: Insufficient documentation

## 2020-04-16 DIAGNOSIS — J449 Chronic obstructive pulmonary disease, unspecified: Secondary | ICD-10-CM

## 2020-04-16 DIAGNOSIS — Z79811 Long term (current) use of aromatase inhibitors: Secondary | ICD-10-CM | POA: Insufficient documentation

## 2020-04-16 DIAGNOSIS — E119 Type 2 diabetes mellitus without complications: Secondary | ICD-10-CM | POA: Insufficient documentation

## 2020-04-16 DIAGNOSIS — D696 Thrombocytopenia, unspecified: Secondary | ICD-10-CM | POA: Insufficient documentation

## 2020-04-16 DIAGNOSIS — D751 Secondary polycythemia: Secondary | ICD-10-CM | POA: Diagnosis not present

## 2020-04-16 DIAGNOSIS — E611 Iron deficiency: Secondary | ICD-10-CM | POA: Insufficient documentation

## 2020-04-16 DIAGNOSIS — M85852 Other specified disorders of bone density and structure, left thigh: Secondary | ICD-10-CM | POA: Insufficient documentation

## 2020-04-16 DIAGNOSIS — F17210 Nicotine dependence, cigarettes, uncomplicated: Secondary | ICD-10-CM | POA: Insufficient documentation

## 2020-04-16 DIAGNOSIS — R161 Splenomegaly, not elsewhere classified: Secondary | ICD-10-CM | POA: Insufficient documentation

## 2020-04-16 NOTE — Progress Notes (Signed)
[place]   [phone] [phone]   Clinic Follow up Note   Patient Care Team: Jani Gravel, MD as PCP - General (Internal Medicine) [name], MD as Consulting Physician (General Surgery) Truitt Merle, MD as Consulting Physician (Hematology) Kyung Rudd, MD as Consulting Physician (Radiation Oncology) Delice Bison Charlestine Massed, NP as Nurse Practitioner (Hematology and Oncology)   I connected with Monica [name] on 04/16/2020 at  4:00 PM EDT by telephone visit and verified that I am speaking with the correct person using two identifiers.  I discussed the limitations, risks, security and privacy concerns of performing an evaluation and management service by telephone and the availability of in person appointments. I also discussed with the patient that there may be a patient responsible charge related to this service. The patient expressed understanding and agreed to proceed.   Other persons participating in the visit and their role in the encounter:  None   Patient's location:  Her home  Provider's location:  My Office   CHIEF COMPLAINT: Follow up left Breast cancer  SUMMARY OF ONCOLOGIC HISTORY: Oncology History Overview Note  Cancer Staging Malignant neoplasm of upper-inner quadrant of left breast in female, estrogen receptor positive ([place]) Staging form: Breast, AJCC 8th Edition - Clinical stage from 02/16/2017: Stage IA (cT1b, cN0, cM0, G2, ER: Positive, PR: Positive, HER2: Negative) - Unsigned Staging comments: Staged at breast conference  - Pathologic stage from 03/16/2017: Stage IA (pT1b, pN0, cM0, G2, ER: Positive, PR: Positive, HER2: Negative, Oncotype DX score: 27) - Signed by Truitt Merle, MD on 04/24/2017     Malignant neoplasm of upper-inner quadrant of left breast in female, estrogen receptor positive ([place])  02/09/2017 Initial Diagnosis   Malignant neoplasm of upper-inner quadrant of left breast in female, estrogen receptor positive  ([place])   02/09/2017 Initial Biopsy   Diagnosis Breast, left, needle core biopsy - INVASIVE DUCTAL CARCINOMA, 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 Dalbert Batman    03/16/2017 Pathology Results   PATHOLOGY REPORT Diagnosis 03/16/17 1. Breast, lumpectomy, Left - INVASIVE DUCTAL CARCINOMA, NOTTINGHAM GRADE 2 OF 3, 0.9 CM - DUCTAL CARCINOMA IN SITU - MARGINS UNINVOLVED BY CARCINOMA (0.3 CM POSTERIOR MARGIN) - PREVIOUS BIOPSY SITE CHANGES - SEE ONCOLOGY TABLE BELOW 2. Lymph node, sentinel, biopsy, Left axillary #1 - NO CARCINOMA IDENTIFIED IN ONE LYMPH NODE (0/1) 3. Lymph node, sentinel, biopsy, Left axillary #2 - NO CARCINOMA IDENTIFIED IN ONE LYMPH NODE (0/1) 4. Lymph node, sentinel, biopsy, Left axillary #3 - NO CARCINOMA IDENTIFIED IN ONE LYMPH NODE (0/1) 5. Lymph node, sentinel, biopsy, Left axillary #4 - NO CARCINOMA IDENTIFIED IN ONE LYMPH NODE (0/1)    03/16/2017 Oncotype testing   Recurrance score of 27 with a 18% distance recurrance in the net 10 years with Tamoxifen alone    05/19/2017 - 06/16/2017 Radiation Therapy   Radiation with Dr. Lisbeth Renshaw   06/2017 -  Anti-estrogen oral therapy   anastrozole 1 mg once a day starting late 06/2017       CURRENT THERAPY:  anastrozole 1 mg once a day starting 06/2017  INTERVAL HISTORY:  Monica [name] is here for a follow up of left breast cancer. She notes she  is doing well. She notes she is open to bone marrow biopsy.    REVIEW OF SYSTEMS:   Constitutional: Denies fevers, chills or abnormal weight loss Eyes: Denies blurriness of vision Ears, nose, mouth, throat, and face: Denies mucositis or sore  throat Respiratory: Denies cough, dyspnea or wheezes Cardiovascular: Denies palpitation, chest discomfort or lower extremity swelling Gastrointestinal:  Denies nausea, heartburn or change in bowel habits Skin: Denies abnormal skin rashes Lymphatics: Denies new lymphadenopathy or easy bruising Neurological:Denies numbness, tingling or new weaknesses Behavioral/Psych: Mood is stable, no new changes  All other systems were reviewed with the patient and are negative.  MEDICAL HISTORY:  Past Medical History:  Diagnosis Date  . CAD (coronary artery disease)    Cypher stent 09/2002  . Cancer [facility]) 2018   left breast, radiation therapy  . CHF (congestive heart failure) ([place])   . Depression   . Diabetes mellitus ([place])   . Hyperlipidemia   . Hypertension   . Myocardial infarction ([place])    2003,went into cardiac shock  . Osteoporosis   . Seizures ([place])    1st seizure age late 50's; most recent 07/28/17  . Sinusitis     SURGICAL HISTORY: Past Surgical History:  Procedure Laterality Date  . ABDOMINAL HYSTERECTOMY    . APPENDECTOMY    . BREAST LUMPECTOMY WITH RADIOACTIVE SEED AND SENTINEL LYMPH NODE BIOPSY Left 03/16/2017   Procedure: INJECT BLUE DYE LEFT BREAST, LEFT BREAST LUMPECTOMY WITH RADIOACTIVE SEED AND LEFT AXILLARY DEEP SENTINEL LYMPH NODE  BIOPSY ADJACENT TISSUE TRANSFER;  Surgeon: [name], MD;  Location: [facility];  Service: General;  Laterality: Left;  . CARPAL TUNNEL RELEASE Bilateral   . COLONOSCOPY WITH PROPOFOL N/A 05/30/2015   Procedure: COLONOSCOPY WITH PROPOFOL;  Surgeon: Carol Ada, MD;  Location: [facility];  Service: Endoscopy;  Laterality: N/A;  . COLONOSCOPY WITH PROPOFOL N/A 06/09/2018   Procedure: COLONOSCOPY WITH PROPOFOL;  Surgeon: Carol Ada, MD;  Location: [facility];  Service: Endoscopy;  Laterality: N/A;  . CORONARY ANGIOPLASTY WITH STENT PLACEMENT  10-02-2002  . Exploratory abdominal     Bleeding in abdomen after tubal  . POLYPECTOMY   06/09/2018   Procedure: POLYPECTOMY;  Surgeon: Carol Ada, MD;  Location: [facility];  Service: Endoscopy;;  . TUBAL LIGATION    . UMBILICAL HERNIA REPAIR      I have reviewed the social history and family history with the patient and they are unchanged from previous note.  ALLERGIES:  is allergic to ace inhibitors, keflex [cephalexin], phenobarbital, sulfate, and altace [ramipril].  MEDICATIONS:  Current Outpatient Medications  Medication Sig Dispense Refill  . acetaminophen (TYLENOL) 500 MG tablet Take 1,000 mg by mouth every 6 (six) hours as needed for moderate pain or headache.    . anastrozole (ARIMIDEX) 1 MG tablet Take 1 tablet by mouth once daily 90 tablet 0  . aspirin EC 81 MG tablet Take 81 mg by mouth at bedtime.    Marland Kitchen atorvastatin (LIPITOR) 40 MG tablet Take 40 mg by mouth at bedtime.    . Calcium Carbonate-Vitamin D 600-400 MG-UNIT per tablet Take 1 tablet by mouth daily.     . carvedilol (COREG) 3.125 MG tablet Take 3.125 mg by mouth 2 (two) times daily.     . clonazePAM (KLONOPIN) 1 MG tablet Take 1 mg by mouth at bedtime.     . Fluticasone-Umeclidin-Vilant (TRELEGY ELLIPTA) 100-62.5-25 MCG/INH AEPB Inhale 1 puff then rinse mouth, once daily 60 each 12  . glimepiride (AMARYL) 4 MG  tablet Take 4 mg by mouth 2 (two) times daily.    . halobetasol (ULTRAVATE) 0.05 % cream Apply 1 application topically 2 (two) times daily as needed (for rash).     . Insulin Detemir (LEVEMIR) 100 UNIT/ML Pen Inject 17-25 Units into the skin See admin instructions. Inject 25 units SQ in the morning and inject 17 units SQ at night    . insulin lispro (HUMALOG) 100 UNIT/ML injection Inject 7 Units into the skin 2 (two) times daily with breakfast and lunch.     . insulin lispro (HUMALOG) 100 UNIT/ML injection Inject 9 Units into the skin at bedtime.    . levETIRAcetam (KEPPRA) 500 MG tablet Take 1 tablet (500 mg total) by mouth 2 (two) times daily. 180 tablet 3  . losartan (COZAAR) 50 MG tablet  Take 50 mg by mouth at bedtime.     . Multiple Vitamin (MULTIVITAMIN WITH MINERALS) TABS tablet Take 1 tablet by mouth at bedtime.    . nitroGLYCERIN (NITROSTAT) 0.4 MG SL tablet Place 1 tablet (0.4 mg total) under the tongue every 5 (five) minutes as needed for chest pain. 25 tablet 1  . Omega-3 Fatty Acids (FISH OIL) 1200 MG CAPS Take 2,400 mg by mouth 2 (two) times daily.    Marland Kitchen omeprazole (PRILOSEC) 20 MG capsule Take 20 mg by mouth at bedtime.     . pioglitazone (ACTOS) 30 MG tablet Take 30 mg by mouth at bedtime.    Vladimir Faster Glycol-Propyl Glycol (SYSTANE) 0.4-0.3 % SOLN Place 1 drop into both eyes 2 (two) times daily.     . risedronate (ACTONEL) 150 MG tablet Take 150 mg by mouth every 30 (thirty) days.    Marland Kitchen sertraline (ZOLOFT) 100 MG tablet Take 100 mg by mouth at bedtime.    . TRIJARDY XR 12.5-2.02-999 MG TB24 Take 1 tablet by mouth 2 (two) times daily.     No current facility-administered medications for this visit.    PHYSICAL EXAMINATION: ECOG PERFORMANCE STATUS: 0 - Asymptomatic  No vitals taken today, Exam not performed today   LABORATORY DATA:  I have reviewed the data as listed CBC Latest Ref Rng & Units 02/12/2020 02/12/2020 06/27/2019  WBC 4.0 - 10.5 K/uL 7.1 - 6.1  Hemoglobin 12.0 - 15.0 g/dL 16.6(H) - 16.3(H)  Hematocrit 34.0 - 46.6 % 51.9(H) 51.6(H) 50.8(H)  Platelets 150 - 400 K/uL 126(L) - 105(L)     CMP Latest Ref Rng & Units 02/12/2020 12/26/2019 06/27/2019  Glucose 70 - 99 mg/dL 177(H) 281(H) 174(H)  BUN 8 - 23 mg/dL '14 21 12  '$ Creatinine 0.44 - 1.00 mg/dL 0.71 0.78 0.71  Sodium 135 - 145 mmol/L 141 136 139  Potassium 3.5 - 5.1 mmol/L 4.2 4.4 4.0  Chloride 98 - 111 mmol/L 106 103 102  CO2 22 - 32 mmol/L '23 24 29  '$ Calcium 8.9 - 10.3 mg/dL 9.0 9.5 9.8  Total Protein 6.5 - 8.1 g/dL 7.4 7.3 7.1  Total Bilirubin 0.3 - 1.2 mg/dL 0.4 0.5 0.5  Alkaline Phos 38 - 126 U/L 76 73 58  AST 15 - 41 U/L '20 18 26  '$ ALT 0 - 44 U/L '22 23 25      '$ RADIOGRAPHIC STUDIES: I  have personally reviewed the radiological images as listed and agreed with the findings in the report. No results found.   US Abdomen 04/11/20  IMPRESSION: Splenomegaly 12.5 x 13.1 x 7.8 cm for volume of 668 cc.  No focal lesion.  ASSESSMENT & PLAN:  Monica [name] is a 74 y.o. female with    1. Polycythemia  -She has developed erythrocytosis since 2019, with highest hematocritHCT around 51.6% and Hg 16.6.  Her recent erythropoietin was elevated at 22, support secondary polycythemia.  -Her MPN genomic testing panel from 12/2019 showed negativeJAK2, CALR, ABL1, KIT, etc, the only positive was TP53 mutation, which could be associated with myeloid neoplasm. Overall not definitve for a MPN diagnosis.  -Lab workup from 02/12/20 show normal B12 and retic panel. She still has with mildly elevated Erythropoietin, RBC, Hg and Hct, but has lower iron 58, ferritin 6, Sat ratio 14. She also had mild thrombocytopenia.  -I discussed her lab work up is more consistent with secondary polycythemia which is likely related to her heavy smoking history and COPD. She also has drowsiness during the day time concerning for sleep apnea. I previously referred to a pulmonologist for sleep study and establish care for COPD.   -I discussed her US abdomen from 04/11/20 shows splenomegaly 12.5x13x1c7.8cm. Scan also shows fatty liver and benign cystic liver lesions.  -I discussed with splenomegaly and polycythemia, I would like to rule out Polycythemia Vera and other MPN although her JAK2 mutation was negative. I recommend bone marrow biopsy for more definitive diagnosis.  I reviewed procedure of, and complications, such as infection, bleeding, pain, nerve damage, etc.  She is agreeable.  I will schedule it for the next few weeks. -F/u 1 week after biopsy.   2. Iron deficiency without anemia -Her recent labs showed evidence of iron deficiency and slight microcytosis -She denies bleeding of nose, gums, bloody or black stool. She  did have Diverticulosis and 3-4 polyps removed with her last colonoscopy in 05/2018 which can lead to bleeding.  -Given her lower iron I recommend she start OTC oral iron with Ferrous sulfate (02/2020). If her iron deficiency dose not improve on oral iron I may refer her to Dr. Benson Norway to repeat colonoscopy sooner.  3. 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 She is s/p left breastlumpectomyand adjuvant radiation.  -the Oncotype Dx resultshowedintermediate risk based on the recurrence score 27.Given her advanced age, comorbidities, strong ER and PR expression in her tumor, we decided not to pursue adjuvant chemotherapy. -She started antiestrogen therapy with Anastrozole in 06/2017. Shehas tolerated well with no issues so far. -She is clinically doing well. Continue surveillance. She is overdue for her 2021 mammogram.  -Continue Anastrozole   4. 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  -No history of liver disease or alcohol abuse. 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 -She does have splenomegaly on 04/11/20 US abdomen.   5. Osteopenia  -Continue calcium and vitamin D -Based on her DEXA from 06/30/17 she is osteopenic with a left femur neck T-Score of -2.4  -Wepreviouslydiscussed the potential negative impact on her bone density from anastrozole. We will follow-up her bone density scan every 2 years -She has started risedronate by her PCP, tolerating well, will continue. -Her latestDEXAwas to be in 2020. I will check with Dr. Maudie Mercury her PCP for report.  6. DM -She is on insulin and multiple oral diabetic medications -Follows up with Dr. Maudie Mercury her PCP  7. Depression and mood swing -She has become slightly more irritable since she started anastrozole, much  improved now -She is on Zoloft 100 mg daily,  mostly stable mood.   8. Smoking and lung nodule, Emphysema -She has heavy smoking history since in her late 40s, has cut cigarettes to half pack a day -She underwent low-dose CT scan in 02/2018 for lung cancer screening, which showed several small pulmonary nodules, largest in the right upper lobe 7 mm, likely benign, will follow-up. -Screening CT Chest from 12/27/19 was negative for malignancy but does show emphysema in her lungs. -Shecontinues trying to cut back on smoking.I encouraged her to continueuntil she quitscompletely.   PLAN: -US Abdomen reviewed  -Lab and Bone Marrow biopsy in 1-2 weeks at [facility] by NP Mendel Ryder  -Lab and F/u 1 week after biopsy.    No problem-specific Assessment & Plan notes found for this encounter.   No orders of the defined types were placed in this encounter.  I discussed the assessment and treatment plan with the patient. The patient was provided an opportunity to ask questions and all were answered. The patient agreed with the plan and demonstrated an understanding of the instructions.  The patient was advised to call back or seek an in-person evaluation if the symptoms worsen or if the condition fails to improve as anticipated.  The total time spent in the appointment was 25 minutes.    Truitt Merle, MD 04/16/2020   I, Joslyn Devon, am acting as scribe for Truitt Merle, MD.   I have reviewed the above documentation for accuracy and completeness, and I agree with the above.

## 2020-04-17 ENCOUNTER — Ambulatory Visit (INDEPENDENT_AMBULATORY_CARE_PROVIDER_SITE_OTHER): Payer: Medicare Other | Admitting: Internal Medicine

## 2020-04-17 ENCOUNTER — Other Ambulatory Visit: Payer: Self-pay

## 2020-04-17 DIAGNOSIS — J449 Chronic obstructive pulmonary disease, unspecified: Secondary | ICD-10-CM

## 2020-04-17 LAB — PULMONARY FUNCTION TEST
DL/VA % pred: 73 %
DL/VA: 3.01 ml/min/mmHg/L
DLCO cor % pred: 68 %
DLCO cor: 13.7 ml/min/mmHg
DLCO unc % pred: 68 %
DLCO unc: 13.7 ml/min/mmHg
FEF 25-75 Post: 1.27 L/sec
FEF 25-75 Pre: 1.34 L/sec
FEF2575-%Change-Post: -5 %
FEF2575-%Pred-Post: 69 %
FEF2575-%Pred-Pre: 74 %
FEV1-%Change-Post: -4 %
FEV1-%Pred-Post: 93 %
FEV1-%Pred-Pre: 98 %
FEV1-Post: 2.12 L
FEV1-Pre: 2.23 L
FEV1FVC-%Change-Post: -5 %
FEV1FVC-%Pred-Pre: 90 %
FEV6-%Change-Post: 1 %
FEV6-%Pred-Post: 112 %
FEV6-%Pred-Pre: 111 %
FEV6-Post: 3.23 L
FEV6-Pre: 3.2 L
FEV6FVC-%Change-Post: 0 %
FEV6FVC-%Pred-Post: 102 %
FEV6FVC-%Pred-Pre: 102 %
FVC-%Change-Post: 1 %
FVC-%Pred-Post: 110 %
FVC-%Pred-Pre: 108 %
FVC-Post: 3.32 L
FVC-Pre: 3.28 L
Post FEV1/FVC ratio: 64 %
Post FEV6/FVC ratio: 97 %
Pre FEV1/FVC ratio: 68 %
Pre FEV6/FVC Ratio: 97 %
RV % pred: 62 %
RV: 1.44 L
TLC % pred: 90 %
TLC: 4.71 L

## 2020-04-17 NOTE — Progress Notes (Signed)
PFT completed today.  

## 2020-04-21 ENCOUNTER — Telehealth: Payer: Self-pay | Admitting: Hematology

## 2020-04-21 ENCOUNTER — Other Ambulatory Visit: Payer: Self-pay | Admitting: *Deleted

## 2020-04-21 DIAGNOSIS — D751 Secondary polycythemia: Secondary | ICD-10-CM

## 2020-04-21 NOTE — Telephone Encounter (Signed)
Scheduled per 7/7 los. Pt is aware of appt time and date.

## 2020-04-29 ENCOUNTER — Other Ambulatory Visit: Payer: Self-pay

## 2020-04-29 ENCOUNTER — Inpatient Hospital Stay (HOSPITAL_BASED_OUTPATIENT_CLINIC_OR_DEPARTMENT_OTHER): Payer: Medicare Other | Admitting: Adult Health

## 2020-04-29 ENCOUNTER — Ambulatory Visit: Payer: Medicare Other | Admitting: Adult Health

## 2020-04-29 ENCOUNTER — Inpatient Hospital Stay: Payer: Medicare Other

## 2020-04-29 VITALS — BP 110/61 | HR 62 | Temp 98.0°F | Resp 16 | Ht 65.0 in | Wt 117.5 lb

## 2020-04-29 DIAGNOSIS — D751 Secondary polycythemia: Secondary | ICD-10-CM | POA: Diagnosis not present

## 2020-04-29 DIAGNOSIS — E611 Iron deficiency: Secondary | ICD-10-CM | POA: Diagnosis not present

## 2020-04-29 DIAGNOSIS — K760 Fatty (change of) liver, not elsewhere classified: Secondary | ICD-10-CM | POA: Diagnosis not present

## 2020-04-29 DIAGNOSIS — M85852 Other specified disorders of bone density and structure, left thigh: Secondary | ICD-10-CM | POA: Diagnosis not present

## 2020-04-29 DIAGNOSIS — D696 Thrombocytopenia, unspecified: Secondary | ICD-10-CM | POA: Diagnosis not present

## 2020-04-29 DIAGNOSIS — C50212 Malignant neoplasm of upper-inner quadrant of left female breast: Secondary | ICD-10-CM

## 2020-04-29 DIAGNOSIS — J449 Chronic obstructive pulmonary disease, unspecified: Secondary | ICD-10-CM | POA: Diagnosis not present

## 2020-04-29 DIAGNOSIS — Z79811 Long term (current) use of aromatase inhibitors: Secondary | ICD-10-CM | POA: Diagnosis not present

## 2020-04-29 DIAGNOSIS — R161 Splenomegaly, not elsewhere classified: Secondary | ICD-10-CM | POA: Diagnosis not present

## 2020-04-29 DIAGNOSIS — Z170 Estrogen receptor positive status [ER+]: Secondary | ICD-10-CM | POA: Diagnosis not present

## 2020-04-29 DIAGNOSIS — E119 Type 2 diabetes mellitus without complications: Secondary | ICD-10-CM | POA: Diagnosis not present

## 2020-04-29 DIAGNOSIS — F17210 Nicotine dependence, cigarettes, uncomplicated: Secondary | ICD-10-CM | POA: Diagnosis not present

## 2020-04-29 LAB — CBC WITH DIFFERENTIAL (CANCER CENTER ONLY)
Abs Immature Granulocytes: 0.02 10*3/uL (ref 0.00–0.07)
Basophils Absolute: 0 10*3/uL (ref 0.0–0.1)
Basophils Relative: 0 %
Eosinophils Absolute: 0.1 10*3/uL (ref 0.0–0.5)
Eosinophils Relative: 2 %
HCT: 49.3 % — ABNORMAL HIGH (ref 36.0–46.0)
Hemoglobin: 16.2 g/dL — ABNORMAL HIGH (ref 12.0–15.0)
Immature Granulocytes: 0 %
Lymphocytes Relative: 29 %
Lymphs Abs: 1.7 10*3/uL (ref 0.7–4.0)
MCH: 26 pg (ref 26.0–34.0)
MCHC: 32.9 g/dL (ref 30.0–36.0)
MCV: 79.1 fL — ABNORMAL LOW (ref 80.0–100.0)
Monocytes Absolute: 0.5 10*3/uL (ref 0.1–1.0)
Monocytes Relative: 8 %
Neutro Abs: 3.5 10*3/uL (ref 1.7–7.7)
Neutrophils Relative %: 61 %
Platelet Count: 107 10*3/uL — ABNORMAL LOW (ref 150–400)
RBC: 6.23 MIL/uL — ABNORMAL HIGH (ref 3.87–5.11)
RDW: 18 % — ABNORMAL HIGH (ref 11.5–15.5)
WBC Count: 5.8 10*3/uL (ref 4.0–10.5)
nRBC: 0 % (ref 0.0–0.2)

## 2020-04-29 NOTE — Progress Notes (Signed)
INDICATION: polycythemia, thrombocytopenia  Brief examination was performed. ENT: adequate airway clearance Heart: regular rate and rhythm.No Murmurs Lungs: clear to auscultation, no wheezes, normal respiratory effort  Bone Marrow Biopsy and Aspiration Procedure Note   Informed consent was obtained and potential risks including bleeding, infection and pain were reviewed with the patient.  The patient's name, date of birth, identification, consent and allergies were verified prior to the start of procedure and time out was performed.  The left posterior iliac crest was chosen as the site of biopsy.  The skin was prepped with ChloraPrep.   8 cc of 2% lidocaine was used to provide local anaesthesia.   10 cc of bone marrow aspirate was obtained followed by 1cm biopsy.  Pressure was applied to the biopsy site and bandage was placed over the biopsy site. Patient was made to lie on the back for 30 mins prior to discharge.  The procedure was tolerated well. COMPLICATIONS: None BLOOD LOSS: none The patient was discharged home in stable condition with a 1 week follow up to review results.  Patient was provided with post bone marrow biopsy instructions and instructed to call if there was any bleeding or worsening pain.  Specimens sent for flow cytometry, cytogenetics and additional studies.  Signed Scot Dock, NP

## 2020-04-29 NOTE — Patient Instructions (Signed)
Bone Marrow Aspiration and Bone Marrow Biopsy, Adult, Care After This sheet gives you information about how to care for yourself after your procedure. Your health care provider may also give you more specific instructions. If you have problems or questions, contact your health care provider. What can I expect after the procedure? After the procedure, it is common to have:  Mild pain and tenderness.  Swelling.  Bruising. Follow these instructions at home: Puncture site care   Follow instructions from your health care provider about how to take care of the puncture site. Make sure you: ? Wash your hands with soap and water before and after you change your bandage (dressing). If soap and water are not available, use hand sanitizer. ? Change your dressing as told by your health care provider.  Check your puncture site every day for signs of infection. Check for: ? More redness, swelling, or pain. ? Fluid or blood. ? Warmth. ? Pus or a bad smell. Activity  Return to your normal activities as told by your health care provider. Ask your health care provider what activities are safe for you.  Do not lift anything that is heavier than 10 lb (4.5 kg), or the limit that you are told, until your health care provider says that it is safe.  Do not drive for 24 hours if you were given a sedative during your procedure. General instructions   Take over-the-counter and prescription medicines only as told by your health care provider.  Do not take baths, swim, or use a hot tub until your health care provider approves. Ask your health care provider if you may take showers. You may only be allowed to take sponge baths.  If directed, put ice on the affected area. To do this: ? Put ice in a plastic bag. ? Place a towel between your skin and the bag. ? Leave the ice on for 20 minutes, 2-3 times a day.  Keep all follow-up visits as told by your health care provider. This is important. Contact a  health care provider if:  Your pain is not controlled with medicine.  You have a fever.  You have more redness, swelling, or pain around the puncture site.  You have fluid or blood coming from the puncture site.  Your puncture site feels warm to the touch.  You have pus or a bad smell coming from the puncture site. Summary  After the procedure, it is common to have mild pain, tenderness, swelling, and bruising.  Follow instructions from your health care provider about how to take care of the puncture site and what activities are safe for you.  Take over-the-counter and prescription medicines only as told by your health care provider.  Contact a health care provider if you have any signs of infection, such as fluid or blood coming from the puncture site. This information is not intended to replace advice given to you by your health care provider. Make sure you discuss any questions you have with your health care provider. Document Revised: 02/13/2019 Document Reviewed: 02/13/2019 Elsevier Patient Education  2020 [employer]  

## 2020-04-29 NOTE — Progress Notes (Signed)
Patient assessed for 30-minutes post-bone marrow biopsy. Site of boiopsy clean and intact. Vitals remained stable. Patient had no complaints.

## 2020-04-30 ENCOUNTER — Other Ambulatory Visit: Payer: Self-pay

## 2020-04-30 ENCOUNTER — Telehealth: Payer: Self-pay | Admitting: Adult Health

## 2020-04-30 NOTE — Progress Notes (Signed)
[place]   [phone] [phone]   Clinic Follow up Note   Patient Care Team: Jani Gravel, MD as PCP - General (Internal Medicine) [name], MD as Consulting Physician (General Surgery) Truitt Merle, MD as Consulting Physician (Hematology) Kyung Rudd, MD as Consulting Physician (Radiation Oncology) Gardenia Phlegm, NP as Nurse Practitioner (Hematology and Oncology)  Date of Service:  05/05/2020  CHIEF COMPLAINT: discuss bone marrow biopsy results    SUMMARY OF ONCOLOGIC HISTORY: Oncology History Overview Note  Cancer Staging Malignant neoplasm of upper-inner quadrant of left breast in female, estrogen receptor positive ([place]) Staging form: Breast, AJCC 8th Edition - Clinical stage from 02/16/2017: Stage IA (cT1b, cN0, cM0, G2, ER: Positive, PR: Positive, HER2: Negative) - Unsigned Staging comments: Staged at breast conference  - Pathologic stage from 03/16/2017: Stage IA (pT1b, pN0, cM0, G2, ER: Positive, PR: Positive, HER2: Negative, Oncotype DX score: 27) - Signed by Truitt Merle, MD on 04/24/2017     Malignant neoplasm of upper-inner quadrant of left breast in female, estrogen receptor positive ([place])  02/09/2017 Initial Diagnosis   Malignant neoplasm of upper-inner quadrant of left breast in female, estrogen receptor positive ([place])   02/09/2017 Initial Biopsy   Diagnosis Breast, left, needle core biopsy - INVASIVE DUCTAL CARCINOMA, 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 Dalbert Batman    03/16/2017 Pathology Results   PATHOLOGY REPORT Diagnosis 03/16/17 1. Breast, lumpectomy, Left - INVASIVE DUCTAL  CARCINOMA, NOTTINGHAM GRADE 2 OF 3, 0.9 CM - DUCTAL CARCINOMA IN SITU - MARGINS UNINVOLVED BY CARCINOMA (0.3 CM POSTERIOR MARGIN) - PREVIOUS BIOPSY SITE CHANGES - SEE ONCOLOGY TABLE BELOW 2. Lymph node, sentinel, biopsy, Left axillary #1 - NO CARCINOMA IDENTIFIED IN ONE LYMPH NODE (0/1) 3. Lymph node, sentinel, biopsy, Left axillary #2 - NO CARCINOMA IDENTIFIED IN ONE LYMPH NODE (0/1) 4. Lymph node, sentinel, biopsy, Left axillary #3 - NO CARCINOMA IDENTIFIED IN ONE LYMPH NODE (0/1) 5. Lymph node, sentinel, biopsy, Left axillary #4 - NO CARCINOMA IDENTIFIED IN ONE LYMPH NODE (0/1)    03/16/2017 Oncotype testing   Recurrance score of 27 with a 18% distance recurrance in the net 10 years with Tamoxifen alone    05/19/2017 - 06/16/2017 Radiation Therapy   Radiation with Dr. Lisbeth Renshaw   06/2017 -  Anti-estrogen oral therapy   anastrozole 1 mg once a day starting late 06/2017     04/29/2020 Pathology Results   DIAGNOSIS:   BONE MARROW, ASPIRATE, CLOT, CORE:  -  Cellular marrow with trilineage hematopoiesis  -  No morphologic evidence of carcinoma or hematopoietic neoplasm  -  See microscopic description below   PERIPHERAL BLOOD:  -  Polycythemia and thrombocytopenia  -  See complete blood cell count   MICROSCOPIC DESCRIPTION:   PERIPHERAL BLOOD SMEAR: The peripheral blood has a polycythemia and  thrombocytopenia.  Leukocytes are morphologically unremarkable.   BONE MARROW ASPIRATE: Spicular, cellular and adequate for evaluation  Erythroid precursors: Orderly maturation without overt dysplasia  Granulocytic precursors: Orderly maturation without overt dysplasia  Megakaryocytes: Qualitatively and quantitatively unremarkable  Lymphocytes/plasma cells: No lymphocytosis or plasmacytosis       CURRENT THERAPY:  anastrozole 1 mg once a day starting 06/2017  INTERVAL HISTORY:  Monica [name] is here for a follow up of left breast cancer and Polycythemia. She presents to the clinic  alone. Her daughter was called to be included in the visit today. She notes her bone marrow biopsy went well and has residual soreness at site. She notes she has slowed down on smoking but her daughter notes she has not slowed down enough. She plan to see Dr Annamaria Boots to help her quit smoking.  She notes she does not drink. She notes she had her 2021 Mammogram with [facility].    REVIEW OF SYSTEMS:   Constitutional: Denies fevers, chills or abnormal weight loss Eyes: Denies blurriness of vision Ears, nose, mouth, throat, and face: Denies mucositis or sore throat Respiratory: Denies cough, dyspnea or wheezes Cardiovascular: Denies palpitation, chest discomfort or lower extremity swelling Gastrointestinal:  Denies nausea, heartburn or change in bowel habits Skin: Denies abnormal skin rashes Lymphatics: Denies new lymphadenopathy or easy bruising Neurological:Denies numbness, tingling or new weaknesses Behavioral/Psych: Mood is stable, no new changes  All other systems were reviewed with the patient and are negative.  MEDICAL HISTORY:  Past Medical History:  Diagnosis Date  . CAD (coronary artery disease)    Cypher stent 09/2002  . Cancer [facility]) 2018   left breast, radiation therapy  . CHF (congestive heart failure) ([place])   . Depression   . Diabetes mellitus ([place])   . Hyperlipidemia   . Hypertension   . Myocardial infarction ([place])    2003,went into cardiac shock  . Osteoporosis   . Seizures ([place])    1st seizure age late 61's; most recent 07/28/17  . Sinusitis     SURGICAL HISTORY: Past Surgical History:  Procedure Laterality Date  . ABDOMINAL HYSTERECTOMY    . APPENDECTOMY    . BREAST LUMPECTOMY WITH RADIOACTIVE SEED AND SENTINEL LYMPH NODE BIOPSY Left 03/16/2017   Procedure: INJECT BLUE DYE LEFT BREAST, LEFT BREAST LUMPECTOMY WITH RADIOACTIVE SEED AND LEFT AXILLARY DEEP SENTINEL LYMPH NODE  BIOPSY ADJACENT TISSUE TRANSFER;  Surgeon: [name], MD;  Location: [facility];  Service:  General;  Laterality: Left;  . CARPAL TUNNEL RELEASE Bilateral   . COLONOSCOPY WITH PROPOFOL N/A 05/30/2015   Procedure: COLONOSCOPY WITH PROPOFOL;  Surgeon: Carol Ada, MD;  Location: [facility];  Service: Endoscopy;  Laterality: N/A;  . COLONOSCOPY WITH PROPOFOL N/A 06/09/2018   Procedure: COLONOSCOPY WITH PROPOFOL;  Surgeon: Carol Ada, MD;  Location: [facility];  Service: Endoscopy;  Laterality: N/A;  . CORONARY ANGIOPLASTY WITH STENT PLACEMENT  10-02-2002  . Exploratory abdominal     Bleeding in abdomen after tubal  . POLYPECTOMY  06/09/2018   Procedure: POLYPECTOMY;  Surgeon: Carol Ada, MD;  Location: [facility];  Service: Endoscopy;;  . TUBAL LIGATION    . UMBILICAL HERNIA REPAIR      I have reviewed the social history and family history with the patient and they are unchanged from previous note.  ALLERGIES:  is allergic to ace inhibitors, keflex [cephalexin], phenobarbital, sulfate, and altace [ramipril].  MEDICATIONS:  Current Outpatient Medications  Medication Sig Dispense Refill  . acetaminophen (TYLENOL) 500 MG tablet Take 1,000 mg by mouth every 6 (six) hours as needed for moderate pain or headache.    . anastrozole (ARIMIDEX) 1 MG tablet Take 1 tablet (1 mg total) by mouth daily. 90 tablet 1  . aspirin EC 81 MG tablet Take 81 mg by mouth at bedtime.    Marland Kitchen atorvastatin (LIPITOR) 40 MG tablet Take 40 mg by  mouth at bedtime.    . B-D ULTRAFINE III SHORT PEN 31G X 8 MM MISC Inject into the skin as directed.    . Calcium Carbonate-Vitamin D 600-400 MG-UNIT per tablet Take 1 tablet by mouth daily.     . carvedilol (COREG) 3.125 MG tablet Take 3.125 mg by mouth 2 (two) times daily.     . clonazePAM (KLONOPIN) 1 MG tablet Take 1 mg by mouth at bedtime.     . Fluticasone-Umeclidin-Vilant (TRELEGY ELLIPTA) 100-62.5-25 MCG/INH AEPB Inhale 1 puff then rinse mouth, once daily 60 each 12  . glimepiride (AMARYL) 4 MG tablet Take 4 mg by mouth 2 (two) times daily.    .  halobetasol (ULTRAVATE) 0.05 % cream Apply 1 application topically 2 (two) times daily as needed (for rash).     . Insulin Detemir (LEVEMIR) 100 UNIT/ML Pen Inject 17-25 Units into the skin See admin instructions. Inject 25 units SQ in the morning and inject 17 units SQ at night    . insulin lispro (HUMALOG) 100 UNIT/ML injection Inject 7 Units into the skin 2 (two) times daily with breakfast and lunch.     . insulin lispro (HUMALOG) 100 UNIT/ML injection Inject 9 Units into the skin at bedtime.    . levETIRAcetam (KEPPRA) 500 MG tablet Take 1 tablet (500 mg total) by mouth 2 (two) times daily. 180 tablet 3  . losartan (COZAAR) 50 MG tablet Take 50 mg by mouth at bedtime.     . Multiple Vitamin (MULTIVITAMIN WITH MINERALS) TABS tablet Take 1 tablet by mouth at bedtime.    . Omega-3 Fatty Acids (FISH OIL) 1200 MG CAPS Take 2,400 mg by mouth 2 (two) times daily.    Marland Kitchen omeprazole (PRILOSEC) 20 MG capsule Take 20 mg by mouth at bedtime.     Letta Pate VERIO test strip USE THREE TIMES A DAY AND AS NEEDED FOR HYPOGLYCEMIA    . pioglitazone (ACTOS) 30 MG tablet Take 30 mg by mouth at bedtime.    Bertram Gala Glycol-Propyl Glycol (SYSTANE) 0.4-0.3 % SOLN Place 1 drop into both eyes 2 (two) times daily.     . risedronate (ACTONEL) 150 MG tablet Take 150 mg by mouth every 30 (thirty) days.    Marland Kitchen sertraline (ZOLOFT) 100 MG tablet Take 100 mg by mouth at bedtime.    . TRIJARDY XR 12.5-2.02-999 MG TB24 Take 1 tablet by mouth 2 (two) times daily.    . nitroGLYCERIN (NITROSTAT) 0.4 MG SL tablet Place 1 tablet (0.4 mg total) under the tongue every 5 (five) minutes as needed for chest pain. 25 tablet 1   No current facility-administered medications for this visit.    PHYSICAL EXAMINATION: ECOG PERFORMANCE STATUS: 1 - Symptomatic but completely ambulatory  Vitals:   05/05/20 0959  BP: 102/66  Pulse: 65  Resp: 17  Temp: 97.7 F (36.5 C)  SpO2: 93%   Filed Weights   05/05/20 0959  Weight: 118 lb 9.6 oz  (53.8 kg)    GENERAL:alert, no distress and comfortable SKIN: skin color, texture, turgor are normal, no rashes or significant lesions EYES: normal, Conjunctiva are pink and non-injected, sclera clear  NECK: supple, thyroid normal size, non-tender, without nodularity LYMPH:  no palpable lymphadenopathy in the cervical, axillary  LUNGS: clear to auscultation and percussion with normal breathing effort HEART: regular rate & rhythm and no murmurs and no lower extremity edema ABDOMEN:abdomen soft, non-tender and normal bowel sounds Musculoskeletal:no cyanosis of digits and no clubbing  NEURO: alert &  oriented x 3 with fluent speech, no focal motor/sensory deficits BREAST: S/p left lumpectomy: Surgical incision healed well. No palpable mass, nodules or adenopathy bilaterally. Breast exam benign.   LABORATORY DATA:  I have reviewed the data as listed CBC Latest Ref Rng & Units 04/29/2020 02/12/2020 02/12/2020  WBC 4.0 - 10.5 K/uL 5.8 7.1 -  Hemoglobin 12.0 - 15.0 g/dL 16.2(H) 16.6(H) -  Hematocrit 36 - 46 % 49.3(H) 51.9(H) 51.6(H)  Platelets 150 - 400 K/uL 107(L) 126(L) -     CMP Latest Ref Rng & Units 02/12/2020 12/26/2019 06/27/2019  Glucose 70 - 99 mg/dL 177(H) 281(H) 174(H)  BUN 8 - 23 mg/dL '14 21 12  '$ Creatinine 0.44 - 1.00 mg/dL 0.71 0.78 0.71  Sodium 135 - 145 mmol/L 141 136 139  Potassium 3.5 - 5.1 mmol/L 4.2 4.4 4.0  Chloride 98 - 111 mmol/L 106 103 102  CO2 22 - 32 mmol/L '23 24 29  '$ Calcium 8.9 - 10.3 mg/dL 9.0 9.5 9.8  Total Protein 6.5 - 8.1 g/dL 7.4 7.3 7.1  Total Bilirubin 0.3 - 1.2 mg/dL 0.4 0.5 0.5  Alkaline Phos 38 - 126 U/L 76 73 58  AST 15 - 41 U/L '20 18 26  '$ ALT 0 - 44 U/L '22 23 25      '$ RADIOGRAPHIC STUDIES: I have personally reviewed the radiological images as listed and agreed with the findings in the report. No results found.   ASSESSMENT & PLAN:  Monica [name] is a 74 y.o. female with    1. Polycythemia secondary to smoking  -She  hasdevelopederythrocytosis since 2019, with highesthematocritHCTaround 51.6% and Hg 16.6.Her early 2021 erythropoietin was elevated at 22, supportsecondary polycythemia.  -Her MPN genomic testing panel from 12/2019 showed negativeJAK2, CALR, ABL1, KIT, etc, the only positive was TP53 mutation, which could be associated with myeloid neoplasm. Overall not definitve for a MPN diagnosis.  -Lab workup from 02/12/20 show normal B12 and retic panel. She still has with mildly elevated Erythropoietin, RBC, Hg and Hct, but has lower iron 58, ferritin 6, Sat ratio 14.She also had mild thrombocytopenia.  -Her US abdomen from 04/11/20 shows mild splenomegaly 12.5x13x1c7.8cm. Scan also shows fatty liver and benign cystic liver lesions.  -She underwent bone marrow biopsy from 04/29/20 which showed no morphologic evidence of carcinoma or hematopoietic neoplasm. I discussed with her and her daughter today in details. Her cytogenetics is still pending so I will call her with results.  -Overall her work up indicates polycythemia secondary to her heavy smoking history and COPD. I again encouraged her to quit smoking and f/u with pulmonary -no need phlebotomy or medical treatment for secondary polycythemia, I encourage her to stay hydrated   2. 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  -No history of liver disease or alcohol abuse. 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 She does have splenomegaly and mild fatty liver on 04/11/20 US abdomen. -Given her fatty liver, I advised her to quit smoking, eat a healthy diet with quality vegetable and lean meat and continue to avoid alcohol.  -Given her mild thrombocytopenia she is fine to continue Aspirin for now. I encouraged her to watch for injury and bleeding.   3. 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 She is s/p left breastlumpectomyand adjuvant radiation.  -Given her  advanced age, comorbidities, strong ER and PR expression in her tumor, we decided not to pursue adjuvant chemotherapy. -She started antiestrogen therapy with Anastrozole in 06/2017. Shehas tolerated well with no issues so far. -She is clinically doing well. Physical exam unremarkable. Continue surveillance. Per pt her 2021 mammogram was done in [place]. I will request report.  -Continue Anastrozole   4. Iron deficiency without anemia -Her 2021 labs showed evidence of iron deficiency and slight microcytosis -She denies bleeding of nose, gums, bloody or black stool. She did have Diverticulosis and 3-4 polyps removed with her last colonoscopy in 05/2018 which can lead to bleeding.  -Continue OTC oral iron with Ferrous sulfate. If her iron deficiency dose not improve on oral iron I may refer her to Dr. Benson Norway to repeat colonoscopy sooner.  5. Osteopenia  -Continue calcium and vitamin D -Based on her DEXA from 06/30/17 she is osteopenic with a left femur neck T-Score of -2.4  -Wepreviouslydiscussed the potential negative impact on her bone density from anastrozole. We will follow-up her bone density scan every 2 years -She has started risedronate by her PCP, tolerating well, will continue. -Her latestDEXAwas to be in 2020. I will check with Dr. Maudie Mercury her PCP for report.  6. Smoking and lung nodule, Emphysema -She has heavy smoking history since in her late 27s, has cut cigarettes to half pack a day -She underwent low-dose CT scan in 02/2018 for lung cancer screening, which showed several small pulmonary nodules, largest in the right upper lobe 7 mm, likely benign, will follow-up. -Screening CT Chest from 12/27/19 was negative for malignancy but does show emphysema in her lungs. 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 She also has mild thrombocytopenia. I discussed the effects of her smoking and have strongly advised her to quit. She is willing to quit and will f/u with Dr Annamaria Boots for help.   7. Comorbidities: DM, Depression and mood swing -She has become slightly more irritable since she started anastrozole, much improved now -She is on Zoloft 100 mg daily, mostly stable mood.  -I encouraged her to f/u with her PCP office soon and request next DEXA scan.    PLAN: -BM biopsy results discussed with pt and her daughter -Lab and f/u in 6 months with Lacie  -Request 2021 Mammogram from [place]    No problem-specific [place] notes found for this encounter.   No orders of the defined types were placed in this encounter.  All questions were answered. The patient knows to call the clinic with any problems, questions or concerns. No barriers to learning was detected. The total time spent in the appointment was 30 minutes.     Truitt Merle, MD 05/05/2020   I, Joslyn Devon, am acting as scribe for Truitt Merle, MD.   I have reviewed the above documentation for accuracy and completeness, and I agree with the above.

## 2020-04-30 NOTE — Telephone Encounter (Signed)
No 7/20 los. No changes made to pt's schedule.

## 2020-05-01 ENCOUNTER — Telehealth: Payer: Self-pay | Admitting: Nurse Practitioner

## 2020-05-01 NOTE — Telephone Encounter (Signed)
Rescheduled appt on 9/16 to 9/15. Provider on PAL and pt is aware of appt time and date.

## 2020-05-02 LAB — SURGICAL PATHOLOGY

## 2020-05-05 ENCOUNTER — Other Ambulatory Visit: Payer: Self-pay

## 2020-05-05 ENCOUNTER — Inpatient Hospital Stay: Payer: Medicare Other | Admitting: Hematology

## 2020-05-05 ENCOUNTER — Encounter: Payer: Self-pay | Admitting: Hematology

## 2020-05-05 VITALS — BP 102/66 | HR 65 | Temp 97.7°F | Resp 17 | Ht 65.0 in | Wt 118.6 lb

## 2020-05-05 DIAGNOSIS — E2839 Other primary ovarian failure: Secondary | ICD-10-CM | POA: Diagnosis not present

## 2020-05-05 DIAGNOSIS — Z170 Estrogen receptor positive status [ER+]: Secondary | ICD-10-CM | POA: Diagnosis not present

## 2020-05-05 DIAGNOSIS — C50212 Malignant neoplasm of upper-inner quadrant of left female breast: Secondary | ICD-10-CM

## 2020-05-05 MED ORDER — ANASTROZOLE 1 MG PO TABS: 1.0000 mg | ORAL_TABLET | Freq: Every day | ORAL | 1 refills | Status: DC

## 2020-05-06 ENCOUNTER — Other Ambulatory Visit: Payer: Self-pay | Admitting: Hematology

## 2020-05-06 ENCOUNTER — Telehealth: Payer: Self-pay | Admitting: Hematology

## 2020-05-06 DIAGNOSIS — E2839 Other primary ovarian failure: Secondary | ICD-10-CM

## 2020-05-06 NOTE — Telephone Encounter (Signed)
Scheduled per 7/26 los. Pt is aware of appts rescheduled to 11/03/2020.

## 2020-05-07 ENCOUNTER — Encounter (HOSPITAL_COMMUNITY): Payer: Self-pay | Admitting: Hematology

## 2020-05-12 NOTE — Telephone Encounter (Signed)
Dr. Annamaria Boots, please see mychart message sent by pt and advise.

## 2020-05-12 NOTE — Telephone Encounter (Signed)
Suggest we offer referral to our pharmacy team for smoking cessation help

## 2020-06-25 ENCOUNTER — Ambulatory Visit: Payer: Medicare Other | Admitting: Nurse Practitioner

## 2020-06-25 ENCOUNTER — Other Ambulatory Visit: Payer: Medicare Other

## 2020-06-26 ENCOUNTER — Other Ambulatory Visit: Payer: Medicare Other

## 2020-06-26 ENCOUNTER — Ambulatory Visit: Payer: Medicare Other | Admitting: Nurse Practitioner

## 2020-07-10 ENCOUNTER — Other Ambulatory Visit: Payer: Self-pay

## 2020-07-10 ENCOUNTER — Encounter: Payer: Self-pay | Admitting: Internal Medicine

## 2020-07-10 ENCOUNTER — Ambulatory Visit (INDEPENDENT_AMBULATORY_CARE_PROVIDER_SITE_OTHER): Payer: Medicare Other | Admitting: Internal Medicine

## 2020-07-10 VITALS — BP 116/62 | HR 70 | Temp 95.6°F | Ht 65.0 in | Wt 118.8 lb

## 2020-07-10 DIAGNOSIS — Z720 Tobacco use: Secondary | ICD-10-CM

## 2020-07-10 DIAGNOSIS — J449 Chronic obstructive pulmonary disease, unspecified: Secondary | ICD-10-CM

## 2020-07-10 DIAGNOSIS — G4734 Idiopathic sleep related nonobstructive alveolar hypoventilation: Secondary | ICD-10-CM | POA: Diagnosis not present

## 2020-07-10 DIAGNOSIS — D751 Secondary polycythemia: Secondary | ICD-10-CM | POA: Diagnosis not present

## 2020-07-10 NOTE — Patient Instructions (Signed)
Order- reorder home sleep test   Dx nocturnal hypoxemia  Please call us about 2 weeks after our sleep test to see if results and recommendations are ready yet. If appropriate, we may be able to start treatment before we see you next.

## 2020-07-10 NOTE — Progress Notes (Signed)
03/07/20- 53 yoF Smoker for sleep evaluation.- referred courtesy of Dr Burr Medico with concern of polycythemia. Medical problem list includes- Aortic Atherosclerosis, CAD/MI/ stent, DM2, Seizure Disorder, Dyslipidemia, Breast Cancer L(Lumpectomy/ XRT), Secondary Polycythemia, Tobacco Abuse,  She complains of daytime sleepiness. Occasional 1 hour afternoon naps help. Asleep before tv times out. Sleeps through the night. No sleep meds. Occasional decaf coffee. Husband doesn't tell her she snores. Smoking now about 1/2 ppd. Known emphysema. No wheeze. Some cough with scant clear mucus.Admits DOE only with brisk sustained walking.  CT chest Low dose screen 12/27/19-  IMPRESSION: 1. Lung-RADS 2, benign appearance or behavior. Continue annual screening with low-dose chest CT without contrast in 12 months. 2. Ascending thoracic aorta measures 4.2 cm diameter. Continued attention on annual lung cancer screening surveillance recommended. 3.  Emphysema (ICD10-J43.9) and Aortic Atherosclerosis (ICD10-170.0)  07/10/20- 89 yoF Smoker followed for sleep , complicated by Polycythemia Aortic Atherosclerosis, CAD/MI/ stent, DM2, Seizure Disorder, Dyslipidemia, Breast Cancer L(Lumpectomy/ XRT), Secondary Polycythemia, Tobacco Abuse, HST was ordered- not done PFT 04/17/20- Minimal obstruction, no resp to BD, mild Diffusion defect  FEV1/FVC  .64 -----pt is here no compliants Covid vax- 2 Moderna Flu vax- done No changes. Denies breathing discomfort. Original concern was polycythemia. HST wasn't done- she agrees to reorder to check night time respiratory status.  CBC 7/20- Hgb 16.2. Bone marrow bx in July- no obvious marrow problem.   ROS-see HPI   + = positive Constitutional:    weight loss, night sweats, fevers, chills, fatigue, lassitude. HEENT:    headaches, difficulty swallowing, tooth/dental problems, sore throat,       sneezing, itching, ear ache, nasal congestion, post nasal drip, snoring CV:    chest pain,  orthopnea, PND, swelling in lower extremities, anasarca,                                   dizziness, palpitations Resp:   shortness of breath with exertion or at rest.                productive cough,   non-productive cough, coughing up of blood.              change in color of mucus.  wheezing.   Skin:    rash or lesions. GI:  No-   heartburn, indigestion, abdominal pain, nausea, vomiting, diarrhea,                 change in bowel habits, loss of appetite GU: dysuria, change in color of urine, no urgency or frequency.   flank pain. MS:   joint pain, stiffness, decreased range of motion, back pain. Neuro-     nothing unusual Psych:  change in mood or affect.  depression or +anxiety.   memory loss.  OBJ- Physical Exam General- Alert, Oriented, Affect-appropriate, Distress- none acute, +slender Skin- rash-none, lesions- none, excoriation- none Lymphadenopathy- none Head- atraumatic            Eyes- Gross vision intact, PERRLA, conjunctivae and secretions clear            Ears- Hearing, canals-normal            Nose- Clear, no-Septal dev, mucus, polyps, erosion, perforation             Throat- Mallampati II-III , mucosa clear , drainage- none, tonsils- atrophic, +edentulous/ dentures Neck- flexible , trachea midline, no stridor , thyroid nl, carotid no bruit Chest -  symmetrical excursion , unlabored           Heart/CV- RRR , no murmur , no gallop  , no rub, nl s1 s2                           - JVD- none , edema- none, stasis changes- none, varices- none           Lung- +few basilar crackles, wheeze- none, cough- none , dullness-none, rub- none           Chest wall-  Abd-  Br/ Gen/ Rectal- Not done, not indicated Extrem- cyanosis- none, clubbing, none, atrophy- none, strength- nl, no clubbing Neuro- grossly intact to observation

## 2020-07-11 NOTE — Assessment & Plan Note (Signed)
We will reorder home sleep test, looking for nocturnal desaturation potentially causing polycythemia.

## 2020-07-11 NOTE — Assessment & Plan Note (Signed)
Minimal emphysema with negligible bronchitis symptoms.  Not cost effective to her to be on bronchodilators at this point.

## 2020-07-11 NOTE — Assessment & Plan Note (Signed)
Making no effort to quit. Strongly encouraged to try. She had previously turned down referral to pharmacy team for smoking cessation.

## 2020-07-18 ENCOUNTER — Other Ambulatory Visit: Payer: Self-pay

## 2020-07-18 ENCOUNTER — Ambulatory Visit: Payer: Medicare Other

## 2020-07-18 DIAGNOSIS — R0683 Snoring: Secondary | ICD-10-CM | POA: Diagnosis not present

## 2020-07-18 DIAGNOSIS — G4734 Idiopathic sleep related nonobstructive alveolar hypoventilation: Secondary | ICD-10-CM

## 2020-07-23 DIAGNOSIS — R0683 Snoring: Secondary | ICD-10-CM | POA: Diagnosis not present

## 2020-08-08 ENCOUNTER — Telehealth: Payer: Self-pay | Admitting: Internal Medicine

## 2020-08-08 DIAGNOSIS — G4734 Idiopathic sleep related nonobstructive alveolar hypoventilation: Secondary | ICD-10-CM

## 2020-08-08 NOTE — Telephone Encounter (Signed)
Tried calling the pt and there was no answer- line rings and then busy signal

## 2020-08-15 NOTE — Telephone Encounter (Signed)
Her home sleep test showed nocturnal hypoxemia, with oxygen saturation below 85% for 208 minutes. Please order new DME, new O2 2L for sleep, dx nocturnal hypoxemia  Please make sure she has return ov w/in next 3 months

## 2020-08-15 NOTE — Telephone Encounter (Signed)
Called and spoke with the pt and notified her of results/recs per Dr Annamaria Boots  She verbalized understanding  Nothing further needed

## 2020-08-15 NOTE — Telephone Encounter (Signed)
Dr. Annamaria Boots please advise on the results of pts HST. Thanks.

## 2020-10-09 ENCOUNTER — Ambulatory Visit: Payer: Medicare Other | Admitting: Primary Care

## 2020-10-09 ENCOUNTER — Ambulatory Visit: Payer: Medicare Other | Admitting: Internal Medicine

## 2020-10-09 ENCOUNTER — Encounter: Payer: Self-pay | Admitting: Primary Care

## 2020-10-09 ENCOUNTER — Other Ambulatory Visit: Payer: Self-pay

## 2020-10-09 VITALS — BP 114/64 | HR 58 | Ht 65.0 in | Wt 119.0 lb

## 2020-10-09 DIAGNOSIS — J449 Chronic obstructive pulmonary disease, unspecified: Secondary | ICD-10-CM | POA: Diagnosis not present

## 2020-10-09 DIAGNOSIS — Z720 Tobacco use: Secondary | ICD-10-CM

## 2020-10-09 DIAGNOSIS — G4734 Idiopathic sleep related nonobstructive alveolar hypoventilation: Secondary | ICD-10-CM | POA: Diagnosis not present

## 2020-10-09 MED ORDER — ALBUTEROL SULFATE HFA 108 (90 BASE) MCG/ACT IN AERS
2.0000 | INHALATION_SPRAY | Freq: Four times a day (QID) | RESPIRATORY_TRACT | 6 refills | Status: DC | PRN
Start: 2020-10-09 — End: 2021-07-14

## 2020-10-09 NOTE — Progress Notes (Signed)
@Patient  ID: Monica [name], female    DOB: 05-16-1946, 74 y.o.   MRN: [id number]  Chief Complaint  Patient presents with  . Follow-up    Pt states she has been doing good since last visit and denies any complaints. Pt is still wearing O2 at night at 2L.    Referring provider: Jani Gravel, MD  HPI: 74 year old female, current every day smoker. PMH significant for COPD mixed type, emphysema, acute respiratory failure with hypoxia, CAD/MI with stent, seizure disorder, secondary polycythemia,DM2,  breast cancer (lumpectomy/XRT), sleep disorder, tobacco abuse. Patient of Dr. Annamaria Boots, last seen on 07/10/20.   HST 07/18/20 AHI 5.3. with SpO2 low 76% and average 86%. Pulmonary function testing in July 2021 showed minimal obstruction, no BD response, mild diffusion defect (FEV1/FVC ratio 64).  LDCT 12/27/19 showed lung RADS 2. Continue annual screening with low-dose CT wo contrast.   10/09/2020 - Interim hx  Patient presents today for 3 month follow-up. She is doing well, no acute complaints. She had a home sleep study on 07/18/20 that showed mild OSA with significant nocturnal hypoxia. She is compliant with Trelegy and nocturnal oxygen. She is wearing 2L oxygen at night.  She reports sleeping well at night, gets 7-8 hours of sleep. Denies significant daytime fatigue. She is still smoking 1/2 pack a day.     Allergies  Allergen Reactions  . Ace Inhibitors Dermatitis and Cough  . Keflex [Cephalexin] Itching  . Phenobarbital Other (See Comments)    Black spots on hands and feet.  . Sulfate Nausea And Vomiting and Other (See Comments)    Dizziness   . Altace [Ramipril] Other (See Comments)    UNSPECIFIED REACTION      Immunization History  Administered Date(s) Administered  . Influenza, High Dose Seasonal PF 07/27/2017, 07/20/2018  . Influenza-Unspecified 07/09/2020  . PFIZER SARS-COV-2 Vaccination 11/16/2019, 12/06/2019, 07/29/2020  . Pneumococcal Conjugate-13 07/27/2016  . Pneumococcal  Polysaccharide-23 07/25/2012, 08/02/2017  . Pneumococcal-Unspecified 08/02/2017  . Zoster Recombinat (Shingrix) 11/22/2018, 04/09/2019    Past Medical History:  Diagnosis Date  . CAD (coronary artery disease)    Cypher stent 09/2002  . Cancer [facility]) 2018   left breast, radiation therapy  . CHF (congestive heart failure) ([place])   . Depression   . Diabetes mellitus ([place])   . Hyperlipidemia   . Hypertension   . Myocardial infarction ([place])    2003,went into cardiac shock  . Osteoporosis   . Seizures ([place])    1st seizure age late 40's; most recent 07/28/17  . Sinusitis     Tobacco History: Social History   Tobacco Use  Smoking Status Current Every Day Smoker  . Packs/day: 2.00  . Years: 30.00  . Pack years: 60.00  . Types: Cigarettes  Smokeless Tobacco Never Used  Tobacco Comment   0.5ppd as of 10/09/20   Ready to quit: Not Answered Counseling given: Not Answered Comment: 0.5ppd as of 10/09/20   Outpatient Medications Prior to Visit  Medication Sig Dispense Refill  . acetaminophen (TYLENOL) 500 MG tablet Take 1,000 mg by mouth every 6 (six) hours as needed for moderate pain or headache.    . anastrozole (ARIMIDEX) 1 MG tablet Take 1 tablet (1 mg total) by mouth daily. 90 tablet 1  . aspirin EC 81 MG tablet Take 81 mg by mouth at bedtime.    Marland Kitchen atorvastatin (LIPITOR) 40 MG tablet Take 40 mg by mouth at bedtime.    . B-D ULTRAFINE III SHORT PEN 31G  X 8 MM MISC Inject into the skin as directed.    . Calcium Carbonate-Vitamin D 600-400 MG-UNIT per tablet Take 1 tablet by mouth daily.     . carvedilol (COREG) 3.125 MG tablet Take 3.125 mg by mouth 2 (two) times daily.     . clonazePAM (KLONOPIN) 1 MG tablet Take 1 mg by mouth at bedtime.     . Fluticasone-Umeclidin-Vilant (TRELEGY ELLIPTA) 100-62.5-25 MCG/INH AEPB Inhale 1 puff then rinse mouth, once daily 60 each 12  . glimepiride (AMARYL) 4 MG tablet Take 4 mg by mouth 2 (two) times daily.    . halobetasol (ULTRAVATE) 0.05  % cream Apply 1 application topically 2 (two) times daily as needed (for rash).     . Insulin Detemir (LEVEMIR) 100 UNIT/ML Pen Inject 17-25 Units into the skin See admin instructions. Inject 25 units SQ in the morning and inject 17 units SQ at night    . insulin lispro (HUMALOG) 100 UNIT/ML injection Inject 7 Units into the skin 2 (two) times daily with breakfast and lunch.     . insulin lispro (HUMALOG) 100 UNIT/ML injection Inject 9 Units into the skin at bedtime.    . levETIRAcetam (KEPPRA) 500 MG tablet Take 1 tablet (500 mg total) by mouth 2 (two) times daily. 180 tablet 3  . losartan (COZAAR) 50 MG tablet Take 50 mg by mouth at bedtime.     . Multiple Vitamin (MULTIVITAMIN WITH MINERALS) TABS tablet Take 1 tablet by mouth at bedtime.    . Omega-3 Fatty Acids (FISH OIL) 1200 MG CAPS Take 2,400 mg by mouth 2 (two) times daily.    Marland Kitchen omeprazole (PRILOSEC) 20 MG capsule Take 20 mg by mouth at bedtime.     Glory Rosebush VERIO test strip USE THREE TIMES A DAY AND AS NEEDED FOR HYPOGLYCEMIA    . pioglitazone (ACTOS) 30 MG tablet Take 30 mg by mouth at bedtime.    Vladimir Faster Glycol-Propyl Glycol 0.4-0.3 % SOLN Place 1 drop into both eyes 2 (two) times daily.    . risedronate (ACTONEL) 150 MG tablet Take 150 mg by mouth every 30 (thirty) days.    Marland Kitchen sertraline (ZOLOFT) 100 MG tablet Take 100 mg by mouth at bedtime.    . TRIJARDY XR 12.5-2.02-999 MG TB24 Take 1 tablet by mouth 2 (two) times daily.    . nitroGLYCERIN (NITROSTAT) 0.4 MG SL tablet Place 1 tablet (0.4 mg total) under the tongue every 5 (five) minutes as needed for chest pain. 25 tablet 1   No facility-administered medications prior to visit.   Review of Systems  Review of Systems  Constitutional: Negative.   HENT: Negative.   Respiratory: Negative for cough, chest tightness, shortness of breath and wheezing.   Psychiatric/Behavioral: Negative.    Physical Exam  BP 114/64 (BP Location: Right Arm, Cuff Size: Normal)   Pulse (!) 58    Ht 5\' 5"  (1.651 m)   Wt 119 lb (54 kg)   SpO2 97%   BMI 19.80 kg/m  Physical Exam Constitutional:      General: She is not in acute distress.    Appearance: Normal appearance. She is normal weight. She is not ill-appearing.     Comments: Thin frame  HENT:     Head: Normocephalic and atraumatic.     Mouth/Throat:     Mouth: Mucous membranes are moist.     Pharynx: Oropharynx is clear. No oropharyngeal exudate or posterior oropharyngeal erythema.  Cardiovascular:     Rate  and Rhythm: Normal rate and regular rhythm.  Pulmonary:     Effort: Pulmonary effort is normal.     Breath sounds: Normal breath sounds. No wheezing, rhonchi or rales.  Musculoskeletal:        General: Normal range of motion.     Cervical back: Normal range of motion and neck supple.     Right lower leg: No edema.     Left lower leg: No edema.  Skin:    General: Skin is warm and dry.  Neurological:     General: No focal deficit present.     Mental Status: She is alert and oriented to person, place, and time. Mental status is at baseline.  Psychiatric:        Mood and Affect: Mood normal.        Behavior: Behavior normal.        Thought Content: Thought content normal.        Judgment: Judgment normal.      Lab Results:  CBC    Component Value Date/Time   WBC 5.8 04/29/2020 0825   WBC 6.1 06/27/2019 1136   RBC 6.23 (H) 04/29/2020 0825   HGB 16.2 (H) 04/29/2020 0825   HGB 14.8 08/25/2017 1224   HCT 49.3 (H) 04/29/2020 0825   HCT 51.9 (H) 02/12/2020 1119   HCT 44.4 08/25/2017 1224   PLT 107 (L) 04/29/2020 0825   PLT 95 (L) 08/25/2017 1224   MCV 79.1 (L) 04/29/2020 0825   MCV 81.3 08/25/2017 1224   MCH 26.0 04/29/2020 0825   MCHC 32.9 04/29/2020 0825   RDW 18.0 (H) 04/29/2020 0825   RDW 14.9 (H) 08/25/2017 1224   LYMPHSABS 1.7 04/29/2020 0825   LYMPHSABS 1.6 08/25/2017 1224   MONOABS 0.5 04/29/2020 0825   MONOABS 0.3 08/25/2017 1224   EOSABS 0.1 04/29/2020 0825   EOSABS 0.1 08/25/2017 1224    BASOSABS 0.0 04/29/2020 0825   BASOSABS 0.0 08/25/2017 1224    BMET    Component Value Date/Time   NA 141 02/12/2020 1118   NA 139 08/25/2017 1224   K 4.2 02/12/2020 1118   K 4.3 08/25/2017 1224   CL 106 02/12/2020 1118   CO2 23 02/12/2020 1118   CO2 26 08/25/2017 1224   GLUCOSE 177 (H) 02/12/2020 1118   GLUCOSE 149 (H) 08/25/2017 1224   BUN 14 02/12/2020 1118   BUN 10.1 08/25/2017 1224   CREATININE 0.71 02/12/2020 1118   CREATININE 0.7 08/25/2017 1224   CALCIUM 9.0 02/12/2020 1118   CALCIUM 9.4 08/25/2017 1224   GFRNONAA >60 02/12/2020 1118   GFRAA >60 02/12/2020 1118    BNP    Component Value Date/Time   BNP 222.0 (H) 11/26/2017 0614    ProBNP No results found for: PROBNP  Imaging: No results found.   Assessment & Plan:   COPD mixed type ([place]) - Well controlled on present treatment. No acute symptoms or recent exacerbations. Continue Trelegy 100 one puff daily. Sending in RX for albuterol hfa rescue inhaler to use as needed   Nocturnal hypoxia - HST 07/18/20 showed patient spent 208 min with SpO2 <88%  - Continue 2L/min nocturnal oxygen  Tobacco abuse - Current 1/2 ppd smoker - Discussed smoking cessation at length > 5-8 min - Following with LDCT annually, lung RADS [place], NP 10/09/2020

## 2020-10-09 NOTE — Assessment & Plan Note (Addendum)
-   Well controlled on present treatment. No acute symptoms or recent exacerbations. Continue Trelegy 100 one puff daily. Sending in RX for albuterol hfa rescue inhaler to use as needed

## 2020-10-09 NOTE — Assessment & Plan Note (Addendum)
-   HST 07/18/20 showed patient spent 208 min with SpO2 <88%  - Continue 2L/min nocturnal oxygen

## 2020-10-09 NOTE — Patient Instructions (Signed)
Pleasure meeting you today [name] you are doing well  Recommendations: - Continue Trelegy 1 puff daily (rinse mouth after use) - Sending in RX for Albuterol rescue inhaler, take 2 puffs every 6 hours if needed for breakthrough shortness of breath/wheeing  - Continue to wear 2L oxygen at night - Continue to work on cutting down amount you are smoking. Recommend picking a quit date.   Follow-up: 3-4 months with Dr. Annamaria Boots or sooner if needed    Chronic Obstructive Pulmonary Disease Chronic obstructive pulmonary disease (COPD) is a long-term (chronic) lung problem. When you have COPD, it is hard for air to get in and out of your lungs. Usually the condition gets worse over time, and your lungs will never return to normal. There are things you can do to keep yourself as healthy as possible.  Your doctor may treat your condition with: ? Medicines. ? Oxygen. ? Lung surgery.  Your doctor may also recommend: ? Rehabilitation. This includes steps to make your body work better. It may involve a team of specialists. ? Quitting smoking, if you smoke. ? Exercise and changes to your diet. ? Comfort measures (palliative care). Follow these instructions at home: Medicines  Take over-the-counter and prescription medicines only as told by your doctor.  Talk to your doctor before taking any cough or allergy medicines. You may need to avoid medicines that cause your lungs to be dry. Lifestyle  If you smoke, stop. Smoking makes the problem worse. If you need help quitting, ask your doctor.  Avoid being around things that make your breathing worse. This may include smoke, chemicals, and fumes.  Stay active, but remember to rest as well.  Learn and use tips on how to relax.  Make sure you get enough sleep. Most adults need at least 7 hours of sleep every night.  Eat healthy foods. Eat smaller meals more often. Rest before meals. Controlled breathing Learn and use tips on how to control  your breathing as told by your doctor. Try:  Breathing in (inhaling) through your nose for 1 second. Then, pucker your lips and breath out (exhale) through your lips for 2 seconds.  Putting one hand on your belly (abdomen). Breathe in slowly through your nose for 1 second. Your hand on your belly should move out. Pucker your lips and breathe out slowly through your lips. Your hand on your belly should move in as you breathe out.  Controlled coughing Learn and use controlled coughing to clear mucus from your lungs. Follow these steps: 1. Lean your head a little forward. 2. Breathe in deeply. 3. Try to hold your breath for 3 seconds. 4. Keep your mouth slightly open while coughing 2 times. 5. Spit any mucus out into a tissue. 6. Rest and do the steps again 1 or 2 times as needed. General instructions  Make sure you get all the shots (vaccines) that your doctor recommends. Ask your doctor about a flu shot and a pneumonia shot.  Use oxygen therapy and pulmonary rehabilitation if told by your doctor. If you need home oxygen therapy, ask your doctor if you should buy a tool to measure your oxygen level (oximeter).  Make a COPD action plan with your doctor. This helps you to know what to do if you feel worse than usual.  Manage any other conditions you have as told by your doctor.  Avoid going outside when it is very hot, cold, or humid.  Avoid people who have a sickness you  can catch (contagious).  Keep all follow-up visits as told by your doctor. This is important. Contact a doctor if:  You cough up more mucus than usual.  There is a change in the color or thickness of the mucus.  It is harder to breathe than usual.  Your breathing is faster than usual.  You have trouble sleeping.  You need to use your medicines more often than usual.  You have trouble doing your normal activities such as getting dressed or walking around the house. Get help right away if:  You have shortness  of breath while resting.  You have shortness of breath that stops you from: ? Being able to talk. ? Doing normal activities.  Your chest hurts for longer than 5 minutes.  Your skin color is more blue than usual.  Your pulse oximeter shows that you have low oxygen for longer than 5 minutes.  You have a fever.  You feel too tired to breathe normally. Summary  Chronic obstructive pulmonary disease (COPD) is a long-term lung problem.  The way your lungs work will never return to normal. Usually the condition gets worse over time. There are things you can do to keep yourself as healthy as possible.  Take over-the-counter and prescription medicines only as told by your doctor.  If you smoke, stop. Smoking makes the problem worse. This information is not intended to replace advice given to you by your health care provider. Make sure you discuss any questions you have with your health care provider. Document Revised: 09/09/2017 Document Reviewed: 11/01/2016 Elsevier Patient Education  [place] Oxygen Use, Adult When a medical condition keeps you from getting enough oxygen, your health care provider may instruct you to take extra oxygen at home. Your health care provider will let you know:  When to take oxygen.  For how long to take oxygen.  How quickly oxygen should be delivered (flow rate), in liters per minute (LPM or L/M). Home oxygen can be given through:  A mask.  A nasal cannula. This is a device or tube that goes in the nostrils.  A transtracheal catheter. This is a small, flexible tube placed in the trachea.  A tracheostomy. This is a surgically made opening in the trachea. These devices are connected with tubing to an oxygen source, such as:  A tank. Tanks hold oxygen in gas form. They must be replaced when the oxygen is used up.  A liquid oxygen device. This holds oxygen in liquid form. It must be replaced when the oxygen is used up.  An oxygen  concentrator machine. This filters oxygen in the room. It uses electricity, so you must have a backup cylinder of oxygen in case the power goes out. Supplies needed: To use oxygen, you will need:  A mask, nasal cannula, transtracheal catheter, or tracheostomy.  An oxygen tank, a liquid oxygen device, or an oxygen concentrator.  The tape that your health care provider recommends (optional). If you use a transtracheal catheter and your prescribed flow rate is 1 LPM or greater, you will also need a humidifier. Risks and complications  Fire. This can happen if the oxygen is exposed to a heat source, flame, or spark.  Injury to skin. This can happen if liquid oxygen touches your skin.  Organ damage. This can happen if you get too little oxygen. How to use oxygen Your health care provider or a representative from your [facility] will show you how to use  your oxygen device. Follow her or his instructions. The instructions may look something like this: 1. Wash your hands. 2. If you use an oxygen concentrator, make sure it is plugged in. 3. Place one end of the tube into the port on the tank, device, or machine. 4. Place the mask over your nose and mouth. Or, place the nasal cannula and secure it with tape if instructed. If you use a tracheostomy or transtracheal catheter, connect it to the oxygen source as directed. 5. Make sure the liter-flow setting on the machine is at the level prescribed by your health care provider. 6. Turn on the machine or adjust the knob on the tank or device to the correct liter-flow setting. 7. When you are done, turn off and unplug the machine, or turn the knob to OFF. How to clean and care for the oxygen supplies Nasal cannula  Clean it with a warm, wet cloth daily or as needed.  Wash it with a liquid soap once a week.  Rinse it thoroughly once or twice a week.  Replace it every 2-4 weeks.  If you have an infection, such as a cold or pneumonia,  change the cannula when you get better. Mask  Replace it every 2-4 weeks.  If you have an infection, such as a cold or pneumonia, change the mask when you get better. Humidifier bottle  Wash the bottle between each refill: ? Wash it with soap and warm water. ? Rinse it thoroughly. ? Disinfect it and its top. ? Air-dry it.  Make sure it is dry before you refill it. Oxygen concentrator  Clean the air filter at least twice a week according to directions from your home medical equipment and service company.  Wipe down the cabinet every day. To do this: ? Unplug the unit. ? Wipe down the cabinet with a damp cloth. ? Dry the cabinet. Other equipment  Change any extra tubing every 1-3 months.  Follow instructions from your health care provider about taking care of any other equipment. Safety tips Fire safety tips   Keep your oxygen and oxygen supplies at least 5 ft away from sources of heat, flames, and sparks at all times.  Do not allow smoking near your oxygen. Put up "no smoking" signs in your home. Avoid smoking areas when in public.  Do not use materials that can burn (are flammable) while you use oxygen.  When you go to a restaurant with portable oxygen, ask to be seated in the nonsmoking section.  Keep a Data processing manager close by. Let your fire department know that you have oxygen in your home.  Test your home smoke detectors regularly. Traveling  Secure your oxygen tank in the vehicle so that it does not move around. Follow instructions from your medical device company about how to safely secure your tank.  Make sure you have enough oxygen for the amount of time you will be away from home.  If you are planning air travel, contact the airline to find out if they allow the use of an approved portable oxygen concentrator. You may also need documents from your health care provider and medical device company before you travel. General safety tips  If you use an oxygen  cylinder, make sure it is in a stand or secured to an object that will not move (fixed object).  If you use liquid oxygen, make sure its container is kept upright.  If you use an oxygen concentrator: ? Dance movement psychotherapist company.  Make sure you are given priority service in the event that your power goes out. ? Avoid using extension cords, if possible. Follow these instructions at home:  Use oxygen only as told by your health care provider.  Do not use alcohol or other drugs that make you relax (sedating drugs) unless instructed. They can slow down your breathing rate and make it hard to get in enough oxygen.  Know how and when to order a refill of oxygen.  Always keep a spare tank of oxygen. Plan ahead for holidays when you may not be able to get a prescription filled.  Use water-based lubricants on your lips or nostrils. Do not use oil-based products like petroleum jelly.  To prevent skin irritation on your cheeks or behind your ears, tuck some gauze under the tubing. Contact a health care provider if:  You get headaches often.  You have shortness of breath.  You have a lasting cough.  You have anxiety.  You are sleepy all the time.  You develop an illness that affects your breathing.  You cannot exercise at your regular level.  You are restless.  You have difficult or irregular breathing, and it is getting worse.  You have a fever.  You have persistent redness under your nose. Get help right away if:  You are confused.  You have blue lips or fingernails.  You are struggling to breathe. Summary  Your health care provider or a representative from your [facility] will show you how to use your oxygen device. Follow her or his instructions.  If you use an oxygen concentrator, make sure it is plugged in.  Make sure the liter-flow setting on the machine is at the level prescribed by your health care provider.  Keep your oxygen and oxygen supplies at  least 5 ft away from sources of heat, flames, and sparks at all times. This information is not intended to replace advice given to you by your health care provider. Make sure you discuss any questions you have with your health care provider. Document Revised: 03/16/2018 Document Reviewed: 04/20/2016 Elsevier Patient Education  2020 [employer].   Steps to Quit Smoking Smoking tobacco is the leading cause of preventable death. It can affect almost every organ in the body. Smoking puts you and people around you at risk for many serious, long-lasting (chronic) diseases. Quitting smoking can be hard, but it is one of the best things that you can do for your health. It is never too late to quit. How do I get ready to quit? When you decide to quit smoking, make a plan to help you succeed. Before you quit:  Pick a date to quit. Set a date within the next 2 weeks to give you time to prepare.  Write down the reasons why you are quitting. Keep this list in places where you will see it often.  Tell your family, friends, and co-workers that you are quitting. Their support is important.  Talk with your doctor about the choices that may help you quit.  Find out if your health insurance will pay for these treatments.  Know the people, places, things, and activities that make you want to smoke (triggers). Avoid them. What first steps can I take to quit smoking?  Throw away all cigarettes at home, at work, and in your car.  Throw away the things that you use when you smoke, such as ashtrays and lighters.  Clean your car. Make sure to empty the ashtray.  Clean your home, including curtains and carpets. What can I do to help me quit smoking? Talk with your doctor about taking medicines and seeing a counselor at the same time. You are more likely to succeed when you do both.  If you are pregnant or breastfeeding, talk with your doctor about counseling or other ways to quit smoking. Do not take  medicine to help you quit smoking unless your doctor tells you to do so. To quit smoking: Quit right away  Quit smoking totally, instead of slowly cutting back on how much you smoke over a period of time.  Go to counseling. You are more likely to quit if you go to counseling sessions regularly. Take medicine You may take medicines to help you quit. Some medicines need a prescription, and some you can buy over-the-counter. Some medicines may contain a drug called nicotine to replace the nicotine in cigarettes. Medicines may:  Help you to stop having the desire to smoke (cravings).  Help to stop the problems that come when you stop smoking (withdrawal symptoms). Your doctor may ask you to use:  Nicotine patches, gum, or lozenges.  Nicotine inhalers or sprays.  Non-nicotine medicine that is taken by mouth. Find resources Find resources and other ways to help you quit smoking and remain smoke-free after you quit. These resources are most helpful when you use them often. They include:  Online chats with a Social worker.  Phone quitlines.  Printed Furniture conservator/restorer.  Support groups or group counseling.  Text messaging programs.  Mobile phone apps. Use apps on your mobile phone or tablet that can help you stick to your quit plan. There are many free apps for mobile phones and tablets as well as websites. Examples include Quit Guide from the [employer] and smokefree.gov  What things can I do to make it easier to quit?   Talk to your family and friends. Ask them to support and encourage you.  Call a phone quitline (1-800-QUIT-NOW), reach out to support groups, or work with a Social worker.  Ask people who smoke to not smoke around you.  Avoid places that make you want to smoke, such as: ? Bars. ? Parties. ? Smoke-break areas at work.  Spend time with people who do not smoke.  Lower the stress in your life. Stress can make you want to smoke. Try these things to help your  stress: ? Getting regular exercise. ? Doing deep-breathing exercises. ? Doing yoga. ? Meditating. ? Doing a body scan. To do this, close your eyes, focus on one area of your body at a time from head to toe. Notice which parts of your body are tense. Try to relax the muscles in those areas. How will I feel when I quit smoking? Day 1 to 3 weeks Within the first 24 hours, you may start to have some problems that come from quitting tobacco. These problems are very bad 2-3 days after you quit, but they do not often last for more than 2-3 weeks. You may get these symptoms:  Mood swings.  Feeling restless, nervous, angry, or annoyed.  Trouble concentrating.  Dizziness.  Strong desire for high-sugar foods and nicotine.  Weight gain.  Trouble pooping (constipation).  Feeling like you may vomit (nausea).  Coughing or a sore throat.  Changes in how the medicines that you take for other issues work in your body.  Depression.  Trouble sleeping (insomnia). Week 3 and afterward After the first 2-3 weeks of quitting, you may start to notice  more positive results, such as:  Better sense of smell and taste.  Less coughing and sore throat.  Slower heart rate.  Lower blood pressure.  Clearer skin.  Better breathing.  Fewer sick days. Quitting smoking can be hard. Do not give up if you fail the first time. Some people need to try a few times before they succeed. Do your best to stick to your quit plan, and talk with your doctor if you have any questions or concerns. Summary  Smoking tobacco is the leading cause of preventable death. Quitting smoking can be hard, but it is one of the best things that you can do for your health.  When you decide to quit smoking, make a plan to help you succeed.  Quit smoking right away, not slowly over a period of time.  When you start quitting, seek help from your doctor, family, or friends. This information is not intended to replace advice  given to you by your health care provider. Make sure you discuss any questions you have with your health care provider. Document Revised: 06/22/2019 Document Reviewed: 12/16/2018 Elsevier Patient Education  [place].

## 2020-10-09 NOTE — Assessment & Plan Note (Addendum)
-   Current 1/2 ppd smoker - Discussed smoking cessation at length > 5-8 min - Following with LDCT annually, lung RADS 2

## 2020-10-15 DIAGNOSIS — J449 Chronic obstructive pulmonary disease, unspecified: Secondary | ICD-10-CM | POA: Diagnosis not present

## 2020-10-29 ENCOUNTER — Ambulatory Visit: Payer: Medicare Other | Admitting: Family Medicine

## 2020-11-02 ENCOUNTER — Other Ambulatory Visit: Payer: Self-pay | Admitting: Nurse Practitioner

## 2020-11-02 ENCOUNTER — Other Ambulatory Visit: Payer: Self-pay | Admitting: Hematology

## 2020-11-02 DIAGNOSIS — E2839 Other primary ovarian failure: Secondary | ICD-10-CM

## 2020-11-02 DIAGNOSIS — E611 Iron deficiency: Secondary | ICD-10-CM

## 2020-11-02 NOTE — Progress Notes (Deleted)
[facility]   Telephone:([phone] Fax:(336) [phone]   Clinic Follow up Note   Patient Care Team: Monica Grippe, MD as PCP - General (Internal Medicine) Monica Kelp, MD as Consulting Physician (General Surgery) Monica Mood, MD as Consulting Physician (Hematology) Monica Puffer, MD as Consulting Physician (Radiation Oncology) Monica Socks, NP as Nurse Practitioner (Hematology and Oncology) 11/02/2020  CHIEF COMPLAINT: Follow up left breast cancer, abnormal CBC, and iron deficiency    SUMMARY OF ONCOLOGIC HISTORY: Oncology History Overview Note  Cancer Staging Malignant neoplasm of upper-inner quadrant of left breast in female, estrogen receptor positive (HCC) Staging form: Breast, AJCC 8th Edition - Clinical stage from 02/16/2017: Stage IA (cT1b, cN0, cM0, G2, ER: Positive, PR: Positive, HER2: Negative) - Unsigned Staging comments: Staged at breast conference  - Pathologic stage from 03/16/2017: Stage IA (pT1b, pN0, cM0, G2, ER: Positive, PR: Positive, HER2: Negative, Oncotype DX score: 27) - Signed by Monica Mood, MD on 04/24/2017     Malignant neoplasm of upper-inner quadrant of left breast in female, estrogen receptor positive (HCC)  02/09/2017 Initial Diagnosis   Malignant neoplasm of upper-inner quadrant of left breast in female, estrogen receptor positive (HCC)   02/09/2017 Initial Biopsy   Diagnosis Breast, left, needle core biopsy - INVASIVE DUCTAL CARCINOMA, 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 Derrell [name]    03/16/2017 Pathology Results   PATHOLOGY REPORT Diagnosis 03/16/17 1. Breast, lumpectomy, Left - INVASIVE DUCTAL  CARCINOMA, NOTTINGHAM GRADE 2 OF 3, 0.9 CM - DUCTAL CARCINOMA IN SITU - MARGINS UNINVOLVED BY CARCINOMA (0.3 CM POSTERIOR MARGIN) - PREVIOUS BIOPSY SITE CHANGES - SEE ONCOLOGY TABLE BELOW 2. Lymph node, sentinel, biopsy, Left axillary #1 - NO CARCINOMA IDENTIFIED IN ONE LYMPH NODE (0/1) 3. Lymph node, sentinel, biopsy, Left axillary #2 - NO CARCINOMA IDENTIFIED IN ONE LYMPH NODE (0/1) 4. Lymph node, sentinel, biopsy, Left axillary #3 - NO CARCINOMA IDENTIFIED IN ONE LYMPH NODE (0/1) 5. Lymph node, sentinel, biopsy, Left axillary #4 - NO CARCINOMA IDENTIFIED IN ONE LYMPH NODE (0/1)    03/16/2017 Oncotype testing   Recurrance score of 27 with a 18% distance recurrance in the net 10 years with Tamoxifen alone    05/19/2017 - 06/16/2017 Radiation Therapy   Radiation with Dr. Mitzi [name]   06/2017 -  Anti-estrogen oral therapy   anastrozole 1 mg once a day starting late 06/2017     04/29/2020 Pathology Results   DIAGNOSIS:   BONE MARROW, ASPIRATE, CLOT, CORE:  -  Cellular marrow with trilineage hematopoiesis  -  No morphologic evidence of carcinoma or hematopoietic neoplasm  -  See microscopic description below   PERIPHERAL BLOOD:  -  Polycythemia and thrombocytopenia  -  See complete blood cell count   MICROSCOPIC DESCRIPTION:   PERIPHERAL BLOOD SMEAR: The peripheral blood has a polycythemia and  thrombocytopenia.  Leukocytes are morphologically unremarkable.   BONE MARROW ASPIRATE: Spicular, cellular and adequate for evaluation  Erythroid precursors: Orderly maturation without overt dysplasia  Granulocytic precursors: Orderly maturation without overt dysplasia  Megakaryocytes: Qualitatively and quantitatively unremarkable  Lymphocytes/plasma cells: No lymphocytosis or plasmacytosis      CURRENT THERAPY: Anastrozole  INTERVAL HISTORY: Monica [name] returns for follow up as  scheduled. She was last seen 04/2020.    REVIEW OF SYSTEMS:   Constitutional: Denies fevers, chills or abnormal  weight loss Eyes: Denies blurriness of vision Ears, nose, mouth, throat, and face: Denies mucositis or sore throat Respiratory: Denies cough, dyspnea or wheezes Cardiovascular: Denies palpitation, chest discomfort or lower extremity swelling Gastrointestinal:  Denies nausea, heartburn or change in bowel habits Skin: Denies abnormal skin rashes Lymphatics: Denies new lymphadenopathy or easy bruising Neurological:Denies numbness, tingling or new weaknesses Behavioral/Psych: [name] is stable, no new changes  All other systems were reviewed with the patient and are negative.  MEDICAL HISTORY:  Past Medical History:  Diagnosis Date  . CAD (coronary artery disease)    Cypher stent 09/2002  . Cancer [facility]) 2018   left breast, radiation therapy  . CHF (congestive heart failure) ([place])   . Depression   . Diabetes mellitus ([place])   . Hyperlipidemia   . Hypertension   . Myocardial infarction ([place])    2003,went into cardiac shock  . Osteoporosis   . Seizures ([place])    1st seizure age late 30's; most recent 07/28/17  . Sinusitis     SURGICAL HISTORY: Past Surgical History:  Procedure Laterality Date  . ABDOMINAL HYSTERECTOMY    . APPENDECTOMY    . BREAST LUMPECTOMY WITH RADIOACTIVE SEED AND SENTINEL LYMPH NODE BIOPSY Left 03/16/2017   Procedure: INJECT BLUE DYE LEFT BREAST, LEFT BREAST LUMPECTOMY WITH RADIOACTIVE SEED AND LEFT AXILLARY DEEP SENTINEL LYMPH NODE  BIOPSY ADJACENT TISSUE TRANSFER;  Surgeon: [name], MD;  Location: [place];  Service: General;  Laterality: Left;  . CARPAL TUNNEL RELEASE Bilateral   . COLONOSCOPY WITH PROPOFOL N/A 05/30/2015   Procedure: COLONOSCOPY WITH PROPOFOL;  Surgeon: Carol Ada, MD;  [facility]: [facility];  Service: Endoscopy;  Laterality: N/A;  . COLONOSCOPY WITH PROPOFOL N/A 06/09/2018   Procedure: COLONOSCOPY WITH PROPOFOL;  Surgeon: Carol Ada, MD;  [facility];  Service: Endoscopy;  Laterality: N/A;  . CORONARY ANGIOPLASTY WITH STENT  PLACEMENT  10-02-2002  . Exploratory abdominal     Bleeding in abdomen after tubal  . POLYPECTOMY  06/09/2018   Procedure: POLYPECTOMY;  Surgeon: Carol Ada, MD;  [facility];  Service: Endoscopy;;  . TUBAL LIGATION    . UMBILICAL HERNIA REPAIR      I have reviewed the social history and family history with the patient and they are unchanged from previous note.  ALLERGIES:  is allergic to ace inhibitors, keflex [cephalexin], phenobarbital, sulfate, and altace [ramipril].  MEDICATIONS:  Current Outpatient Medications  Medication Sig Dispense Refill  . acetaminophen (TYLENOL) 500 MG tablet Take 1,000 mg by mouth every 6 (six) hours as needed for moderate pain or headache.    . albuterol (VENTOLIN HFA) 108 (90 Base) MCG/ACT inhaler Inhale 2 puffs into the lungs every 6 (six) hours as needed for wheezing or shortness of breath. 8 g 6  . anastrozole (ARIMIDEX) 1 MG tablet Take 1 tablet (1 mg total) by mouth daily. 90 tablet 1  . aspirin EC 81 MG tablet Take 81 mg by mouth at bedtime.    Marland Kitchen atorvastatin (LIPITOR) 40 MG tablet Take 40 mg by mouth at bedtime.    . B-D ULTRAFINE III SHORT PEN 31G X 8 MM MISC Inject into the skin as directed.    . Calcium Carbonate-Vitamin D 600-400 MG-UNIT per tablet Take 1 tablet by mouth daily.     . carvedilol (COREG) 3.125 MG tablet Take 3.125 mg by mouth 2 (two)  times daily.     . clonazePAM (KLONOPIN) 1 MG tablet Take 1 mg by mouth at bedtime.     . Fluticasone-Umeclidin-Vilant (TRELEGY ELLIPTA) 100-62.5-25 MCG/INH AEPB Inhale 1 puff then rinse mouth, once daily 60 each 12  . glimepiride (AMARYL) 4 MG tablet Take 4 mg by mouth 2 (two) times daily.    . halobetasol (ULTRAVATE) 0.05 % cream Apply 1 application topically 2 (two) times daily as needed (for rash).     . Insulin Detemir (LEVEMIR) 100 UNIT/ML Pen Inject 17-25 Units into the skin See admin instructions. Inject 25 units SQ in the morning and inject 17 units SQ at night    . insulin  lispro (HUMALOG) 100 UNIT/ML injection Inject 7 Units into the skin 2 (two) times daily with breakfast and lunch.     . insulin lispro (HUMALOG) 100 UNIT/ML injection Inject 9 Units into the skin at bedtime.    . levETIRAcetam (KEPPRA) 500 MG tablet Take 1 tablet (500 mg total) by mouth 2 (two) times daily. 180 tablet 3  . losartan (COZAAR) 50 MG tablet Take 50 mg by mouth at bedtime.     . Multiple Vitamin (MULTIVITAMIN WITH MINERALS) TABS tablet Take 1 tablet by mouth at bedtime.    . nitroGLYCERIN (NITROSTAT) 0.4 MG SL tablet Place 1 tablet (0.4 mg total) under the tongue every 5 (five) minutes as needed for chest pain. 25 tablet 1  . Omega-3 Fatty Acids (FISH OIL) 1200 MG CAPS Take 2,400 mg by mouth 2 (two) times daily.    Marland Kitchen omeprazole (PRILOSEC) 20 MG capsule Take 20 mg by mouth at bedtime.     Glory Rosebush VERIO test strip USE THREE TIMES A DAY AND AS NEEDED FOR HYPOGLYCEMIA    . pioglitazone (ACTOS) 30 MG tablet Take 30 mg by mouth at bedtime.    Vladimir Faster Glycol-Propyl Glycol 0.4-0.3 % SOLN Place 1 drop into both eyes 2 (two) times daily.    . risedronate (ACTONEL) 150 MG tablet Take 150 mg by mouth every 30 (thirty) days.    Marland Kitchen sertraline (ZOLOFT) 100 MG tablet Take 100 mg by mouth at bedtime.    . TRIJARDY XR 12.5-2.02-999 MG TB24 Take 1 tablet by mouth 2 (two) times daily.     No current facility-administered medications for this visit.    PHYSICAL EXAMINATION: ECOG PERFORMANCE STATUS: {CHL ONC ECOG [phone]}  There were no vitals filed for this visit. There were no vitals filed for this visit.  GENERAL:alert, no distress and comfortable SKIN: skin color, texture, turgor are normal, no rashes or significant lesions EYES: normal, Conjunctiva are pink and non-injected, sclera clear OROPHARYNX:no exudate, no erythema and lips, buccal mucosa, and tongue normal  NECK: supple, thyroid normal size, non-tender, without nodularity LYMPH:  no palpable lymphadenopathy in the  cervical, axillary or inguinal LUNGS: clear to auscultation and percussion with normal breathing effort HEART: regular rate & rhythm and no murmurs and no lower extremity edema ABDOMEN:abdomen soft, non-tender and normal bowel sounds Musculoskeletal:no cyanosis of digits and no clubbing  NEURO: alert & oriented x 3 with fluent speech, no focal motor/sensory deficits  LABORATORY DATA:  I have reviewed the data as listed CBC Latest Ref Rng & Units 04/29/2020 02/12/2020 02/12/2020  WBC 4.0 - 10.5 K/uL 5.8 7.1 -  Hemoglobin 12.0 - 15.0 g/dL 16.2(H) 16.6(H) -  Hematocrit 36.0 - 46.0 % 49.3(H) 51.9(H) 51.6(H)  Platelets 150 - 400 K/uL 107(L) 126(L) -     CMP Latest Ref Rng &  Units 02/12/2020 12/26/2019 06/27/2019  Glucose 70 - 99 mg/dL 177(H) 281(H) 174(H)  BUN 8 - 23 mg/dL $Remove'14 21 12  'uCyepsw$ Creatinine 0.44 - 1.00 mg/dL 0.71 0.78 0.71  Sodium 135 - 145 mmol/L 141 136 139  Potassium 3.5 - 5.1 mmol/L 4.2 4.4 4.0  Chloride 98 - 111 mmol/L 106 103 102  CO2 22 - 32 mmol/L $RemoveB'23 24 29  'qpjWqPWK$ Calcium 8.9 - 10.3 mg/dL 9.0 9.5 9.8  Total Protein 6.5 - 8.1 g/dL 7.4 7.3 7.1  Total Bilirubin 0.3 - 1.2 mg/dL 0.4 0.5 0.5  Alkaline Phos 38 - 126 U/L 76 73 58  AST 15 - 41 U/L $Remo'20 18 26  'SFalL$ ALT 0 - 44 U/L $Remo'22 23 25      'vYnFG$ RADIOGRAPHIC STUDIES: I have personally reviewed the radiological images as listed and agreed with the findings in the report. No results found.   ASSESSMENT & PLAN:  No problem-specific Assessment & Plan notes found for this encounter.   No orders of the defined types were placed in this encounter.  All questions were answered. The patient knows to call the clinic with any problems, questions or concerns. No barriers to learning was detected. I spent {CHL ONC TIME VISIT - UMPNT:[id number]} counseling the patient face to face. The total time spent in the appointment was {CHL ONC TIME VISIT - QQPYP:[id number]} and more than 50% was on counseling and review of test results     Monica Feeling, NP 11/02/20

## 2020-11-03 ENCOUNTER — Inpatient Hospital Stay: Payer: Medicare Other | Admitting: Nurse Practitioner

## 2020-11-03 ENCOUNTER — Inpatient Hospital Stay: Payer: Medicare Other

## 2020-11-04 ENCOUNTER — Telehealth: Payer: Self-pay | Admitting: Nurse Practitioner

## 2020-11-04 NOTE — Telephone Encounter (Signed)
Rescheduled appt per 1/24 sch msg- pt is aware of apt date and time

## 2020-11-12 NOTE — Progress Notes (Deleted)
[place]   Telephone:(336) [phone] Fax:(336) [phone]   Clinic Follow up Note   Patient Care Team: Jani Gravel, MD as PCP - General (Internal Medicine) [name], MD as Consulting Physician (General Surgery) Truitt Merle, MD as Consulting Physician (Hematology) Kyung Rudd, MD as Consulting Physician (Radiation Oncology) Gardenia Phlegm, NP as Nurse Practitioner (Hematology and Oncology) 11/12/2020  CHIEF COMPLAINT: Follow up left breast cancer   SUMMARY OF ONCOLOGIC HISTORY: Oncology History Overview Note  Cancer Staging Malignant neoplasm of upper-inner quadrant of left breast in female, estrogen receptor positive ([place]) Staging form: Breast, AJCC 8th Edition - Clinical stage from 02/16/2017: Stage IA (cT1b, cN0, cM0, G2, ER: Positive, PR: Positive, HER2: Negative) - Unsigned Staging comments: Staged at breast conference  - Pathologic stage from 03/16/2017: Stage IA (pT1b, pN0, cM0, G2, ER: Positive, PR: Positive, HER2: Negative, Oncotype DX score: 27) - Signed by Truitt Merle, MD on 04/24/2017     Malignant neoplasm of upper-inner quadrant of left breast in female, estrogen receptor positive ([place])  02/09/2017 Initial Diagnosis   Malignant neoplasm of upper-inner quadrant of left breast in female, estrogen receptor positive ([place])   02/09/2017 Initial Biopsy   Diagnosis Breast, left, needle core biopsy - INVASIVE DUCTAL CARCINOMA, 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 Dalbert Batman    03/16/2017 Pathology Results   PATHOLOGY REPORT Diagnosis 03/16/17 1. Breast, lumpectomy, Left - INVASIVE DUCTAL CARCINOMA, NOTTINGHAM GRADE 2 OF 3,  0.9 CM - DUCTAL CARCINOMA IN SITU - MARGINS UNINVOLVED BY CARCINOMA (0.3 CM POSTERIOR MARGIN) - PREVIOUS BIOPSY SITE CHANGES - SEE ONCOLOGY TABLE BELOW 2. Lymph node, sentinel, biopsy, Left axillary #1 - NO CARCINOMA IDENTIFIED IN ONE LYMPH NODE (0/1) 3. Lymph node, sentinel, biopsy, Left axillary #2 - NO CARCINOMA IDENTIFIED IN ONE LYMPH NODE (0/1) 4. Lymph node, sentinel, biopsy, Left axillary #3 - NO CARCINOMA IDENTIFIED IN ONE LYMPH NODE (0/1) 5. Lymph node, sentinel, biopsy, Left axillary #4 - NO CARCINOMA IDENTIFIED IN ONE LYMPH NODE (0/1)    03/16/2017 Oncotype testing   Recurrance score of 27 with a 18% distance recurrance in the net 10 years with Tamoxifen alone    05/19/2017 - 06/16/2017 Radiation Therapy   Radiation with Dr. Lisbeth Renshaw   06/2017 -  Anti-estrogen oral therapy   anastrozole 1 mg once a day starting late 06/2017     04/29/2020 Pathology Results   DIAGNOSIS:   BONE MARROW, ASPIRATE, CLOT, CORE:  -  Cellular marrow with trilineage hematopoiesis  -  No morphologic evidence of carcinoma or hematopoietic neoplasm  -  See microscopic description below   PERIPHERAL BLOOD:  -  Polycythemia and thrombocytopenia  -  See complete blood cell count   MICROSCOPIC DESCRIPTION:   PERIPHERAL BLOOD SMEAR: The peripheral blood has a polycythemia and  thrombocytopenia.  Leukocytes are morphologically unremarkable.   BONE MARROW ASPIRATE: Spicular, cellular and adequate for evaluation  Erythroid precursors: Orderly maturation without overt dysplasia  Granulocytic precursors: Orderly maturation without overt dysplasia  Megakaryocytes: Qualitatively and quantitatively unremarkable  Lymphocytes/plasma cells: No lymphocytosis or plasmacytosis      CURRENT THERAPY: Anastrozole 1 mg once daily starting 06/2017   INTERVAL HISTORY: Monica [name] returns for follow up  as scheduled. She was last seen 05/05/20.    REVIEW OF SYSTEMS:   Constitutional: Denies fevers, chills or abnormal  weight loss Eyes: Denies blurriness of vision Ears, nose, mouth, throat, and face: Denies mucositis or sore throat Respiratory: Denies cough, dyspnea or wheezes Cardiovascular: Denies palpitation, chest discomfort or lower extremity swelling Gastrointestinal:  Denies nausea, heartburn or change in bowel habits Skin: Denies abnormal skin rashes Lymphatics: Denies new lymphadenopathy or easy bruising Neurological:Denies numbness, tingling or new weaknesses Behavioral/Psych: Mood is stable, no new changes  All other systems were reviewed with the patient and are negative.  MEDICAL HISTORY:  Past Medical History:  Diagnosis Date  . CAD (coronary artery disease)    Cypher stent 09/2002  . Cancer [facility]) 2018   left breast, radiation therapy  . CHF (congestive heart failure) ([place])   . Depression   . Diabetes mellitus ([place])   . Hyperlipidemia   . Hypertension   . Myocardial infarction ([place])    2003,went into cardiac shock  . Osteoporosis   . Seizures ([place])    1st seizure age late 48's; most recent 07/28/17  . Sinusitis     SURGICAL HISTORY: Past Surgical History:  Procedure Laterality Date  . ABDOMINAL HYSTERECTOMY    . APPENDECTOMY    . BREAST LUMPECTOMY WITH RADIOACTIVE SEED AND SENTINEL LYMPH NODE BIOPSY Left 03/16/2017   Procedure: INJECT BLUE DYE LEFT BREAST, LEFT BREAST LUMPECTOMY WITH RADIOACTIVE SEED AND LEFT AXILLARY DEEP SENTINEL LYMPH NODE  BIOPSY ADJACENT TISSUE TRANSFER;  Surgeon: [name], MD;  Location: [place];  Service: General;  Laterality: Left;  . CARPAL TUNNEL RELEASE Bilateral   . COLONOSCOPY WITH PROPOFOL N/A 05/30/2015   Procedure: COLONOSCOPY WITH PROPOFOL;  Surgeon: Carol Ada, MD;  [facility];  Service: Endoscopy;  Laterality: N/A;  . COLONOSCOPY WITH PROPOFOL N/A 06/09/2018   Procedure: COLONOSCOPY WITH PROPOFOL;  Surgeon: Carol Ada, MD;  [facility];  Service: Endoscopy;  Laterality: N/A;  . CORONARY ANGIOPLASTY WITH STENT  PLACEMENT  10-02-2002  . Exploratory abdominal     Bleeding in abdomen after tubal  . POLYPECTOMY  06/09/2018   Procedure: POLYPECTOMY;  Surgeon: Carol Ada, MD;  [facility];  Service: Endoscopy;;  . TUBAL LIGATION    . UMBILICAL HERNIA REPAIR      I have reviewed the social history and family history with the patient and they are unchanged from previous note.  ALLERGIES:  is allergic to ace inhibitors, keflex [cephalexin], phenobarbital, sulfate, and altace [ramipril].  MEDICATIONS:  Current Outpatient Medications  Medication Sig Dispense Refill  . acetaminophen (TYLENOL) 500 MG tablet Take 1,000 mg by mouth every 6 (six) hours as needed for moderate pain or headache.    . albuterol (VENTOLIN HFA) 108 (90 Base) MCG/ACT inhaler Inhale 2 puffs into the lungs every 6 (six) hours as needed for wheezing or shortness of breath. 8 g 6  . anastrozole (ARIMIDEX) 1 MG tablet Take 1 tablet by mouth once daily 90 tablet 0  . aspirin EC 81 MG tablet Take 81 mg by mouth at bedtime.    Marland Kitchen atorvastatin (LIPITOR) 40 MG tablet Take 40 mg by mouth at bedtime.    . B-D ULTRAFINE III SHORT PEN 31G X 8 MM MISC Inject into the skin as directed.    . Calcium Carbonate-Vitamin D 600-400 MG-UNIT per tablet Take 1 tablet by mouth daily.     . carvedilol (COREG) 3.125 MG tablet Take 3.125 mg by mouth 2 (two) times  daily.     . clonazePAM (KLONOPIN) 1 MG tablet Take 1 mg by mouth at bedtime.     . Fluticasone-Umeclidin-Vilant (TRELEGY ELLIPTA) 100-62.5-25 MCG/INH AEPB Inhale 1 puff then rinse mouth, once daily 60 each 12  . glimepiride (AMARYL) 4 MG tablet Take 4 mg by mouth 2 (two) times daily.    . halobetasol (ULTRAVATE) 0.05 % cream Apply 1 application topically 2 (two) times daily as needed (for rash).     . Insulin Detemir (LEVEMIR) 100 UNIT/ML Pen Inject 17-25 Units into the skin See admin instructions. Inject 25 units SQ in the morning and inject 17 units SQ at night    . insulin lispro  (HUMALOG) 100 UNIT/ML injection Inject 7 Units into the skin 2 (two) times daily with breakfast and lunch.     . insulin lispro (HUMALOG) 100 UNIT/ML injection Inject 9 Units into the skin at bedtime.    . levETIRAcetam (KEPPRA) 500 MG tablet Take 1 tablet (500 mg total) by mouth 2 (two) times daily. 180 tablet 3  . losartan (COZAAR) 50 MG tablet Take 50 mg by mouth at bedtime.     . Multiple Vitamin (MULTIVITAMIN WITH MINERALS) TABS tablet Take 1 tablet by mouth at bedtime.    . nitroGLYCERIN (NITROSTAT) 0.4 MG SL tablet Place 1 tablet (0.4 mg total) under the tongue every 5 (five) minutes as needed for chest pain. 25 tablet 1  . Omega-3 Fatty Acids (FISH OIL) 1200 MG CAPS Take 2,400 mg by mouth 2 (two) times daily.    Marland Kitchen omeprazole (PRILOSEC) 20 MG capsule Take 20 mg by mouth at bedtime.     Glory Rosebush VERIO test strip USE THREE TIMES A DAY AND AS NEEDED FOR HYPOGLYCEMIA    . pioglitazone (ACTOS) 30 MG tablet Take 30 mg by mouth at bedtime.    Vladimir Faster Glycol-Propyl Glycol 0.4-0.3 % SOLN Place 1 drop into both eyes 2 (two) times daily.    . risedronate (ACTONEL) 150 MG tablet Take 150 mg by mouth every 30 (thirty) days.    Marland Kitchen sertraline (ZOLOFT) 100 MG tablet Take 100 mg by mouth at bedtime.    . TRIJARDY XR 12.5-2.02-999 MG TB24 Take 1 tablet by mouth 2 (two) times daily.     No current facility-administered medications for this visit.    PHYSICAL EXAMINATION: ECOG PERFORMANCE STATUS: {CHL ONC ECOG [phone]}  There were no vitals filed for this visit. There were no vitals filed for this visit.  GENERAL:alert, no distress and comfortable SKIN: skin color, texture, turgor are normal, no rashes or significant lesions EYES: normal, Conjunctiva are pink and non-injected, sclera clear OROPHARYNX:no exudate, no erythema and lips, buccal mucosa, and tongue normal  NECK: supple, thyroid normal size, non-tender, without nodularity LYMPH:  no palpable lymphadenopathy in the cervical,  axillary or inguinal LUNGS: clear to auscultation and percussion with normal breathing effort HEART: regular rate & rhythm and no murmurs and no lower extremity edema ABDOMEN:abdomen soft, non-tender and normal bowel sounds Musculoskeletal:no cyanosis of digits and no clubbing  NEURO: alert & oriented x 3 with fluent speech, no focal motor/sensory deficits  LABORATORY DATA:  I have reviewed the data as listed CBC Latest Ref Rng & Units 04/29/2020 02/12/2020 02/12/2020  WBC 4.0 - 10.5 K/uL 5.8 7.1 -  Hemoglobin 12.0 - 15.0 g/dL 16.2(H) 16.6(H) -  Hematocrit 36.0 - 46.0 % 49.3(H) 51.9(H) 51.6(H)  Platelets 150 - 400 K/uL 107(L) 126(L) -     CMP Latest Ref Rng &  Units 02/12/2020 12/26/2019 06/27/2019  Glucose 70 - 99 mg/dL 177(H) 281(H) 174(H)  BUN 8 - 23 mg/dL $Remove'14 21 12  'hLnWILe$ Creatinine 0.44 - 1.00 mg/dL 0.71 0.78 0.71  Sodium 135 - 145 mmol/L 141 136 139  Potassium 3.5 - 5.1 mmol/L 4.2 4.4 4.0  Chloride 98 - 111 mmol/L 106 103 102  CO2 22 - 32 mmol/L $RemoveB'23 24 29  'rNTVthrF$ Calcium 8.9 - 10.3 mg/dL 9.0 9.5 9.8  Total Protein 6.5 - 8.1 g/dL 7.4 7.3 7.1  Total Bilirubin 0.3 - 1.2 mg/dL 0.4 0.5 0.5  Alkaline Phos 38 - 126 U/L 76 73 58  AST 15 - 41 U/L $Remo'20 18 26  'MTHPX$ ALT 0 - 44 U/L $Remo'22 23 25      'Werpl$ RADIOGRAPHIC STUDIES: I have personally reviewed the radiological images as listed and agreed with the findings in the report. No results found.   ASSESSMENT & PLAN:  No problem-specific Assessment & Plan notes found for this encounter.   No orders of the defined types were placed in this encounter.  All questions were answered. The patient knows to call the clinic with any problems, questions or concerns. No barriers to learning was detected. I spent {CHL ONC TIME VISIT - XTKWI:[id number]} counseling the patient face to face. The total time spent in the appointment was {CHL ONC TIME VISIT - EQAST:[id number]} and more than 50% was on counseling and review of test results     Alla Feeling, NP 11/12/20

## 2020-11-13 ENCOUNTER — Inpatient Hospital Stay: Payer: Medicare Other

## 2020-11-13 ENCOUNTER — Inpatient Hospital Stay: Payer: Medicare Other | Admitting: Nurse Practitioner

## 2020-11-15 DIAGNOSIS — J449 Chronic obstructive pulmonary disease, unspecified: Secondary | ICD-10-CM | POA: Diagnosis not present

## 2020-11-17 ENCOUNTER — Telehealth: Payer: Self-pay | Admitting: Nurse Practitioner

## 2020-11-17 NOTE — Telephone Encounter (Signed)
Rescheduled appt per 2/7 sch msg - pt is aware of new appt date and time

## 2020-11-19 ENCOUNTER — Inpatient Hospital Stay: Payer: Medicare Other | Admitting: Nurse Practitioner

## 2020-11-19 ENCOUNTER — Inpatient Hospital Stay: Payer: Medicare Other

## 2020-11-27 ENCOUNTER — Telehealth: Payer: Self-pay | Admitting: Nurse Practitioner

## 2020-11-27 NOTE — Telephone Encounter (Signed)
Changed provider appointment to virtual per provider's request. Patient is aware of changes.

## 2020-12-02 ENCOUNTER — Inpatient Hospital Stay (HOSPITAL_BASED_OUTPATIENT_CLINIC_OR_DEPARTMENT_OTHER): Payer: Medicare Other | Admitting: Nurse Practitioner

## 2020-12-02 ENCOUNTER — Other Ambulatory Visit: Payer: Self-pay

## 2020-12-02 ENCOUNTER — Telehealth: Payer: Self-pay

## 2020-12-02 ENCOUNTER — Telehealth: Payer: Self-pay | Admitting: Nurse Practitioner

## 2020-12-02 ENCOUNTER — Encounter: Payer: Self-pay | Admitting: Nurse Practitioner

## 2020-12-02 ENCOUNTER — Inpatient Hospital Stay: Payer: Medicare Other | Attending: Nurse Practitioner

## 2020-12-02 DIAGNOSIS — Z79811 Long term (current) use of aromatase inhibitors: Secondary | ICD-10-CM | POA: Diagnosis not present

## 2020-12-02 DIAGNOSIS — Z794 Long term (current) use of insulin: Secondary | ICD-10-CM | POA: Insufficient documentation

## 2020-12-02 DIAGNOSIS — C50212 Malignant neoplasm of upper-inner quadrant of left female breast: Secondary | ICD-10-CM | POA: Diagnosis not present

## 2020-12-02 DIAGNOSIS — D696 Thrombocytopenia, unspecified: Secondary | ICD-10-CM

## 2020-12-02 DIAGNOSIS — D751 Secondary polycythemia: Secondary | ICD-10-CM | POA: Diagnosis not present

## 2020-12-02 DIAGNOSIS — I509 Heart failure, unspecified: Secondary | ICD-10-CM | POA: Insufficient documentation

## 2020-12-02 DIAGNOSIS — G4730 Sleep apnea, unspecified: Secondary | ICD-10-CM | POA: Diagnosis not present

## 2020-12-02 DIAGNOSIS — F17210 Nicotine dependence, cigarettes, uncomplicated: Secondary | ICD-10-CM | POA: Diagnosis not present

## 2020-12-02 DIAGNOSIS — J439 Emphysema, unspecified: Secondary | ICD-10-CM | POA: Diagnosis not present

## 2020-12-02 DIAGNOSIS — Z170 Estrogen receptor positive status [ER+]: Secondary | ICD-10-CM | POA: Insufficient documentation

## 2020-12-02 DIAGNOSIS — I252 Old myocardial infarction: Secondary | ICD-10-CM | POA: Diagnosis not present

## 2020-12-02 DIAGNOSIS — M85852 Other specified disorders of bone density and structure, left thigh: Secondary | ICD-10-CM | POA: Insufficient documentation

## 2020-12-02 DIAGNOSIS — E611 Iron deficiency: Secondary | ICD-10-CM | POA: Insufficient documentation

## 2020-12-02 DIAGNOSIS — Z7982 Long term (current) use of aspirin: Secondary | ICD-10-CM | POA: Insufficient documentation

## 2020-12-02 DIAGNOSIS — E119 Type 2 diabetes mellitus without complications: Secondary | ICD-10-CM | POA: Insufficient documentation

## 2020-12-02 DIAGNOSIS — Z79899 Other long term (current) drug therapy: Secondary | ICD-10-CM | POA: Diagnosis not present

## 2020-12-02 DIAGNOSIS — D509 Iron deficiency anemia, unspecified: Secondary | ICD-10-CM

## 2020-12-02 DIAGNOSIS — R918 Other nonspecific abnormal finding of lung field: Secondary | ICD-10-CM | POA: Insufficient documentation

## 2020-12-02 DIAGNOSIS — I110 Hypertensive heart disease with heart failure: Secondary | ICD-10-CM | POA: Insufficient documentation

## 2020-12-02 LAB — COMPREHENSIVE METABOLIC PANEL
ALT: 29 U/L (ref 0–44)
AST: 24 U/L (ref 15–41)
Albumin: 4.6 g/dL (ref 3.5–5.0)
Alkaline Phosphatase: 60 U/L (ref 38–126)
Anion gap: 10 (ref 5–15)
BUN: 11 mg/dL (ref 8–23)
CO2: 27 mmol/L (ref 22–32)
Calcium: 10.2 mg/dL (ref 8.9–10.3)
Chloride: 101 mmol/L (ref 98–111)
Creatinine, Ser: 0.75 mg/dL (ref 0.44–1.00)
GFR, Estimated: >60 mL/min (ref >60)
Glucose, Bld: 150 mg/dL — ABNORMAL HIGH (ref 70–99)
Potassium: 4.3 mmol/L (ref 3.5–5.1)
Sodium: 138 mmol/L (ref 135–145)
Total Bilirubin: 0.6 mg/dL (ref 0.3–1.2)
Total Protein: 7.9 g/dL (ref 6.5–8.1)

## 2020-12-02 LAB — CBC WITH DIFFERENTIAL/PLATELET
Abs Immature Granulocytes: 0.04 10*3/uL (ref 0.00–0.07)
Basophils Absolute: 0 10*3/uL (ref 0.0–0.1)
Basophils Relative: 1 %
Eosinophils Absolute: 0.2 10*3/uL (ref 0.0–0.5)
Eosinophils Relative: 2 %
HCT: 50.6 % — ABNORMAL HIGH (ref 36.0–46.0)
Hemoglobin: 16.2 g/dL — ABNORMAL HIGH (ref 12.0–15.0)
Immature Granulocytes: 1 %
Lymphocytes Relative: 24 %
Lymphs Abs: 1.9 10*3/uL (ref 0.7–4.0)
MCH: 24.7 pg — ABNORMAL LOW (ref 26.0–34.0)
MCHC: 32 g/dL (ref 30.0–36.0)
MCV: 77.1 fL — ABNORMAL LOW (ref 80.0–100.0)
Monocytes Absolute: 0.4 10*3/uL (ref 0.1–1.0)
Monocytes Relative: 6 %
Neutro Abs: 5.4 10*3/uL (ref 1.7–7.7)
Neutrophils Relative %: 66 %
Platelets: 128 10*3/uL — ABNORMAL LOW (ref 150–400)
RBC: 6.56 MIL/uL — ABNORMAL HIGH (ref 3.87–5.11)
RDW: 17.2 % — ABNORMAL HIGH (ref 11.5–15.5)
WBC: 7.9 10*3/uL (ref 4.0–10.5)
nRBC: 0 % (ref 0.0–0.2)

## 2020-12-02 LAB — IRON AND TIBC
Iron: 69 ug/dL (ref 41–142)
Saturation Ratios: 14 % — ABNORMAL LOW (ref 21–57)
TIBC: 504 ug/dL — ABNORMAL HIGH (ref 236–444)
UIBC: 435 ug/dL — ABNORMAL HIGH (ref 120–384)

## 2020-12-02 LAB — RETICULOCYTES
Immature Retic Fract: 15.4 % (ref 2.3–15.9)
RBC.: 6.56 MIL/uL — ABNORMAL HIGH (ref 3.87–5.11)
Retic Count, Absolute: 89.2 10*3/uL (ref 19.0–186.0)
Retic Ct Pct: 1.4 % (ref 0.4–3.1)

## 2020-12-02 LAB — FERRITIN: Ferritin: 10 ng/mL — ABNORMAL LOW (ref 11–307)

## 2020-12-02 NOTE — Telephone Encounter (Addendum)
Iron orders

## 2020-12-02 NOTE — Progress Notes (Signed)
[facility]   [phone] [phone]   Clinic Follow up Note   Patient Care Team: Pearson Grippe, MD as PCP - General (Internal Medicine) Claud Kelp, MD as Consulting Physician (General Surgery) Malachy Mood, MD as Consulting Physician (Hematology) Dorothy Puffer, MD as Consulting Physician (Radiation Oncology) Loa Socks, NP as Nurse Practitioner (Hematology and Oncology) 12/02/2020   I connected with Monica [name] on 12/02/20 at 11:45 AM EST by telephone visit and verified that I am speaking with the correct person using two identifiers.   I discussed the limitations, risks, security and privacy concerns of performing an evaluation and management service by telemedicine and the availability of in-person appointments. I also discussed with the patient that there may be a patient responsible charge related to this service. The patient expressed understanding and agreed to proceed.   Other persons participating in the visit and their role in the encounter: N/A  Patient's location: Home Provider's location: Home office   CHIEF COMPLAINT: Follow up left breast cancer, polycythemia   SUMMARY OF ONCOLOGIC HISTORY: Oncology History Overview Note  Cancer Staging Malignant neoplasm of upper-inner quadrant of left breast in female, estrogen receptor positive (HCC) Staging form: Breast, AJCC 8th Edition - Clinical stage from 02/16/2017: Stage IA (cT1b, cN0, cM0, G2, ER: Positive, PR: Positive, HER2: Negative) - Unsigned Staging comments: Staged at breast conference  - Pathologic stage from 03/16/2017: Stage IA (pT1b, pN0, cM0, G2, ER: Positive, PR: Positive, HER2: Negative, Oncotype DX score: 27) - Signed by Malachy Mood, MD on 04/24/2017     Malignant neoplasm of upper-inner quadrant of left breast in female, estrogen receptor positive (HCC)  02/09/2017 Initial Diagnosis   Malignant neoplasm of upper-inner quadrant of left breast in female, estrogen  receptor positive (HCC)   02/09/2017 Initial Biopsy   Diagnosis Breast, left, needle core biopsy - INVASIVE DUCTAL CARCINOMA, 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 Derrell Lolling    03/16/2017 Pathology Results   PATHOLOGY REPORT Diagnosis 03/16/17 1. Breast, lumpectomy, Left - INVASIVE DUCTAL CARCINOMA, NOTTINGHAM GRADE 2 OF 3, 0.9 CM - DUCTAL CARCINOMA IN SITU - MARGINS UNINVOLVED BY CARCINOMA (0.3 CM POSTERIOR MARGIN) - PREVIOUS BIOPSY SITE CHANGES - SEE ONCOLOGY TABLE BELOW 2. Lymph node, sentinel, biopsy, Left axillary #1 - NO CARCINOMA IDENTIFIED IN ONE LYMPH NODE (0/1) 3. Lymph node, sentinel, biopsy, Left axillary #2 - NO CARCINOMA IDENTIFIED IN ONE LYMPH NODE (0/1) 4. Lymph node, sentinel, biopsy, Left axillary #3 - NO CARCINOMA IDENTIFIED IN ONE LYMPH NODE (0/1) 5. Lymph node, sentinel, biopsy, Left axillary #4 - NO CARCINOMA IDENTIFIED IN ONE LYMPH NODE (0/1)    03/16/2017 Oncotype testing   Recurrance score of 27 with a 18% distance recurrance in the net 10 years with Tamoxifen alone    05/19/2017 - 06/16/2017 Radiation Therapy   Radiation with Dr. Mitzi Hansen   06/2017 -  Anti-estrogen oral therapy   anastrozole 1 mg once a day starting late 06/2017     04/29/2020 Pathology Results   DIAGNOSIS:   BONE MARROW, ASPIRATE, CLOT, CORE:  -  Cellular marrow with trilineage hematopoiesis  -  No morphologic evidence of carcinoma or hematopoietic neoplasm  -  See microscopic  description below   PERIPHERAL BLOOD:  -  Polycythemia and thrombocytopenia  -  See complete blood cell count   MICROSCOPIC DESCRIPTION:   PERIPHERAL BLOOD SMEAR: The peripheral  blood has a polycythemia and  thrombocytopenia.  Leukocytes are morphologically unremarkable.   BONE MARROW ASPIRATE: Spicular, cellular and adequate for evaluation  Erythroid precursors: Orderly maturation without overt dysplasia  Granulocytic precursors: Orderly maturation without overt dysplasia  Megakaryocytes: Qualitatively and quantitatively unremarkable  Lymphocytes/plasma cells: No lymphocytosis or plasmacytosis      CURRENT THERAPY: Anastrozole since 2018  INTERVAL HISTORY: Monica [name] presents virtually as scheduled. She was last seen by Dr. Mosetta Putt 04/2020. She is doing well without specific concerns. She continues smoking, 1/2 PPD. She was started on supplemental oxygen at night which is helping her sleep better. Denies concerns in her breast such as new lump/mass, nipple discharge or inversion. She has mammogram coming up in May. She continues anastrozole which she tolerates. Denies hot flashes. Minor hip pains are intermittent and stable. Denies new pain, fatigue, signs of thrombosis, bleeding, or other issues.    REVIEW OF SYSTEMS:   Constitutional: Denies fevers, chills or abnormal weight loss Eyes: Denies blurriness of vision Ears, nose, mouth, throat, and face: Denies mucositis or sore throat Respiratory: Denies cough, dyspnea or wheezes Cardiovascular: Denies palpitation, chest discomfort or lower extremity swelling Gastrointestinal:  Denies nausea, heartburn or change in bowel habits Skin: Denies abnormal skin rashes Lymphatics: Denies new lymphadenopathy or easy bruising Neurological:Denies numbness, tingling or new weaknesses Behavioral/Psych: Mood is stable, no new changes  All other systems were reviewed with the patient and are negative.  MEDICAL HISTORY:  Past Medical History:  Diagnosis Date  . CAD (coronary artery disease)    Cypher stent 09/2002  . Cancer [facility]) 2018   left breast, radiation therapy  . CHF (congestive heart failure) (HCC)   . Depression    . Diabetes mellitus (HCC)   . Hyperlipidemia   . Hypertension   . Myocardial infarction (HCC)    2003,went into cardiac shock  . Osteoporosis   . Seizures (HCC)    1st seizure age late 23's; most recent 07/28/17  . Sinusitis     SURGICAL HISTORY: Past Surgical History:  Procedure Laterality Date  . ABDOMINAL HYSTERECTOMY    . APPENDECTOMY    . BREAST LUMPECTOMY WITH RADIOACTIVE SEED AND SENTINEL LYMPH NODE BIOPSY Left 03/16/2017   Procedure: INJECT BLUE DYE LEFT BREAST, LEFT BREAST LUMPECTOMY WITH RADIOACTIVE SEED AND LEFT AXILLARY DEEP SENTINEL LYMPH NODE  BIOPSY ADJACENT TISSUE TRANSFER;  Surgeon: Claud Kelp, MD;  Location: [facility] OR;  Service: General;  Laterality: Left;  . CARPAL TUNNEL RELEASE Bilateral   . COLONOSCOPY WITH PROPOFOL N/A 05/30/2015   Procedure: COLONOSCOPY WITH PROPOFOL;  Surgeon: Jeani Hawking, MD;  Location: [facility] ENDOSCOPY;  Service: Endoscopy;  Laterality: N/A;  . COLONOSCOPY WITH PROPOFOL N/A 06/09/2018   Procedure: COLONOSCOPY WITH PROPOFOL;  Surgeon: Jeani Hawking, MD;  Location: [facility] ENDOSCOPY;  Service: Endoscopy;  Laterality: N/A;  . CORONARY ANGIOPLASTY WITH STENT PLACEMENT  10-02-2002  . Exploratory abdominal     Bleeding in abdomen after tubal  . POLYPECTOMY  06/09/2018   Procedure: POLYPECTOMY;  Surgeon: Jeani Hawking, MD;  Location: [facility] ENDOSCOPY;  Service: Endoscopy;;  . TUBAL LIGATION    . UMBILICAL HERNIA REPAIR      I have reviewed the social history and family history with the patient and they are unchanged from previous note.  ALLERGIES:  is allergic to ace inhibitors,  keflex [cephalexin], phenobarbital, sulfate, and altace [ramipril].  MEDICATIONS:  Current Outpatient Medications  Medication Sig Dispense Refill  . acetaminophen (TYLENOL) 500 MG tablet Take 1,000 mg by mouth every 6 (six) hours as needed for moderate pain or headache.    . albuterol (VENTOLIN HFA) 108 (90 Base) MCG/ACT inhaler Inhale 2 puffs into the lungs every 6 (six) hours  as needed for wheezing or shortness of breath. 8 g 6  . anastrozole (ARIMIDEX) 1 MG tablet Take 1 tablet by mouth once daily 90 tablet 0  . aspirin EC 81 MG tablet Take 81 mg by mouth at bedtime.    Marland Kitchen atorvastatin (LIPITOR) 40 MG tablet Take 40 mg by mouth at bedtime.    . B-D ULTRAFINE III SHORT PEN 31G X 8 MM MISC Inject into the skin as directed.    . Calcium Carbonate-Vitamin D 600-400 MG-UNIT per tablet Take 1 tablet by mouth daily.     . carvedilol (COREG) 3.125 MG tablet Take 3.125 mg by mouth 2 (two) times daily.     . clonazePAM (KLONOPIN) 1 MG tablet Take 1 mg by mouth at bedtime.     . Fluticasone-Umeclidin-Vilant (TRELEGY ELLIPTA) 100-62.5-25 MCG/INH AEPB Inhale 1 puff then rinse mouth, once daily 60 each 12  . glimepiride (AMARYL) 4 MG tablet Take 4 mg by mouth 2 (two) times daily.    . halobetasol (ULTRAVATE) 0.05 % cream Apply 1 application topically 2 (two) times daily as needed (for rash).     . Insulin Detemir (LEVEMIR) 100 UNIT/ML Pen Inject 17-25 Units into the skin See admin instructions. Inject 25 units SQ in the morning and inject 17 units SQ at night    . insulin lispro (HUMALOG) 100 UNIT/ML injection Inject 7 Units into the skin 2 (two) times daily with breakfast and lunch.     . insulin lispro (HUMALOG) 100 UNIT/ML injection Inject 9 Units into the skin at bedtime.    . levETIRAcetam (KEPPRA) 500 MG tablet Take 1 tablet (500 mg total) by mouth 2 (two) times daily. 180 tablet 3  . losartan (COZAAR) 50 MG tablet Take 50 mg by mouth at bedtime.     . Multiple Vitamin (MULTIVITAMIN WITH MINERALS) TABS tablet Take 1 tablet by mouth at bedtime.    . nitroGLYCERIN (NITROSTAT) 0.4 MG SL tablet Place 1 tablet (0.4 mg total) under the tongue every 5 (five) minutes as needed for chest pain. 25 tablet 1  . Omega-3 Fatty Acids (FISH OIL) 1200 MG CAPS Take 2,400 mg by mouth 2 (two) times daily.    Marland Kitchen omeprazole (PRILOSEC) 20 MG capsule Take 20 mg by mouth at bedtime.     Glory Rosebush  VERIO test strip USE THREE TIMES A DAY AND AS NEEDED FOR HYPOGLYCEMIA    . pioglitazone (ACTOS) 30 MG tablet Take 30 mg by mouth at bedtime.    Vladimir Faster Glycol-Propyl Glycol 0.4-0.3 % SOLN Place 1 drop into both eyes 2 (two) times daily.    . risedronate (ACTONEL) 150 MG tablet Take 150 mg by mouth every 30 (thirty) days.    Marland Kitchen sertraline (ZOLOFT) 100 MG tablet Take 100 mg by mouth at bedtime.    . TRIJARDY XR 12.5-2.02-999 MG TB24 Take 1 tablet by mouth 2 (two) times daily.     No current facility-administered medications for this visit.    PHYSICAL EXAMINATION: ECOG PERFORMANCE STATUS: 0 - Asymptomatic  There were no vitals filed for this visit. There were no vitals filed for this  visit.  Patient appears well over the phone. Speech is clear and intact. Normal mood/affect. No cough or conversational dyspnea.   LABORATORY DATA:  I have reviewed the data as listed CBC Latest Ref Rng & Units 12/02/2020 04/29/2020 02/12/2020  WBC 4.0 - 10.5 K/uL 7.9 5.8 7.1  Hemoglobin 12.0 - 15.0 g/dL 16.2(H) 16.2(H) 16.6(H)  Hematocrit 36.0 - 46.0 % 50.6(H) 49.3(H) 51.9(H)  Platelets 150 - 400 K/uL 128(L) 107(L) 126(L)     CMP Latest Ref Rng & Units 12/02/2020 02/12/2020 12/26/2019  Glucose 70 - 99 mg/dL 150(H) 177(H) 281(H)  BUN 8 - 23 mg/dL $Remove'11 14 21  'QJeuCVQ$ Creatinine 0.44 - 1.00 mg/dL 0.75 0.71 0.78  Sodium 135 - 145 mmol/L 138 141 136  Potassium 3.5 - 5.1 mmol/L 4.3 4.2 4.4  Chloride 98 - 111 mmol/L 101 106 103  CO2 22 - 32 mmol/L $RemoveB'27 23 24  'jALayYuv$ Calcium 8.9 - 10.3 mg/dL 10.2 9.0 9.5  Total Protein 6.5 - 8.1 g/dL 7.9 7.4 7.3  Total Bilirubin 0.3 - 1.2 mg/dL 0.6 0.4 0.5  Alkaline Phos 38 - 126 U/L 60 76 73  AST 15 - 41 U/L $Remo'24 20 18  'QhBkQ$ ALT 0 - 44 U/L $Remo'29 22 23      'ihtkk$ RADIOGRAPHIC STUDIES: I have personally reviewed the radiological images as listed and agreed with the findings in the report. No results found.   ASSESSMENT & PLAN: Monica [name] is a 75 y.o. female with    1. 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 Tolerating well.  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 Work up showed elevated erythropoietin which supportssecondary polycythemia.  -MPN genomic testing panel 12/2019 showed negativeJAK2, CALR, ABL1, KIT, etc, the only positive was TP53 mutation, which could be associated with myeloid neoplasm. Overall not definitve for a MPN diagnosis.  -US abdomen 04/11/20 shows mild splenomegaly, fatty liver and benign cystic liver lesions.  -Bone marrow biopsy from 04/29/20 showed no morphologic evidence of carcinoma or hematopoietic neoplasm.  -Overall work up indicates polycythemia secondary to her heavy smoking history and COPD. she has been encouraged to quit  -no phlebotomy or medical treatment indicated for secondary polycythemia -H/H stable, no signs of thrombosis. Continue smoking cessation  3. 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 Iron deficiency without anemia -Her 2021 labs showed evidence of iron deficiency, no clinical evidence of bleeding  -colonoscopy 05/2018 showed diverticulosis and had 3-4 polyps removed which can lead to bleeding.  -she was recommended to start oral iron but did not.  -12/02/20 labs show ferritin 10, TIBC 504, 14% sat which is  consistent with iron deficiency.  -I recommend to begin oral iron ferrous sulfate 1 tab BID and monitor   5. Osteopenia  -Continue calcium and vitamin D -Based on her DEXA from 06/30/17 she is osteopenic with a left femur neck T-Score of -2.4  -She has started  risedronate by her PCP, tolerating well -DEXA was not done in 2020, will repeat this Spring with mammo, order placed   6. Smoking and lung nodule, Emphysema -She has heavy smoking history since in her late 69s, has cut cigarettes to half pack a day -low-dose CT scan 02/2018 for lung cancer screening showed several small pulmonary nodules. -Screening CT Chest from 12/27/19 was negative for malignancy but does show emphysema in her lungs. 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 She also has mild thrombocytopenia.  -Sleep study 07/2020 showed sleep apnea with desat to 76%, patient reportedly started on nocturnal oxygen -f/u with Dr Annamaria Boots    7. Comorbidities: DM, Depression and mood swing -She is on Zoloft 100 mg daily,mostly stable mood. -F/up PCP  PLAN: -Labs reviewed -Continue breast cancer surveillance and AI, mammogram and DEXA 02/2021 -Continue aspirin and observation for secondary polycythemia and thrombocytopenia -begin oral iron ferrous sulfate 1 tab BID, repeat lab in 3 months  -Quit smoking -F/up PCP, pulm, and other care providers -lab and F/up in 6 months   Orders Placed This Encounter  Procedures  . DG Bone Density    Standing Status:   Future    Standing Expiration Date:   12/02/2021    Scheduling Instructions:     [facility]    Order Specific Question:   Reason for Exam (SYMPTOM  OR DIAGNOSIS REQUIRED)    Answer:   osteoporosis, on AI    Order Specific Question:   Preferred imaging location?    Answer:   External  . MM DIAG BREAST TOMO BILATERAL    Standing Status:   Future    Standing Expiration Date:   12/02/2021    Scheduling Instructions:     [facility]    Order Specific Question:    Reason for Exam (SYMPTOM  OR DIAGNOSIS REQUIRED)    Answer:   h/o left breast cancer 2018 s/p lumpectomy, RT, on AI    Order Specific Question:   Preferred imaging location?    Answer:   External    All questions were answered. The patient knows to call the clinic with any problems, questions or concerns. No barriers to learning were detected. Total non-face-to-face encounter time was 25 minutes.      Alla Feeling, NP 12/02/20

## 2020-12-02 NOTE — Telephone Encounter (Signed)
Scheduled follow-up appointments per 2/22 los. Patient is aware.

## 2020-12-02 NOTE — Telephone Encounter (Signed)
-----   Message from Alla Feeling, NP sent at 12/02/2020  3:02 PM EST ----- Please let her know iron studies came back after she and I spoke today. Iron is low, I recommend 1 tab ferrous sulfate twice daily. Watch for constipation, educate that stool may seem dark/black. Will bring her back for lab in 3 months to see if her iron level is improving (I will call with results), then lab and f/up in 6 months.   Thanks, Regan Rakers

## 2020-12-11 DIAGNOSIS — E0865 Diabetes mellitus due to underlying condition with hyperglycemia: Secondary | ICD-10-CM | POA: Diagnosis not present

## 2020-12-11 DIAGNOSIS — R7989 Other specified abnormal findings of blood chemistry: Secondary | ICD-10-CM | POA: Diagnosis not present

## 2020-12-11 DIAGNOSIS — I10 Essential (primary) hypertension: Secondary | ICD-10-CM | POA: Diagnosis not present

## 2020-12-11 DIAGNOSIS — E7800 Pure hypercholesterolemia, unspecified: Secondary | ICD-10-CM | POA: Diagnosis not present

## 2020-12-13 DIAGNOSIS — J449 Chronic obstructive pulmonary disease, unspecified: Secondary | ICD-10-CM | POA: Diagnosis not present

## 2020-12-26 DIAGNOSIS — Z720 Tobacco use: Secondary | ICD-10-CM | POA: Diagnosis not present

## 2020-12-26 DIAGNOSIS — R569 Unspecified convulsions: Secondary | ICD-10-CM | POA: Diagnosis not present

## 2020-12-26 DIAGNOSIS — E1165 Type 2 diabetes mellitus with hyperglycemia: Secondary | ICD-10-CM | POA: Diagnosis not present

## 2020-12-26 DIAGNOSIS — I10 Essential (primary) hypertension: Secondary | ICD-10-CM | POA: Diagnosis not present

## 2020-12-26 DIAGNOSIS — Z0000 Encounter for general adult medical examination without abnormal findings: Secondary | ICD-10-CM | POA: Diagnosis not present

## 2020-12-26 DIAGNOSIS — D696 Thrombocytopenia, unspecified: Secondary | ICD-10-CM | POA: Diagnosis not present

## 2020-12-26 DIAGNOSIS — Z79899 Other long term (current) drug therapy: Secondary | ICD-10-CM | POA: Diagnosis not present

## 2020-12-26 DIAGNOSIS — E7800 Pure hypercholesterolemia, unspecified: Secondary | ICD-10-CM | POA: Diagnosis not present

## 2020-12-26 DIAGNOSIS — Z853 Personal history of malignant neoplasm of breast: Secondary | ICD-10-CM | POA: Diagnosis not present

## 2021-01-13 DIAGNOSIS — J449 Chronic obstructive pulmonary disease, unspecified: Secondary | ICD-10-CM | POA: Diagnosis not present

## 2021-02-06 NOTE — Progress Notes (Signed)
HPI F Smoker( 60 pkyrs) followed for Nocturnal Hypoxemia, COPD, , complicated by Polycythemia, Aortic Atherosclerosis, CAD/MI/ stent, DM2, Seizure Disorder, Dyslipidemia, Breast Cancer L(Lumpectomy/ XRT),  Tobacco Abuse, HST 07/18/20- AHI 5.3/ hr, desaturation to 76% average 86%, body weight 118 lbs PFT 04/17/20- Minimal obstruction, no resp to BD, mild Diffusion defect  FEV1/FVC  .98 Walk Test 02/09/21- 3 Laps- lowest O2 sat 87%, improved to 94% on O2 2L POC ================================================================   07/10/20- 55 yoF Smoker followed for sleep , complicated by Polycythemia Aortic Atherosclerosis, CAD/MI/ stent, DM2, Seizure Disorder, Dyslipidemia, Breast Cancer L(Lumpectomy/ XRT), Secondary Polycythemia, Tobacco Abuse, HST was ordered- not done PFT 04/17/20- Minimal obstruction, no resp to BD, mild Diffusion defect  FEV1/FVC  .64 -----pt is here no compliants Covid vax- 2 Moderna Flu vax- done No changes. Denies breathing discomfort. Original concern was polycythemia. HST wasn't done- she agrees to reorder to check night time respiratory status.  CBC 7/20- Hgb 16.2. Bone marrow bx in July- no obvious marrow problem.   02/09/21- 40 yoF former Smoker( 60 pkyrs quit March 2022) followed for Nocturnal Hypoxemia, COPD, Minimal OSA,  complicated by Polycythemia, Aortic Atherosclerosis, CAD/MI/ stent, DM2, Seizure Disorder, Dyslipidemia, Breast Cancer L(Lumpectomy/ XRT),  Tobacco Abuse, HST 07/18/20- AHI 5.3/ hr, desaturation to 76% average 86%, body weight 118 lbs O2 2l sleep and portable/ [facility]- ordered 08/15/20 for nocturnal hypoxemia -Albuterol hfa, Trelegy 100,  Body weight today-120 lbs Covid vax-3 Phizer Flu vax- had NP visiit 10/09/20- for COPD       Continues Low dose screening CT chest -----Requesting portable oxygen. Wants POC. Reports quitting smoking mid-March, 2022.  Walk Test 02/09/21- 3 Laps- lowest O2 sat 87%, improved to 94% on O2 2L POC  ROS-see HPI   + =  positive Constitutional:    weight loss, night sweats, fevers, chills, fatigue, lassitude. HEENT:    headaches, difficulty swallowing, tooth/dental problems, sore throat,       sneezing, itching, ear ache, nasal congestion, post nasal drip, snoring CV:    chest pain, orthopnea, PND, swelling in lower extremities, anasarca,                                   dizziness, palpitations Resp:   +shortness of breath with exertion or at rest.                +productive cough,   non-productive cough, coughing up of blood.              change in color of mucus.  wheezing.   Skin:    rash or lesions. GI:  No-   heartburn, indigestion, abdominal pain, nausea, vomiting, diarrhea,                 change in bowel habits, loss of appetite GU: dysuria, change in color of urine, no urgency or frequency.   flank pain. MS:   joint pain, stiffness, decreased range of motion, back pain. Neuro-     nothing unusual Psych:  change in mood or affect.  depression or +anxiety.   memory loss.  OBJ- Physical Exam General- Alert, Oriented, Affect-appropriate, Distress- none acute, +slender Skin- rash-none, lesions- none, excoriation- none Lymphadenopathy- none Head- atraumatic            Eyes- Gross vision intact, PERRLA, conjunctivae and secretions clear            Ears- Hearing, canals-normal  Nose- Clear, no-Septal dev, mucus, polyps, erosion, perforation             Throat- Mallampati II-III , mucosa clear , drainage- none, tonsils- atrophic, +edentulous/ dentures Neck- flexible , trachea midline, no stridor , thyroid nl, carotid no bruit Chest - symmetrical excursion , unlabored           Heart/CV- RRR , no murmur , no gallop  , no rub, nl s1 s2                           - JVD- none , edema- none, stasis changes- none, varices- none           Lung- +few basilar crackles, wheeze- none, cough+congested, dullness-none, rub- none           Chest wall-  Abd-  Br/ Gen/ Rectal- Not done, not  indicated Extrem- cyanosis- none, clubbing, none, atrophy- none, strength- nl, no clubbing Neuro- grossly intact to observation

## 2021-02-09 ENCOUNTER — Other Ambulatory Visit: Payer: Self-pay

## 2021-02-09 ENCOUNTER — Ambulatory Visit: Payer: Medicare Other | Admitting: Internal Medicine

## 2021-02-09 ENCOUNTER — Other Ambulatory Visit: Payer: Self-pay | Admitting: Hematology

## 2021-02-09 ENCOUNTER — Encounter: Payer: Self-pay | Admitting: Internal Medicine

## 2021-02-09 ENCOUNTER — Ambulatory Visit (INDEPENDENT_AMBULATORY_CARE_PROVIDER_SITE_OTHER): Payer: Medicare Other

## 2021-02-09 VITALS — BP 110/64 | HR 62 | Temp 97.3°F | Ht 65.0 in | Wt 120.2 lb

## 2021-02-09 DIAGNOSIS — J9611 Chronic respiratory failure with hypoxia: Secondary | ICD-10-CM | POA: Diagnosis not present

## 2021-02-09 DIAGNOSIS — J449 Chronic obstructive pulmonary disease, unspecified: Secondary | ICD-10-CM | POA: Diagnosis not present

## 2021-02-09 DIAGNOSIS — E2839 Other primary ovarian failure: Secondary | ICD-10-CM

## 2021-02-09 DIAGNOSIS — J439 Emphysema, unspecified: Secondary | ICD-10-CM | POA: Diagnosis not present

## 2021-02-09 NOTE — Patient Instructions (Signed)
Order- CXR    Dx COPD mixed type  Order- Referral to S. Groce, NP to re-enroll in CT chest low dose screening program  Order- DME [facility] Please add POC 2L pulsed portable O2 for Dx Chronic Respiratory Failure with Hypoxia  I suggest Mucinex otc to help loosen mucus in your chest  Please call if we can help

## 2021-02-10 DIAGNOSIS — J449 Chronic obstructive pulmonary disease, unspecified: Secondary | ICD-10-CM | POA: Diagnosis not present

## 2021-02-10 NOTE — Progress Notes (Signed)
PATIENT: Monica [name] DOB: 1946-04-29  REASON FOR VISIT: follow up HISTORY FROM: patient  Chief Complaint  Patient presents with  . Follow-up    Rm 2 alone Pt is well, no seizures      HISTORY OF PRESENT ILLNESS: 02/11/21 ALL:  She returns for follow up for seizures. She continues levetiracetam 500mg  BID. She continues to tolerate medications with no adverse effects. No seizure activity. She is followed by PCP, oncology and pulmonology regularly. Labs have been stable. She is trying to quit smoking.    10/29/2019 ALL:  Monica [name] is a 75 y.o. female here today for follow up for seizure. She continues levetiracetam 500mg  twice daily. Last seizure 07/2017. She denies missed doses of medication or obvious adverse effects.  She is able to perform ADLs independently.  She lives at home with her husband.  She reports that she is eating and sleeping well.  She does drive but only during the daytime and to local places.  She is doing well today and without concerns.  HISTORY: (copied from [place] [facility] note on 10/26/2018)  UPDATE 1/16/2020CM Monica [name], 75 year old female returns for follow-up with seizure disorder with last seizure occurring in October 2018.  She is currently on Keppra 500 mg twice daily.  She denies any side effects to the medication.  She denies any interval medical issues.  She returns for reevaluation and refills  UPDATE 7/2/2019CM Monica [name], 75 year old female returns for follow-up for seizure disorder.  Last seizure occurred 07/28/2017.  She is currently taking levetiracetam 500 mg twice daily without side effects.  She returns for reevaluation  UPDATE (10/05/17, VRP): Since last visit, doingwell. ToleratingLEV 500mg twice a day.No alleviating or aggravating factors.  PRIOR HPI (08/02/17):75 year old female here for evaluation of seizures. History of diabetes, heart disease, breast cancer. At age late 75 years old patient had a seizure, lips turn  blue, no tongue biting or incontinence. She was apparently treated with antiseizure medication for several years and then discontinued. May 24, 2017 patient was at [facility], when all of a sudden she collapsed and had a seizure. Blood glucose was checked within 15 minutes and was 52. No warning symptoms. She ate breakfast that morning but no lunch. She was taken to the hospital and evaluated. July 28, 2017 patient was at home, when all of a sudden she collapsed. She was found stiff all over, with some convulsions. Family gave her some juice to drink and blood sugar after testing was 120. She was confused afterwards. No tongue biting or incontinence. Patient has diagnosis of breast cancer. Her first radiation treatment was May 11, 2017.   REVIEW OF SYSTEMS: Out of a complete 14 system review of symptoms, the patient complains only of the following symptoms, none and all other reviewed systems are negative.  ALLERGIES: Allergies  Allergen Reactions  . Ace Inhibitors Dermatitis and Cough  . Keflex [Cephalexin] Itching  . Phenobarbital Other (See Comments)    Black spots on hands and feet.  . Sulfate Nausea And Vomiting and Other (See Comments)    Dizziness   . Altace [Ramipril] Other (See Comments)    UNSPECIFIED REACTION      HOME MEDICATIONS: Outpatient Medications Prior to Visit  Medication Sig Dispense Refill  . acetaminophen (TYLENOL) 500 MG tablet Take 1,000 mg by mouth every 6 (six) hours as needed for moderate pain or headache.    . albuterol (VENTOLIN HFA) 108 (90 Base) MCG/ACT inhaler Inhale 2 puffs into the lungs  every 6 (six) hours as needed for wheezing or shortness of breath. 8 g 6  . anastrozole (ARIMIDEX) 1 MG tablet Take 1 tablet by mouth once daily 90 tablet 0  . aspirin EC 81 MG tablet Take 81 mg by mouth at bedtime.    Marland Kitchen atorvastatin (LIPITOR) 40 MG tablet Take 40 mg by mouth at bedtime.    . B-D ULTRAFINE III SHORT PEN 31G X 8 MM MISC Inject into  the skin as directed.    . Calcium Carbonate-Vitamin D 600-400 MG-UNIT per tablet Take 1 tablet by mouth daily.     . carvedilol (COREG) 3.125 MG tablet Take 3.125 mg by mouth 2 (two) times daily.     . clonazePAM (KLONOPIN) 1 MG tablet Take 1 mg by mouth at bedtime.     . Fluticasone-Umeclidin-Vilant (TRELEGY ELLIPTA) 100-62.5-25 MCG/INH AEPB Inhale 1 puff then rinse mouth, once daily 60 each 12  . glimepiride (AMARYL) 4 MG tablet Take 4 mg by mouth 2 (two) times daily.    . halobetasol (ULTRAVATE) 0.05 % cream Apply 1 application topically 2 (two) times daily as needed (for rash).     . Insulin Detemir (LEVEMIR) 100 UNIT/ML Pen Inject 17-25 Units into the skin See admin instructions. Inject 25 units SQ in the morning and inject 17 units SQ at night    . insulin lispro (HUMALOG) 100 UNIT/ML injection Inject 7 Units into the skin 2 (two) times daily with breakfast and lunch.     . insulin lispro (HUMALOG) 100 UNIT/ML injection Inject 9 Units into the skin at bedtime.    Marland Kitchen losartan (COZAAR) 50 MG tablet Take 50 mg by mouth at bedtime.     . Multiple Vitamin (MULTIVITAMIN WITH MINERALS) TABS tablet Take 1 tablet by mouth at bedtime.    . Omega-3 Fatty Acids (FISH OIL) 1200 MG CAPS Take 2,400 mg by mouth 2 (two) times daily.    Marland Kitchen omeprazole (PRILOSEC) 20 MG capsule Take 20 mg by mouth at bedtime.     Glory Rosebush VERIO test strip USE THREE TIMES A DAY AND AS NEEDED FOR HYPOGLYCEMIA    . pioglitazone (ACTOS) 30 MG tablet Take 30 mg by mouth at bedtime.    Vladimir Faster Glycol-Propyl Glycol 0.4-0.3 % SOLN Place 1 drop into both eyes 2 (two) times daily.    . risedronate (ACTONEL) 150 MG tablet Take 150 mg by mouth every 30 (thirty) days.    Marland Kitchen sertraline (ZOLOFT) 100 MG tablet Take 100 mg by mouth at bedtime.    . TRIJARDY XR 12.5-2.02-999 MG TB24 Take 1 tablet by mouth 2 (two) times daily.    . nitroGLYCERIN (NITROSTAT) 0.4 MG SL tablet Place 1 tablet (0.4 mg total) under the tongue every 5 (five) minutes  as needed for chest pain. 25 tablet 1  . levETIRAcetam (KEPPRA) 500 MG tablet Take 1 tablet (500 mg total) by mouth 2 (two) times daily. 180 tablet 3   No facility-administered medications prior to visit.    PAST MEDICAL HISTORY: Past Medical History:  Diagnosis Date  . CAD (coronary artery disease)    Cypher stent 09/2002  . Cancer [facility]) 2018   left breast, radiation therapy  . CHF (congestive heart failure) ([place])   . Depression   . Diabetes mellitus ([place])   . Hyperlipidemia   . Hypertension   . Myocardial infarction ([place])    2003,went into cardiac shock  . Osteoporosis   . Seizures ([place])    1st seizure age  late 4's; most recent 07/28/17  . Sinusitis     PAST SURGICAL HISTORY: Past Surgical History:  Procedure Laterality Date  . ABDOMINAL HYSTERECTOMY    . APPENDECTOMY    . BREAST LUMPECTOMY WITH RADIOACTIVE SEED AND SENTINEL LYMPH NODE BIOPSY Left 03/16/2017   Procedure: INJECT BLUE DYE LEFT BREAST, LEFT BREAST LUMPECTOMY WITH RADIOACTIVE SEED AND LEFT AXILLARY DEEP SENTINEL LYMPH NODE  BIOPSY ADJACENT TISSUE TRANSFER;  Surgeon: [name], MD;  Location: [facility];  Service: General;  Laterality: Left;  . CARPAL TUNNEL RELEASE Bilateral   . COLONOSCOPY WITH PROPOFOL N/A 05/30/2015   Procedure: COLONOSCOPY WITH PROPOFOL;  Surgeon: Carol Ada, MD;  Location: [facility];  Service: Endoscopy;  Laterality: N/A;  . COLONOSCOPY WITH PROPOFOL N/A 06/09/2018   Procedure: COLONOSCOPY WITH PROPOFOL;  Surgeon: Carol Ada, MD;  Location: [facility];  Service: Endoscopy;  Laterality: N/A;  . CORONARY ANGIOPLASTY WITH STENT PLACEMENT  10-02-2002  . Exploratory abdominal     Bleeding in abdomen after tubal  . POLYPECTOMY  06/09/2018   Procedure: POLYPECTOMY;  Surgeon: Carol Ada, MD;  Location: [facility];  Service: Endoscopy;;  . TUBAL LIGATION    . UMBILICAL HERNIA REPAIR      FAMILY HISTORY: Family History  Problem Relation Age of Onset  . Dementia Mother   .  Kidney disease Mother   . Heart attack Father 62       Died age 67  . Heart attack Brother 8  . Heart attack Brother 25    SOCIAL HISTORY: Social History   Socioeconomic History  . Marital status: Married    Spouse name: Sonia Side  . Number of children: 3  . Years of education: 75  . Highest education level: Not on file  Occupational History  . Not on file  Tobacco Use  . Smoking status: Former Smoker    Packs/day: 2.00    Years: 30.00    Pack years: 60.00    Types: Cigarettes    Quit date: 12/28/2020    Years since quitting: 0.1  . Smokeless tobacco: Never Used  . Tobacco comment: quit mid march 2022  Substance and Sexual Activity  . Alcohol use: No    Alcohol/week: 0.0 standard drinks  . Drug use: No  . Sexual activity: Not on file  Other Topics Concern  . Not on file  Social History Narrative   Lives with husband.    Caffeine-    Social Determinants of Health   Financial Resource Strain: Not on file  Food Insecurity: Not on file  Transportation Needs: Not on file  Physical Activity: Not on file  Stress: Not on file  Social Connections: Not on file  Intimate Partner Violence: Not on file      PHYSICAL EXAM  Vitals:   02/11/21 1122  BP: 105/62  Pulse: 63  Weight: 123 lb (55.8 kg)  Height: 5\' 5"  (1.651 m)   Body mass index is 20.47 kg/m.  Generalized: Well developed, in no acute distress  Cardiology: normal rate and rhythm, no murmur noted Respiratory: Clear to auscultation bilaterally  Neurological examination  Mentation: Alert oriented to time, place, history taking. Follows all commands speech and language fluent Cranial nerve II-XII: Pupils were equal round reactive to light. Extraocular movements were full, visual field were full on confrontational test. Facial sensation and strength were normal. Head turning and shoulder shrug  were normal and symmetric. Motor: The motor testing reveals 4 over 5 strength of all 4 extremities. Good symmetric  motor tone is noted throughout.  Sensory: Sensory testing is intact to soft touch on all 4 extremities. No evidence of extinction is noted.  Coordination: Cerebellar testing reveals good finger-nose-finger and heel-to-shin bilaterally.  Gait and station: Gait is normal.   DIAGNOSTIC DATA (LABS, IMAGING, TESTING) - I reviewed patient records, labs, notes, testing and imaging myself where available.  No flowsheet data found.   Lab Results  Component Value Date   WBC 7.9 12/02/2020   HGB 16.2 (H) 12/02/2020   HCT 50.6 (H) 12/02/2020   MCV 77.1 (L) 12/02/2020   PLT 128 (L) 12/02/2020      Component Value Date/Time   NA 138 12/02/2020 1125   NA 139 08/25/2017 1224   K 4.3 12/02/2020 1125   K 4.3 08/25/2017 1224   CL 101 12/02/2020 1125   CO2 27 12/02/2020 1125   CO2 26 08/25/2017 1224   GLUCOSE 150 (H) 12/02/2020 1125   GLUCOSE 149 (H) 08/25/2017 1224   BUN 11 12/02/2020 1125   BUN 10.1 08/25/2017 1224   CREATININE 0.75 12/02/2020 1125   CREATININE 0.7 08/25/2017 1224   CALCIUM 10.2 12/02/2020 1125   CALCIUM 9.4 08/25/2017 1224   PROT 7.9 12/02/2020 1125   PROT 7.2 08/25/2017 1224   ALBUMIN 4.6 12/02/2020 1125   ALBUMIN 4.0 08/25/2017 1224   AST 24 12/02/2020 1125   AST 24 08/25/2017 1224   ALT 29 12/02/2020 1125   ALT 33 08/25/2017 1224   ALKPHOS 60 12/02/2020 1125   ALKPHOS 72 08/25/2017 1224   BILITOT 0.6 12/02/2020 1125   BILITOT 0.49 08/25/2017 1224   GFRNONAA >60 12/02/2020 1125   GFRAA >60 02/12/2020 1118   No results found for: CHOL, HDL, LDLCALC, LDLDIRECT, TRIG, CHOLHDL Lab Results  Component Value Date   HGBA1C 8.5 (H) 03/09/2017   Lab Results  Component Value Date   VITAMINB12 540 02/12/2020   No results found for: TSH     ASSESSMENT AND PLAN 75 y.o. year old female  has a past medical history of CAD (coronary artery disease), Cancer ([place]) (2018), CHF (congestive heart failure) ([place]), Depression, Diabetes mellitus ([place]), Hyperlipidemia,  Hypertension, Myocardial infarction ([place]), Osteoporosis, Seizures ([place]), and Sinusitis. here with     ICD-10-CM   1. Seizure disorder ([place])  G40.909      Reegan is doing well on levetiracetam 500mg  twice daily.  No recent seizure activity.  She is tolerating medication well.  Primary care follows regularly and updates labs biannually.  Labs from February 2022 reviewed for today's visit.  We will continue current treatment plan.  She was advised to stay well-hydrated and work on a healthy well-balanced diet and regular exercise. Smoking cessation encouraged. Seizure precautions reviewed.  She will follow-up with me in 1 year, sooner if needed.  She verbalizes understanding and agreement with this plan.   No orders of the defined types were placed in this encounter.    Meds ordered this encounter  Medications  . levETIRAcetam (KEPPRA) 500 MG tablet    Sig: Take 1 tablet (500 mg total) by mouth 2 (two) times daily.    Dispense:  180 tablet    Refill:  3    Order Specific Question:   Supervising Provider    Answer:   Bess Harvest, FNP-C 02/11/2021, 12:01 PM [facility] [address] [place],  [address] [phone]

## 2021-02-10 NOTE — Patient Instructions (Signed)
Below is our plan:  We will continue levetiracetam 500mg  twice daily   Please make sure you are staying well hydrated. I recommend 50-60 ounces daily. Well balanced diet and regular exercise encouraged. Consistent sleep schedule with 6-8 hours recommended.   Please continue follow up with care team as directed.   Follow up with me in 1 year   You may receive a survey regarding today's visit. I encourage you to leave honest feed back as I do use this information to improve patient care. Thank you for seeing me today!      Seizure, Adult A seizure is a sudden burst of abnormal electrical and chemical activity in the brain. Seizures usually last from 30 seconds to 2 minutes.  What are the causes? Common causes of this condition include:  Fever or infection.  Problems that affect the brain. These may include: ? A brain or head injury. ? Bleeding in the brain. ? A brain tumor.  Low levels of blood sugar or salt.  Kidney problems or liver problems.  Conditions that are passed from parent to child (are inherited).  Problems with a substance, such as: ? Having a reaction to a drug or a medicine. ? Stopping the use of a substance all of a sudden (withdrawal).  A stroke.  Disorders that affect how you develop. Sometimes, the cause may not be known.  What increases the risk?  Having someone in your family who has epilepsy. In this condition, seizures happen again and again over time. They have no clear cause.  Having had a tonic-clonic seizure before. This type of seizure causes you to: ? Tighten the muscles of the whole body. ? Lose consciousness.  Having had a head injury or strokes before.  Having had a lack of oxygen at birth. What are the signs or symptoms? There are many types of seizures. The symptoms vary depending on the type of seizure you have. Symptoms during a seizure  Shaking that you cannot control (convulsions) with fast, jerky movements of  muscles.  Stiffness of the body.  Breathing problems.  Feeling mixed up (confused).  Staring or not responding to sound or touch.  Head nodding.  Eyes that blink, flutter, or move fast.  Drooling, grunting, or making clicking sounds with your mouth  Losing control of when you pee or poop. Symptoms before a seizure  Feeling afraid, nervous, or worried.  Feeling like you may vomit.  Feeling like: ? You are moving when you are not. ? Things around you are moving when they are not.  Feeling like you saw or heard something before (dj vu).  Odd tastes or smells.  Changes in how you see. You may see flashing lights or spots. Symptoms after a seizure  Feeling confused.  Feeling sleepy.  Headache.  Sore muscles. How is this treated? If your seizure stops on its own, you will not need treatment. If your seizure lasts longer than 5 minutes, you will normally need treatment. Treatment may include:  Medicines given through an IV tube.  Avoiding things, such as medicines, that are known to cause your seizures.  Medicines to prevent seizures.  A device to prevent or control seizures.  Surgery.  A diet low in carbohydrates and high in fat (ketogenic diet). Follow these instructions at home: Medicines  Take over-the-counter and prescription medicines only as told by your doctor.  Avoid foods or drinks that may keep your medicine from working, such as alcohol. Activity  Follow instructions about driving,  swimming, or doing things that would be dangerous if you had another seizure. Wait until your doctor says it is safe for you to do these things.  If you live in the [place] ask your local department of motor vehicles when you can drive.  Get a lot of rest. Teaching others  Teach friends and family what to do when you have a seizure. They should: ? Help you get down to the ground. ? Protect your head and body. ? Loosen any clothing around your neck. ? Turn you  on your side. ? Know whether or not you need emergency care. ? Stay with you until you are better.  Also, tell them what not to do if you have a seizure. Tell them: ? They should not hold you down. ? They should not put anything in your mouth.   General instructions  Avoid anything that gives you seizures.  Keep a seizure diary. Write down: ? What you remember about each seizure. ? What you think caused each seizure.  Keep all follow-up visits. Contact a doctor if:  You have another seizure or seizures. Call the doctor each time you have a seizure.  The pattern of your seizures changes.  You keep having seizures with treatment.  You have symptoms of being sick or having an infection.  You are not able to take your medicine. Get help right away if:  You have any of these problems: ? A seizure that lasts longer than 5 minutes. ? Many seizures in a row and you do not feel better between seizures. ? A seizure that makes it harder to breathe. ? A seizure and you can no longer speak or use part of your body.  You do not wake up right after a seizure.  You get hurt during a seizure.  You feel confused or have pain right after a seizure. These symptoms may be an emergency. Get help right away. Call your local emergency services (911 in the [place]  Do not wait to see if the symptoms will go away.  Do not drive yourself to the hospital. Summary  A seizure is a sudden burst of abnormal electrical and chemical activity in the brain. Seizures normally last from 30 seconds to 2 minutes.  Causes of seizures include illness, injury to the head, low levels of blood sugar or salt, and certain conditions.  Most seizures will stop on their own in less than 5 minutes. Seizures that last longer than 5 minutes are a medical emergency and need treatment right away.  Many medicines are used to treat seizures. Take over-the-counter and prescription medicines only as told by your  doctor. This information is not intended to replace advice given to you by your health care provider. Make sure you discuss any questions you have with your health care provider. Document Revised: 04/04/2020 Document Reviewed: 04/04/2020 Elsevier Patient Education  [place].

## 2021-02-11 ENCOUNTER — Ambulatory Visit: Payer: Medicare Other | Admitting: Family Medicine

## 2021-02-11 ENCOUNTER — Encounter: Payer: Self-pay | Admitting: Family Medicine

## 2021-02-11 ENCOUNTER — Other Ambulatory Visit: Payer: Self-pay

## 2021-02-11 VITALS — BP 105/62 | HR 63 | Ht 65.0 in | Wt 123.0 lb

## 2021-02-11 DIAGNOSIS — G40909 Epilepsy, unspecified, not intractable, without status epilepticus: Secondary | ICD-10-CM

## 2021-02-11 MED ORDER — LEVETIRACETAM 500 MG PO TABS: 500.0000 mg | ORAL_TABLET | Freq: Two times a day (BID) | ORAL | 3 refills | Status: DC

## 2021-02-12 ENCOUNTER — Other Ambulatory Visit: Payer: Self-pay | Admitting: *Deleted

## 2021-02-12 DIAGNOSIS — F17210 Nicotine dependence, cigarettes, uncomplicated: Secondary | ICD-10-CM

## 2021-02-12 DIAGNOSIS — Z87891 Personal history of nicotine dependence: Secondary | ICD-10-CM

## 2021-02-12 DIAGNOSIS — J449 Chronic obstructive pulmonary disease, unspecified: Secondary | ICD-10-CM | POA: Diagnosis not present

## 2021-03-02 ENCOUNTER — Other Ambulatory Visit: Payer: Self-pay

## 2021-03-02 ENCOUNTER — Inpatient Hospital Stay: Payer: Medicare Other | Attending: Nurse Practitioner

## 2021-03-02 DIAGNOSIS — E611 Iron deficiency: Secondary | ICD-10-CM | POA: Insufficient documentation

## 2021-03-02 DIAGNOSIS — C50212 Malignant neoplasm of upper-inner quadrant of left female breast: Secondary | ICD-10-CM | POA: Diagnosis not present

## 2021-03-02 DIAGNOSIS — Z170 Estrogen receptor positive status [ER+]: Secondary | ICD-10-CM

## 2021-03-02 LAB — COMPREHENSIVE METABOLIC PANEL
ALT: 20 U/L (ref 0–44)
AST: 17 U/L (ref 15–41)
Albumin: 4.2 g/dL (ref 3.5–5.0)
Alkaline Phosphatase: 79 U/L (ref 38–126)
Anion gap: 9 (ref 5–15)
BUN: 13 mg/dL (ref 8–23)
CO2: 27 mmol/L (ref 22–32)
Calcium: 10.4 mg/dL — ABNORMAL HIGH (ref 8.9–10.3)
Chloride: 101 mmol/L (ref 98–111)
Creatinine, Ser: 0.73 mg/dL (ref 0.44–1.00)
GFR, Estimated: >60 mL/min (ref >60)
Glucose, Bld: 170 mg/dL — ABNORMAL HIGH (ref 70–99)
Potassium: 4.3 mmol/L (ref 3.5–5.1)
Sodium: 137 mmol/L (ref 135–145)
Total Bilirubin: 0.5 mg/dL (ref 0.3–1.2)
Total Protein: 7.5 g/dL (ref 6.5–8.1)

## 2021-03-02 LAB — RETICULOCYTES
Immature Retic Fract: 21.7 % — ABNORMAL HIGH (ref 2.3–15.9)
RBC.: 6.47 MIL/uL — ABNORMAL HIGH (ref 3.87–5.11)
Retic Count, Absolute: 114.5 10*3/uL (ref 19.0–186.0)
Retic Ct Pct: 1.8 % (ref 0.4–3.1)

## 2021-03-02 LAB — CBC WITH DIFFERENTIAL (CANCER CENTER ONLY)
Abs Immature Granulocytes: 0.02 10*3/uL (ref 0.00–0.07)
Basophils Absolute: 0 10*3/uL (ref 0.0–0.1)
Basophils Relative: 0 %
Eosinophils Absolute: 0.2 10*3/uL (ref 0.0–0.5)
Eosinophils Relative: 3 %
HCT: 51.1 % — ABNORMAL HIGH (ref 36.0–46.0)
Hemoglobin: 16 g/dL — ABNORMAL HIGH (ref 12.0–15.0)
Immature Granulocytes: 0 %
Lymphocytes Relative: 24 %
Lymphs Abs: 1.6 10*3/uL (ref 0.7–4.0)
MCH: 24.4 pg — ABNORMAL LOW (ref 26.0–34.0)
MCHC: 31.3 g/dL (ref 30.0–36.0)
MCV: 78 fL — ABNORMAL LOW (ref 80.0–100.0)
Monocytes Absolute: 0.4 10*3/uL (ref 0.1–1.0)
Monocytes Relative: 6 %
Neutro Abs: 4.5 10*3/uL (ref 1.7–7.7)
Neutrophils Relative %: 67 %
Platelet Count: 130 10*3/uL — ABNORMAL LOW (ref 150–400)
RBC: 6.55 MIL/uL — ABNORMAL HIGH (ref 3.87–5.11)
RDW: 17.2 % — ABNORMAL HIGH (ref 11.5–15.5)
WBC Count: 6.7 10*3/uL (ref 4.0–10.5)
nRBC: 0 % (ref 0.0–0.2)

## 2021-03-02 LAB — IRON AND TIBC
Iron: 49 ug/dL (ref 41–142)
Saturation Ratios: 11 % — ABNORMAL LOW (ref 21–57)
TIBC: 448 ug/dL — ABNORMAL HIGH (ref 236–444)
UIBC: 399 ug/dL — ABNORMAL HIGH (ref 120–384)

## 2021-03-03 NOTE — Progress Notes (Signed)
I reviewed note and agree with plan.   Lyndy Russman R. Dayona Shaheen, MD 03/03/2021, 9:30 PM Certified in Neurology, Neurophysiology and Neuroimaging  [facility] [address], [address] [place], [place] [address] [phone]  

## 2021-03-06 DIAGNOSIS — Z1231 Encounter for screening mammogram for malignant neoplasm of breast: Secondary | ICD-10-CM | POA: Diagnosis not present

## 2021-03-10 DIAGNOSIS — E119 Type 2 diabetes mellitus without complications: Secondary | ICD-10-CM | POA: Diagnosis not present

## 2021-03-13 ENCOUNTER — Telehealth: Payer: Self-pay

## 2021-03-13 DIAGNOSIS — J449 Chronic obstructive pulmonary disease, unspecified: Secondary | ICD-10-CM | POA: Diagnosis not present

## 2021-03-13 NOTE — Telephone Encounter (Signed)
I spoke with Ms Liggett.  She does not tolerate the oral iron, it causes loose incontinent stools.

## 2021-03-13 NOTE — Telephone Encounter (Signed)
-----   Message from Alla Feeling, NP sent at 03/12/2021  2:49 PM EDT ----- Please let her know labs. She has mild polycythemia and iron deficiency. Has she been taking oral iron? If not, please start once daily. Encourage her to quit smoking if she has not yet. Next labs in august.   Thanks, Regan Rakers, NP

## 2021-03-15 DIAGNOSIS — J449 Chronic obstructive pulmonary disease, unspecified: Secondary | ICD-10-CM | POA: Diagnosis not present

## 2021-03-18 ENCOUNTER — Other Ambulatory Visit: Payer: Self-pay | Admitting: Hematology

## 2021-03-18 ENCOUNTER — Encounter (HOSPITAL_BASED_OUTPATIENT_CLINIC_OR_DEPARTMENT_OTHER): Payer: Self-pay

## 2021-03-18 ENCOUNTER — Ambulatory Visit (HOSPITAL_BASED_OUTPATIENT_CLINIC_OR_DEPARTMENT_OTHER)
Admission: RE | Admit: 2021-03-18 | Discharge: 2021-03-18 | Disposition: A | Discharge status: Home / Self Care | Payer: Medicare Other | Source: Ambulatory Visit | Attending: Internal Medicine | Admitting: Internal Medicine

## 2021-03-18 ENCOUNTER — Other Ambulatory Visit (HOSPITAL_BASED_OUTPATIENT_CLINIC_OR_DEPARTMENT_OTHER): Payer: Self-pay | Admitting: Internal Medicine

## 2021-03-18 ENCOUNTER — Encounter: Payer: Self-pay | Admitting: Acute Care

## 2021-03-18 ENCOUNTER — Ambulatory Visit (INDEPENDENT_AMBULATORY_CARE_PROVIDER_SITE_OTHER): Payer: Medicare Other | Admitting: Acute Care

## 2021-03-18 ENCOUNTER — Ambulatory Visit
Admission: RE | Admit: 2021-03-18 | Discharge: 2021-03-18 | Disposition: A | Discharge status: Home / Self Care | Payer: Medicare Other | Source: Ambulatory Visit | Attending: Acute Care | Admitting: Acute Care

## 2021-03-18 ENCOUNTER — Other Ambulatory Visit: Payer: Self-pay

## 2021-03-18 VITALS — BP 110/60 | HR 70 | Temp 97.4°F | Ht 65.0 in | Wt 118.0 lb

## 2021-03-18 DIAGNOSIS — R1030 Lower abdominal pain, unspecified: Secondary | ICD-10-CM | POA: Diagnosis not present

## 2021-03-18 DIAGNOSIS — Z87891 Personal history of nicotine dependence: Secondary | ICD-10-CM

## 2021-03-18 DIAGNOSIS — F17210 Nicotine dependence, cigarettes, uncomplicated: Secondary | ICD-10-CM

## 2021-03-18 DIAGNOSIS — E2839 Other primary ovarian failure: Secondary | ICD-10-CM

## 2021-03-18 NOTE — Patient Instructions (Signed)
Thank you for participating in the [facility]. It was our pleasure to meet you today. We will call you with the results of your scan within the next few days. Your scan will be assigned a Lung RADS category score by the physicians reading the scans.  This Lung RADS score determines follow up scanning.  See below for description of categories, and follow up screening recommendations. We will be in touch to schedule your follow up screening annually or based on recommendations of our providers. We will fax a copy of your scan results to your Primary Care Physician, or the physician who referred you to the program, to ensure they have the results. Please call the office if you have any questions or concerns regarding your scanning experience or results.  Our office number is [phone]. Please speak with Denise Phelps, RN. She is our Lung Cancer Screening RN. If she is unavailable when you call, please have the office staff send her a message. She will return your call at her earliest convenience. Remember, if your scan is normal, we will scan you annually as long as you continue to meet the criteria for the program. (Age 55-77, Current smoker or smoker who has quit within the last 15 years). If you are a smoker, remember, quitting is the single most powerful action that you can take to decrease your risk of lung cancer and other pulmonary, breathing related problems. We know quitting is hard, and we are here to help.  Please let us know if there is anything we can do to help you meet your goal of quitting. If you are a former smoker, congratulations. We are proud of you! Remain smoke free! Remember you can refer friends or family members through the number above.  We will screen them to make sure they meet criteria for the program. Thank you for helping us take better care of you by participating in Lung Screening.  Lung RADS Categories:  Lung RADS 1: no nodules  or definitely non-concerning nodules.  Recommendation is for a repeat annual scan in 12 months.  Lung RADS 2:  nodules that are non-concerning in appearance and behavior with a very low likelihood of becoming an active cancer. Recommendation is for a repeat annual scan in 12 months.  Lung RADS 3: nodules that are probably non-concerning , includes nodules with a low likelihood of becoming an active cancer.  Recommendation is for a 6-month repeat screening scan. Often noted after an upper respiratory illness. We will be in touch to make sure you have no questions, and to schedule your 6-month scan.  Lung RADS 4 A: nodules with concerning findings, recommendation is most often for a follow up scan in 3 months or additional testing based on our provider's assessment of the scan. We will be in touch to make sure you have no questions and to schedule the recommended 3 month follow up scan.  Lung RADS 4 B:  indicates findings that are concerning. We will be in touch with you to schedule additional diagnostic testing based on our provider's  assessment of the scan.   

## 2021-03-18 NOTE — Progress Notes (Signed)
Shared Decision Making Visit Lung Cancer Screening Program [id number])   Eligibility:  Age 75 y.o.  Pack Years Smoking History Calculation 52 pack year smoking history (# packs/per year x # years smoked)  Recent History of coughing up blood  no  Unexplained weight loss? no ( >Than 15 pounds within the last 6 months )  Prior History Lung / other cancer no (Diagnosis within the last 5 years already requiring surveillance chest CT Scans).  Smoking Status Current Smoker  Former Smokers: Years since quit: NA  Quit Date: NA  Visit Components:  Discussion included one or more decision making aids. yes  Discussion included risk/benefits of screening. yes  Discussion included potential follow up diagnostic testing for abnormal scans. yes  Discussion included meaning and risk of over diagnosis. yes  Discussion included meaning and risk of False Positives. yes  Discussion included meaning of total radiation exposure. yes  Counseling Included:  Importance of adherence to annual lung cancer LDCT screening. yes  Impact of comorbidities on ability to participate in the program. yes  Ability and willingness to under diagnostic treatment. yes  Smoking Cessation Counseling:  Current Smokers:   Discussed importance of smoking cessation. yes  Information about tobacco cessation classes and interventions provided to patient. yes  Patient provided with "ticket" for LDCT Scan. yes  Symptomatic Patient. no  Counseling NA  Diagnosis Code: Tobacco Use Z72.0  Asymptomatic Patient yes  Counseling (Intermediate counseling: > three minutes counseling) Y6063  Former Smokers:   Discussed the importance of maintaining cigarette abstinence. yes  Diagnosis Code: Personal History of Nicotine Dependence. K16.010  Information about tobacco cessation classes and interventions provided to patient. Yes  Patient provided with "ticket" for LDCT Scan. yes  Written Order for Lung Cancer  Screening with LDCT placed in Epic. Yes (CT Chest Lung Cancer Screening Low Dose W/O CM) [id number] Z12.2-Screening of respiratory organs Z87.891-Personal history of nicotine dependence  I have spent 25 minutes of face to face time with Monica [name] discussing the risks and benefits of lung cancer screening. We viewed a power point together that explained in detail the above noted topics. We paused at intervals to allow for questions to be asked and answered to ensure understanding.We discussed that the single most powerful action that she can take to decrease her risk of developing lung cancer is to quit smoking. We discussed whether or not she is ready to commit to setting a quit date. We discussed options for tools to aid in quitting smoking including nicotine replacement therapy, non-nicotine medications, support groups, Quit Smart classes, and behavior modification. We discussed that often times setting smaller, more achievable goals, such as eliminating 1 cigarette a day for a week and then 2 cigarettes a day for a week can be helpful in slowly decreasing the number of cigarettes smoked. This allows for a sense of accomplishment as well as providing a clinical benefit. I gave her the " Be Stronger Than Your Excuses" card with contact information for community resources, classes, free nicotine replacement therapy, and access to mobile apps, text messaging, and on-line smoking cessation help. I have also given her my card and contact information in the event she needs to contact me. We discussed the time and location of the scan, and that either Doroteo Glassman RN or I will call with the results within 24-48 hours of receiving them. I have offered her  a copy of the power point we viewed  as a resource in the event they need reinforcement  of the concepts we discussed today in the office. The patient verbalized understanding of all of  the above and had no further questions upon leaving the office. They have my  contact information in the event they have any further questions.  I spent 3-4 minutes counseling on smoking cessation and the health risks of continued tobacco abuse.  I explained to the patient that there has been a high incidence of coronary artery disease noted on these exams. I explained that this is a non-gated exam therefore degree or severity cannot be determined. This patient is on statin therapy. I have asked the patient to follow-up with their PCP regarding any incidental finding of coronary artery disease and management with diet or medication as their PCP  feels is clinically indicated. The patient verbalized understanding of the above and had no further questions upon completion of the visit.      Magdalen Spatz, NP 03/18/2021

## 2021-03-19 ENCOUNTER — Telehealth: Payer: Self-pay | Admitting: *Deleted

## 2021-03-19 ENCOUNTER — Telehealth: Payer: Self-pay | Admitting: Acute Care

## 2021-03-19 NOTE — Telephone Encounter (Signed)
Message from [facility] Radiology):  IMPRESSION: 1. New 6.6 mm left upper lobe nodule has a relatively flat appearance on coronal imaging, suggesting the possibility of interval scarring. Malignancy cannot be excluded. By size, lesion is categorized as Lung-RADS 4A, suspicious. Follow up low-dose chest CT without contrast in 3 months (please use the following order, "CT CHEST LCS NODULE FOLLOW-UP W/O CM") is recommended. Alternatively, PET may be considered when there is a solid component 70mm or larger. These results will be called to the ordering clinician or representative by the Radiologist Assistant, and communication documented in the PACS or [employer].  Message routed to Judson Roch and [place] with the LD CT screening.

## 2021-03-19 NOTE — Telephone Encounter (Signed)
Message from [facility] Radiology):   IMPRESSION: 1. New 6.6 mm left upper lobe nodule has a relatively flat appearance on coronal imaging, suggesting the possibility of interval scarring. Malignancy cannot be excluded. By size, lesion is categorized as Lung-RADS 4A, suspicious. Follow up low-dose chest CT without contrast in 3 months (please use the following order, "CT CHEST LCS NODULE FOLLOW-UP W/O CM") is recommended. Alternatively, PET may be considered when there is a solid component 42mm or larger. These results will be called to the ordering clinician or representative by the Radiologist Assistant, and communication documented in the PACS or [employer].   Message routed to Judson Roch and [place] with the LD CT screening.

## 2021-03-20 ENCOUNTER — Telehealth: Payer: Self-pay | Admitting: Internal Medicine

## 2021-03-20 ENCOUNTER — Other Ambulatory Visit: Payer: Self-pay | Admitting: Internal Medicine

## 2021-03-20 DIAGNOSIS — T148XXA Other injury of unspecified body region, initial encounter: Secondary | ICD-10-CM | POA: Diagnosis not present

## 2021-03-20 DIAGNOSIS — R1030 Lower abdominal pain, unspecified: Secondary | ICD-10-CM | POA: Diagnosis not present

## 2021-03-20 MED ORDER — TRELEGY ELLIPTA 100-62.5-25 MCG/INH IN AEPB: INHALATION_SPRAY | RESPIRATORY_TRACT | 10 refills | Status: DC

## 2021-03-20 NOTE — Telephone Encounter (Signed)
I called and spoke with patient regarding refill on Trelegy. I sent in refill on trelegy to preferred pharmacy. Patient verbalized understanding, nothing further needed.

## 2021-03-20 NOTE — Telephone Encounter (Signed)
We will call this through the screening program. Thanks so much

## 2021-03-24 NOTE — Progress Notes (Signed)
I have called the patient. There was no answer on both numbers listed. The mail boxes for both phones were full, and I was unable to leave a message.  Monica [name], I will try and call again tomorrow. If she calls feel free to give her the results. They recommend a 3 month follow up for a 4 A reading.  Let her know we will check her stable aortic aneurysm with next years scan. Thanks so much

## 2021-03-24 NOTE — Telephone Encounter (Signed)
Per result note- Judson Roch aware of results of screening CT Chest done 03/18/21 and they are being called through the screening program so will close encounter.

## 2021-03-26 NOTE — Progress Notes (Signed)
I have attempted to call the patient again with her low dose CT results. There was no answer at either her home or mobile number. I was able to leave a HIPPA compliant message on her home answering machine. I gave her the office contact number for a return call.  Monica [name], if we do not get a return call, and continue to be unable to get in touch with the patient we will need to send a certified letter. Thanks so much

## 2021-03-27 NOTE — Progress Notes (Signed)
I attempted to call the patient with the results of the low dose CT. There was no answer I left a HIPPA compliant message on the patient's answering machine. I provided her with the office contact number to call for her results.

## 2021-03-30 ENCOUNTER — Telehealth: Payer: Self-pay | Admitting: Acute Care

## 2021-03-30 DIAGNOSIS — F17210 Nicotine dependence, cigarettes, uncomplicated: Secondary | ICD-10-CM

## 2021-03-30 DIAGNOSIS — Z87891 Personal history of nicotine dependence: Secondary | ICD-10-CM

## 2021-03-30 DIAGNOSIS — I25111 Atherosclerotic heart disease of native coronary artery with angina pectoris with documented spasm: Secondary | ICD-10-CM | POA: Diagnosis not present

## 2021-03-30 DIAGNOSIS — I10 Essential (primary) hypertension: Secondary | ICD-10-CM | POA: Diagnosis not present

## 2021-03-30 DIAGNOSIS — E1165 Type 2 diabetes mellitus with hyperglycemia: Secondary | ICD-10-CM | POA: Diagnosis not present

## 2021-03-30 NOTE — Telephone Encounter (Signed)
Pt informed of CT results per Eric Form, NP.  PT verbalized understanding.  Copy of CT sent to PCP.  Order placed for 3 mth f/u CT.

## 2021-04-01 ENCOUNTER — Encounter: Payer: Self-pay | Admitting: Nurse Practitioner

## 2021-04-01 DIAGNOSIS — I10 Essential (primary) hypertension: Secondary | ICD-10-CM | POA: Diagnosis not present

## 2021-04-01 DIAGNOSIS — Z794 Long term (current) use of insulin: Secondary | ICD-10-CM | POA: Diagnosis not present

## 2021-04-01 DIAGNOSIS — E7800 Pure hypercholesterolemia, unspecified: Secondary | ICD-10-CM | POA: Diagnosis not present

## 2021-04-01 DIAGNOSIS — E1121 Type 2 diabetes mellitus with diabetic nephropathy: Secondary | ICD-10-CM | POA: Diagnosis not present

## 2021-04-08 NOTE — Progress Notes (Signed)
Cardiology Office Note   Date:  04/10/2021   ID:  Monica [name], DOB 02/22/1946, MRN [id number]  PCP:  Jani Gravel, MD  Cardiologist:   Minus Breeding, MD    Chief Complaint  Patient presents with   Coronary Artery Disease       History of Present Illness: Monica [name] is a 75 y.o. female who presents for evaluation of coronary artery disease. She had previous stenting to an LAD. This was in 2003 with DES.  In 2016  POET (Plain Old Exercise Treadmill) was negative.  Since I last saw her she has done well.  She has been diagnosed with mild sleep apnea with a home sleep test that I was able to find.  She is wearing CPAP at night.  The patient denies any new symptoms such as chest discomfort, neck or arm discomfort. There has been no new shortness of breath, PND or orthopnea. There have been no reported palpitations, presyncope or syncope.  She walks for exercise.  She does housework.  With this she gets none of the shortness of breath that was her previous angina.   Past Medical History:  Diagnosis Date   CAD (coronary artery disease)    Cypher stent 09/2002   Cancer ([place]) 2018   left breast, radiation therapy   CHF (congestive heart failure) ([place])    Depression    Diabetes mellitus ([place])    Hyperlipidemia    Hypertension    Myocardial infarction ([place])    2003,went into cardiac shock   Osteoporosis    Seizures ([place])    1st seizure age late 49's; most recent 07/28/17   Sinusitis     Past Surgical History:  Procedure Laterality Date   ABDOMINAL HYSTERECTOMY     APPENDECTOMY     BREAST LUMPECTOMY WITH RADIOACTIVE SEED AND SENTINEL LYMPH NODE BIOPSY Left 03/16/2017   Procedure: INJECT BLUE DYE LEFT BREAST, LEFT BREAST LUMPECTOMY WITH RADIOACTIVE SEED AND LEFT AXILLARY DEEP SENTINEL LYMPH NODE  BIOPSY ADJACENT TISSUE TRANSFER;  Surgeon: [name], MD;  Location: [facility];  Service: General;  Laterality: Left;   CARPAL TUNNEL RELEASE Bilateral    COLONOSCOPY WITH  PROPOFOL N/A 05/30/2015   Procedure: COLONOSCOPY WITH PROPOFOL;  Surgeon: Carol Ada, MD;  Location: [facility];  Service: Endoscopy;  Laterality: N/A;   COLONOSCOPY WITH PROPOFOL N/A 06/09/2018   Procedure: COLONOSCOPY WITH PROPOFOL;  Surgeon: Carol Ada, MD;  Location: [facility];  Service: Endoscopy;  Laterality: N/A;   CORONARY ANGIOPLASTY WITH STENT PLACEMENT  10-02-2002   Exploratory abdominal     Bleeding in abdomen after tubal   POLYPECTOMY  06/09/2018   Procedure: POLYPECTOMY;  Surgeon: Carol Ada, MD;  Location: [facility];  Service: Endoscopy;;   TUBAL LIGATION     UMBILICAL HERNIA REPAIR       Current Outpatient Medications  Medication Sig Dispense Refill   acetaminophen (TYLENOL) 500 MG tablet Take 1,000 mg by mouth every 6 (six) hours as needed for moderate pain or headache.     albuterol (VENTOLIN HFA) 108 (90 Base) MCG/ACT inhaler Inhale 2 puffs into the lungs every 6 (six) hours as needed for wheezing or shortness of breath. 8 g 6   anastrozole (ARIMIDEX) 1 MG tablet Take 1 tablet by mouth once daily 90 tablet 0   aspirin EC 81 MG tablet Take 81 mg by mouth at bedtime.     atorvastatin (LIPITOR) 40 MG tablet Take 40 mg by mouth at bedtime.  B-D ULTRAFINE III SHORT PEN 31G X 8 MM MISC Inject into the skin as directed.     Calcium Carbonate-Vitamin D 600-400 MG-UNIT per tablet Take 1 tablet by mouth daily.      carvedilol (COREG) 3.125 MG tablet Take 3.125 mg by mouth 2 (two) times daily.      clonazePAM (KLONOPIN) 1 MG tablet Take 1 mg by mouth at bedtime.      Fluticasone-Umeclidin-Vilant (TRELEGY ELLIPTA) 100-62.5-25 MCG/INH AEPB Inhale 1 puff then rinse mouth, once daily 60 each 10   glimepiride (AMARYL) 4 MG tablet Take 4 mg by mouth 2 (two) times daily.     halobetasol (ULTRAVATE) 0.05 % cream Apply 1 application topically 2 (two) times daily as needed (for rash).      Insulin Detemir (LEVEMIR) 100 UNIT/ML Pen Inject 17-25 Units into the skin See admin  instructions. Inject 25 units SQ in the morning and inject 17 units SQ at night     insulin lispro (HUMALOG) 100 UNIT/ML injection Inject 7 Units into the skin 2 (two) times daily with breakfast and lunch.      insulin lispro (HUMALOG) 100 UNIT/ML injection Inject 9 Units into the skin at bedtime.     levETIRAcetam (KEPPRA) 500 MG tablet Take 1 tablet (500 mg total) by mouth 2 (two) times daily. 180 tablet 3   losartan (COZAAR) 50 MG tablet Take 50 mg by mouth at bedtime.      Multiple Vitamin (MULTIVITAMIN WITH MINERALS) TABS tablet Take 1 tablet by mouth at bedtime.     Omega-3 Fatty Acids (FISH OIL) 1200 MG CAPS Take 2,400 mg by mouth 2 (two) times daily.     omeprazole (PRILOSEC) 20 MG capsule Take 20 mg by mouth at bedtime.      ONETOUCH VERIO test strip USE THREE TIMES A DAY AND AS NEEDED FOR HYPOGLYCEMIA     pioglitazone (ACTOS) 30 MG tablet Take 30 mg by mouth at bedtime.     Polyethyl Glycol-Propyl Glycol 0.4-0.3 % SOLN Place 1 drop into both eyes 2 (two) times daily.     risedronate (ACTONEL) 150 MG tablet Take 150 mg by mouth every 30 (thirty) days.     sertraline (ZOLOFT) 100 MG tablet Take 100 mg by mouth at bedtime.     TRIJARDY XR 12.5-2.02-999 MG TB24 Take 1 tablet by mouth 2 (two) times daily.     nitroGLYCERIN (NITROSTAT) 0.4 MG SL tablet Place 1 tablet (0.4 mg total) under the tongue every 5 (five) minutes as needed for chest pain. 25 tablet 2   No current facility-administered medications for this visit.    Allergies:   Ace inhibitors, Keflex [cephalexin], Phenobarbital, Sulfate, and Altace [ramipril]    ROS:  Please see the history of present illness.   Otherwise, review of systems are positive for recent none.   All other systems are reviewed and negative.    PHYSICAL EXAM: VS:  BP 100/60 (BP Location: Right Arm)   Pulse 69   Ht 5\' 5"  (1.651 m)   Wt 118 lb 12.8 oz (53.9 kg)   BMI 19.77 kg/m  , BMI Body mass index is 19.77 kg/m.  GENERAL:  Well appearing NECK:   No jugular venous distention, waveform within normal limits, carotid upstroke brisk and symmetric, no bruits, no thyromegaly LUNGS:  Clear to auscultation bilaterally CHEST:  Unremarkable HEART:  PMI not displaced or sustained,S1 and S2 within normal limits, no S3, no S4, no clicks, no rubs, no murmurs ABD:  Flat,  positive bowel sounds normal in frequency in pitch, no bruits, no rebound, no guarding, no midline pulsatile mass, no hepatomegaly, no splenomegaly EXT:  2 plus pulses throughout, no edema, no cyanosis no clubbing  EKG:  EKG is   ordered today. Sinus rhythm, rate 69, right bundle branch block, no acute ST-T wave changes.  No change from previous.  QT is  prolonged  Recent Labs:    Wt Readings from Last 3 Encounters:  04/10/21 118 lb 12.8 oz (53.9 kg)  03/18/21 118 lb (53.5 kg)  02/11/21 123 lb (55.8 kg)      Other studies Reviewed: Additional studies/ records that were reviewed today include: Labs, sleep study Review of the above records demonstrates:  NA   ASSESSMENT AND PLAN:  CAD:  The patient has no new sypmtoms.  No further cardiovascular testing is indicated.  We will continue with aggressive risk reduction and meds as listed.  DYSLIPIDEMIA:    LDL was 50 with an HDL of 31.  No change in therapy.   TOBACCO:   She is using lozenges and not currently smoking.  I applauded this and encouraged complete abstinence.   DM:    A1c was 7.2 which is up from 6.8.  However she is having this actively followed and managed by her primary provider's office.   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  I will avoid QT prolonging drugs.  SLEEP APNEA: She apparently uses CPAP at night.  Current medicines are reviewed at length with the patient today.  The patient does not have concerns regarding medicines.  The following changes have been made:  None  Labs/ tests ordered today include:  None  Orders Placed This Encounter  Procedures   EKG 12-Lead       Disposition:   FU with me in 12 months.     Signed, Minus Breeding, MD  04/10/2021 12:52 PM    [facility]

## 2021-04-10 ENCOUNTER — Other Ambulatory Visit: Payer: Self-pay

## 2021-04-10 ENCOUNTER — Encounter: Payer: Self-pay | Admitting: Cardiology

## 2021-04-10 ENCOUNTER — Ambulatory Visit: Payer: Medicare Other | Admitting: Cardiology

## 2021-04-10 VITALS — BP 100/60 | HR 69 | Ht 65.0 in | Wt 118.8 lb

## 2021-04-10 DIAGNOSIS — E785 Hyperlipidemia, unspecified: Secondary | ICD-10-CM

## 2021-04-10 DIAGNOSIS — R9431 Abnormal electrocardiogram [ECG] [EKG]: Secondary | ICD-10-CM | POA: Diagnosis not present

## 2021-04-10 DIAGNOSIS — Z720 Tobacco use: Secondary | ICD-10-CM

## 2021-04-10 DIAGNOSIS — I2510 Atherosclerotic heart disease of native coronary artery without angina pectoris: Secondary | ICD-10-CM

## 2021-04-10 DIAGNOSIS — E118 Type 2 diabetes mellitus with unspecified complications: Secondary | ICD-10-CM | POA: Diagnosis not present

## 2021-04-10 MED ORDER — NITROGLYCERIN 0.4 MG SL SUBL: 0.4000 mg | SUBLINGUAL_TABLET | SUBLINGUAL | 2 refills | Status: AC | PRN

## 2021-04-10 NOTE — Patient Instructions (Addendum)
Medication Instructions:  TAKE Nitroglycerin 0.4 mg sublingual as needed for chest pain. Take 1 tablet every 5 minutes DO NOT EXCEED MORE THAN 3 TABLETS IN AN EPISODE  *If you need a refill on your cardiac medications before your next appointment, please call your pharmacy*  Lab Work: NONE ordered at this time of appointment   If you have labs (blood work) drawn today and your tests are completely normal, you will receive your results only by: [place] (if you have MyChart) OR A paper copy in the mail If you have any lab test that is abnormal or we need to change your treatment, we will call you to review the results.  Testing/Procedures: NONE ordered at this time of appointment   Follow-Up: At [facility], you and your health needs are our priority.  As part of our continuing mission to provide you with exceptional heart care, we have created designated Provider Care Teams.  These Care Teams include your primary Cardiologist (physician) and Advanced Practice Providers (APPs -  Physician Assistants and Nurse Practitioners) who all work together to provide you with the care you need, when you need it.   Your next appointment:   1 year(s)  The format for your next appointment:   In Person  Provider:   Minus Breeding, MD  Other Instructions

## 2021-04-12 DIAGNOSIS — J449 Chronic obstructive pulmonary disease, unspecified: Secondary | ICD-10-CM | POA: Diagnosis not present

## 2021-04-14 DIAGNOSIS — J449 Chronic obstructive pulmonary disease, unspecified: Secondary | ICD-10-CM | POA: Diagnosis not present

## 2021-04-22 DIAGNOSIS — E1121 Type 2 diabetes mellitus with diabetic nephropathy: Secondary | ICD-10-CM | POA: Diagnosis not present

## 2021-04-22 DIAGNOSIS — Z794 Long term (current) use of insulin: Secondary | ICD-10-CM | POA: Diagnosis not present

## 2021-04-22 DIAGNOSIS — I10 Essential (primary) hypertension: Secondary | ICD-10-CM | POA: Diagnosis not present

## 2021-04-22 DIAGNOSIS — E7800 Pure hypercholesterolemia, unspecified: Secondary | ICD-10-CM | POA: Diagnosis not present

## 2021-04-30 ENCOUNTER — Other Ambulatory Visit: Payer: Self-pay | Admitting: Hematology

## 2021-04-30 DIAGNOSIS — E2839 Other primary ovarian failure: Secondary | ICD-10-CM

## 2021-05-13 DIAGNOSIS — J449 Chronic obstructive pulmonary disease, unspecified: Secondary | ICD-10-CM | POA: Diagnosis not present

## 2021-05-15 DIAGNOSIS — J449 Chronic obstructive pulmonary disease, unspecified: Secondary | ICD-10-CM | POA: Diagnosis not present

## 2021-05-23 ENCOUNTER — Encounter: Payer: Self-pay | Admitting: Internal Medicine

## 2021-05-23 DIAGNOSIS — J9611 Chronic respiratory failure with hypoxia: Secondary | ICD-10-CM | POA: Insufficient documentation

## 2021-05-23 NOTE — Assessment & Plan Note (Signed)
Continues to need O2 2L for sleep Plan- adding POC for portable O2 2L

## 2021-05-23 NOTE — Assessment & Plan Note (Signed)
Stable for now on Trelegy Plan- continue current meds, CXR, Low dose screening CT chest program

## 2021-06-02 ENCOUNTER — Other Ambulatory Visit: Payer: Self-pay | Admitting: Nurse Practitioner

## 2021-06-02 DIAGNOSIS — E611 Iron deficiency: Secondary | ICD-10-CM

## 2021-06-02 NOTE — Progress Notes (Signed)
[place]   [phone] Fax:([phone]) [phone]   Clinic Follow up Note   Patient Care Team: Jani Gravel, MD as PCP - General (Internal Medicine) [name], MD as Consulting Physician (General Surgery) Truitt Merle, MD as Consulting Physician (Hematology) Kyung Rudd, MD as Consulting Physician (Radiation Oncology) Gardenia Phlegm, NP as Nurse Practitioner (Hematology and Oncology) 06/03/2021  CHIEF COMPLAINT: Follow-up left breast cancer, polycythemia, and iron deficiency  SUMMARY OF ONCOLOGIC HISTORY: Oncology History Overview Note  Cancer Staging Malignant neoplasm of upper-inner quadrant of left breast in female, estrogen receptor positive ([place]) Staging form: Breast, AJCC 8th Edition - Clinical stage from 02/16/2017: Stage IA (cT1b, cN0, cM0, G2, ER: Positive, PR: Positive, HER2: Negative) - Unsigned Staging comments: Staged at breast conference  - Pathologic stage from 03/16/2017: Stage IA (pT1b, pN0, cM0, G2, ER: Positive, PR: Positive, HER2: Negative, Oncotype DX score: 27) - Signed by Truitt Merle, MD on 04/24/2017     Malignant neoplasm of upper-inner quadrant of left breast in female, estrogen receptor positive ([place])  02/09/2017 Initial Diagnosis   Malignant neoplasm of upper-inner quadrant of left breast in female, estrogen receptor positive ([place])   02/09/2017 Initial Biopsy   Diagnosis Breast, left, needle core biopsy - INVASIVE DUCTAL CARCINOMA, 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 Dalbert Batman    03/16/2017 Pathology Results   PATHOLOGY REPORT Diagnosis 03/16/17 1. Breast, lumpectomy, Left - INVASIVE DUCTAL  CARCINOMA, NOTTINGHAM GRADE 2 OF 3, 0.9 CM - DUCTAL CARCINOMA IN SITU - MARGINS UNINVOLVED BY CARCINOMA (0.3 CM POSTERIOR MARGIN) - PREVIOUS BIOPSY SITE CHANGES - SEE ONCOLOGY TABLE BELOW 2. Lymph node, sentinel, biopsy, Left axillary #1 - NO CARCINOMA IDENTIFIED IN ONE LYMPH NODE (0/1) 3. Lymph node, sentinel, biopsy, Left axillary #2 - NO CARCINOMA IDENTIFIED IN ONE LYMPH NODE (0/1) 4. Lymph node, sentinel, biopsy, Left axillary #3 - NO CARCINOMA IDENTIFIED IN ONE LYMPH NODE (0/1) 5. Lymph node, sentinel, biopsy, Left axillary #4 - NO CARCINOMA IDENTIFIED IN ONE LYMPH NODE (0/1)    03/16/2017 Oncotype testing   Recurrance score of 27 with a 18% distance recurrance in the net 10 years with Tamoxifen alone    05/19/2017 - 06/16/2017 Radiation Therapy   Radiation with Dr. Lisbeth Renshaw   06/2017 -  Anti-estrogen oral therapy   anastrozole 1 mg once a day starting late 06/2017     04/29/2020 Pathology Results   DIAGNOSIS:   BONE MARROW, ASPIRATE, CLOT, CORE:  -  Cellular marrow with trilineage hematopoiesis  -  No morphologic evidence of carcinoma or hematopoietic neoplasm  -  See microscopic description below   PERIPHERAL BLOOD:  -  Polycythemia and thrombocytopenia  -  See complete blood cell count   MICROSCOPIC DESCRIPTION:   PERIPHERAL BLOOD SMEAR: The peripheral blood has a polycythemia and  thrombocytopenia.  Leukocytes are morphologically unremarkable.   BONE MARROW ASPIRATE: Spicular, cellular and adequate for evaluation  Erythroid precursors: Orderly maturation without overt dysplasia  Granulocytic precursors: Orderly maturation without overt dysplasia  Megakaryocytes: Qualitatively and quantitatively unremarkable  Lymphocytes/plasma cells: No lymphocytosis or plasmacytosis      CURRENT THERAPY: Anastrozole 1 mg once daily starting 06/2017  INTERVAL HISTORY: Monica [name] returns for  follow-up as scheduled.  Monica [name] was last seen in person by Dr. Burr Medico 05/05/2020 and virtually by me  12/02/2020.  Monica [name] was recommended to start oral iron 1 tab twice daily at that time, labs 03/02/2021 showed persistent iron deficiency TIBC 448, 11% saturation.  Monica [name] did not tolerate oral iron, I tried multiple times to reach her to discuss IV iron but failed to connect.  Today, Monica [name] presents by herself, doing well overall.  Monica [name] had a recent mammogram at [facility] which was fine.  Denies changes in her health.  Tolerating anastrozole with stiffness in her hands and left shoulder ache, stable.  Denies new or worsening bone pain.  Denies hot flashes.  Anxiety and depression fluctuate but mood is overall stable, on medication.  Monica [name] attributes to life stress.  Monica [name] declined social work referral.  Denies GERD symptoms, GI bleeding, abdominal pain or bloating.  Denies recent fever or chills.  Monica [name] has cut back to 2-3 cigarettes/day with nicotine lozenges.   All other systems were reviewed with the patient and are negative.  MEDICAL HISTORY:  Past Medical History:  Diagnosis Date   CAD (coronary artery disease)    Cypher stent 09/2002   Cancer (August) 2018   left breast, radiation therapy   CHF (congestive heart failure) ([place])    Depression    Diabetes mellitus ([place])    Hyperlipidemia    Hypertension    Myocardial infarction ([place])    2003,went into cardiac shock   Osteoporosis    Seizures ([place])    1st seizure age late 4's; most recent 07/28/17   Sinusitis     SURGICAL HISTORY: Past Surgical History:  Procedure Laterality Date   ABDOMINAL HYSTERECTOMY     APPENDECTOMY     BREAST LUMPECTOMY WITH RADIOACTIVE SEED AND SENTINEL LYMPH NODE BIOPSY Left 03/16/2017   Procedure: INJECT BLUE DYE LEFT BREAST, LEFT BREAST LUMPECTOMY WITH RADIOACTIVE SEED AND LEFT AXILLARY DEEP SENTINEL LYMPH NODE  BIOPSY ADJACENT TISSUE TRANSFER;  Surgeon: [name], MD;  Location: [facility];  Service: General;  Laterality: Left;   CARPAL TUNNEL RELEASE Bilateral    COLONOSCOPY WITH PROPOFOL N/A 05/30/2015   Procedure:  COLONOSCOPY WITH PROPOFOL;  Surgeon: Carol Ada, MD;  Location: [facility];  Service: Endoscopy;  Laterality: N/A;   COLONOSCOPY WITH PROPOFOL N/A 06/09/2018   Procedure: COLONOSCOPY WITH PROPOFOL;  Surgeon: Carol Ada, MD;  Location: [facility];  Service: Endoscopy;  Laterality: N/A;   CORONARY ANGIOPLASTY WITH STENT PLACEMENT  10-02-2002   Exploratory abdominal     Bleeding in abdomen after tubal   POLYPECTOMY  06/09/2018   Procedure: POLYPECTOMY;  Surgeon: Carol Ada, MD;  Location: [facility];  Service: Endoscopy;;   TUBAL LIGATION     UMBILICAL HERNIA REPAIR      I have reviewed the social history and family history with the patient and they are unchanged from previous note.  ALLERGIES:  is allergic to ace inhibitors, keflex [cephalexin], phenobarbital, sulfate, and altace [ramipril].  MEDICATIONS:  Current Outpatient Medications  Medication Sig Dispense Refill   acetaminophen (TYLENOL) 500 MG tablet Take 1,000 mg by mouth every 6 (six) hours as needed for moderate pain or headache.     albuterol (VENTOLIN HFA) 108 (90 Base) MCG/ACT inhaler Inhale 2 puffs into the lungs every 6 (six) hours as needed for wheezing or shortness of breath. 8 g 6   anastrozole (ARIMIDEX) 1 MG tablet Take 1 tablet (1 mg total) by mouth daily. 90 tablet 3   aspirin EC  81 MG tablet Take 81 mg by mouth at bedtime.     atorvastatin (LIPITOR) 40 MG tablet Take 40 mg by mouth at bedtime.     B-D ULTRAFINE III SHORT PEN 31G X 8 MM MISC Inject into the skin as directed.     Calcium Carbonate-Vitamin D 600-400 MG-UNIT per tablet Take 1 tablet by mouth daily.      carvedilol (COREG) 3.125 MG tablet Take 3.125 mg by mouth 2 (two) times daily.      clonazePAM (KLONOPIN) 1 MG tablet Take 1 mg by mouth at bedtime.      Fluticasone-Umeclidin-Vilant (TRELEGY ELLIPTA) 100-62.5-25 MCG/INH AEPB Inhale 1 puff then rinse mouth, once daily 60 each 10   glimepiride (AMARYL) 4 MG tablet Take 4 mg by mouth 2 (two)  times daily.     halobetasol (ULTRAVATE) 0.05 % cream Apply 1 application topically 2 (two) times daily as needed (for rash).      Insulin Detemir (LEVEMIR) 100 UNIT/ML Pen Inject 17-25 Units into the skin See admin instructions. Inject 25 units SQ in the morning and inject 17 units SQ at night     insulin lispro (HUMALOG) 100 UNIT/ML injection Inject 7 Units into the skin 2 (two) times daily with breakfast and lunch.      insulin lispro (HUMALOG) 100 UNIT/ML injection Inject 9 Units into the skin at bedtime.     levETIRAcetam (KEPPRA) 500 MG tablet Take 1 tablet (500 mg total) by mouth 2 (two) times daily. 180 tablet 3   losartan (COZAAR) 50 MG tablet Take 50 mg by mouth at bedtime.      Multiple Vitamin (MULTIVITAMIN WITH MINERALS) TABS tablet Take 1 tablet by mouth at bedtime.     nitroGLYCERIN (NITROSTAT) 0.4 MG SL tablet Place 1 tablet (0.4 mg total) under the tongue every 5 (five) minutes as needed for chest pain. 25 tablet 2   Omega-3 Fatty Acids (FISH OIL) 1200 MG CAPS Take 2,400 mg by mouth 2 (two) times daily.     omeprazole (PRILOSEC) 20 MG capsule Take 20 mg by mouth at bedtime.      ONETOUCH VERIO test strip USE THREE TIMES A DAY AND AS NEEDED FOR HYPOGLYCEMIA     pioglitazone (ACTOS) 30 MG tablet Take 30 mg by mouth at bedtime.     Polyethyl Glycol-Propyl Glycol 0.4-0.3 % SOLN Place 1 drop into both eyes 2 (two) times daily.     risedronate (ACTONEL) 150 MG tablet Take 150 mg by mouth every 30 (thirty) days.     sertraline (ZOLOFT) 100 MG tablet Take 100 mg by mouth at bedtime.     TRIJARDY XR 12.5-2.02-999 MG TB24 Take 1 tablet by mouth 2 (two) times daily.     No current facility-administered medications for this visit.    PHYSICAL EXAMINATION: ECOG PERFORMANCE STATUS: 0 - Asymptomatic  Vitals:   06/03/21 1028  BP: 111/64  Pulse: 60  Resp: 17  Temp: 97.8 F (36.6 C)  SpO2: 98%   Filed Weights   06/03/21 1028  Weight: 119 lb 1.6 oz (54 kg)    GENERAL:alert, no  distress and comfortable SKIN: No rash EYES: sclera clear NECK: Without mass LYMPH:  no palpable cervical or supraclavicular lymphadenopathy LUNGS: Decreased, normal breathing effort HEART: regular rate & rhythm, no lower extremity edema ABDOMEN:abdomen soft, non-tender and normal bowel sounds Musculoskeletal: Nonfocal NEURO: alert & oriented x 3 with fluent speech Breast exam: s/p left lumpectomy, surgical incision completely healed.  No palpable mass in  either breast or axilla that I could appreciate.  LABORATORY DATA:  I have reviewed the data as listed CBC Latest Ref Rng & Units 06/03/2021 06/03/2021 03/02/2021  WBC 4.0 - 10.5 K/uL 6.5 6.5 6.7  Hemoglobin 12.0 - 15.0 g/dL 15.1(H) 15.2(H) 16.0(H)  Hematocrit 36.0 - 46.0 % 48.9(H) 48.3(H) 51.1(H)  Platelets 150 - 400 K/uL 144(L) 129(L) 130(L)     CMP Latest Ref Rng & Units 06/03/2021 03/02/2021 12/02/2020  Glucose 70 - 99 mg/dL 142(H) 170(H) 150(H)  BUN 8 - 23 mg/dL _0 Creatinine 0.44 - 1.00 mg/dL 0.79 0.73 0.75  Sodium 135 - 145 mmol/L 137 137 138  Potassium 3.5 - 5.1 mmol/L 4.4 4.3 4.3  Chloride 98 - 111 mmol/L 99 101 101  CO2 22 - 32 mmol/L _1 Calcium 8.9 - 10.3 mg/dL 10.1 10.4(H) 10.2  Total Protein 6.5 - 8.1 g/dL 7.4 7.5 7.9  Total Bilirubin 0.3 - 1.2 mg/dL 0.5 0.5 0.6  Alkaline Phos 38 - 126 U/L 64 79 60  AST 15 - 41 U/L _2 ALT 0 - 44 U/L _3 RADIOGRAPHIC STUDIES: I have personally reviewed the radiological images as listed and agreed with the findings in the report. No results found.   ASSESSMENT & PLAN: Monica [name] is a 75 y.o. female with      1. 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 Tolerating well.  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 Work up showed elevated erythropoietin which supports secondary polycythemia.  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 Overall not definitve for a MPN diagnosis.  -US abdomen 04/11/20 shows mild splenomegaly, fatty liver and benign cystic liver lesions.  -Bone marrow biopsy from 04/29/20 showed no morphologic evidence of carcinoma or hematopoietic neoplasm.  -Overall work up indicates polycythemia secondary to her heavy smoking history and COPD. Monica [name] has been encouraged to quit  -no phlebotomy or medical treatment indicated for secondary polycythemia -no signs of thrombosis. Continue smoking cessation -Monica [name] cut back smoking, polycythemia has improved, Hgb 15.2 down from 16.6 and hematocrit 51.9  3. 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 Iron deficiency without anemia -Her 2021 labs showed evidence of iron deficiency, no clinical evidence of bleeding  -colonoscopy 05/2018 showed diverticulosis and had 3-4 polyps removed which can lead to bleeding.  -Monica [name] was recommended to start oral iron but did not.  -12/02/20 labs show ferritin 10, TIBC 504, 14% sat which is consistent with iron deficiency.  -I recommended to try oral iron but Monica [name] did not tolerate -Iron studies are pending from today, will arrange IV iron.  Monica [name] agrees to proceed   5. Osteopenia  -Continue calcium and vitamin  D -Based on her DEXA from 06/30/17 Monica [name] is osteopenic with a left femur neck T-Score of -2.4  -Monica [name] has started risedronate by her PCP, tolerating well -DEXA was not done in 2020, I have requested most recent scan from [facility]   6. Smoking and lung nodule, Emphysema  -Monica [name] has heavy smoking history since in her late 75s, has cut cigarettes to half pack a day -low-dose CT scan 02/2018 for lung cancer screening showed several small pulmonary nodules. -Screening CT Chest from 12/27/19 was negative for malignancy but does show emphysema in her lungs. 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 Monica [name] also has mild thrombocytopenia.  -Sleep study 07/2020 showed sleep apnea with desat to 76%, patient reportedly started on nocturnal oxygen -Monica [name] has cut back considerably, smoking 2-3 cigarettes/day -Screening CT chest 03/2021 showed a new 6.6 mm lung nodule, repeat CT 06/2021 -f/u with Dr Annamaria Boots     7. Comorbidities: DM, Depression and mood swing -Monica [name] is on Zoloft 100 mg daily, mostly stable mood.  -Anxiety and depression fluctuate, Monica [name] declined social work referral today  -Continue med regimen and F/up PCP  Disposition: Monica [name] is clinically doing well.  Monica [name] continues anastrozole, tolerating well with mild joint stiffness.  Breast exam is benign, recent mammogram and DEXA have been requested.  Overall no clinical concern for breast cancer recurrence.  Monica [name] is over 4 years from initial diagnosis, continue surveillance and AI, next visit in 6 months.  Labs reviewed.  Polycythemia has improved since Monica [name] cut back smoking.  Lung cancer screening CT chest 03/18/2021 showed a new 6.6 mm left upper lobe nodule that is being followed closely, repeat CT in 06/2021 per PCP.  CMP is stable.  Labs are consistent with iron deficiency without anemia, TIBC 461, 11% saturation, ferritin is pending from today but was 10 in 11/2020 and Monica [name] has not been on oral iron since then. Monica [name] denies bleeding.  The source of  her iron deficiency is unknown.  Last colonoscopy in 2019 with 3-5-year recall, I will refer her back to Dr. Benson Norway.  Monica [name] did not tolerate oral iron due to GI side effects. I recommend IV Venofer weekly x3. We reviewed potential benefit and side effects including constipation, black stool, and allergic reaction.  Monica [name] agrees to proceed.  We will see her back in 3 months with lab to see how Monica [name] has responded to iron replacement.  All questions were answered. The patient knows to call the clinic with any problems, questions or concerns. No barriers to learning were detected.  Total encounter time was 30 minutes.     Alla Feeling, NP 06/03/21

## 2021-06-03 ENCOUNTER — Inpatient Hospital Stay: Payer: Medicare Other | Attending: Nurse Practitioner | Admitting: Nurse Practitioner

## 2021-06-03 ENCOUNTER — Encounter: Payer: Self-pay | Admitting: Nurse Practitioner

## 2021-06-03 ENCOUNTER — Other Ambulatory Visit: Payer: Self-pay

## 2021-06-03 ENCOUNTER — Inpatient Hospital Stay: Payer: Medicare Other

## 2021-06-03 DIAGNOSIS — D696 Thrombocytopenia, unspecified: Secondary | ICD-10-CM | POA: Diagnosis not present

## 2021-06-03 DIAGNOSIS — F17210 Nicotine dependence, cigarettes, uncomplicated: Secondary | ICD-10-CM | POA: Diagnosis not present

## 2021-06-03 DIAGNOSIS — D751 Secondary polycythemia: Secondary | ICD-10-CM | POA: Diagnosis not present

## 2021-06-03 DIAGNOSIS — Z79811 Long term (current) use of aromatase inhibitors: Secondary | ICD-10-CM | POA: Insufficient documentation

## 2021-06-03 DIAGNOSIS — Z170 Estrogen receptor positive status [ER+]: Secondary | ICD-10-CM | POA: Diagnosis not present

## 2021-06-03 DIAGNOSIS — E2839 Other primary ovarian failure: Secondary | ICD-10-CM

## 2021-06-03 DIAGNOSIS — E611 Iron deficiency: Secondary | ICD-10-CM

## 2021-06-03 DIAGNOSIS — C50212 Malignant neoplasm of upper-inner quadrant of left female breast: Secondary | ICD-10-CM | POA: Insufficient documentation

## 2021-06-03 DIAGNOSIS — M8580 Other specified disorders of bone density and structure, unspecified site: Secondary | ICD-10-CM | POA: Diagnosis not present

## 2021-06-03 LAB — CBC WITH DIFFERENTIAL (CANCER CENTER ONLY)
Abs Immature Granulocytes: 0.02 10*3/uL (ref 0.00–0.07)
Basophils Absolute: 0 10*3/uL (ref 0.0–0.1)
Basophils Relative: 0 %
Eosinophils Absolute: 0.2 10*3/uL (ref 0.0–0.5)
Eosinophils Relative: 2 %
HCT: 48.9 % — ABNORMAL HIGH (ref 36.0–46.0)
Hemoglobin: 15.1 g/dL — ABNORMAL HIGH (ref 12.0–15.0)
Immature Granulocytes: 0 %
Lymphocytes Relative: 26 %
Lymphs Abs: 1.7 10*3/uL (ref 0.7–4.0)
MCH: 22.6 pg — ABNORMAL LOW (ref 26.0–34.0)
MCHC: 30.9 g/dL (ref 30.0–36.0)
MCV: 73.3 fL — ABNORMAL LOW (ref 80.0–100.0)
Monocytes Absolute: 0.4 10*3/uL (ref 0.1–1.0)
Monocytes Relative: 6 %
Neutro Abs: 4.2 10*3/uL (ref 1.7–7.7)
Neutrophils Relative %: 66 %
Platelet Count: 144 10*3/uL — ABNORMAL LOW (ref 150–400)
RBC: 6.67 MIL/uL — ABNORMAL HIGH (ref 3.87–5.11)
RDW: 17.8 % — ABNORMAL HIGH (ref 11.5–15.5)
WBC Count: 6.5 10*3/uL (ref 4.0–10.5)
nRBC: 0 % (ref 0.0–0.2)

## 2021-06-03 LAB — FERRITIN: Ferritin: 8 ng/mL — ABNORMAL LOW (ref 11–307)

## 2021-06-03 LAB — COMPREHENSIVE METABOLIC PANEL
ALT: 16 U/L (ref 0–44)
AST: 17 U/L (ref 15–41)
Albumin: 4.3 g/dL (ref 3.5–5.0)
Alkaline Phosphatase: 64 U/L (ref 38–126)
Anion gap: 11 (ref 5–15)
BUN: 12 mg/dL (ref 8–23)
CO2: 27 mmol/L (ref 22–32)
Calcium: 10.1 mg/dL (ref 8.9–10.3)
Chloride: 99 mmol/L (ref 98–111)
Creatinine, Ser: 0.79 mg/dL (ref 0.44–1.00)
GFR, Estimated: >60 mL/min (ref >60)
Glucose, Bld: 142 mg/dL — ABNORMAL HIGH (ref 70–99)
Potassium: 4.4 mmol/L (ref 3.5–5.1)
Sodium: 137 mmol/L (ref 135–145)
Total Bilirubin: 0.5 mg/dL (ref 0.3–1.2)
Total Protein: 7.4 g/dL (ref 6.5–8.1)

## 2021-06-03 LAB — CBC
HCT: 48.3 % — ABNORMAL HIGH (ref 36.0–46.0)
Hemoglobin: 15.2 g/dL — ABNORMAL HIGH (ref 12.0–15.0)
MCH: 22.9 pg — ABNORMAL LOW (ref 26.0–34.0)
MCHC: 31.5 g/dL (ref 30.0–36.0)
MCV: 72.7 fL — ABNORMAL LOW (ref 80.0–100.0)
Platelets: 129 10*3/uL — ABNORMAL LOW (ref 150–400)
RBC: 6.64 MIL/uL — ABNORMAL HIGH (ref 3.87–5.11)
RDW: 17.7 % — ABNORMAL HIGH (ref 11.5–15.5)
WBC: 6.5 10*3/uL (ref 4.0–10.5)
nRBC: 0 % (ref 0.0–0.2)

## 2021-06-03 LAB — IRON AND TIBC
Iron: 49 ug/dL (ref 41–142)
Saturation Ratios: 11 % — ABNORMAL LOW (ref 21–57)
TIBC: 461 ug/dL — ABNORMAL HIGH (ref 236–444)
UIBC: 413 ug/dL — ABNORMAL HIGH (ref 120–384)

## 2021-06-03 MED ORDER — ANASTROZOLE 1 MG PO TABS: 1.0000 mg | ORAL_TABLET | Freq: Every day | ORAL | 3 refills | Status: DC

## 2021-06-05 ENCOUNTER — Telehealth: Payer: Self-pay | Admitting: Nurse Practitioner

## 2021-06-05 NOTE — Telephone Encounter (Signed)
Left message with follow-up appointment per 8/24 los. 

## 2021-06-06 ENCOUNTER — Encounter: Payer: Self-pay | Admitting: Nurse Practitioner

## 2021-06-08 ENCOUNTER — Telehealth: Payer: Self-pay | Admitting: Hematology

## 2021-06-08 NOTE — Telephone Encounter (Signed)
Scheduled follow-up appointments per 8/24 los. Patient is aware. 

## 2021-06-10 ENCOUNTER — Inpatient Hospital Stay: Payer: Medicare Other

## 2021-06-10 ENCOUNTER — Other Ambulatory Visit: Payer: Self-pay

## 2021-06-10 VITALS — BP 112/73 | HR 63 | Temp 97.6°F | Resp 18

## 2021-06-10 DIAGNOSIS — E611 Iron deficiency: Secondary | ICD-10-CM

## 2021-06-10 DIAGNOSIS — M8580 Other specified disorders of bone density and structure, unspecified site: Secondary | ICD-10-CM | POA: Diagnosis not present

## 2021-06-10 DIAGNOSIS — Z170 Estrogen receptor positive status [ER+]: Secondary | ICD-10-CM | POA: Diagnosis not present

## 2021-06-10 DIAGNOSIS — Z79811 Long term (current) use of aromatase inhibitors: Secondary | ICD-10-CM | POA: Diagnosis not present

## 2021-06-10 DIAGNOSIS — F17210 Nicotine dependence, cigarettes, uncomplicated: Secondary | ICD-10-CM | POA: Diagnosis not present

## 2021-06-10 DIAGNOSIS — D751 Secondary polycythemia: Secondary | ICD-10-CM | POA: Diagnosis not present

## 2021-06-10 DIAGNOSIS — D696 Thrombocytopenia, unspecified: Secondary | ICD-10-CM | POA: Diagnosis not present

## 2021-06-10 DIAGNOSIS — C50212 Malignant neoplasm of upper-inner quadrant of left female breast: Secondary | ICD-10-CM | POA: Diagnosis not present

## 2021-06-10 MED ORDER — SODIUM CHLORIDE 0.9 % IV SOLN
300.0000 mg | Freq: Once | INTRAVENOUS | Status: AC
  Administered 2021-06-10: 300 mg via INTRAVENOUS
  Filled 2021-06-10: qty 300

## 2021-06-10 MED ORDER — SODIUM CHLORIDE 0.9 % IV SOLN: Freq: Once | INTRAVENOUS | Status: AC

## 2021-06-10 NOTE — Progress Notes (Signed)
Pt discharged in stable condition 30 mins post iron infusion. Pt educated on iron, follow ups, and when to call. Pt agreeable to call with any questions or concerns, of which she had none at this time. VSS and pt ambulatory to lobby.

## 2021-06-10 NOTE — Patient Instructions (Signed)
[facility] ONCOLOGY  Discharge Instructions: Thank you for choosing [facility] to provide your oncology and hematology care.   If you have a lab appointment with the [facility], please go directly to the [facility] and check in at the registration area.   Wear comfortable clothing and clothing appropriate for easy access to any Portacath or PICC line.   We strive to give you quality time with your provider. You may need to reschedule your appointment if you arrive late (15 or more minutes).  Arriving late affects you and other patients whose appointments are after yours.  Also, if you miss three or more appointments without notifying the office, you may be dismissed from the clinic at the provider's discretion.      For prescription refill requests, have your pharmacy contact our office and allow 72 hours for refills to be completed.      To help prevent nausea and vomiting after your treatment, we encourage you to take your nausea medication as directed.  BELOW ARE SYMPTOMS THAT SHOULD BE REPORTED IMMEDIATELY: *FEVER GREATER THAN 100.4 F (38 C) OR HIGHER *CHILLS OR SWEATING *NAUSEA AND VOMITING THAT IS NOT CONTROLLED WITH YOUR NAUSEA MEDICATION *UNUSUAL SHORTNESS OF BREATH *UNUSUAL BRUISING OR BLEEDING *URINARY PROBLEMS (pain or burning when urinating, or frequent urination) *BOWEL PROBLEMS (unusual diarrhea, constipation, pain near the anus) TENDERNESS IN MOUTH AND THROAT WITH OR WITHOUT PRESENCE OF ULCERS (sore throat, sores in mouth, or a toothache) UNUSUAL RASH, SWELLING OR PAIN  UNUSUAL VAGINAL DISCHARGE OR ITCHING   Items with * indicate a potential emergency and should be followed up as soon as possible or go to the Emergency Department if any problems should occur.  Please show the CHEMOTHERAPY ALERT CARD or IMMUNOTHERAPY ALERT CARD at check-in to the Emergency Department and triage nurse.  Should you have questions after your visit  or need to cancel or reschedule your appointment, please contact [facility]  Dept: [phone]  and follow the prompts.  Office hours are 8:00 a.m. to 4:30 p.m. Monday - Friday. Please note that voicemails left after 4:00 p.m. may not be returned until the following business day.  We are closed weekends and major holidays. You have access to a nurse at all times for urgent questions. Please call the main number to the clinic Dept: [phone] and follow the prompts.   For any non-urgent questions, you may also contact your provider using MyChart. We now offer e-Visits for anyone 29 and older to request care online for non-urgent symptoms. For details visit [email or web address].   Also download the MyChart app! Go to the app store, search "MyChart", open the app, select [facility], and log in with your MyChart username and password.  Due to Covid, a mask is required upon entering the hospital/clinic. If you do not have a mask, one will be given to you upon arrival. For doctor visits, patients may have 1 support person aged 40 or older with them. For treatment visits, patients cannot have anyone with them due to current Covid guidelines and our immunocompromised population.   Iron Sucrose Injection What is this medication? IRON SUCROSE (EYE ern SOO krose) treats low levels of iron (iron deficiency anemia) in people with kidney disease. Iron is a mineral that plays an important role in making red blood cells, which carry oxygen from your lungs to the rest of your body. This medicine may be used for other purposes; ask your  health care provider or pharmacist if you have questions. COMMON BRAND NAME(S): Venofer What should I tell my care team before I take this medication? They need to know if you have any of these conditions: Anemia not caused by low iron levels Heart disease High levels of iron in the blood Kidney disease Liver disease An unusual or allergic  reaction to iron, other medications, foods, dyes, or preservatives Pregnant or trying to get pregnant Breast-feeding How should I use this medication? This medication is for infusion into a vein. It is given in a hospital or clinic setting. Talk to your care team about the use of this medication in children. While this medication may be prescribed for children as young as 2 years for selected conditions, precautions do apply. Overdosage: If you think you have taken too much of this medicine contact a poison control center or emergency room at once. NOTE: This medicine is only for you. Do not share this medicine with others. What if I miss a dose? It is important not to miss your dose. Call your care team if you are unable to keep an appointment. What may interact with this medication? Do not take this medication with any of the following: Deferoxamine Dimercaprol Other iron products This medication may also interact with the following: Chloramphenicol Deferasirox This list may not describe all possible interactions. Give your health care provider a list of all the medicines, herbs, non-prescription drugs, or dietary supplements you use. Also tell them if you smoke, drink alcohol, or use illegal drugs. Some items may interact with your medicine. What should I watch for while using this medication? Visit your care team regularly. Tell your care team if your symptoms do not start to get better or if they get worse. You may need blood work done while you are taking this medication. You may need to follow a special diet. Talk to your care team. Foods that contain iron include: whole grains/cereals, dried fruits, beans, or peas, leafy green vegetables, and organ meats (liver, kidney). What side effects may I notice from receiving this medication? Side effects that you should report to your care team as soon as possible: Allergic reactions-skin rash, itching, hives, swelling of the face, lips,  tongue, or throat Low blood pressure-dizziness, feeling faint or lightheaded, blurry vision Shortness of breath Side effects that usually do not require medical attention (report to your care team if they continue or are bothersome): Flushing Headache Joint pain Muscle pain Nausea Pain, redness, or irritation at injection site This list may not describe all possible side effects. Call your doctor for medical advice about side effects. You may report side effects to FDA at 1-800-FDA-1088. Where should I keep my medication? This medication is given in a hospital or clinic and will not be stored at home. NOTE: This sheet is a summary. It may not cover all possible information. If you have questions about this medicine, talk to your doctor, pharmacist, or health care provider.  2022 Elsevier/Gold Standard (2020-12-23 12:52:06)

## 2021-06-13 DIAGNOSIS — J449 Chronic obstructive pulmonary disease, unspecified: Secondary | ICD-10-CM | POA: Diagnosis not present

## 2021-06-15 DIAGNOSIS — J449 Chronic obstructive pulmonary disease, unspecified: Secondary | ICD-10-CM | POA: Diagnosis not present

## 2021-06-20 ENCOUNTER — Other Ambulatory Visit: Payer: Self-pay

## 2021-06-20 ENCOUNTER — Inpatient Hospital Stay: Payer: Medicare Other | Attending: Nurse Practitioner

## 2021-06-20 VITALS — BP 108/68 | HR 89 | Temp 97.7°F | Resp 18

## 2021-06-20 DIAGNOSIS — Z170 Estrogen receptor positive status [ER+]: Secondary | ICD-10-CM | POA: Insufficient documentation

## 2021-06-20 DIAGNOSIS — D751 Secondary polycythemia: Secondary | ICD-10-CM | POA: Insufficient documentation

## 2021-06-20 DIAGNOSIS — Q782 Osteopetrosis: Secondary | ICD-10-CM | POA: Insufficient documentation

## 2021-06-20 DIAGNOSIS — Z79899 Other long term (current) drug therapy: Secondary | ICD-10-CM | POA: Insufficient documentation

## 2021-06-20 DIAGNOSIS — E611 Iron deficiency: Secondary | ICD-10-CM | POA: Diagnosis not present

## 2021-06-20 DIAGNOSIS — D696 Thrombocytopenia, unspecified: Secondary | ICD-10-CM | POA: Diagnosis not present

## 2021-06-20 DIAGNOSIS — C50212 Malignant neoplasm of upper-inner quadrant of left female breast: Secondary | ICD-10-CM | POA: Diagnosis not present

## 2021-06-20 MED ORDER — SODIUM CHLORIDE 0.9 % IV SOLN: Freq: Once | INTRAVENOUS | Status: AC

## 2021-06-20 MED ORDER — SODIUM CHLORIDE 0.9 % IV SOLN
300.0000 mg | Freq: Once | INTRAVENOUS | Status: AC
  Administered 2021-06-20: 300 mg via INTRAVENOUS
  Filled 2021-06-20: qty 300

## 2021-06-20 NOTE — Patient Instructions (Signed)

## 2021-06-22 DIAGNOSIS — D696 Thrombocytopenia, unspecified: Secondary | ICD-10-CM | POA: Diagnosis not present

## 2021-06-22 DIAGNOSIS — Z79899 Other long term (current) drug therapy: Secondary | ICD-10-CM | POA: Diagnosis not present

## 2021-06-22 DIAGNOSIS — R7989 Other specified abnormal findings of blood chemistry: Secondary | ICD-10-CM | POA: Diagnosis not present

## 2021-06-22 DIAGNOSIS — E1165 Type 2 diabetes mellitus with hyperglycemia: Secondary | ICD-10-CM | POA: Diagnosis not present

## 2021-06-22 DIAGNOSIS — I10 Essential (primary) hypertension: Secondary | ICD-10-CM | POA: Diagnosis not present

## 2021-06-26 MED FILL — Iron Sucrose Inj 20 MG/ML (Fe Equiv): INTRAVENOUS | Qty: 15 | Status: AC

## 2021-06-27 ENCOUNTER — Inpatient Hospital Stay: Payer: Medicare Other

## 2021-06-27 ENCOUNTER — Other Ambulatory Visit: Payer: Self-pay

## 2021-06-27 VITALS — BP 104/58 | HR 64 | Temp 98.1°F | Resp 16

## 2021-06-27 DIAGNOSIS — Z170 Estrogen receptor positive status [ER+]: Secondary | ICD-10-CM | POA: Diagnosis not present

## 2021-06-27 DIAGNOSIS — Q782 Osteopetrosis: Secondary | ICD-10-CM | POA: Diagnosis not present

## 2021-06-27 DIAGNOSIS — C50212 Malignant neoplasm of upper-inner quadrant of left female breast: Secondary | ICD-10-CM | POA: Diagnosis not present

## 2021-06-27 DIAGNOSIS — E611 Iron deficiency: Secondary | ICD-10-CM

## 2021-06-27 DIAGNOSIS — D696 Thrombocytopenia, unspecified: Secondary | ICD-10-CM | POA: Diagnosis not present

## 2021-06-27 DIAGNOSIS — Z79899 Other long term (current) drug therapy: Secondary | ICD-10-CM | POA: Diagnosis not present

## 2021-06-27 DIAGNOSIS — D751 Secondary polycythemia: Secondary | ICD-10-CM | POA: Diagnosis not present

## 2021-06-27 MED ORDER — IRON SUCROSE 20 MG/ML IV SOLN
300.0000 mg | Freq: Once | INTRAVENOUS | Status: AC
  Administered 2021-06-27: 300 mg via INTRAVENOUS
  Filled 2021-06-27: qty 300

## 2021-06-27 MED ORDER — SODIUM CHLORIDE 0.9 % IV SOLN
Freq: Once | INTRAVENOUS | Status: AC
Start: 2021-06-27 — End: 2021-06-27

## 2021-06-27 NOTE — Patient Instructions (Signed)

## 2021-06-29 DIAGNOSIS — R569 Unspecified convulsions: Secondary | ICD-10-CM | POA: Diagnosis not present

## 2021-06-29 DIAGNOSIS — E7800 Pure hypercholesterolemia, unspecified: Secondary | ICD-10-CM | POA: Diagnosis not present

## 2021-06-29 DIAGNOSIS — R918 Other nonspecific abnormal finding of lung field: Secondary | ICD-10-CM | POA: Diagnosis not present

## 2021-06-29 DIAGNOSIS — E1165 Type 2 diabetes mellitus with hyperglycemia: Secondary | ICD-10-CM | POA: Diagnosis not present

## 2021-06-29 DIAGNOSIS — Z23 Encounter for immunization: Secondary | ICD-10-CM | POA: Diagnosis not present

## 2021-06-29 DIAGNOSIS — F17219 Nicotine dependence, cigarettes, with unspecified nicotine-induced disorders: Secondary | ICD-10-CM | POA: Diagnosis not present

## 2021-06-29 DIAGNOSIS — K219 Gastro-esophageal reflux disease without esophagitis: Secondary | ICD-10-CM | POA: Diagnosis not present

## 2021-06-29 DIAGNOSIS — I10 Essential (primary) hypertension: Secondary | ICD-10-CM | POA: Diagnosis not present

## 2021-06-29 DIAGNOSIS — D696 Thrombocytopenia, unspecified: Secondary | ICD-10-CM | POA: Diagnosis not present

## 2021-06-29 DIAGNOSIS — E1121 Type 2 diabetes mellitus with diabetic nephropathy: Secondary | ICD-10-CM | POA: Diagnosis not present

## 2021-07-01 ENCOUNTER — Other Ambulatory Visit: Payer: Self-pay | Admitting: Gastroenterology

## 2021-07-01 DIAGNOSIS — E1165 Type 2 diabetes mellitus with hyperglycemia: Secondary | ICD-10-CM | POA: Diagnosis not present

## 2021-07-01 DIAGNOSIS — Z86010 Personal history of colonic polyps: Secondary | ICD-10-CM | POA: Diagnosis not present

## 2021-07-01 DIAGNOSIS — D509 Iron deficiency anemia, unspecified: Secondary | ICD-10-CM | POA: Diagnosis not present

## 2021-07-13 ENCOUNTER — Other Ambulatory Visit: Payer: Self-pay | Admitting: Primary Care

## 2021-07-13 DIAGNOSIS — J449 Chronic obstructive pulmonary disease, unspecified: Secondary | ICD-10-CM | POA: Diagnosis not present

## 2021-07-15 DIAGNOSIS — J449 Chronic obstructive pulmonary disease, unspecified: Secondary | ICD-10-CM | POA: Diagnosis not present

## 2021-07-22 ENCOUNTER — Ambulatory Visit
Admission: RE | Admit: 2021-07-22 | Discharge: 2021-07-22 | Disposition: A | Discharge status: Home / Self Care | Payer: Medicare Other | Source: Ambulatory Visit | Attending: Acute Care | Admitting: Acute Care

## 2021-07-22 ENCOUNTER — Other Ambulatory Visit: Payer: Self-pay

## 2021-07-22 ENCOUNTER — Encounter (HOSPITAL_COMMUNITY): Payer: Self-pay | Admitting: Gastroenterology

## 2021-07-22 DIAGNOSIS — R911 Solitary pulmonary nodule: Secondary | ICD-10-CM | POA: Diagnosis not present

## 2021-07-22 DIAGNOSIS — I700 Atherosclerosis of aorta: Secondary | ICD-10-CM | POA: Diagnosis not present

## 2021-07-22 DIAGNOSIS — Z87891 Personal history of nicotine dependence: Secondary | ICD-10-CM

## 2021-07-22 DIAGNOSIS — F17210 Nicotine dependence, cigarettes, uncomplicated: Secondary | ICD-10-CM

## 2021-07-22 DIAGNOSIS — J439 Emphysema, unspecified: Secondary | ICD-10-CM | POA: Diagnosis not present

## 2021-07-22 NOTE — Progress Notes (Signed)
Attempted to obtain medical history via telephone, unable to reach at this time. I left a voicemail to return pre surgical testing department's phone call.  

## 2021-07-30 NOTE — Anesthesia Preprocedure Evaluation (Addendum)
Anesthesia Evaluation  Patient identified by MRN, date of birth, ID band Patient awake    Reviewed: Allergy & Precautions, NPO status , Patient's Chart, lab work & pertinent test results, reviewed documented beta blocker date and time   Airway Mallampati: II  TM Distance: >3 FB Neck ROM: Full    Dental  (+) Dental Advisory Given, Missing   Pulmonary COPD, former smoker,    Pulmonary exam normal breath sounds clear to auscultation       Cardiovascular hypertension, Pt. on home beta blockers and Pt. on medications + CAD, + Past MI, + Cardiac Stents and +CHF  Normal cardiovascular exam Rhythm:Regular Rate:Normal     Neuro/Psych Seizures -, Well Controlled,  PSYCHIATRIC DISORDERS Depression    GI/Hepatic Neg liver ROS, GERD  Medicated,  Endo/Other  diabetes, Type 2, Insulin Dependent, Oral Hypoglycemic Agents  Renal/GU negative Renal ROS     Musculoskeletal negative musculoskeletal ROS (+)   Abdominal   Peds  Hematology  (+) Blood dyscrasia, anemia ,   Anesthesia Other Findings   Reproductive/Obstetrics                            Anesthesia Physical Anesthesia Plan  ASA: 3  Anesthesia Plan: MAC   Post-op Pain Management:    Induction: Intravenous  PONV Risk Score and Plan: 2 and Propofol infusion and Treatment may vary due to age or medical condition  Airway Management Planned: Natural Airway  Additional Equipment:   Intra-op Plan:   Post-operative Plan:   Informed Consent: I have reviewed the patients History and Physical, chart, labs and discussed the procedure including the risks, benefits and alternatives for the proposed anesthesia with the patient or authorized representative who has indicated his/her understanding and acceptance.     Dental advisory given  Plan Discussed with: CRNA  Anesthesia Plan Comments:        Anesthesia Quick Evaluation

## 2021-07-31 ENCOUNTER — Encounter (HOSPITAL_COMMUNITY): Payer: Self-pay | Admitting: Gastroenterology

## 2021-07-31 ENCOUNTER — Encounter (HOSPITAL_COMMUNITY)
Admission: RE | Disposition: A | Discharge status: Home / Self Care | Payer: Self-pay | Source: Ambulatory Visit | Attending: Gastroenterology

## 2021-07-31 ENCOUNTER — Ambulatory Visit (HOSPITAL_COMMUNITY)
Admission: RE | Admit: 2021-07-31 | Discharge: 2021-07-31 | Disposition: A | Discharge status: Home / Self Care | Payer: Medicare Other | Source: Ambulatory Visit | Attending: Gastroenterology | Admitting: Gastroenterology

## 2021-07-31 ENCOUNTER — Ambulatory Visit (HOSPITAL_COMMUNITY): Payer: Medicare Other | Admitting: Anesthesiology

## 2021-07-31 ENCOUNTER — Other Ambulatory Visit: Payer: Self-pay

## 2021-07-31 DIAGNOSIS — K621 Rectal polyp: Secondary | ICD-10-CM | POA: Diagnosis not present

## 2021-07-31 DIAGNOSIS — I2510 Atherosclerotic heart disease of native coronary artery without angina pectoris: Secondary | ICD-10-CM | POA: Insufficient documentation

## 2021-07-31 DIAGNOSIS — E119 Type 2 diabetes mellitus without complications: Secondary | ICD-10-CM | POA: Diagnosis not present

## 2021-07-31 DIAGNOSIS — D122 Benign neoplasm of ascending colon: Secondary | ICD-10-CM | POA: Insufficient documentation

## 2021-07-31 DIAGNOSIS — D509 Iron deficiency anemia, unspecified: Secondary | ICD-10-CM | POA: Insufficient documentation

## 2021-07-31 DIAGNOSIS — Z955 Presence of coronary angioplasty implant and graft: Secondary | ICD-10-CM | POA: Insufficient documentation

## 2021-07-31 DIAGNOSIS — K5730 Diverticulosis of large intestine without perforation or abscess without bleeding: Secondary | ICD-10-CM | POA: Diagnosis not present

## 2021-07-31 DIAGNOSIS — Z881 Allergy status to other antibiotic agents status: Secondary | ICD-10-CM | POA: Diagnosis not present

## 2021-07-31 DIAGNOSIS — E785 Hyperlipidemia, unspecified: Secondary | ICD-10-CM | POA: Diagnosis not present

## 2021-07-31 DIAGNOSIS — Z794 Long term (current) use of insulin: Secondary | ICD-10-CM | POA: Diagnosis not present

## 2021-07-31 DIAGNOSIS — K449 Diaphragmatic hernia without obstruction or gangrene: Secondary | ICD-10-CM | POA: Diagnosis not present

## 2021-07-31 DIAGNOSIS — I110 Hypertensive heart disease with heart failure: Secondary | ICD-10-CM | POA: Insufficient documentation

## 2021-07-31 DIAGNOSIS — I509 Heart failure, unspecified: Secondary | ICD-10-CM | POA: Insufficient documentation

## 2021-07-31 DIAGNOSIS — Z87891 Personal history of nicotine dependence: Secondary | ICD-10-CM | POA: Diagnosis not present

## 2021-07-31 DIAGNOSIS — K635 Polyp of colon: Secondary | ICD-10-CM | POA: Diagnosis not present

## 2021-07-31 DIAGNOSIS — G40909 Epilepsy, unspecified, not intractable, without status epilepticus: Secondary | ICD-10-CM | POA: Insufficient documentation

## 2021-07-31 DIAGNOSIS — I252 Old myocardial infarction: Secondary | ICD-10-CM | POA: Insufficient documentation

## 2021-07-31 HISTORY — PX: POLYPECTOMY: SHX5525

## 2021-07-31 HISTORY — PX: ESOPHAGOGASTRODUODENOSCOPY (EGD) WITH PROPOFOL: SHX5813

## 2021-07-31 HISTORY — PX: COLONOSCOPY WITH PROPOFOL: SHX5780

## 2021-07-31 LAB — GLUCOSE, CAPILLARY: Glucose-Capillary: 84 mg/dL (ref 70–99)

## 2021-07-31 SURGERY — COLONOSCOPY WITH PROPOFOL
Anesthesia: Monitor Anesthesia Care

## 2021-07-31 SURGERY — ESOPHAGOGASTRODUODENOSCOPY (EGD) WITH PROPOFOL
Anesthesia: Monitor Anesthesia Care

## 2021-07-31 MED ORDER — SODIUM CHLORIDE 0.9 % IV SOLN: INTRAVENOUS | Status: DC

## 2021-07-31 MED ORDER — PROPOFOL 500 MG/50ML IV EMUL
INTRAVENOUS | Status: DC | PRN
  Administered 2021-07-31: 125 ug/kg/min via INTRAVENOUS

## 2021-07-31 MED ORDER — LIDOCAINE HCL (CARDIAC) PF 100 MG/5ML IV SOSY
PREFILLED_SYRINGE | INTRAVENOUS | Status: DC | PRN
  Administered 2021-07-31: 60 mg via INTRAVENOUS

## 2021-07-31 MED ORDER — PROPOFOL 1000 MG/100ML IV EMUL
INTRAVENOUS | Status: AC
  Filled 2021-07-31: qty 100

## 2021-07-31 MED ORDER — LACTATED RINGERS IV SOLN
INTRAVENOUS | Status: AC | PRN
  Administered 2021-07-31: 1000 mL via INTRAVENOUS

## 2021-07-31 MED ORDER — LACTATED RINGERS IV SOLN: INTRAVENOUS | Status: DC

## 2021-07-31 MED ORDER — PROPOFOL 10 MG/ML IV BOLUS
INTRAVENOUS | Status: DC | PRN
  Administered 2021-07-31: 20 mg via INTRAVENOUS
  Administered 2021-07-31: 30 mg via INTRAVENOUS
  Administered 2021-07-31: 10 mg via INTRAVENOUS
  Administered 2021-07-31: 20 mg via INTRAVENOUS

## 2021-07-31 MED ORDER — PROPOFOL 10 MG/ML IV BOLUS
INTRAVENOUS | Status: AC
  Filled 2021-07-31: qty 20

## 2021-07-31 SURGICAL SUPPLY — 21 items

## 2021-07-31 NOTE — H&P (Signed)
Monica [name] HPI: The patient presents with an iron deficiency, however, her HGB was normal.  She does obtain iron infusions.  A colonoscopy in 2019 was positive for adenomas.  Past Medical History:  Diagnosis Date   CAD (coronary artery disease)    Cypher stent 09/2002   Cancer ([place]) 2018   left breast, radiation therapy   CHF (congestive heart failure) ([place])    Depression    Diabetes mellitus ([place])    Hyperlipidemia    Hypertension    Myocardial infarction ([place])    2003,went into cardiac shock   Osteoporosis    Seizures ([place])    1st seizure age late 76's; most recent 07/28/17   Sinusitis     Past Surgical History:  Procedure Laterality Date   ABDOMINAL HYSTERECTOMY     APPENDECTOMY     BREAST LUMPECTOMY WITH RADIOACTIVE SEED AND SENTINEL LYMPH NODE BIOPSY Left 03/16/2017   Procedure: INJECT BLUE DYE LEFT BREAST, LEFT BREAST LUMPECTOMY WITH RADIOACTIVE SEED AND LEFT AXILLARY DEEP SENTINEL LYMPH NODE  BIOPSY ADJACENT TISSUE TRANSFER;  Surgeon: [name], MD;  Location: [facility];  Service: General;  Laterality: Left;   CARPAL TUNNEL RELEASE Bilateral    COLONOSCOPY WITH PROPOFOL N/A 05/30/2015   Procedure: COLONOSCOPY WITH PROPOFOL;  Surgeon: Carol Ada, MD;  Location: [facility];  Service: Endoscopy;  Laterality: N/A;   COLONOSCOPY WITH PROPOFOL N/A 06/09/2018   Procedure: COLONOSCOPY WITH PROPOFOL;  Surgeon: Carol Ada, MD;  Location: [facility];  Service: Endoscopy;  Laterality: N/A;   CORONARY ANGIOPLASTY WITH STENT PLACEMENT  10-02-2002   Exploratory abdominal     Bleeding in abdomen after tubal   POLYPECTOMY  06/09/2018   Procedure: POLYPECTOMY;  Surgeon: Carol Ada, MD;  Location: [facility];  Service: Endoscopy;;   TUBAL LIGATION     UMBILICAL HERNIA REPAIR      Family History  Problem Relation Age of Onset   Dementia Mother    Kidney disease Mother    Heart attack Father 66       Died age 45   Heart attack Brother 26   Heart attack Brother 64     Social History:  reports that she quit smoking about 7 months ago. Her smoking use included cigarettes. She has a 60.00 pack-year smoking history. She has never used smokeless tobacco. She reports that she does not drink alcohol and does not use drugs.  Allergies:  Allergies  Allergen Reactions   Ace Inhibitors Dermatitis and Cough   Keflex [Cephalexin] Itching   Phenobarbital Other (See Comments)    Black spots on hands and feet.   Sulfate Nausea And Vomiting and Other (See Comments)    Dizziness    Altace [Ramipril] Other (See Comments)    UNSPECIFIED REACTION      Medications: Scheduled: Continuous:  sodium chloride     lactated ringers     lactated ringers 1,000 mL (07/31/21 0722)    Results for orders placed or performed during the hospital encounter of 07/31/21 (from the past 24 hour(s))  Glucose, capillary     Status: None   Collection Time: 07/31/21  7:18 AM  Result Value Ref Range   Glucose-Capillary 84 70 - 99 mg/dL     No results found.  ROS:  As stated above in the HPI otherwise negative.  Blood pressure 102/62, pulse 67, temperature 97.7 F (36.5 C), temperature source Oral, resp. rate 20, height 5\' 5"  (1.651 m), weight 52.6 kg, SpO2 95 %.  PE: Gen: NAD, Alert and Oriented HEENT:  [place]/AT, EOMI Neck: Supple, no LAD Lungs: CTA Bilaterally CV: RRR without M/G/R ABD: Soft, NTND, +BS Ext: No C/C/E  Assessment/Plan: 1) Iron deficiency - EGD/colonoscopy.  Monica [name] D 07/31/2021, 7:30 AM

## 2021-07-31 NOTE — Anesthesia Postprocedure Evaluation (Signed)
Anesthesia Post Note  Patient: Monica [name]  Procedure(s) Performed: COLONOSCOPY WITH PROPOFOL ESOPHAGOGASTRODUODENOSCOPY (EGD) WITH PROPOFOL POLYPECTOMY     Patient location during evaluation: PACU Anesthesia Type: MAC Level of consciousness: awake and alert Pain management: pain level controlled Vital Signs Assessment: post-procedure vital signs reviewed and stable Respiratory status: spontaneous breathing, nonlabored ventilation and respiratory function stable Cardiovascular status: stable and blood pressure returned to baseline Postop Assessment: no apparent nausea or vomiting Anesthetic complications: no   No notable events documented.  Last Vitals:  Vitals:   07/31/21 0820 07/31/21 0840  BP: (!) 90/40   Pulse:    Resp:    Temp:    SpO2:  91%    Last Pain:  Vitals:   07/31/21 0840  TempSrc:   PainSc: 0-No pain                 Catalina Gravel

## 2021-07-31 NOTE — Op Note (Signed)
[facility] Patient Name: Monica [name] Procedure Date: 07/31/2021 MRN: [id number] Attending MD: Carol Ada , MD Date of Birth: 01-16-1946 CSN: [id number] Age: 75 Admit Type: Inpatient Procedure:                upper endoscopy Indications:              Iron deficiency anemia Providers:                Carol Ada, MD, Particia Nearing, RN, Frazier Richards,                            Technician Referring MD:              Medicines:                 Complications:            No immediate complications. Estimated Blood Loss:     Estimated blood loss: none. Procedure:                Pre-Anesthesia Assessment:                           - Prior to the procedure, a History and Physical                            was performed, and patient medications and                            allergies were reviewed. The patient's tolerance of                            previous anesthesia was also reviewed. The risks                            and benefits of the procedure and the sedation                            options and risks were discussed with the patient.                            All questions were answered, and informed consent                            was obtained. Prior Anticoagulants: The patient has                            taken no previous anticoagulant or antiplatelet                            agents. ASA Grade Assessment: III - A patient with                            severe systemic disease. After reviewing the risks                            and benefits, the  patient was deemed in                            satisfactory condition to undergo the procedure.                           - Sedation was administered by an anesthesia                            professional. Deep sedation was attained.                           After obtaining informed consent, the endoscope was                            passed under direct vision. Throughout the                             procedure, the patient's blood pressure, pulse, and                            oxygen saturations were monitored continuously. The                            PCF-HQ190L ([id number]) Olympus colonoscope was                            introduced through the mouth, and advanced to the                            second part of duodenum. The upper GI endoscopy was                            accomplished without difficulty. The patient                            tolerated the procedure well. Scope In: Scope Out: Findings:      A 3 cm hiatal hernia was present.      The stomach was normal.      The examined duodenum was normal. Impression:               - 3 cm hiatal hernia.                           - Normal stomach.                           - Normal examined duodenum.                           - No specimens collected. Moderate Sedation:      Not Applicable - Patient had care per Anesthesia. Recommendation:           - Proceed with the colonoscopy. Procedure Code(s):        --- Professional ---  65681, Esophagogastroduodenoscopy, flexible,                            transoral; diagnostic, including collection of                            specimen(s) by brushing or washing, when performed                            (separate procedure) Diagnosis Code(s):        --- Professional ---                           D50.9, Iron deficiency anemia, unspecified                           K44.9, Diaphragmatic hernia without obstruction or                            gangrene CPT copyright 2019 American [employer] Association. All rights reserved. The codes documented in this report are preliminary and upon coder review may  be revised to meet current compliance requirements. Carol Ada, MD Carol Ada, MD 07/31/2021 8:22:21 AM This report has been signed electronically. Number of Addenda: 0

## 2021-07-31 NOTE — Op Note (Signed)
[facility] Patient Name: Monica [name] Procedure Date: 07/31/2021 MRN: [id number] Attending MD: Carol Ada , MD Date of Birth: 1946-04-23 CSN: [id number] Age: 75 Admit Type: Inpatient Procedure:                Colonoscopy Indications:              Iron deficiency anemia Providers:                Carol Ada, MD Referring MD:              Medicines:                Propofol per Anesthesia Complications:            No immediate complications. Estimated Blood Loss:     Estimated blood loss: none. Procedure:                Pre-Anesthesia Assessment:                           - Prior to the procedure, a History and Physical                            was performed, and patient medications and                            allergies were reviewed. The patient's tolerance of                            previous anesthesia was also reviewed. The risks                            and benefits of the procedure and the sedation                            options and risks were discussed with the patient.                            All questions were answered, and informed consent                            was obtained. Prior Anticoagulants: The patient has                            taken no previous anticoagulant or antiplatelet                            agents. ASA Grade Assessment: III - A patient with                            severe systemic disease. After reviewing the risks                            and benefits, the patient was deemed in                            satisfactory  condition to undergo the procedure.                           - Sedation was administered by an anesthesia                            professional. Deep sedation was attained.                           After obtaining informed consent, the colonoscope                            was passed under direct vision. Throughout the                            procedure, the patient's blood pressure,  pulse, and                            oxygen saturations were monitored continuously. The                            PCF-HQ190L ([id number]) Olympus colonoscope was                            introduced through the anus and advanced to the the                            cecum, identified by appendiceal orifice and                            ileocecal valve. The colonoscopy was performed                            without difficulty. The patient tolerated the                            procedure well. The quality of the bowel                            preparation was good. The ileocecal valve,                            appendiceal orifice, and rectum were photographed. Scope In: 7:55:29 AM Scope Out: 8:13:08 AM Scope Withdrawal Time: 0 hours 12 minutes 56 seconds  Total Procedure Duration: 0 hours 17 minutes 39 seconds  Findings:      Three sessile polyps were found in the rectum and ascending colon. The       polyps were 2 to 3 mm in size. These polyps were removed with a cold       snare. Resection and retrieval were complete.      Scattered small-mouthed diverticula were found in the sigmoid colon. Impression:               - Three 2 to 3 mm polyps in the rectum and in the  ascending colon, removed with a cold snare.                            Resected and retrieved.                           - Diverticulosis in the sigmoid colon. Moderate Sedation:      Not Applicable - Patient had care per Anesthesia. Recommendation:           - Patient has a contact number available for                            emergencies. The signs and symptoms of potential                            delayed complications were discussed with the                            patient. Return to normal activities tomorrow.                            Written discharge instructions were provided to the                            patient.                           - Resume previous diet.                            - Continue present medications.                           - Await pathology results.                           - Repeat colonoscopy date to be determined after                            pending pathology results are reviewed for                            surveillance. Procedure Code(s):        --- Professional ---                           [phone], Colonoscopy, flexible; with removal of                            tumor(s), polyp(s), or other lesion(s) by snare                            technique Diagnosis Code(s):        --- Professional ---                           K62.1, Rectal polyp  K63.5, Polyp of colon                           D50.9, Iron deficiency anemia, unspecified                           K57.30, Diverticulosis of large intestine without                            perforation or abscess without bleeding CPT copyright 2019 American Medical Association. All rights reserved. The codes documented in this report are preliminary and upon coder review may  be revised to meet current compliance requirements. Carol Ada, MD Carol Ada, MD 07/31/2021 8:18:27 AM This report has been signed electronically. Number of Addenda: 0

## 2021-07-31 NOTE — Transfer of Care (Signed)
Immediate Anesthesia Transfer of Care Note  Patient: Monica [name]  Procedure(s) Performed: COLONOSCOPY WITH PROPOFOL ESOPHAGOGASTRODUODENOSCOPY (EGD) WITH PROPOFOL POLYPECTOMY  Patient Location: PACU  Anesthesia Type:MAC  Level of Consciousness: awake, alert  and patient cooperative  Airway & Oxygen Therapy: Patient Spontanous Breathing and Patient connected to face mask oxygen  Post-op Assessment: Report given to RN and Post -op Vital signs reviewed and stable  Post vital signs: Reviewed and stable  Last Vitals:  Vitals Value Taken Time  BP 73/33 07/31/21 0817  Temp    Pulse 65 07/31/21 0818  Resp 17 07/31/21 0818  SpO2 100 % 07/31/21 0818  Vitals shown include unvalidated device data.  Last Pain:  Vitals:   07/31/21 0705  TempSrc: Oral  PainSc: 0-No pain         Complications: No notable events documented.

## 2021-08-03 LAB — SURGICAL PATHOLOGY

## 2021-08-10 DIAGNOSIS — I10 Essential (primary) hypertension: Secondary | ICD-10-CM | POA: Diagnosis not present

## 2021-08-10 DIAGNOSIS — E7800 Pure hypercholesterolemia, unspecified: Secondary | ICD-10-CM | POA: Diagnosis not present

## 2021-08-10 DIAGNOSIS — E1165 Type 2 diabetes mellitus with hyperglycemia: Secondary | ICD-10-CM | POA: Diagnosis not present

## 2021-08-12 NOTE — Progress Notes (Signed)
HPI F Smoker( 60 pkyrs) followed for Nocturnal Hypoxemia, COPD, , complicated by Polycythemia, Aortic Atherosclerosis, CAD/MI/ stent, DM2, Seizure Disorder, Dyslipidemia, Breast Cancer L(Lumpectomy/ XRT),  Tobacco Abuse, HST 07/18/20- AHI 5.3/ hr, desaturation to 76% average 86%, body weight 118 lbs PFT 04/17/20- Minimal obstruction, no resp to BD, mild Diffusion defect  FEV1/FVC  .25 Walk Test 02/09/21- 3 Laps- lowest O2 sat 87%, improved to 94% on O2 2L POC ================================================================  02/09/21- 36 yoF former Smoker( 60 pkyrs quit March 2022) followed for Nocturnal Hypoxemia, COPD, Minimal OSA,  complicated by Polycythemia, Aortic Atherosclerosis, CAD/MI/ stent, DM2, Seizure Disorder, Dyslipidemia, Breast Cancer L(Lumpectomy/ XRT),  Tobacco Abuse, HST 07/18/20- AHI 5.3/ hr, desaturation to 76% average 86%, body weight 118 lbs O2 2l sleep and portable/ [employer]- ordered 08/15/20 for nocturnal hypoxemia -Albuterol hfa, Trelegy 100,  Body weight today-120 lbs Covid vax-3 Phizer Flu vax- had NP visiit 10/09/20- for COPD       Continues Low dose screening CT chest -----Requesting portable oxygen. Wants POC. Reports quitting smoking mid-March, 2022.  Walk Test 02/09/21- 3 Laps- lowest O2 sat 87%, improved to 94% on O2 2L POC  08/13/21- 74 yoF former Smoker( 60 pkyrs-reports quitting March, 2022) followed for Nocturnal Hypoxemia, COPD, Minimal OSA,  complicated by Polycythemia, Aortic Atherosclerosis, CAD/MI/ stent, DM2, Seizure Disorder, Dyslipidemia, Breast Cancer L(Lumpectomy/ XRT),  Tobacco Abuse, -Albuterol hfa, Trelegy 100,  O2 2L sleep/ [employer] Body weight today- Covid vax-3 Phizer Flu vax- had -----Patient feels good overall, no concerns Had iron infusion and GI work-up for anemia.  We discussed relationship of anemia to dyspnea and hypoxemia.  She has not had COVID infection.  Inhalers work well. CT chest LCS Nodule f/u 07/22/21- IMPRESSION: 1. Lung-RADS 2S,  benign appearance or behavior. Continue annual screening with low-dose chest CT without contrast in 12 months. 2. The "S" modifier above refers to potentially clinically significant non lung cancer related findings. Specifically, there is aortic atherosclerosis, in addition to 3 vessel coronary artery disease. Assessment for potential risk factor modification, dietary therapy or pharmacologic therapy may be warranted, if clinically indicated. 3. 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 Attention at time of repeat annual low dose lung cancer screening chest CT is recommended to ensure stability. 4. Mild diffuse bronchial wall thickening with mild centrilobular and paraseptal emphysema; imaging findings suggestive of underlying COPD. 5. There are calcifications of the aortic valve and mitral annulus. Echocardiographic correlation for evaluation of potential valvular dysfunction may be warranted if clinically indicated. Aortic Atherosclerosis (ICD10-I70.0) and Emphysema (ICD10-J43.9).  ROS-see HPI   + = positive Constitutional:    weight loss, night sweats, fevers, chills, fatigue, lassitude. HEENT:    headaches, difficulty swallowing, tooth/dental problems, sore throat,       sneezing, itching, ear ache, nasal congestion, post nasal drip, snoring CV:    chest pain, orthopnea, PND, swelling in lower extremities, anasarca,                                   dizziness, palpitations Resp:   +shortness of breath with exertion or at rest.                +productive cough,   non-productive cough, coughing up of blood.              change in color of mucus.  wheezing.   Skin:    rash or lesions. GI:  No-  heartburn, indigestion, abdominal pain, nausea, vomiting, diarrhea,                 change in bowel habits, loss of appetite GU: dysuria, change in color of urine, no urgency or frequency.   flank pain. MS:   joint pain, stiffness, decreased range of motion, back pain. Neuro-      nothing unusual Psych:  change in mood or affect.  depression or +anxiety.   memory loss.  OBJ- Physical Exam General- Alert, Oriented, Affect-appropriate, Distress- none acute, +slender Skin- rash-none, lesions- none, excoriation- none Lymphadenopathy- none Head- atraumatic            Eyes- Gross vision intact, PERRLA, conjunctivae and secretions clear            Ears- Hearing, canals-normal            Nose- Clear, no-Septal dev, mucus, polyps, erosion, perforation             Throat- Mallampati II-III , mucosa clear , drainage- none, tonsils- atrophic, +edentulous/ dentures Neck- flexible , trachea midline, no stridor , thyroid nl, carotid no bruit Chest - symmetrical excursion , unlabored           Heart/CV- RRR , no murmur , no gallop  , no rub, nl s1 s2                           - JVD- none , edema- none, stasis changes- none, varices- none           Lung- +few basilar crackles, wheeze- none, cough+congested, dullness-none, rub- none           Chest wall-  Abd-  Br/ Gen/ Rectal- Not done, not indicated Extrem- cyanosis- none, clubbing, none, atrophy- none, strength- nl, no clubbing Neuro- grossly intact to observation

## 2021-08-13 ENCOUNTER — Ambulatory Visit: Payer: Medicare Other | Admitting: Internal Medicine

## 2021-08-13 ENCOUNTER — Other Ambulatory Visit: Payer: Self-pay

## 2021-08-13 ENCOUNTER — Encounter: Payer: Self-pay | Admitting: Internal Medicine

## 2021-08-13 DIAGNOSIS — J449 Chronic obstructive pulmonary disease, unspecified: Secondary | ICD-10-CM | POA: Diagnosis not present

## 2021-08-13 DIAGNOSIS — J9611 Chronic respiratory failure with hypoxia: Secondary | ICD-10-CM | POA: Diagnosis not present

## 2021-08-13 NOTE — Patient Instructions (Signed)
I'm glad you are doing well.  Please call if we can help.

## 2021-08-15 DIAGNOSIS — J449 Chronic obstructive pulmonary disease, unspecified: Secondary | ICD-10-CM | POA: Diagnosis not present

## 2021-08-31 ENCOUNTER — Other Ambulatory Visit: Payer: Self-pay

## 2021-08-31 ENCOUNTER — Encounter: Payer: Self-pay | Admitting: Hematology

## 2021-08-31 ENCOUNTER — Inpatient Hospital Stay: Payer: Medicare Other | Attending: Nurse Practitioner | Admitting: Hematology

## 2021-08-31 ENCOUNTER — Inpatient Hospital Stay: Payer: Medicare Other

## 2021-08-31 VITALS — BP 120/62 | HR 65 | Temp 97.7°F | Resp 18 | Ht 65.0 in | Wt 118.4 lb

## 2021-08-31 DIAGNOSIS — Z79811 Long term (current) use of aromatase inhibitors: Secondary | ICD-10-CM | POA: Diagnosis not present

## 2021-08-31 DIAGNOSIS — D751 Secondary polycythemia: Secondary | ICD-10-CM | POA: Diagnosis not present

## 2021-08-31 DIAGNOSIS — M85852 Other specified disorders of bone density and structure, left thigh: Secondary | ICD-10-CM | POA: Insufficient documentation

## 2021-08-31 DIAGNOSIS — C50212 Malignant neoplasm of upper-inner quadrant of left female breast: Secondary | ICD-10-CM | POA: Insufficient documentation

## 2021-08-31 DIAGNOSIS — F17210 Nicotine dependence, cigarettes, uncomplicated: Secondary | ICD-10-CM | POA: Insufficient documentation

## 2021-08-31 DIAGNOSIS — D696 Thrombocytopenia, unspecified: Secondary | ICD-10-CM | POA: Diagnosis not present

## 2021-08-31 DIAGNOSIS — E611 Iron deficiency: Secondary | ICD-10-CM | POA: Insufficient documentation

## 2021-08-31 DIAGNOSIS — Z170 Estrogen receptor positive status [ER+]: Secondary | ICD-10-CM | POA: Insufficient documentation

## 2021-08-31 LAB — IRON AND TIBC
Iron: 49 ug/dL (ref 41–142)
Saturation Ratios: 14 % — ABNORMAL LOW (ref 21–57)
TIBC: 350 ug/dL (ref 236–444)
UIBC: 302 ug/dL (ref 120–384)

## 2021-08-31 LAB — CBC WITH DIFFERENTIAL/PLATELET
Abs Immature Granulocytes: 0.02 10*3/uL (ref 0.00–0.07)
Basophils Absolute: 0 10*3/uL (ref 0.0–0.1)
Basophils Relative: 0 %
Eosinophils Absolute: 0.1 10*3/uL (ref 0.0–0.5)
Eosinophils Relative: 2 %
HCT: 50.1 % — ABNORMAL HIGH (ref 36.0–46.0)
Hemoglobin: 16.3 g/dL — ABNORMAL HIGH (ref 12.0–15.0)
Immature Granulocytes: 0 %
Lymphocytes Relative: 26 %
Lymphs Abs: 1.7 10*3/uL (ref 0.7–4.0)
MCH: 25 pg — ABNORMAL LOW (ref 26.0–34.0)
MCHC: 32.5 g/dL (ref 30.0–36.0)
MCV: 77 fL — ABNORMAL LOW (ref 80.0–100.0)
Monocytes Absolute: 0.3 10*3/uL (ref 0.1–1.0)
Monocytes Relative: 5 %
Neutro Abs: 4.4 10*3/uL (ref 1.7–7.7)
Neutrophils Relative %: 67 %
Platelets: 137 10*3/uL — ABNORMAL LOW (ref 150–400)
RBC: 6.51 MIL/uL — ABNORMAL HIGH (ref 3.87–5.11)
RDW: 19.8 % — ABNORMAL HIGH (ref 11.5–15.5)
WBC: 6.5 10*3/uL (ref 4.0–10.5)
nRBC: 0 % (ref 0.0–0.2)

## 2021-08-31 LAB — COMPREHENSIVE METABOLIC PANEL
ALT: 16 U/L (ref 0–44)
AST: 16 U/L (ref 15–41)
Albumin: 4.1 g/dL (ref 3.5–5.0)
Alkaline Phosphatase: 65 U/L (ref 38–126)
Anion gap: 9 (ref 5–15)
BUN: 8 mg/dL (ref 8–23)
CO2: 26 mmol/L (ref 22–32)
Calcium: 9.4 mg/dL (ref 8.9–10.3)
Chloride: 101 mmol/L (ref 98–111)
Creatinine, Ser: 0.61 mg/dL (ref 0.44–1.00)
GFR, Estimated: >60 mL/min (ref >60)
Glucose, Bld: 92 mg/dL (ref 70–99)
Potassium: 3.7 mmol/L (ref 3.5–5.1)
Sodium: 136 mmol/L (ref 135–145)
Total Bilirubin: 0.4 mg/dL (ref 0.3–1.2)
Total Protein: 7 g/dL (ref 6.5–8.1)

## 2021-08-31 LAB — FERRITIN: Ferritin: 34 ng/mL (ref 11–307)

## 2021-08-31 NOTE — Progress Notes (Signed)
[place]   [phone] Fax:([phone]   Clinic Follow up Note   Patient Care Team: Jani Gravel, MD as PCP - General (Internal Medicine) [name], MD as Consulting Physician (General Surgery) Truitt Merle, MD as Consulting Physician (Hematology) Kyung Rudd, MD as Consulting Physician (Radiation Oncology) Gardenia Phlegm, NP as Nurse Practitioner (Hematology and Oncology)  Date of Service:  08/31/2021  CHIEF COMPLAINT: f/u of left breast cancer, polycythemia, and iron deficiency  CURRENT THERAPY:  Anastrozole 1 mg once daily starting 06/2017  ASSESSMENT & PLAN:  Monica [name] is a 75 y.o. female with   1. 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 Tolerating well.  -on surveillance, mammograms annually in [facility] at [facility], I have requested the report -physical exam was unremarkable, no clinical evidence of recurrence. Continue surveillance and anastrozole  2. Iron deficiency without anemia -Her 2021 labs showed evidence of iron deficiency, no clinical evidence of bleeding  -colonoscopy 05/2018 showed diverticulosis and had 3-4 polyps removed which can lead to bleeding.  -previously did not tolerate oral iron -ferritin 06/03/21 was 8. She subsequently received 3 doses of IV Venofer. She responded relatively well, ferritin is 34 today.   3. 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 Work up showed elevated erythropoietin which supports secondary polycythemia.  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 Overall not definitve for a MPN diagnosis.  -US abdomen 04/11/20 shows mild  splenomegaly, fatty liver and benign cystic liver lesions.  -Bone marrow biopsy from 04/29/20 showed no morphologic evidence of carcinoma or hematopoietic neoplasm.  -no signs of thrombosis. Continue smoking cessation   4. 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 Osteopenia  -Continue calcium and vitamin D -Based on her DEXA from 06/30/17 she is osteopenic with a left femur neck T-Score of -2.4  -She has started risedronate by her PCP, tolerating well -DEXA was not done in 2020, I have requested most recent scan from [place]   6. Smoking and lung nodule, Emphysema  -She has heavy smoking history since in her late 37s -she has been followed with screening chest CT's since at least 02/2018, overall no malignancy (stable nodules) and emphysema. -Sleep study 07/2020 showed sleep apnea with desat to 76%, patient reportedly started on nocturnal oxygen -Screening CT chest 03/2021 showed a new 6.6 mm lung nodule, repeat CT 07/22/21 was stable. -She continues to try to quit smoking, notes she is using lozenges at this point. -f/u with Dr Annamaria Boots     7. Comorbidities: DM, Depression and mood swing -She is on Zoloft 100 mg daily, mostly stable mood.  -Anxiety and depression fluctuate, she previously declined social work referral  -Continue med regimen and F/up PCP   PLAN: -continue anastrozole  -lab in 3 month  -lab and f/u with NP Lacie in 6 months   No problem-specific Assessment & Plan notes found for this encounter.  SUMMARY OF ONCOLOGIC HISTORY: Oncology History Overview Note  Cancer Staging Malignant neoplasm of upper-inner quadrant of left breast in female, estrogen  receptor positive ([place]) Staging form: Breast, AJCC 8th Edition - Clinical stage from 02/16/2017: Stage IA (cT1b, cN0, cM0, G2, ER: Positive, PR: Positive, HER2: Negative) - Unsigned Staging comments: Staged at breast conference  - Pathologic stage from 03/16/2017: Stage IA (pT1b, pN0, cM0, G2, ER: Positive, PR: Positive, HER2: Negative, Oncotype DX score: 27) - Signed by Truitt Merle, MD on 04/24/2017     Malignant neoplasm of upper-inner quadrant of left breast in female, estrogen receptor positive ([place])  02/09/2017 Initial Diagnosis   Malignant neoplasm of upper-inner quadrant of left breast in female, estrogen receptor positive ([place])   02/09/2017 Initial Biopsy   Diagnosis Breast, left, needle core biopsy - INVASIVE DUCTAL CARCINOMA, 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 Dalbert Batman    03/16/2017 Pathology Results   PATHOLOGY REPORT Diagnosis 03/16/17 1. Breast, lumpectomy, Left - INVASIVE DUCTAL CARCINOMA, NOTTINGHAM GRADE 2 OF 3, 0.9 CM - DUCTAL CARCINOMA IN SITU - MARGINS UNINVOLVED BY CARCINOMA (0.3 CM POSTERIOR MARGIN) - PREVIOUS BIOPSY SITE CHANGES - SEE ONCOLOGY TABLE BELOW 2. Lymph node, sentinel, biopsy, Left axillary #1 - NO CARCINOMA IDENTIFIED IN ONE LYMPH NODE (0/1) 3. Lymph node, sentinel, biopsy, Left axillary #2 - NO CARCINOMA IDENTIFIED IN ONE LYMPH NODE (0/1) 4. Lymph node, sentinel, biopsy, Left axillary #3 - NO CARCINOMA IDENTIFIED IN ONE LYMPH NODE (0/1) 5. Lymph node, sentinel, biopsy, Left axillary #4 - NO CARCINOMA IDENTIFIED IN ONE LYMPH NODE (0/1)    03/16/2017 Oncotype testing   Recurrance score of 27 with a 18% distance recurrance  in the net 10 years with Tamoxifen alone    05/19/2017 - 06/16/2017 Radiation Therapy   Radiation with Dr. Lisbeth Renshaw   06/2017 -  Anti-estrogen oral therapy   anastrozole 1 mg once a day starting late 06/2017     04/29/2020 Pathology Results   DIAGNOSIS:   BONE MARROW, ASPIRATE, CLOT, CORE:  -  Cellular marrow with trilineage hematopoiesis  -  No morphologic evidence of carcinoma or hematopoietic neoplasm  -  See microscopic description below   PERIPHERAL BLOOD:  -  Polycythemia and thrombocytopenia  -  See complete blood cell count   MICROSCOPIC DESCRIPTION:   PERIPHERAL BLOOD SMEAR: The peripheral blood has a polycythemia and  thrombocytopenia.  Leukocytes are morphologically unremarkable.   BONE MARROW ASPIRATE: Spicular, cellular and adequate for evaluation  Erythroid precursors: Orderly maturation without overt dysplasia  Granulocytic precursors: Orderly maturation without overt dysplasia  Megakaryocytes: Qualitatively and quantitatively unremarkable  Lymphocytes/plasma cells: No lymphocytosis or plasmacytosis       INTERVAL HISTORY:  ASHLYND MICHNA is here for a follow up of breast cancer, polycythemia, and iron deficiency. She was last seen by NP Lacie on 06/03/21. She presents to the clinic alone.   All other systems were reviewed with the patient and are negative.  MEDICAL HISTORY:  Past Medical History:  Diagnosis Date   CAD (coronary artery disease)    Cypher stent 09/2002   Cancer ([place]) 2018   left breast, radiation therapy   CHF (congestive heart failure) ([place])  Depression    Diabetes mellitus ([place])    Hyperlipidemia    Hypertension    Myocardial infarction ([place])    2003,went into cardiac shock   Osteoporosis    Seizures ([place])    1st seizure age late 89's; most recent 07/28/17   Sinusitis     SURGICAL HISTORY: Past Surgical History:  Procedure Laterality Date   ABDOMINAL HYSTERECTOMY     APPENDECTOMY     BREAST LUMPECTOMY WITH RADIOACTIVE SEED AND  SENTINEL LYMPH NODE BIOPSY Left 03/16/2017   Procedure: INJECT BLUE DYE LEFT BREAST, LEFT BREAST LUMPECTOMY WITH RADIOACTIVE SEED AND LEFT AXILLARY DEEP SENTINEL LYMPH NODE  BIOPSY ADJACENT TISSUE TRANSFER;  Surgeon: [name], MD;  Location: [facility];  Service: General;  Laterality: Left;   CARPAL TUNNEL RELEASE Bilateral    COLONOSCOPY WITH PROPOFOL N/A 05/30/2015   Procedure: COLONOSCOPY WITH PROPOFOL;  Surgeon: Carol Ada, MD;  Location: [facility];  Service: Endoscopy;  Laterality: N/A;   COLONOSCOPY WITH PROPOFOL N/A 06/09/2018   Procedure: COLONOSCOPY WITH PROPOFOL;  Surgeon: Carol Ada, MD;  Location: [facility];  Service: Endoscopy;  Laterality: N/A;   COLONOSCOPY WITH PROPOFOL N/A 07/31/2021   Procedure: COLONOSCOPY WITH PROPOFOL;  Surgeon: Carol Ada, MD;  Location: [facility];  Service: Endoscopy;  Laterality: N/A;   CORONARY ANGIOPLASTY WITH STENT PLACEMENT  10-02-2002   ESOPHAGOGASTRODUODENOSCOPY (EGD) WITH PROPOFOL N/A 07/31/2021   Procedure: ESOPHAGOGASTRODUODENOSCOPY (EGD) WITH PROPOFOL;  Surgeon: Carol Ada, MD;  Location: [facility];  Service: Endoscopy;  Laterality: N/A;   Exploratory abdominal     Bleeding in abdomen after tubal   POLYPECTOMY  06/09/2018   Procedure: POLYPECTOMY;  Surgeon: Carol Ada, MD;  Location: [facility];  Service: Endoscopy;;   POLYPECTOMY  07/31/2021   Procedure: POLYPECTOMY;  Surgeon: Carol Ada, MD;  Location: [facility];  Service: Endoscopy;;   TUBAL LIGATION     UMBILICAL HERNIA REPAIR      I have reviewed the social history and family history with the patient and they are unchanged from previous note.  ALLERGIES:  is allergic to ace inhibitors, keflex [cephalexin], phenobarbital, sulfate, and altace [ramipril].  MEDICATIONS:  Current Outpatient Medications  Medication Sig Dispense Refill   acetaminophen (TYLENOL) 500 MG tablet Take 1,000 mg by mouth every 6 (six) hours as needed for moderate pain or headache.      albuterol (VENTOLIN HFA) 108 (90 Base) MCG/ACT inhaler INHALE 2 PUFFS BY MOUTH EVERY 6 HOURS AS NEEDED FOR WHEEZING OR  SHORTHNESS  OF  BREATH 9 g 11   anastrozole (ARIMIDEX) 1 MG tablet Take 1 tablet (1 mg total) by mouth daily. 90 tablet 3   aspirin EC 81 MG tablet Take 81 mg by mouth at bedtime.     atorvastatin (LIPITOR) 40 MG tablet Take 40 mg by mouth at bedtime.     B-D ULTRAFINE III SHORT PEN 31G X 8 MM MISC Inject into the skin as directed.     Calcium Carbonate-Vitamin D (CALTRATE 600+D PO) Take 1 tablet by mouth in the morning and at bedtime.     carvedilol (COREG) 3.125 MG tablet Take 3.125 mg by mouth 2 (two) times daily.      clonazePAM (KLONOPIN) 1 MG tablet Take 1 mg by mouth in the morning and at bedtime.     dicyclomine (BENTYL) 20 MG tablet Take 20 mg by mouth 3 (three) times daily as needed (abdominal spasms).     Fluticasone-Umeclidin-Vilant (TRELEGY ELLIPTA) 100-62.5-25 MCG/INH AEPB Inhale 1 puff then rinse  mouth, once daily 60 each 10   halobetasol (ULTRAVATE) 0.05 % cream Apply 1 application topically 2 (two) times daily as needed (for rash).      Insulin Detemir (LEVEMIR) 100 UNIT/ML Pen Inject 17-25 Units into the skin See admin instructions. Inject 25 units subcutaneously in the morning and inject 17 units subcutaneously at night     insulin lispro (HUMALOG) 100 UNIT/ML KwikPen Inject 7 Units into the skin in the morning, at noon, and at bedtime.     levETIRAcetam (KEPPRA) 500 MG tablet Take 1 tablet (500 mg total) by mouth 2 (two) times daily. 180 tablet 3   losartan (COZAAR) 50 MG tablet Take 25 mg by mouth at bedtime.     Multiple Vitamins-Minerals (MEGAVITE FRUITS & VEGGIES PO) Take 6 tablets by mouth in the morning. Balance of Nature Fruit and Vegetable (3 Fruits & 3 Veggies)     nitroGLYCERIN (NITROSTAT) 0.4 MG SL tablet Place 1 tablet (0.4 mg total) under the tongue every 5 (five) minutes as needed for chest pain. 25 tablet 2   Omega-3 Fatty Acids (FISH OIL) 1000  MG CAPS Take 1,000 mg by mouth in the morning and at bedtime.     omeprazole (PRILOSEC) 20 MG capsule Take 20 mg by mouth in the morning.     ONETOUCH VERIO test strip USE THREE TIMES A DAY AND AS NEEDED FOR HYPOGLYCEMIA     OXYGEN Inhale 2 L into the lungs at bedtime.     pioglitazone (ACTOS) 15 MG tablet Take 15 mg by mouth at bedtime.     Polyethyl Glycol-Propyl Glycol 0.4-0.3 % SOLN Place 1 drop into both eyes 2 (two) times daily as needed (itchy/irritated eyes.).     risedronate (ACTONEL) 150 MG tablet Take 150 mg by mouth every 30 (thirty) days.     sertraline (ZOLOFT) 100 MG tablet Take 100 mg by mouth at bedtime.     TRIJARDY XR 12.5-2.02-999 MG TB24 Take 1 tablet by mouth 2 (two) times daily.     No current facility-administered medications for this visit.    PHYSICAL EXAMINATION: ECOG PERFORMANCE STATUS: 1 - Symptomatic but completely ambulatory  Vitals:   08/31/21 1151  BP: 120/62  Pulse: 65  Resp: 18  Temp: 97.7 F (36.5 C)  SpO2: 100%   Wt Readings from Last 3 Encounters:  08/31/21 118 lb 6.4 oz (53.7 kg)  08/13/21 117 lb 12.8 oz (53.4 kg)  07/31/21 116 lb (52.6 kg)     GENERAL:alert, no distress and comfortable SKIN: skin color, texture, turgor are normal, no rashes or significant lesions EYES: normal, Conjunctiva are pink and non-injected, sclera clear  NECK: supple, thyroid normal size, non-tender, without nodularity LYMPH:  no palpable lymphadenopathy in the cervical, axillary  LUNGS: clear to auscultation and percussion with normal breathing effort HEART: regular rate & rhythm and no murmurs and no lower extremity edema ABDOMEN:abdomen soft, non-tender and normal bowel sounds Musculoskeletal:no cyanosis of digits and no clubbing  NEURO: alert & oriented x 3 with fluent speech, no focal motor/sensory deficits BREAST: No palpable mass, nodules or adenopathy bilaterally. Breast exam benign.   LABORATORY DATA:  I have reviewed the data as listed CBC Latest  Ref Rng & Units 08/31/2021 06/03/2021 06/03/2021  WBC 4.0 - 10.5 K/uL 6.5 6.5 6.5  Hemoglobin 12.0 - 15.0 g/dL 16.3(H) 15.1(H) 15.2(H)  Hematocrit 36.0 - 46.0 % 50.1(H) 48.9(H) 48.3(H)  Platelets 150 - 400 K/uL 137(L) 144(L) 129(L)     CMP Latest Ref Rng &  Units 08/31/2021 06/03/2021 03/02/2021  Glucose 70 - 99 mg/dL 92 142(H) 170(H)  BUN 8 - 23 mg/dL _0 Creatinine 0.44 - 1.00 mg/dL 0.61 0.79 0.73  Sodium 135 - 145 mmol/L 136 137 137  Potassium 3.5 - 5.1 mmol/L 3.7 4.4 4.3  Chloride 98 - 111 mmol/L 101 99 101  CO2 22 - 32 mmol/L _1 Calcium 8.9 - 10.3 mg/dL 9.4 10.1 10.4(H)  Total Protein 6.5 - 8.1 g/dL 7.0 7.4 7.5  Total Bilirubin 0.3 - 1.2 mg/dL 0.4 0.5 0.5  Alkaline Phos 38 - 126 U/L 65 64 79  AST 15 - 41 U/L _2 ALT 0 - 44 U/L _3 RADIOGRAPHIC STUDIES: I have personally reviewed the radiological images as listed and agreed with the findings in the report. No results found.    Orders Placed This Encounter  Procedures   CBC with Differential/Platelet    Standing Status:   Standing    Number of Occurrences:   50    Standing Expiration Date:   08/31/2022   Comprehensive metabolic panel    Standing Status:   Standing    Number of Occurrences:   50    Standing Expiration Date:   08/31/2022   All questions were answered. The patient knows to call the clinic with any problems, questions or concerns. No barriers to learning was detected. The total time spent in the appointment was 30 minutes.     Truitt Merle, MD 08/31/2021   I, Wilburn Mylar, am acting as scribe for Truitt Merle, MD.   I have reviewed the above documentation for accuracy and completeness, and I agree with the above.

## 2021-09-02 ENCOUNTER — Telehealth: Payer: Self-pay | Admitting: Hematology

## 2021-09-02 NOTE — Telephone Encounter (Signed)
Scheduled follow-up appointments per 11/21 los. Patient is aware.

## 2021-09-09 NOTE — Progress Notes (Signed)
Please call patient and let them  know their  low dose Ct was read as a Lung RADS 2: nodules that are benign in appearance and behavior with a very low likelihood of becoming a clinically active cancer due to size or lack of growth. Recommendation per radiology is for a repeat LDCT in 12 months. .Please let them  know we will order and schedule their  annual screening scan for 07/2022. Please let them  know there was notation of CAD on their  scan.  Please remind the patient  that this is a non-gated exam therefore degree or severity of disease  cannot be determined. Please have them  follow up with their PCP regarding potential risk factor modification, dietary therapy or pharmacologic therapy if clinically indicated. Pt.  is  currently on statin therapy. Please place order for annual  screening scan for  07/2022 and fax results to PCP. Thanks so much.  Please let patient know there was notation of CAD on her scan. Recommendation per radiology of for an echo. Please have them follow up with PCP about any further diagnostics , if any , Dr. Maudie Mercury feels they need.  Additionally there was notation of a widening in an area of her aorta. Per radioology we will continue to follow this when we screen her annually. Once she is 2 she will need her PCP to order CT's to monitor, or we can refer to [facility] after her last scan for continued surveillance.  Thanks so much

## 2021-09-10 ENCOUNTER — Other Ambulatory Visit: Payer: Self-pay

## 2021-09-10 DIAGNOSIS — Z87891 Personal history of nicotine dependence: Secondary | ICD-10-CM

## 2021-09-10 DIAGNOSIS — F17210 Nicotine dependence, cigarettes, uncomplicated: Secondary | ICD-10-CM

## 2021-09-11 DIAGNOSIS — S0033XA Contusion of nose, initial encounter: Secondary | ICD-10-CM | POA: Diagnosis not present

## 2021-09-11 DIAGNOSIS — W19XXXA Unspecified fall, initial encounter: Secondary | ICD-10-CM | POA: Diagnosis not present

## 2021-09-12 DIAGNOSIS — J449 Chronic obstructive pulmonary disease, unspecified: Secondary | ICD-10-CM | POA: Diagnosis not present

## 2021-09-14 DIAGNOSIS — J449 Chronic obstructive pulmonary disease, unspecified: Secondary | ICD-10-CM | POA: Diagnosis not present

## 2021-09-22 ENCOUNTER — Encounter: Payer: Self-pay | Admitting: Internal Medicine

## 2021-09-22 DIAGNOSIS — I10 Essential (primary) hypertension: Secondary | ICD-10-CM | POA: Diagnosis not present

## 2021-09-22 DIAGNOSIS — D696 Thrombocytopenia, unspecified: Secondary | ICD-10-CM | POA: Diagnosis not present

## 2021-09-22 DIAGNOSIS — E7800 Pure hypercholesterolemia, unspecified: Secondary | ICD-10-CM | POA: Diagnosis not present

## 2021-09-22 DIAGNOSIS — E1165 Type 2 diabetes mellitus with hyperglycemia: Secondary | ICD-10-CM | POA: Diagnosis not present

## 2021-09-22 NOTE — Assessment & Plan Note (Signed)
She needs to continue oxygen 2 L for sleep

## 2021-09-22 NOTE — Assessment & Plan Note (Signed)
Meds are appropriate and she feels stable currently without recent exacerbation.  Obviously successful long-term smoking cessation is critical. Symptom overlap with anemia was discussed. Plan continue low-dose screening chest CT program

## 2021-09-24 DIAGNOSIS — D508 Other iron deficiency anemias: Secondary | ICD-10-CM | POA: Diagnosis not present

## 2021-09-24 DIAGNOSIS — D751 Secondary polycythemia: Secondary | ICD-10-CM | POA: Diagnosis not present

## 2021-09-24 DIAGNOSIS — I10 Essential (primary) hypertension: Secondary | ICD-10-CM | POA: Diagnosis not present

## 2021-09-24 DIAGNOSIS — E1121 Type 2 diabetes mellitus with diabetic nephropathy: Secondary | ICD-10-CM | POA: Diagnosis not present

## 2021-09-24 DIAGNOSIS — D696 Thrombocytopenia, unspecified: Secondary | ICD-10-CM | POA: Diagnosis not present

## 2021-09-24 DIAGNOSIS — C50912 Malignant neoplasm of unspecified site of left female breast: Secondary | ICD-10-CM | POA: Diagnosis not present

## 2021-09-24 DIAGNOSIS — E7800 Pure hypercholesterolemia, unspecified: Secondary | ICD-10-CM | POA: Diagnosis not present

## 2021-12-01 ENCOUNTER — Inpatient Hospital Stay: Payer: Medicare Other | Attending: Nurse Practitioner

## 2021-12-01 ENCOUNTER — Other Ambulatory Visit: Payer: Self-pay

## 2021-12-01 DIAGNOSIS — C50212 Malignant neoplasm of upper-inner quadrant of left female breast: Secondary | ICD-10-CM | POA: Insufficient documentation

## 2021-12-01 DIAGNOSIS — F17210 Nicotine dependence, cigarettes, uncomplicated: Secondary | ICD-10-CM | POA: Diagnosis not present

## 2021-12-01 DIAGNOSIS — Z170 Estrogen receptor positive status [ER+]: Secondary | ICD-10-CM

## 2021-12-01 DIAGNOSIS — D751 Secondary polycythemia: Secondary | ICD-10-CM | POA: Insufficient documentation

## 2021-12-01 DIAGNOSIS — E611 Iron deficiency: Secondary | ICD-10-CM | POA: Insufficient documentation

## 2021-12-01 LAB — COMPREHENSIVE METABOLIC PANEL
ALT: 15 U/L (ref 0–44)
AST: 19 U/L (ref 15–41)
Albumin: 4.5 g/dL (ref 3.5–5.0)
Alkaline Phosphatase: 55 U/L (ref 38–126)
Anion gap: 6 (ref 5–15)
BUN: 8 mg/dL (ref 8–23)
CO2: 30 mmol/L (ref 22–32)
Calcium: 9.9 mg/dL (ref 8.9–10.3)
Chloride: 102 mmol/L (ref 98–111)
Creatinine, Ser: 0.58 mg/dL (ref 0.44–1.00)
GFR, Estimated: >60 mL/min (ref >60)
Glucose, Bld: 97 mg/dL (ref 70–99)
Potassium: 4.7 mmol/L (ref 3.5–5.1)
Sodium: 138 mmol/L (ref 135–145)
Total Bilirubin: 0.6 mg/dL (ref 0.3–1.2)
Total Protein: 7.4 g/dL (ref 6.5–8.1)

## 2021-12-01 LAB — CBC WITH DIFFERENTIAL/PLATELET
Abs Immature Granulocytes: 0.02 10*3/uL (ref 0.00–0.07)
Basophils Absolute: 0 10*3/uL (ref 0.0–0.1)
Basophils Relative: 1 %
Eosinophils Absolute: 0.1 10*3/uL (ref 0.0–0.5)
Eosinophils Relative: 2 %
HCT: 51.1 % — ABNORMAL HIGH (ref 36.0–46.0)
Hemoglobin: 16.7 g/dL — ABNORMAL HIGH (ref 12.0–15.0)
Immature Granulocytes: 0 %
Lymphocytes Relative: 25 %
Lymphs Abs: 1.6 10*3/uL (ref 0.7–4.0)
MCH: 25.6 pg — ABNORMAL LOW (ref 26.0–34.0)
MCHC: 32.7 g/dL (ref 30.0–36.0)
MCV: 78.3 fL — ABNORMAL LOW (ref 80.0–100.0)
Monocytes Absolute: 0.3 10*3/uL (ref 0.1–1.0)
Monocytes Relative: 5 %
Neutro Abs: 4.2 10*3/uL (ref 1.7–7.7)
Neutrophils Relative %: 67 %
Platelets: 135 10*3/uL — ABNORMAL LOW (ref 150–400)
RBC: 6.53 MIL/uL — ABNORMAL HIGH (ref 3.87–5.11)
RDW: 17.1 % — ABNORMAL HIGH (ref 11.5–15.5)
WBC: 6.3 10*3/uL (ref 4.0–10.5)
nRBC: 0 % (ref 0.0–0.2)

## 2021-12-01 LAB — FERRITIN: Ferritin: 20 ng/mL (ref 11–307)

## 2021-12-05 ENCOUNTER — Encounter: Payer: Self-pay | Admitting: Hematology

## 2021-12-11 DIAGNOSIS — J449 Chronic obstructive pulmonary disease, unspecified: Secondary | ICD-10-CM | POA: Diagnosis not present

## 2021-12-13 DIAGNOSIS — J449 Chronic obstructive pulmonary disease, unspecified: Secondary | ICD-10-CM | POA: Diagnosis not present

## 2021-12-21 DIAGNOSIS — E1165 Type 2 diabetes mellitus with hyperglycemia: Secondary | ICD-10-CM | POA: Diagnosis not present

## 2021-12-21 DIAGNOSIS — E7800 Pure hypercholesterolemia, unspecified: Secondary | ICD-10-CM | POA: Diagnosis not present

## 2021-12-21 DIAGNOSIS — D696 Thrombocytopenia, unspecified: Secondary | ICD-10-CM | POA: Diagnosis not present

## 2021-12-28 DIAGNOSIS — D751 Secondary polycythemia: Secondary | ICD-10-CM | POA: Diagnosis not present

## 2021-12-28 DIAGNOSIS — E1165 Type 2 diabetes mellitus with hyperglycemia: Secondary | ICD-10-CM | POA: Diagnosis not present

## 2021-12-28 DIAGNOSIS — K219 Gastro-esophageal reflux disease without esophagitis: Secondary | ICD-10-CM | POA: Diagnosis not present

## 2021-12-28 DIAGNOSIS — Z794 Long term (current) use of insulin: Secondary | ICD-10-CM | POA: Diagnosis not present

## 2021-12-28 DIAGNOSIS — I10 Essential (primary) hypertension: Secondary | ICD-10-CM | POA: Diagnosis not present

## 2021-12-28 DIAGNOSIS — M810 Age-related osteoporosis without current pathological fracture: Secondary | ICD-10-CM | POA: Diagnosis not present

## 2021-12-28 DIAGNOSIS — Z0000 Encounter for general adult medical examination without abnormal findings: Secondary | ICD-10-CM | POA: Diagnosis not present

## 2021-12-28 DIAGNOSIS — Z853 Personal history of malignant neoplasm of breast: Secondary | ICD-10-CM | POA: Diagnosis not present

## 2021-12-28 DIAGNOSIS — R918 Other nonspecific abnormal finding of lung field: Secondary | ICD-10-CM | POA: Diagnosis not present

## 2021-12-28 DIAGNOSIS — E7800 Pure hypercholesterolemia, unspecified: Secondary | ICD-10-CM | POA: Diagnosis not present

## 2021-12-28 DIAGNOSIS — R569 Unspecified convulsions: Secondary | ICD-10-CM | POA: Diagnosis not present

## 2022-01-11 DIAGNOSIS — J449 Chronic obstructive pulmonary disease, unspecified: Secondary | ICD-10-CM | POA: Diagnosis not present

## 2022-01-13 DIAGNOSIS — J449 Chronic obstructive pulmonary disease, unspecified: Secondary | ICD-10-CM | POA: Diagnosis not present

## 2022-02-07 DIAGNOSIS — E7800 Pure hypercholesterolemia, unspecified: Secondary | ICD-10-CM | POA: Diagnosis not present

## 2022-02-07 DIAGNOSIS — I10 Essential (primary) hypertension: Secondary | ICD-10-CM | POA: Diagnosis not present

## 2022-02-07 DIAGNOSIS — E1165 Type 2 diabetes mellitus with hyperglycemia: Secondary | ICD-10-CM | POA: Diagnosis not present

## 2022-02-10 DIAGNOSIS — J449 Chronic obstructive pulmonary disease, unspecified: Secondary | ICD-10-CM | POA: Diagnosis not present

## 2022-02-10 NOTE — Patient Instructions (Signed)
Below is our plan: ? ?We will continue levetiracetam 500mg  twice daily ? ?Please make sure you are consistent with timing of seizure medication. I recommend annual visit with primary care provider (PCP) for complete physical and routine blood work. We will monitor vitamin D level. I recommend daily intake of vitamin D (400-800iu) and calcium (800-1000mg ) for bone health. Discuss Dexa screening with PCP.  ? ?According to [place] law, you can not drive unless you are seizure / syncope free for at least 6 months and under physician's care. ? ?Please maintain precautions. Do not participate in activities where a loss of awareness could harm you or someone else. No swimming alone, no tub bathing, no hot tubs, no driving, no operating motorized vehicles (cars, ATVs, motocycles, etc), lawnmowers, power tools or firearms. No standing at heights, such as rooftops, ladders or stairs. Avoid hot objects such as stoves, heaters, open fires. Wear a helmet when riding a bicycle, scooter, skateboard, etc. and avoid areas of traffic. Set your water heater to 120 degrees or less.  ? ?Please make sure you are staying well hydrated. I recommend 50-60 ounces daily. Well balanced diet and regular exercise encouraged. Consistent sleep schedule with 6-8 hours recommended.  ? ?Please continue follow up with care team as directed.  ? ?Follow up with me in 1 year  ? ?You may receive a survey regarding today's visit. I encourage you to leave honest feed back as I do use this information to improve patient care. Thank you for seeing me today!  ? ? ?

## 2022-02-10 NOTE — Progress Notes (Signed)
? ? ?PATIENT: Monica [name] ?DOB: [birth year] ? ?REASON FOR VISIT: follow up ?HISTORY FROM: patient ? ?Chief Complaint  ?Patient presents with  ? Follow-up  ?  Rm 6, alone. Here for yearly SZ f/u. No sz like activity since last OV.   ?  ? ?HISTORY OF PRESENT ILLNESS: ? ?02/11/22 ALL:  ?Monica [name] returns for follow up. She continues levetiracetam 500mg  BID. She continues to do well. No seizures. She is followed regularly by PCP and oncology. Breast exam and follow up scheduled in May. She continues to work on smoking cessation. She is smoking about 6 cigarettes a day.  ? ?02/11/2021 ALL:  ?She returns for follow up for seizures. She continues levetiracetam 500mg  BID. She continues to tolerate medications with no adverse effects. No seizure activity. She is followed by PCP, oncology and pulmonology regularly. Labs have been stable. She is trying to quit smoking.  ? ?10/29/2019 ALL:  ?Monica [name] is a 76 y.o. female here today for follow up for seizure. She continues levetiracetam 500mg  twice daily. Last seizure 07/2017. She denies missed doses of medication or obvious adverse effects.  She is able to perform ADLs independently.  She lives at home with her husband.  She reports that she is eating and sleeping well.  She does drive but only during the daytime and to local places.  She is doing well today and without concerns. ? ?HISTORY: (copied from [employer] note on 10/26/2018) ? ?UPDATE 1/16/2020CM Monica [name], 76 year old female returns for follow-up with seizure disorder with last seizure occurring in October 2018.  She is currently on Keppra 500 mg twice daily.  She denies any side effects to the medication.  She denies any interval medical issues.  She returns for reevaluation and refills ?  ?UPDATE 7/2/2019CM Monica [name], 76 year old female returns for follow-up for seizure disorder.  Last seizure occurred 07/28/2017.  She is currently taking levetiracetam 500 mg twice daily without side effects.  She  returns for reevaluation ?  ?UPDATE (10/05/17, [facility]): Since last visit, doing well. Tolerating LEV 500mg  twice a day. No alleviating or aggravating factors.  ?  ?PRIOR HPI (08/02/17): 76 year old female here for evaluation of seizures.  History of diabetes, heart disease, breast cancer.  At age late 76 years old patient had a seizure, lips turn blue, no tongue biting or incontinence.  She was apparently treated with antiseizure medication for several years and then discontinued.  May 24, 2017 patient was at [facility], when all of a sudden she collapsed and had a seizure.  Blood glucose was checked within 15 minutes and was 52.  No warning symptoms.  She ate breakfast that morning but no lunch.  She was taken to the hospital and evaluated. July 28, 2017 patient was at home, when all of a sudden she collapsed.  She was found stiff all over, with some convulsions.  Family gave her some juice to drink and blood sugar after testing was 120.  She was confused afterwards.  No tongue biting or incontinence. Patient has diagnosis of breast cancer.  Her first radiation treatment was May 11, 2017. ? ? ?REVIEW OF SYSTEMS: Out of a complete 14 system review of symptoms, the patient complains only of the following symptoms, none and all other reviewed systems are negative. ? ?ALLERGIES: ?Allergies  ?Allergen Reactions  ? Ace Inhibitors Dermatitis and Cough  ? Keflex [Cephalexin] Itching  ? Phenobarbital Other (See Comments)  ?  Black spots on hands and feet.  ? Sulfate  Nausea And Vomiting and Other (See Comments)  ?  Dizziness ?  ? Altace [Ramipril] Other (See Comments)  ?  UNSPECIFIED REACTION    ? ? ?HOME MEDICATIONS: ?Outpatient Medications Prior to Visit  ?Medication Sig Dispense Refill  ? acetaminophen (TYLENOL) 500 MG tablet Take 1,000 mg by mouth every 6 (six) hours as needed for moderate pain or headache.    ? albuterol (VENTOLIN HFA) 108 (90 Base) MCG/ACT inhaler INHALE 2 PUFFS BY MOUTH EVERY 6 HOURS AS NEEDED  FOR WHEEZING OR  SHORTHNESS  OF  BREATH 9 g 11  ? anastrozole (ARIMIDEX) 1 MG tablet Take 1 tablet (1 mg total) by mouth daily. 90 tablet 3  ? aspirin EC 81 MG tablet Take 81 mg by mouth at bedtime.    ? atorvastatin (LIPITOR) 40 MG tablet Take 40 mg by mouth at bedtime.    ? B-D ULTRAFINE III SHORT PEN 31G X 8 MM MISC Inject into the skin as directed.    ? Calcium Carbonate-Vitamin D (CALTRATE 600+D PO) Take 1 tablet by mouth in the morning and at bedtime.    ? carvedilol (COREG) 3.125 MG tablet Take 3.125 mg by mouth 2 (two) times daily.     ? clonazePAM (KLONOPIN) 1 MG tablet Take 1 mg by mouth in the morning and at bedtime.    ? dicyclomine (BENTYL) 20 MG tablet Take 20 mg by mouth 3 (three) times daily as needed (abdominal spasms).    ? Fluticasone-Umeclidin-Vilant (TRELEGY ELLIPTA) 100-62.5-25 MCG/INH AEPB Inhale 1 puff then rinse mouth, once daily 60 each 10  ? halobetasol (ULTRAVATE) 0.05 % cream Apply 1 application topically 2 (two) times daily as needed (for rash).     ? Insulin Detemir (LEVEMIR) 100 UNIT/ML Pen Inject 17-25 Units into the skin See admin instructions. Inject 25 units subcutaneously in the morning and inject 17 units subcutaneously at night    ? insulin lispro (HUMALOG) 100 UNIT/ML KwikPen Inject 7 Units into the skin in the morning, at noon, and at bedtime.    ? losartan (COZAAR) 50 MG tablet Take 25 mg by mouth at bedtime.    ? Multiple Vitamins-Minerals (MEGAVITE FRUITS & VEGGIES PO) Take 6 tablets by mouth in the morning. Balance of Nature Fruit and Vegetable (3 Fruits & 3 Veggies)    ? nitroGLYCERIN (NITROSTAT) 0.4 MG SL tablet Place 1 tablet (0.4 mg total) under the tongue every 5 (five) minutes as needed for chest pain. 25 tablet 2  ? Omega-3 Fatty Acids (FISH OIL) 1000 MG CAPS Take 1,000 mg by mouth in the morning and at bedtime.    ? omeprazole (PRILOSEC) 20 MG capsule Take 20 mg by mouth in the morning.    ? ONETOUCH VERIO test strip USE THREE TIMES A DAY AND AS NEEDED FOR  HYPOGLYCEMIA    ? OXYGEN Inhale 2 L into the lungs at bedtime.    ? pioglitazone (ACTOS) 15 MG tablet Take 15 mg by mouth at bedtime.    ? Polyethyl Glycol-Propyl Glycol 0.4-0.3 % SOLN Place 1 drop into both eyes 2 (two) times daily as needed (itchy/irritated eyes.).    ? risedronate (ACTONEL) 150 MG tablet Take 150 mg by mouth every 30 (thirty) days.    ? sertraline (ZOLOFT) 100 MG tablet Take 100 mg by mouth at bedtime.    ? TRIJARDY XR 12.5-2.02-999 MG TB24 Take 1 tablet by mouth 2 (two) times daily.    ? levETIRAcetam (KEPPRA) 500 MG tablet Take 1 tablet (500  mg total) by mouth 2 (two) times daily. 180 tablet 3  ? ?No facility-administered medications prior to visit.  ? ? ?PAST MEDICAL HISTORY: ?Past Medical History:  ?Diagnosis Date  ? CAD (coronary artery disease)   ? Cypher stent 09/2002  ? Cancer [facility]) 2018  ? left breast, radiation therapy  ? CHF (congestive heart failure) ([place])   ? Depression   ? Diabetes mellitus ([place])   ? Hyperlipidemia   ? Hypertension   ? Myocardial infarction [facility])   ? 2003,went into cardiac shock  ? Osteoporosis   ? Seizures ([place])   ? 1st seizure age late 56's; most recent 07/28/17  ? Sinusitis   ? ? ?PAST SURGICAL HISTORY: ?Past Surgical History:  ?Procedure Laterality Date  ? ABDOMINAL HYSTERECTOMY    ? APPENDECTOMY    ? BREAST LUMPECTOMY WITH RADIOACTIVE SEED AND SENTINEL LYMPH NODE BIOPSY Left 03/16/2017  ? Procedure: INJECT BLUE DYE LEFT BREAST, LEFT BREAST LUMPECTOMY WITH RADIOACTIVE SEED AND LEFT AXILLARY DEEP SENTINEL LYMPH NODE  BIOPSY ADJACENT TISSUE TRANSFER;  Surgeon: [name], MD;  Location: [place];  Service: General;  Laterality: Left;  ? CARPAL TUNNEL RELEASE Bilateral   ? COLONOSCOPY WITH PROPOFOL N/A 05/30/2015  ? Procedure: COLONOSCOPY WITH PROPOFOL;  Surgeon: Carol Ada, MD;  Location: [facility];  Service: Endoscopy;  Laterality: N/A;  ? COLONOSCOPY WITH PROPOFOL N/A 06/09/2018  ? Procedure: COLONOSCOPY WITH PROPOFOL;  Surgeon: Carol Ada, MD;  Location:  [facility];  Service: Endoscopy;  Laterality: N/A;  ? COLONOSCOPY WITH PROPOFOL N/A 07/31/2021  ? Procedure: COLONOSCOPY WITH PROPOFOL;  Surgeon: Carol Ada, MD;  Location: [facility];  Service: Endoscopy;

## 2022-02-11 ENCOUNTER — Encounter: Payer: Self-pay | Admitting: Family Medicine

## 2022-02-11 ENCOUNTER — Ambulatory Visit: Payer: Medicare Other | Admitting: Family Medicine

## 2022-02-11 VITALS — BP 105/60 | HR 62 | Ht 65.0 in | Wt 113.6 lb

## 2022-02-11 DIAGNOSIS — G40909 Epilepsy, unspecified, not intractable, without status epilepticus: Secondary | ICD-10-CM

## 2022-02-11 MED ORDER — LEVETIRACETAM 500 MG PO TABS: 500.0000 mg | ORAL_TABLET | Freq: Two times a day (BID) | ORAL | 3 refills | Status: DC

## 2022-02-12 DIAGNOSIS — J449 Chronic obstructive pulmonary disease, unspecified: Secondary | ICD-10-CM | POA: Diagnosis not present

## 2022-03-02 NOTE — Progress Notes (Unsigned)
[place]   [phone] [phone]   Clinic Follow up Note   Patient Care Team: Monica Seashore, MD as PCP - General (Internal Medicine) Monica Skates, MD as Consulting Physician (General Surgery) Monica Merle, MD as Consulting Physician (Hematology) Monica Rudd, MD as Consulting Physician (Radiation Oncology) Monica Phlegm, NP as Nurse Practitioner (Hematology and Oncology) 03/03/2022  CHIEF COMPLAINT: Follow-up left breast cancer, polycythemia, and iron deficiency  SUMMARY OF ONCOLOGIC HISTORY: Oncology History Overview Note  Cancer Staging Malignant neoplasm of upper-inner quadrant of left breast in female, estrogen receptor positive ([place]) Staging form: Breast, AJCC 8th Edition - Clinical stage from 02/16/2017: Stage IA (cT1b, cN0, cM0, G2, ER: Positive, PR: Positive, HER2: Negative) - Unsigned Staging comments: Staged at breast conference  - Pathologic stage from 03/16/2017: Stage IA (pT1b, pN0, cM0, G2, ER: Positive, PR: Positive, HER2: Negative, Oncotype DX score: 27) - Signed by Monica Merle, MD on 04/24/2017     Malignant neoplasm of upper-inner quadrant of left breast in female, estrogen receptor positive ([place])  02/09/2017 Initial Diagnosis   Malignant neoplasm of upper-inner quadrant of left breast in female, estrogen receptor positive ([place])    02/09/2017 Initial Biopsy   Diagnosis Breast, left, needle core biopsy - INVASIVE DUCTAL CARCINOMA, 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 Dalbert [name]     03/16/2017 Pathology Results   PATHOLOGY REPORT Diagnosis 03/16/17 1. Breast, lumpectomy, Left -  INVASIVE DUCTAL CARCINOMA, NOTTINGHAM GRADE 2 OF 3, 0.9 CM - DUCTAL CARCINOMA IN SITU - MARGINS UNINVOLVED BY CARCINOMA (0.3 CM POSTERIOR MARGIN) - PREVIOUS BIOPSY SITE CHANGES - SEE ONCOLOGY TABLE BELOW 2. Lymph node, sentinel, biopsy, Left axillary #1 - NO CARCINOMA IDENTIFIED IN ONE LYMPH NODE (0/1) 3. Lymph node, sentinel, biopsy, Left axillary #2 - NO CARCINOMA IDENTIFIED IN ONE LYMPH NODE (0/1) 4. Lymph node, sentinel, biopsy, Left axillary #3 - NO CARCINOMA IDENTIFIED IN ONE LYMPH NODE (0/1) 5. Lymph node, sentinel, biopsy, Left axillary #4 - NO CARCINOMA IDENTIFIED IN ONE LYMPH NODE (0/1)     03/16/2017 Oncotype testing   Recurrance score of 27 with a 18% distance recurrance in the net 10 years with Tamoxifen alone     05/19/2017 - 06/16/2017 Radiation Therapy   Radiation with Dr. Lisbeth [name]    06/2017 -  Anti-estrogen oral therapy   anastrozole 1 mg once a day starting late 06/2017     04/29/2020 Pathology Results   DIAGNOSIS:   BONE MARROW, ASPIRATE, CLOT, CORE:  -  Cellular marrow with trilineage hematopoiesis  -  No morphologic evidence of carcinoma or hematopoietic neoplasm  -  See microscopic description below   PERIPHERAL BLOOD:  -  Polycythemia and thrombocytopenia  -  See complete blood cell count   MICROSCOPIC DESCRIPTION:   PERIPHERAL BLOOD SMEAR: The peripheral blood has a polycythemia and  thrombocytopenia.  Leukocytes are morphologically unremarkable.   BONE MARROW ASPIRATE: Spicular, cellular and adequate for evaluation  Erythroid precursors: Orderly maturation without overt dysplasia  Granulocytic precursors: Orderly maturation without overt dysplasia  Megakaryocytes: Qualitatively and quantitatively unremarkable  Lymphocytes/plasma cells: No lymphocytosis or plasmacytosis      CURRENT THERAPY: Anastrozole 1 mg once daily starting 06/2017  INTERVAL HISTORY: Ms. Monica [name] returns for follow-up as scheduled, last seen by Dr. Burr [name] 08/31/2021.  She denies  changes in her overall health.  Feeling well in general with normal energy and appetite.  Continues anastrozole, she has occasional right knee and hip pain and hands feel stiff in the morning.  Denies hot flashes.  She does breast exams and everything feels normal, denies new lump/mass, nipple discharge or inversion, or skin change.  Denies new or worsening cough, chest pain, dyspnea, leg edema, headaches.  She turned her head into a door frame and got a black eye.  She also notes 2-3 months ago she got tangled up in a vine and fell in her driveway.  All other systems were reviewed with the patient and are negative.  MEDICAL HISTORY:  Past Medical History:  Diagnosis Date   CAD (coronary artery disease)    Cypher stent 09/2002   Cancer ([place]) 2018   left breast, radiation therapy   CHF (congestive heart failure) ([place])    Depression    Diabetes mellitus ([place])    Hyperlipidemia    Hypertension    Myocardial infarction ([place])    2003,went into cardiac shock   Osteoporosis    Seizures ([place])    1st seizure age late 47's; most recent 07/28/17   Sinusitis     SURGICAL HISTORY: Past Surgical History:  Procedure Laterality Date   ABDOMINAL HYSTERECTOMY     APPENDECTOMY     BREAST LUMPECTOMY WITH RADIOACTIVE SEED AND SENTINEL LYMPH NODE BIOPSY Left 03/16/2017   Procedure: INJECT BLUE DYE LEFT BREAST, LEFT BREAST LUMPECTOMY WITH RADIOACTIVE SEED AND LEFT AXILLARY DEEP SENTINEL LYMPH NODE  BIOPSY ADJACENT TISSUE TRANSFER;  Surgeon: Monica Skates, MD;  Location: [facility];  Service: General;  Laterality: Left;   CARPAL TUNNEL RELEASE Bilateral    COLONOSCOPY WITH PROPOFOL N/A 05/30/2015   Procedure: COLONOSCOPY WITH PROPOFOL;  Surgeon: Monica Ada, MD;  Location: [facility];  Service: Endoscopy;  Laterality: N/A;   COLONOSCOPY WITH PROPOFOL N/A 06/09/2018   Procedure: COLONOSCOPY WITH PROPOFOL;  Surgeon: Monica Ada, MD;  Location: [facility];  Service: Endoscopy;  Laterality: N/A;    COLONOSCOPY WITH PROPOFOL N/A 07/31/2021   Procedure: COLONOSCOPY WITH PROPOFOL;  Surgeon: Monica Ada, MD;  Location: [facility];  Service: Endoscopy;  Laterality: N/A;   CORONARY ANGIOPLASTY WITH STENT PLACEMENT  10-02-2002   ESOPHAGOGASTRODUODENOSCOPY (EGD) WITH PROPOFOL N/A 07/31/2021   Procedure: ESOPHAGOGASTRODUODENOSCOPY (EGD) WITH PROPOFOL;  Surgeon: Monica Ada, MD;  Location: [facility];  Service: Endoscopy;  Laterality: N/A;   Exploratory abdominal     Bleeding in abdomen after tubal   POLYPECTOMY  06/09/2018   Procedure: POLYPECTOMY;  Surgeon: Monica Ada, MD;  Location: [facility];  Service: Endoscopy;;   POLYPECTOMY  07/31/2021   Procedure: POLYPECTOMY;  Surgeon: Monica Ada, MD;  Location: [facility];  Service: Endoscopy;;   TUBAL LIGATION     UMBILICAL HERNIA REPAIR      I have reviewed the social history and family history with the patient and they are unchanged from previous note.  ALLERGIES:  is allergic to ace inhibitors, keflex [cephalexin], phenobarbital, sulfate, and altace [ramipril].  MEDICATIONS:  Current Outpatient Medications  Medication Sig Dispense Refill   acetaminophen (TYLENOL) 500 MG tablet Take 1,000 mg by mouth every 6 (six) hours as needed for moderate pain or headache.     albuterol (VENTOLIN HFA) 108 (90 Base) MCG/ACT inhaler INHALE 2 PUFFS BY MOUTH EVERY 6 HOURS AS NEEDED FOR WHEEZING OR  SHORTHNESS  OF  BREATH 9 g 11   anastrozole (ARIMIDEX) 1 MG tablet Take 1 tablet (1 mg total) by mouth daily. 90 tablet 3   aspirin EC 81 MG tablet Take 81 mg by mouth at bedtime.     atorvastatin (LIPITOR) 40 MG tablet Take 40 mg by mouth at bedtime.     B-D ULTRAFINE III SHORT PEN 31G X 8 MM MISC Inject into the skin as directed.     Calcium Carbonate-Vitamin D (CALTRATE 600+D PO) Take 1 tablet by mouth in the morning and at bedtime.     carvedilol (COREG) 3.125 MG tablet Take 3.125 mg by mouth 2 (two) times daily.      clonazePAM (KLONOPIN) 1 MG  tablet Take 1 mg by mouth in the morning and at bedtime.     dicyclomine (BENTYL) 20 MG tablet Take 20 mg by mouth 3 (three) times daily as needed (abdominal spasms).     Fluticasone-Umeclidin-Vilant (TRELEGY ELLIPTA) 100-62.5-25 MCG/INH AEPB Inhale 1 puff then rinse mouth, once daily 60 each 10   halobetasol (ULTRAVATE) 0.05 % cream Apply 1 application topically 2 (two) times daily as needed (for rash).      Insulin Detemir (LEVEMIR) 100 UNIT/ML Pen Inject 17-25 Units into the skin See admin instructions. Inject 25 units subcutaneously in the morning and inject 17 units subcutaneously at night     insulin lispro (HUMALOG) 100 UNIT/ML KwikPen Inject 7 Units into the skin in the morning, at noon, and at bedtime.     levETIRAcetam (KEPPRA) 500 MG tablet Take 1 tablet (500 mg total) by mouth 2 (two) times daily. 180 tablet 3   losartan (COZAAR) 50 MG tablet Take 25 mg by mouth at bedtime.     Multiple Vitamins-Minerals (MEGAVITE FRUITS & VEGGIES PO) Take 6 tablets by mouth in the morning. Balance of Nature Fruit and Vegetable (3 Fruits & 3 Veggies)     nitroGLYCERIN (NITROSTAT) 0.4 MG SL tablet Place 1 tablet (0.4 mg total) under the tongue every 5 (five) minutes as needed for chest pain. 25 tablet 2   Omega-3 Fatty Acids (FISH OIL) 1000 MG CAPS Take 1,000 mg by mouth in the morning and at bedtime.     omeprazole (PRILOSEC) 20 MG capsule Take 20 mg by mouth in the morning.     ONETOUCH VERIO test strip USE THREE TIMES A DAY AND AS NEEDED FOR HYPOGLYCEMIA     OXYGEN Inhale 2 L into the lungs at bedtime.     pioglitazone (ACTOS) 15 MG tablet Take 15 mg by mouth at bedtime.     Polyethyl Glycol-Propyl Glycol 0.4-0.3 % SOLN Place 1 drop into both eyes 2 (two) times daily as needed (itchy/irritated eyes.).     risedronate (ACTONEL) 150 MG tablet Take 150 mg by mouth every 30 (thirty) days.     sertraline (ZOLOFT) 100 MG tablet Take 100 mg by mouth at bedtime.     TRIJARDY XR 12.5-2.02-999 MG TB24 Take 1  tablet by mouth 2 (two) times daily.     No current facility-administered medications for this visit.    PHYSICAL EXAMINATION: ECOG PERFORMANCE STATUS: 1 - Symptomatic but completely ambulatory  Vitals:   03/03/22 1228  BP: 108/63  Pulse: 60  Resp: 18  Temp: 97.9 F (36.6 C)  SpO2: 94%   Filed Weights   03/03/22 1228  Weight: 115 lb 1.6 oz (52.2 kg)    GENERAL:alert, no distress and comfortable SKIN: No rash  EYES:  sclera clear.  Ecchymosis to left eyelid LYMPH:  no palpable cervical or supraclavicular lymphadenopathy  LUNGS: with normal breathing effort HEART: no lower extremity edema ABDOMEN:abdomen soft, non-tender Musculoskeletal: No focal spinal tenderness NEURO: alert & oriented x 3 with fluent speech, no focal motor deficits Breast exam: Breasts are symmetrical without nipple discharge or inversion.  S/p left lumpectomy, incisions completely healed.  Dense tissue in the lower breasts.  No palpable mass in either breast or axilla that I could appreciate.  LABORATORY DATA:  I have reviewed the data as listed    Latest Ref Rng & Units 03/03/2022   12:11 PM 12/01/2021   11:10 AM 08/31/2021   11:12 AM  CBC  WBC 4.0 - 10.5 K/uL 6.1   6.3   6.5    Hemoglobin 12.0 - 15.0 g/dL 16.4   16.7   16.3    Hematocrit 36.0 - 46.0 % 50.8   51.1   50.1    Platelets 150 - 400 K/uL 145   135   137          Latest Ref Rng & Units 03/03/2022   12:11 PM 12/01/2021   11:10 AM 08/31/2021   11:12 AM  CMP  Glucose 70 - 99 mg/dL 156   97   92    BUN 8 - 23 mg/dL $Remove'9   8   8    'yNZQGTg$ Creatinine 0.44 - 1.00 mg/dL 0.61   0.58   0.61    Sodium 135 - 145 mmol/L 139   138   136    Potassium 3.5 - 5.1 mmol/L 4.3   4.7   3.7    Chloride 98 - 111 mmol/L 104   102   101    CO2 22 - 32 mmol/L $RemoveB'28   30   26    'bQzvUVxC$ Calcium 8.9 - 10.3 mg/dL 9.6   9.9   9.4    Total Protein 6.5 - 8.1 g/dL 7.5   7.4   7.0    Total Bilirubin 0.3 - 1.2 mg/dL 0.4   0.6   0.4    Alkaline Phos 38 - 126 U/L 73   55   65    AST  15 - 41 U/L $Remo'16   19   16    'jXvxl$ ALT 0 - 44 U/L $Remo'13   15   16        'JWkAP$ RADIOGRAPHIC STUDIES: I have personally reviewed the radiological images as listed and agreed with the findings in the report. No results found.   ASSESSMENT & PLAN: Monica [name] is a 76 y.o. female with      1. 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 Tolerating well.  -on surveillance, mammograms annually at [facility], next due 03/11/2022.  I have asked the patient to sign a release form so we can get the report  -Ms. Headings is clinically doing well.  Tolerating anastrozole without significant side effects.  Exam is benign, labs are stable.  There is no clinical evidence of recurrence.  She is 5 years from initial diagnosis, the recurrence risk decreases significantly.  -She will complete 5 years of antiestrogen therapy in 06/2022, I encouraged her to continue until her next follow-up in 6 months at which time we will likely stop AI.     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 Work up showed elevated erythropoietin which supports secondary polycythemia.  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 Overall not definitve for a MPN diagnosis.  -US abdomen 04/11/20 shows mild splenomegaly, fatty liver and benign cystic liver lesions.  -Bone marrow biopsy from 04/29/20 showed no morphologic evidence of carcinoma or hematopoietic neoplasm.  -Overall work up indicates polycythemia secondary to her heavy smoking history and COPD. she has been encouraged to quit  -no phlebotomy or medical treatment indicated for secondary polycythemia -no signs of thrombosis. Continue smoking  cessation.  She is on a baby aspirin -CBC is stable with Hgb 16.4 and HCT 50.8%    3. 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 Iron deficiency without anemia -Her 2021 labs showed evidence of iron deficiency, no clinical evidence of bleeding  -colonoscopy 05/2018 showed diverticulosis and had 3-4 polyps removed which can lead to bleeding.  -she was recommended to start oral iron but did not.  -Ferritin 05/2021 was 8, she subsequently received 3 doses of IV Venofer and responded relatively well  -Repeat EGD/colonoscopy 07/31/2021 by Dr. Elnoria Howard showed a hiatal hernia, tubular adenoma in the ascending colon, and a hyperplastic polyp in the rectum -Today's ferritin level is pending   5. Osteopenia  -Continue calcium and vitamin D -Based on her DEXA from 06/30/17 she is osteopenic with a left femur neck T-Score of -2.4  -She has started risedronate by her PCP, tolerating well -DEXA was not done in 2020, we have previously requested most recent scan from [facility]   6. Smoking and lung nodule, Emphysema  -She has heavy smoking history since in her late 39s, up to 2 packs/day  -low-dose CT scan 02/2018 for lung cancer screening showed several small pulmonary nodules. -Screening CT Chest from 12/27/19 was negative for malignancy but does show emphysema in her lungs. 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  -Sleep study 07/2020 showed sleep apnea with desat to 76%, patient reportedly started on nocturnal oxygen -Screening CT chest 03/2021 showed a new 6.6 mm lung nodule, repeat CT 06/2021 -We continue to encourage her to quit smoking, she is currently at 1 pack or less per  day  -f/u with Dr Maple Hudson     7. Comorbidities: DM, Depression and mood swing -She is on Zoloft 100 mg daily, mostly stable mood.  -Anxiety and depression fluctuate, she declined social work referral today  -Continue med regimen and F/up PCP    Plan: -Labs reviewed, we will follow-up on the pending ferritin level -Continue breast cancer surveillance and anastrozole -Mammogram 03/11/2022 at [facility], we will request the report -Follow-up in 6 months, or sooner if needed -Continue routine follow-up with medical team   All questions were answered. The patient knows to call the clinic with any problems, questions or concerns. No barriers to learning was detected. I spent 20 minutes counseling the patient face to face. The total time spent in the appointment was 30 minutes and more than 50% was on counseling and review of test results     Pollyann Samples, NP 03/03/22

## 2022-03-03 ENCOUNTER — Encounter: Payer: Self-pay | Admitting: Nurse Practitioner

## 2022-03-03 ENCOUNTER — Inpatient Hospital Stay: Payer: Medicare Other

## 2022-03-03 ENCOUNTER — Other Ambulatory Visit: Payer: Self-pay

## 2022-03-03 ENCOUNTER — Inpatient Hospital Stay: Payer: Medicare Other | Attending: Nurse Practitioner | Admitting: Nurse Practitioner

## 2022-03-03 VITALS — BP 108/63 | HR 60 | Temp 97.9°F | Resp 18 | Ht 65.0 in | Wt 115.1 lb

## 2022-03-03 DIAGNOSIS — F17200 Nicotine dependence, unspecified, uncomplicated: Secondary | ICD-10-CM | POA: Insufficient documentation

## 2022-03-03 DIAGNOSIS — Z170 Estrogen receptor positive status [ER+]: Secondary | ICD-10-CM | POA: Diagnosis not present

## 2022-03-03 DIAGNOSIS — J439 Emphysema, unspecified: Secondary | ICD-10-CM | POA: Insufficient documentation

## 2022-03-03 DIAGNOSIS — E611 Iron deficiency: Secondary | ICD-10-CM | POA: Insufficient documentation

## 2022-03-03 DIAGNOSIS — C50212 Malignant neoplasm of upper-inner quadrant of left female breast: Secondary | ICD-10-CM

## 2022-03-03 DIAGNOSIS — R911 Solitary pulmonary nodule: Secondary | ICD-10-CM | POA: Insufficient documentation

## 2022-03-03 DIAGNOSIS — Z79899 Other long term (current) drug therapy: Secondary | ICD-10-CM | POA: Diagnosis not present

## 2022-03-03 DIAGNOSIS — M8580 Other specified disorders of bone density and structure, unspecified site: Secondary | ICD-10-CM | POA: Diagnosis not present

## 2022-03-03 DIAGNOSIS — D696 Thrombocytopenia, unspecified: Secondary | ICD-10-CM | POA: Diagnosis not present

## 2022-03-03 DIAGNOSIS — F32A Depression, unspecified: Secondary | ICD-10-CM | POA: Insufficient documentation

## 2022-03-03 DIAGNOSIS — D751 Secondary polycythemia: Secondary | ICD-10-CM

## 2022-03-03 DIAGNOSIS — E119 Type 2 diabetes mellitus without complications: Secondary | ICD-10-CM | POA: Diagnosis not present

## 2022-03-03 LAB — COMPREHENSIVE METABOLIC PANEL
ALT: 13 U/L (ref 0–44)
AST: 16 U/L (ref 15–41)
Albumin: 4.5 g/dL (ref 3.5–5.0)
Alkaline Phosphatase: 73 U/L (ref 38–126)
Anion gap: 7 (ref 5–15)
BUN: 9 mg/dL (ref 8–23)
CO2: 28 mmol/L (ref 22–32)
Calcium: 9.6 mg/dL (ref 8.9–10.3)
Chloride: 104 mmol/L (ref 98–111)
Creatinine, Ser: 0.61 mg/dL (ref 0.44–1.00)
GFR, Estimated: >60 mL/min (ref >60)
Glucose, Bld: 156 mg/dL — ABNORMAL HIGH (ref 70–99)
Potassium: 4.3 mmol/L (ref 3.5–5.1)
Sodium: 139 mmol/L (ref 135–145)
Total Bilirubin: 0.4 mg/dL (ref 0.3–1.2)
Total Protein: 7.5 g/dL (ref 6.5–8.1)

## 2022-03-03 LAB — CBC WITH DIFFERENTIAL/PLATELET
Abs Immature Granulocytes: 0.02 10*3/uL (ref 0.00–0.07)
Basophils Absolute: 0 10*3/uL (ref 0.0–0.1)
Basophils Relative: 0 %
Eosinophils Absolute: 0.2 10*3/uL (ref 0.0–0.5)
Eosinophils Relative: 3 %
HCT: 50.8 % — ABNORMAL HIGH (ref 36.0–46.0)
Hemoglobin: 16.4 g/dL — ABNORMAL HIGH (ref 12.0–15.0)
Immature Granulocytes: 0 %
Lymphocytes Relative: 25 %
Lymphs Abs: 1.5 10*3/uL (ref 0.7–4.0)
MCH: 25.1 pg — ABNORMAL LOW (ref 26.0–34.0)
MCHC: 32.3 g/dL (ref 30.0–36.0)
MCV: 77.8 fL — ABNORMAL LOW (ref 80.0–100.0)
Monocytes Absolute: 0.3 10*3/uL (ref 0.1–1.0)
Monocytes Relative: 5 %
Neutro Abs: 4.1 10*3/uL (ref 1.7–7.7)
Neutrophils Relative %: 67 %
Platelets: 145 10*3/uL — ABNORMAL LOW (ref 150–400)
RBC: 6.53 MIL/uL — ABNORMAL HIGH (ref 3.87–5.11)
RDW: 16.9 % — ABNORMAL HIGH (ref 11.5–15.5)
WBC: 6.1 10*3/uL (ref 4.0–10.5)
nRBC: 0 % (ref 0.0–0.2)

## 2022-03-03 LAB — FERRITIN: Ferritin: 8 ng/mL — ABNORMAL LOW (ref 11–307)

## 2022-03-03 NOTE — Progress Notes (Signed)
This nurse requested that results from Dexa Scan and Mammogram results faxed to the office.  No further concerns at this time.

## 2022-03-04 ENCOUNTER — Telehealth: Payer: Self-pay | Admitting: Hematology

## 2022-03-04 NOTE — Telephone Encounter (Signed)
Scheduled per 5/24 los, pt has been called and confirmed  

## 2022-03-09 DIAGNOSIS — Z1231 Encounter for screening mammogram for malignant neoplasm of breast: Secondary | ICD-10-CM | POA: Diagnosis not present

## 2022-03-10 DIAGNOSIS — I10 Essential (primary) hypertension: Secondary | ICD-10-CM | POA: Diagnosis not present

## 2022-03-10 DIAGNOSIS — E1165 Type 2 diabetes mellitus with hyperglycemia: Secondary | ICD-10-CM | POA: Diagnosis not present

## 2022-03-10 DIAGNOSIS — E7800 Pure hypercholesterolemia, unspecified: Secondary | ICD-10-CM | POA: Diagnosis not present

## 2022-03-13 DIAGNOSIS — J449 Chronic obstructive pulmonary disease, unspecified: Secondary | ICD-10-CM | POA: Diagnosis not present

## 2022-03-15 DIAGNOSIS — J449 Chronic obstructive pulmonary disease, unspecified: Secondary | ICD-10-CM | POA: Diagnosis not present

## 2022-03-17 DIAGNOSIS — H2513 Age-related nuclear cataract, bilateral: Secondary | ICD-10-CM | POA: Diagnosis not present

## 2022-03-17 DIAGNOSIS — E119 Type 2 diabetes mellitus without complications: Secondary | ICD-10-CM | POA: Diagnosis not present

## 2022-03-25 ENCOUNTER — Other Ambulatory Visit: Payer: Self-pay | Admitting: Internal Medicine

## 2022-03-25 DIAGNOSIS — D751 Secondary polycythemia: Secondary | ICD-10-CM | POA: Diagnosis not present

## 2022-03-25 DIAGNOSIS — E1121 Type 2 diabetes mellitus with diabetic nephropathy: Secondary | ICD-10-CM | POA: Diagnosis not present

## 2022-03-25 DIAGNOSIS — I10 Essential (primary) hypertension: Secondary | ICD-10-CM | POA: Diagnosis not present

## 2022-03-25 DIAGNOSIS — E7800 Pure hypercholesterolemia, unspecified: Secondary | ICD-10-CM | POA: Diagnosis not present

## 2022-03-25 DIAGNOSIS — E1165 Type 2 diabetes mellitus with hyperglycemia: Secondary | ICD-10-CM | POA: Diagnosis not present

## 2022-03-25 DIAGNOSIS — Z794 Long term (current) use of insulin: Secondary | ICD-10-CM | POA: Diagnosis not present

## 2022-04-11 NOTE — Progress Notes (Signed)
Cardiology Office Note   Date:  04/12/2022   ID:  MATTHEW PAIS, DOB [birth year], MRN [id number]  PCP:  Monica Seashore, MD  Cardiologist:   Minus Breeding, MD    Chief Complaint  Patient presents with   Coronary Artery Disease     History of Present Illness: Monica [name] is a 76 y.o. female who presents for evaluation of coronary artery disease. She had previous stenting to an LAD. This was in 2003 with DES.  In 2016  POET ([place] Exercise Treadmill) was negative.  Since I last saw her she says she has done well.  She denies any cardiovascular symptoms.  She does not have any palpitations, presyncope or syncope.  She denies any chest pressure, neck or arm discomfort.  She wears O2 at night when she sleeps.  She sees Dr. Annamaria Boots for her COPD     Past Medical History:  Diagnosis Date   CAD (coronary artery disease)    Cypher stent 09/2002   Cancer ([place]) 2018   left breast, radiation therapy   CHF (congestive heart failure) ([place])    Depression    Diabetes mellitus ([place])    Hyperlipidemia    Hypertension    Myocardial infarction ([place])    2003,went into cardiac shock   Osteoporosis    Seizures ([place])    1st seizure age late 89's; most recent 07/28/17   Sinusitis     Past Surgical History:  Procedure Laterality Date   ABDOMINAL HYSTERECTOMY     APPENDECTOMY     BREAST LUMPECTOMY WITH RADIOACTIVE SEED AND SENTINEL LYMPH NODE BIOPSY Left 03/16/2017   Procedure: INJECT BLUE DYE LEFT BREAST, LEFT BREAST LUMPECTOMY WITH RADIOACTIVE SEED AND LEFT AXILLARY DEEP SENTINEL LYMPH NODE  BIOPSY ADJACENT TISSUE TRANSFER;  Surgeon: [name], MD;  Location: [facility];  Service: General;  Laterality: Left;   CARPAL TUNNEL RELEASE Bilateral    COLONOSCOPY WITH PROPOFOL N/A 05/30/2015   Procedure: COLONOSCOPY WITH PROPOFOL;  Surgeon: Carol Ada, MD;  Location: [facility];  Service: Endoscopy;  Laterality: N/A;   COLONOSCOPY WITH PROPOFOL N/A 06/09/2018   Procedure:  COLONOSCOPY WITH PROPOFOL;  Surgeon: Carol Ada, MD;  Location: [facility];  Service: Endoscopy;  Laterality: N/A;   COLONOSCOPY WITH PROPOFOL N/A 07/31/2021   Procedure: COLONOSCOPY WITH PROPOFOL;  Surgeon: Carol Ada, MD;  Location: [facility];  Service: Endoscopy;  Laterality: N/A;   CORONARY ANGIOPLASTY WITH STENT PLACEMENT  10-02-2002   ESOPHAGOGASTRODUODENOSCOPY (EGD) WITH PROPOFOL N/A 07/31/2021   Procedure: ESOPHAGOGASTRODUODENOSCOPY (EGD) WITH PROPOFOL;  Surgeon: Carol Ada, MD;  [facility]: [facility];  Service: Endoscopy;  Laterality: N/A;   Exploratory abdominal     Bleeding in abdomen after tubal   POLYPECTOMY  06/09/2018   Procedure: POLYPECTOMY;  Surgeon: Carol Ada, MD;  [facility]: [facility];  Service: Endoscopy;;   POLYPECTOMY  07/31/2021   Procedure: POLYPECTOMY;  Surgeon: Carol Ada, MD;  Location: [facility];  Service: Endoscopy;;   TUBAL LIGATION     UMBILICAL HERNIA REPAIR       Current Outpatient Medications  Medication Sig Dispense Refill   acetaminophen (TYLENOL) 500 MG tablet Take 1,000 mg by mouth every 6 (six) hours as needed for moderate pain or headache.     albuterol (VENTOLIN HFA) 108 (90 Base) MCG/ACT inhaler INHALE 2 PUFFS BY MOUTH EVERY 6 HOURS AS NEEDED FOR WHEEZING OR  SHORTHNESS  OF  BREATH 9 g 11   anastrozole (ARIMIDEX) 1 MG tablet Take 1 tablet (1  mg total) by mouth daily. 90 tablet 3   aspirin EC 81 MG tablet Take 81 mg by mouth at bedtime.     atorvastatin (LIPITOR) 40 MG tablet Take 40 mg by mouth at bedtime.     B-D ULTRAFINE III SHORT PEN 31G X 8 MM MISC Inject into the skin as directed.     Calcium Carbonate-Vitamin D (CALTRATE 600+D PO) Take 1 tablet by mouth in the morning and at bedtime.     carvedilol (COREG) 3.125 MG tablet Take 3.125 mg by mouth 2 (two) times daily.      clonazePAM (KLONOPIN) 1 MG tablet Take 1 mg by mouth in the morning and at bedtime.     Fluticasone-Umeclidin-Vilant (TRELEGY ELLIPTA) 100-62.5-25  MCG/ACT AEPB INHALE 1 PUFF ONCE DAILY RINSE  MOUTH  AFTER 60 each 11   halobetasol (ULTRAVATE) 0.05 % cream Apply 1 application topically 2 (two) times daily as needed (for rash).      Insulin Detemir (LEVEMIR) 100 UNIT/ML Pen Inject 17-25 Units into the skin See admin instructions. Inject 25 units subcutaneously in the morning and inject 17 units subcutaneously at night     insulin lispro (HUMALOG) 100 UNIT/ML KwikPen Inject 7 Units into the skin in the morning, at noon, and at bedtime.     levETIRAcetam (KEPPRA) 500 MG tablet Take 1 tablet (500 mg total) by mouth 2 (two) times daily. 180 tablet 3   losartan (COZAAR) 50 MG tablet Take 25 mg by mouth at bedtime.     Multiple Vitamins-Minerals (MEGAVITE FRUITS & VEGGIES PO) Take 6 tablets by mouth in the morning. Balance of Nature Fruit and Vegetable (3 Fruits & 3 Veggies)     nitroGLYCERIN (NITROSTAT) 0.4 MG SL tablet Place 1 tablet (0.4 mg total) under the tongue every 5 (five) minutes as needed for chest pain. 25 tablet 2   Omega-3 Fatty Acids (FISH OIL) 1000 MG CAPS Take 1,000 mg by mouth in the morning and at bedtime.     omeprazole (PRILOSEC) 20 MG capsule Take 20 mg by mouth in the morning.     ONETOUCH VERIO test strip USE THREE TIMES A DAY AND AS NEEDED FOR HYPOGLYCEMIA     OXYGEN Inhale 2 L into the lungs at bedtime.     pioglitazone (ACTOS) 15 MG tablet Take 15 mg by mouth at bedtime.     Polyethyl Glycol-Propyl Glycol 0.4-0.3 % SOLN Place 1 drop into both eyes 2 (two) times daily as needed (itchy/irritated eyes.).     risedronate (ACTONEL) 150 MG tablet Take 150 mg by mouth every 30 (thirty) days.     sertraline (ZOLOFT) 100 MG tablet Take 100 mg by mouth at bedtime.     TRIJARDY XR 12.5-2.02-999 MG TB24 Take 1 tablet by mouth 2 (two) times daily.     dicyclomine (BENTYL) 20 MG tablet Take 20 mg by mouth 3 (three) times daily as needed (abdominal spasms). (Patient not taking: Reported on 04/12/2022)     No current facility-administered  medications for this visit.    Allergies:   Ace inhibitors, Keflex [cephalexin], Phenobarbital, Sulfate, Altace [ramipril], and Dicyclomine hcl    ROS:  Please see the history of present illness.   Otherwise, review of systems are positive for none.   All other systems are reviewed and negative.    PHYSICAL EXAM: VS:  BP 120/64   Pulse 63   Ht 5\' 5"  (1.651 m)   Wt 111 lb 3.2 oz (50.4 kg)   SpO2 97%  BMI 18.50 kg/m  , BMI Body mass index is 18.5 kg/m.  GENERAL:  Well appearing NECK:  No jugular venous distention, waveform within normal limits, carotid upstroke brisk and symmetric, no bruits, no thyromegaly LUNGS:  Clear to auscultation bilaterally CHEST:  Unremarkable HEART:  PMI not displaced or sustained,S1 and S2 within normal limits, no S3, no S4, no clicks, no rubs, no murmurs ABD:  Flat, positive bowel sounds normal in frequency in pitch, no bruits, no rebound, no guarding, no midline pulsatile mass, no hepatomegaly, no splenomegaly EXT:  2 plus pulses throughout, no edema, no cyanosis no clubbing   EKG:  EKG is   ordered today. Sinus rhythm, rate 63, right bundle branch block, no acute ST-T wave changes.  No change from previous.  QT is prolonged  Recent Labs:    Wt Readings from Last 3 Encounters:  04/12/22 111 lb 3.2 oz (50.4 kg)  03/03/22 115 lb 1.6 oz (52.2 kg)  02/11/22 113 lb 9.6 oz (51.5 kg)      Other studies Reviewed: Additional studies/ records that were reviewed today include: Labs Review of the above records demonstrates: See elsewhere   ASSESSMENT AND PLAN:  CAD:   She has no new symptoms.  No further cardiovascular testing is suggested.  DYSLIPIDEMIA:    LDL was 50 previously but she has not had this done in over a year.  I will check a fasting lipid profile today.   TOBACCO: She quit smoking!  DM:    A1c was 7.2 most recently and she is having this followed by her primary provider.   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  I did give her information so that she can avoid further QT prolonging drugs.     Current medicines are reviewed at length with the patient today.  The patient does not have concerns regarding medicines.  The following changes have been made: None  Labs/ tests ordered today include:    Orders Placed This Encounter  Procedures   Lipid panel   EKG 12-Lead      Disposition:   FU with me in 12 months.     Signed, Minus Breeding, MD  04/12/2022 10:56 AM    [facility]

## 2022-04-12 ENCOUNTER — Encounter: Payer: Self-pay | Admitting: Cardiology

## 2022-04-12 ENCOUNTER — Ambulatory Visit: Payer: Medicare Other | Admitting: Cardiology

## 2022-04-12 VITALS — BP 120/64 | HR 63 | Ht 65.0 in | Wt 111.2 lb

## 2022-04-12 DIAGNOSIS — I2510 Atherosclerotic heart disease of native coronary artery without angina pectoris: Secondary | ICD-10-CM | POA: Diagnosis not present

## 2022-04-12 DIAGNOSIS — E118 Type 2 diabetes mellitus with unspecified complications: Secondary | ICD-10-CM

## 2022-04-12 DIAGNOSIS — R9431 Abnormal electrocardiogram [ECG] [EKG]: Secondary | ICD-10-CM

## 2022-04-12 DIAGNOSIS — E785 Hyperlipidemia, unspecified: Secondary | ICD-10-CM | POA: Diagnosis not present

## 2022-04-12 DIAGNOSIS — J449 Chronic obstructive pulmonary disease, unspecified: Secondary | ICD-10-CM | POA: Diagnosis not present

## 2022-04-12 DIAGNOSIS — Z720 Tobacco use: Secondary | ICD-10-CM | POA: Diagnosis not present

## 2022-04-12 DIAGNOSIS — G4730 Sleep apnea, unspecified: Secondary | ICD-10-CM

## 2022-04-12 LAB — LIPID PANEL
Chol/HDL Ratio: 3.5 ratio (ref 0.0–4.4)
Cholesterol, Total: 113 mg/dL (ref 100–199)
HDL: 32 mg/dL — ABNORMAL LOW (ref >39)
LDL Chol Calc (NIH): 53 mg/dL (ref 0–99)
Triglycerides: 168 mg/dL — ABNORMAL HIGH (ref 0–149)
VLDL Cholesterol Cal: 28 mg/dL (ref 5–40)

## 2022-04-12 NOTE — Patient Instructions (Signed)
Medication Instructions:  Watch for medications that may cause QT prolongation  *If you need a refill on your cardiac medications before your next appointment, please call your pharmacy*   Lab Work: Your provider would like for you to have the following labs today: Lipids  If you have labs (blood work) drawn today and your tests are completely normal, you will receive your results only by: [facility] (if you have MyChart) OR A paper copy in the mail If you have any lab test that is abnormal or we need to change your treatment, we will call you to review the results.   Testing/Procedures: None ordered   Follow-Up: At [facility], you and your health needs are our priority.  As part of our continuing mission to provide you with exceptional heart care, we have created designated Provider Care Teams.  These Care Teams include your primary Cardiologist (physician) and Advanced Practice Providers (APPs -  Physician Assistants and Nurse Practitioners) who all work together to provide you with the care you need, when you need it.  We recommend signing up for the patient portal called "MyChart".  Sign up information is provided on this After Visit Summary.  MyChart is used to connect with patients for Virtual Visits (Telemedicine).  Patients are able to view lab/test results, encounter notes, upcoming appointments, etc.  Non-urgent messages can be sent to your provider as well.   To learn more about what you can do with MyChart, go to [email or web address].    Your next appointment:   12 month(s)  The format for your next appointment:   In Person  Provider:   Dr. Percival Spanish  Important Information About Sugar

## 2022-04-14 DIAGNOSIS — J449 Chronic obstructive pulmonary disease, unspecified: Secondary | ICD-10-CM | POA: Diagnosis not present

## 2022-04-16 ENCOUNTER — Encounter: Payer: Self-pay | Admitting: *Deleted

## 2022-05-10 DIAGNOSIS — E1165 Type 2 diabetes mellitus with hyperglycemia: Secondary | ICD-10-CM | POA: Diagnosis not present

## 2022-05-10 DIAGNOSIS — I10 Essential (primary) hypertension: Secondary | ICD-10-CM | POA: Diagnosis not present

## 2022-05-10 DIAGNOSIS — E7800 Pure hypercholesterolemia, unspecified: Secondary | ICD-10-CM | POA: Diagnosis not present

## 2022-05-13 DIAGNOSIS — J449 Chronic obstructive pulmonary disease, unspecified: Secondary | ICD-10-CM | POA: Diagnosis not present

## 2022-05-15 DIAGNOSIS — J449 Chronic obstructive pulmonary disease, unspecified: Secondary | ICD-10-CM | POA: Diagnosis not present

## 2022-05-21 ENCOUNTER — Telehealth: Payer: Self-pay | Admitting: Hematology

## 2022-05-21 NOTE — Telephone Encounter (Signed)
Rescheduled upcoming appointment due to provider's PAL. Patient is aware of changes. ?

## 2022-06-03 DIAGNOSIS — H25013 Cortical age-related cataract, bilateral: Secondary | ICD-10-CM | POA: Diagnosis not present

## 2022-06-03 DIAGNOSIS — H2512 Age-related nuclear cataract, left eye: Secondary | ICD-10-CM | POA: Diagnosis not present

## 2022-06-03 DIAGNOSIS — H18413 Arcus senilis, bilateral: Secondary | ICD-10-CM | POA: Diagnosis not present

## 2022-06-03 DIAGNOSIS — H2513 Age-related nuclear cataract, bilateral: Secondary | ICD-10-CM | POA: Diagnosis not present

## 2022-06-03 DIAGNOSIS — H25043 Posterior subcapsular polar age-related cataract, bilateral: Secondary | ICD-10-CM | POA: Diagnosis not present

## 2022-06-13 DIAGNOSIS — J449 Chronic obstructive pulmonary disease, unspecified: Secondary | ICD-10-CM | POA: Diagnosis not present

## 2022-06-15 DIAGNOSIS — J449 Chronic obstructive pulmonary disease, unspecified: Secondary | ICD-10-CM | POA: Diagnosis not present

## 2022-06-23 DIAGNOSIS — E1165 Type 2 diabetes mellitus with hyperglycemia: Secondary | ICD-10-CM | POA: Diagnosis not present

## 2022-06-23 DIAGNOSIS — Z79899 Other long term (current) drug therapy: Secondary | ICD-10-CM | POA: Diagnosis not present

## 2022-06-23 DIAGNOSIS — E7800 Pure hypercholesterolemia, unspecified: Secondary | ICD-10-CM | POA: Diagnosis not present

## 2022-07-01 DIAGNOSIS — Z720 Tobacco use: Secondary | ICD-10-CM | POA: Diagnosis not present

## 2022-07-01 DIAGNOSIS — Z853 Personal history of malignant neoplasm of breast: Secondary | ICD-10-CM | POA: Diagnosis not present

## 2022-07-01 DIAGNOSIS — D751 Secondary polycythemia: Secondary | ICD-10-CM | POA: Diagnosis not present

## 2022-07-01 DIAGNOSIS — E7800 Pure hypercholesterolemia, unspecified: Secondary | ICD-10-CM | POA: Diagnosis not present

## 2022-07-01 DIAGNOSIS — M810 Age-related osteoporosis without current pathological fracture: Secondary | ICD-10-CM | POA: Diagnosis not present

## 2022-07-01 DIAGNOSIS — I10 Essential (primary) hypertension: Secondary | ICD-10-CM | POA: Diagnosis not present

## 2022-07-01 DIAGNOSIS — E1165 Type 2 diabetes mellitus with hyperglycemia: Secondary | ICD-10-CM | POA: Diagnosis not present

## 2022-07-01 DIAGNOSIS — E1121 Type 2 diabetes mellitus with diabetic nephropathy: Secondary | ICD-10-CM | POA: Diagnosis not present

## 2022-07-01 DIAGNOSIS — N3000 Acute cystitis without hematuria: Secondary | ICD-10-CM | POA: Diagnosis not present

## 2022-07-01 DIAGNOSIS — K219 Gastro-esophageal reflux disease without esophagitis: Secondary | ICD-10-CM | POA: Diagnosis not present

## 2022-07-01 DIAGNOSIS — Z794 Long term (current) use of insulin: Secondary | ICD-10-CM | POA: Diagnosis not present

## 2022-07-13 DIAGNOSIS — J449 Chronic obstructive pulmonary disease, unspecified: Secondary | ICD-10-CM | POA: Diagnosis not present

## 2022-07-15 DIAGNOSIS — J449 Chronic obstructive pulmonary disease, unspecified: Secondary | ICD-10-CM | POA: Diagnosis not present

## 2022-07-22 ENCOUNTER — Ambulatory Visit
Admission: RE | Admit: 2022-07-22 | Discharge: 2022-07-22 | Disposition: A | Discharge status: Home / Self Care | Payer: Medicare Other | Source: Ambulatory Visit | Attending: Internal Medicine | Admitting: Internal Medicine

## 2022-07-22 DIAGNOSIS — K7689 Other specified diseases of liver: Secondary | ICD-10-CM | POA: Diagnosis not present

## 2022-07-22 DIAGNOSIS — Z87891 Personal history of nicotine dependence: Secondary | ICD-10-CM

## 2022-07-22 DIAGNOSIS — J432 Centrilobular emphysema: Secondary | ICD-10-CM | POA: Diagnosis not present

## 2022-07-22 DIAGNOSIS — I2510 Atherosclerotic heart disease of native coronary artery without angina pectoris: Secondary | ICD-10-CM | POA: Diagnosis not present

## 2022-07-22 DIAGNOSIS — F17210 Nicotine dependence, cigarettes, uncomplicated: Secondary | ICD-10-CM

## 2022-07-26 ENCOUNTER — Telehealth: Payer: Self-pay | Admitting: Acute Care

## 2022-07-26 DIAGNOSIS — R911 Solitary pulmonary nodule: Secondary | ICD-10-CM

## 2022-07-26 NOTE — Telephone Encounter (Signed)
CT results faxed to PCP with follow up plans included. Spoke with pt and schedule appt with Dr Valeta Harms on 08/11/22 at 11:00 to follow up after PET scan. Nothing further needed at this time.

## 2022-07-26 NOTE — Telephone Encounter (Signed)
See other telephone note from 07/26/2022.

## 2022-07-26 NOTE — Telephone Encounter (Signed)
Received call report from Opal Sidles at [facility]:   IMPRESSION: 1. Lung-RADS 4B, suspicious. Apparent central suprahilar left upper lobe pulmonary mass encasing the left upper lobe airway, not well seen on this noncontrast study. Associated bulky AP window and prevascular lymphadenopathy in the mediastinum. Immediate follow-up CT chest with contrast recommended and consultation with Pulmonology or Thoracic Surgery also recommended. 2. Interval development of pre-vascular and AP window lymphadenopathy, as above. Imaging features are concerning for metastatic disease although lymphoma lymphoma could have this appearance. 3. Ascending thoracic aorta measures 4.3 cm diameter. Attention on follow-up recommended. 4. Aortic Atherosclerosis (ICD10-I70.0) and Emphysema (ICD10-J43.9).

## 2022-07-26 NOTE — Telephone Encounter (Signed)
I have called the patient with the  results of her screening CT Chest. I explained that he scan was read as a Lung RADS 4 B indicates suspicious findings for which additional diagnostic testing and or tissue sampling is recommended. I told her next steps were PET scan and then follow up in the office to review findings. She is in agreement with the above. Langley Gauss, I will place order for PET and if you can keep an eye out for when it is scheduled, please schedule her with either  [facility],  [facility] or [facility] , whoever has the appointment first available after her PET is completed. If there is too long a gap, I can see her, but would prefer she see the doc who could potentially be doing her navigational bronch and biopsies.  Please let her PCP know, and let them know plan for follow up.   Of note>> Pt. Has a history of left sided breast cancer, she is 5 years breast cancer free Thanks so much [place]

## 2022-08-02 ENCOUNTER — Other Ambulatory Visit: Payer: Self-pay | Admitting: Nurse Practitioner

## 2022-08-02 DIAGNOSIS — E2839 Other primary ovarian failure: Secondary | ICD-10-CM

## 2022-08-05 ENCOUNTER — Encounter (HOSPITAL_COMMUNITY)
Admission: RE | Admit: 2022-08-05 | Discharge: 2022-08-05 | Disposition: A | Discharge status: Home / Self Care | Payer: Medicare Other | Source: Ambulatory Visit | Attending: Acute Care | Admitting: Acute Care

## 2022-08-05 DIAGNOSIS — R911 Solitary pulmonary nodule: Secondary | ICD-10-CM | POA: Diagnosis not present

## 2022-08-05 LAB — GLUCOSE, CAPILLARY: Glucose-Capillary: 118 mg/dL — ABNORMAL HIGH (ref 70–99)

## 2022-08-05 MED ORDER — FLUDEOXYGLUCOSE F - 18 (FDG) INJECTION
5.6000 | Freq: Once | INTRAVENOUS | Status: AC
  Administered 2022-08-05: 5.2 via INTRAVENOUS

## 2022-08-11 ENCOUNTER — Institutional Professional Consult (permissible substitution): Payer: Medicare Other | Admitting: Pulmonary Disease

## 2022-08-13 DIAGNOSIS — J449 Chronic obstructive pulmonary disease, unspecified: Secondary | ICD-10-CM | POA: Diagnosis not present

## 2022-08-15 DIAGNOSIS — J449 Chronic obstructive pulmonary disease, unspecified: Secondary | ICD-10-CM | POA: Diagnosis not present

## 2022-08-18 ENCOUNTER — Institutional Professional Consult (permissible substitution): Payer: Medicare Other | Admitting: Pulmonary Disease

## 2022-09-01 ENCOUNTER — Encounter: Payer: Self-pay | Admitting: Emergency Medicine

## 2022-09-01 ENCOUNTER — Ambulatory Visit: Payer: Medicare Other | Admitting: Emergency Medicine

## 2022-09-01 VITALS — BP 112/68 | HR 62 | Temp 97.8°F | Ht 65.0 in | Wt 105.4 lb

## 2022-09-01 DIAGNOSIS — R911 Solitary pulmonary nodule: Secondary | ICD-10-CM

## 2022-09-01 DIAGNOSIS — R9389 Abnormal findings on diagnostic imaging of other specified body structures: Secondary | ICD-10-CM

## 2022-09-01 NOTE — Patient Instructions (Addendum)
We reviewed your CT scan and your PET scan today. We will speak with Interventional Radiology to arrange for a needle biopsy of an enlarged lymph node above your left collarbone. Based on your biopsy results we will review findings with Dr. Burr Medico and oncology and plan next steps Follow Dr. Lamonte Sakai in 1 month or next available, sooner if you have any problems.

## 2022-09-01 NOTE — Assessment & Plan Note (Signed)
She has a left hilar mass that would be accessible by EBUS, is hypermetabolic on PET scan.  She also has a hypermetabolic left supraclavicular node that should be accessible for biopsy.  I discussed with her today the pros and cons of EBUS versus needle biopsy.  Ultimately recommended ultrasound-guided needle biopsy of the supraclavicular node in order to avoid the risk associated with general anesthesia and bronchoscopy.  If the node is not accessible then we will arrange for EBUS as soon as possible.  She has been followed by Dr. Burr Medico for her breast cancer.  When I have a tissue diagnosis then will coordinate results with her to plan next steps in therapy.

## 2022-09-01 NOTE — Progress Notes (Signed)
[name], MD  Monica [name] D; P [facility] Procedure Requests BIOPSY REVIEW Date: 09/01/22  Requested Biopsy site: Lung nodule Reason for request: HM on PET  Imaging review: I reviewed all pertinent diagnostic studies, including; PET CT, 08/05/22 Recommended imaging modality to perform biopsy: None. Declined. Additional comments: Extremely high risk percutaneous lung Bx. Consider bronchoscopic Bx if lung lesion is desired. I tried to call Dr. Agustina Caroli office without response.  *On review, HM L supraclavicular LN. This is amenable to percutaneous Bx Recommended imaging modality to perform biopsy: Ultrasound Additional comments: @Schedulers , if ordering MD is OK with L supraclavicular LN Bx then OK to proceed to schedule.  Please contact me with questions, concerns, or if issue pertaining to this request arise.  [name], MD [facility]  Pager. [place]

## 2022-09-01 NOTE — Progress Notes (Signed)
Subjective:    Patient ID: Monica [name], female    DOB: November 02, 1945, 76 y.o.   MRN: [id number]  HPI 76 year old former smoker with a history of left-sided breast cancer treated by PMH also significant for CAD/PTCI, hyperlipidemia, hypertension, diabetes, seizures. She is referred today for an abnormal CT scan of the chest.  She underwent screening CT 07/22/2022, prompted PET scan as below. She reports She has felt pretty well, although has had fatigue, some difficulty doing her housework. She has lost some wt > about 15 lbs over several months time.   CT scan of the chest 07/22/2022 reviewed by me, shows development of prevascular and left hilar lymphadenopathy extending into the AP window suggestive of suprahilar left upper lobe mass, advanced centrilobular and paraseptal emphysema, tiny bilateral stable pulmonary nodules  PET scan 08/05/2022 reviewed by me, shows hypermetabolic left hilar and suprahilar mass that extends into the AP window and inferior left hilum, 20 mm in the short axis.  There is a small hypermetabolic posterior left upper lobe focus adjacent to the fissure.  No paratracheal or subcarinal lymphadenopathy, no involvement on the right.  There is hypermetabolic prevascular and left supraclavicular lymphadenopathy concerning for metastatic disease   Review of Systems As per HPI  Past Medical History:  Diagnosis Date   CAD (coronary artery disease)    Cypher stent 09/2002   Cancer ([place]) 2018   left breast, radiation therapy   CHF (congestive heart failure) ([place])    Depression    Diabetes mellitus ([place])    Hyperlipidemia    Hypertension    Myocardial infarction ([place])    2003,went into cardiac shock   Osteoporosis    Seizures ([place])    1st seizure age late 73's; most recent 07/28/17   Sinusitis      Family History  Problem Relation Age of Onset   Dementia Mother    Kidney disease Mother    Heart attack Father 42       Died age 19   Heart attack Brother 22    Heart attack Brother 24     Social History   Socioeconomic History   Marital status: Married    Spouse name: Sonia Side   Number of children: 3   Years of education: 12   Highest education level: Not on file  Occupational History   Not on file  Tobacco Use   Smoking status: Former    Packs/day: 2.00    Years: 30.00    Total pack years: 60.00    Types: Cigarettes    Quit date: 12/28/2020    Years since quitting: 1.6   Smokeless tobacco: Never   Tobacco comments:    quit mid march 2022  Vaping Use   Vaping Use: Never used  Substance and Sexual Activity   Alcohol use: No    Alcohol/week: 0.0 standard drinks of alcohol   Drug use: No   Sexual activity: Not on file  Other Topics Concern   Not on file  Social History Narrative   Lives with husband.    Caffeine-    Social Determinants of Health   Financial Resource Strain: Not on file  Food Insecurity: Not on file  Transportation Needs: Not on file  Physical Activity: Not on file  Stress: Not on file  Social Connections: Not on file  Intimate Partner Violence: Not on file     Allergies  Allergen Reactions   Ace Inhibitors Dermatitis and Cough   Keflex [Cephalexin] Itching  Phenobarbital Other (See Comments)    Black spots on hands and feet.   Sulfate Nausea And Vomiting and Other (See Comments)    Dizziness    Altace [Ramipril] Other (See Comments)    UNSPECIFIED REACTION     Dicyclomine Hcl Itching     Outpatient Medications Prior to Visit  Medication Sig Dispense Refill   acetaminophen (TYLENOL) 500 MG tablet Take 1,000 mg by mouth every 6 (six) hours as needed for moderate pain or headache.     albuterol (VENTOLIN HFA) 108 (90 Base) MCG/ACT inhaler INHALE 2 PUFFS BY MOUTH EVERY 6 HOURS AS NEEDED FOR WHEEZING OR  SHORTHNESS  OF  BREATH 9 g 11   anastrozole (ARIMIDEX) 1 MG tablet Take 1 tablet by mouth once daily 90 tablet 0   aspirin EC 81 MG tablet Take 81 mg by mouth at bedtime.     atorvastatin  (LIPITOR) 40 MG tablet Take 40 mg by mouth at bedtime.     B-D ULTRAFINE III SHORT PEN 31G X 8 MM MISC Inject into the skin as directed.     Calcium Carbonate-Vitamin D (CALTRATE 600+D PO) Take 1 tablet by mouth in the morning and at bedtime.     carvedilol (COREG) 3.125 MG tablet Take 3.125 mg by mouth 2 (two) times daily.      clonazePAM (KLONOPIN) 1 MG tablet Take 1 mg by mouth in the morning and at bedtime.     Fluticasone-Umeclidin-Vilant (TRELEGY ELLIPTA) 100-62.5-25 MCG/ACT AEPB INHALE 1 PUFF ONCE DAILY RINSE  MOUTH  AFTER 60 each 11   halobetasol (ULTRAVATE) 0.05 % cream Apply 1 application topically 2 (two) times daily as needed (for rash).      Insulin Detemir (LEVEMIR) 100 UNIT/ML Pen Inject 17-25 Units into the skin See admin instructions. Inject 25 units subcutaneously in the morning and inject 17 units subcutaneously at night     insulin lispro (HUMALOG) 100 UNIT/ML KwikPen Inject 7 Units into the skin in the morning, at noon, and at bedtime.     levETIRAcetam (KEPPRA) 500 MG tablet Take 1 tablet (500 mg total) by mouth 2 (two) times daily. 180 tablet 3   losartan (COZAAR) 50 MG tablet Take 25 mg by mouth at bedtime.     Multiple Vitamins-Minerals (MEGAVITE FRUITS & VEGGIES PO) Take 6 tablets by mouth in the morning. Balance of Nature Fruit and Vegetable (3 Fruits & 3 Veggies)     Omega-3 Fatty Acids (FISH OIL) 1000 MG CAPS Take 1,000 mg by mouth in the morning and at bedtime.     omeprazole (PRILOSEC) 20 MG capsule Take 20 mg by mouth in the morning.     ONETOUCH VERIO test strip USE THREE TIMES A DAY AND AS NEEDED FOR HYPOGLYCEMIA     OXYGEN Inhale 2 L into the lungs at bedtime.     pioglitazone (ACTOS) 15 MG tablet Take 15 mg by mouth at bedtime.     Polyethyl Glycol-Propyl Glycol 0.4-0.3 % SOLN Place 1 drop into both eyes 2 (two) times daily as needed (itchy/irritated eyes.).     risedronate (ACTONEL) 150 MG tablet Take 150 mg by mouth every 30 (thirty) days.     sertraline  (ZOLOFT) 100 MG tablet Take 100 mg by mouth at bedtime.     TRIJARDY XR 12.5-2.02-999 MG TB24 Take 1 tablet by mouth 2 (two) times daily.     nitroGLYCERIN (NITROSTAT) 0.4 MG SL tablet Place 1 tablet (0.4 mg total) under the tongue every 5 (five)  minutes as needed for chest pain. 25 tablet 2   No facility-administered medications prior to visit.        Objective:   Physical Exam  Vitals:   09/01/22 1411  BP: 112/68  Pulse: 62  Temp: 97.8 F (36.6 C)  TempSrc: Oral  SpO2: 96%  Weight: 105 lb 6.4 oz (47.8 kg)  Height: 5\' 5"  (1.651 m)    Gen: Pleasant, distant, in no distress,  normal affect  ENT: No lesions,  mouth clear,  oropharynx clear, no postnasal drip  Neck: No JVD, no stridor  Lungs: No use of accessory muscles, no crackles or wheezing on normal respiration, no wheeze on forced expiration  Cardiovascular: RRR, heart sounds normal, no murmur or gallops, no peripheral edema  Musculoskeletal: No deformities, no cyanosis or clubbing  Neuro: alert, awake, non focal  Skin: Warm, no lesions or rash     Assessment & Plan:  Abnormal CT of the chest She has a left hilar mass that would be accessible by EBUS, is hypermetabolic on PET scan.  She also has a hypermetabolic left supraclavicular node that should be accessible for biopsy.  I discussed with her today the pros and cons of EBUS versus needle biopsy.  Ultimately recommended ultrasound-guided needle biopsy of the supraclavicular node in order to avoid the risk associated with general anesthesia and bronchoscopy.  If the node is not accessible then we will arrange for EBUS as soon as possible.  She has been followed by Dr. Burr Medico for her breast cancer.  When I have a tissue diagnosis then will coordinate results with her to plan next steps in therapy.   Baltazar Apo, MD, PhD 09/01/2022, 3:32 PM [facility] [phone] or if no answer before 7:00PM call [phone] For any issues after 7:00PM  please call eLink [phone]

## 2022-09-03 ENCOUNTER — Ambulatory Visit: Payer: Medicare Other | Admitting: Hematology

## 2022-09-03 ENCOUNTER — Other Ambulatory Visit: Payer: Medicare Other

## 2022-09-07 ENCOUNTER — Other Ambulatory Visit: Payer: Self-pay

## 2022-09-07 DIAGNOSIS — D751 Secondary polycythemia: Secondary | ICD-10-CM

## 2022-09-07 DIAGNOSIS — Z170 Estrogen receptor positive status [ER+]: Secondary | ICD-10-CM

## 2022-09-07 NOTE — Assessment & Plan Note (Signed)
-  Her 2021 labs showed evidence of iron deficiency, no clinical evidence of bleeding  -colonoscopy 05/2018 showed diverticulosis and had 3-4 polyps removed which can lead to bleeding.  -previously did not tolerate oral iron -ferritin 06/03/21 was 8. She subsequently received 3 doses of IV Venofer.

## 2022-09-07 NOTE — Progress Notes (Unsigned)
Monica [name]   [phone] [phone]   Clinic Follow up Note   Patient Care Team: Merrilee Seashore, MD as PCP - General (Internal Medicine) Minus Breeding, MD as PCP - Cardiology (Cardiology) [name], MD as Consulting Physician (General Surgery) Truitt Merle, MD as Consulting Physician (Hematology) Kyung Rudd, MD as Consulting Physician (Radiation Oncology) Gardenia Phlegm, NP as Nurse Practitioner (Hematology and Oncology)  Date of Service:  09/08/2022  CHIEF COMPLAINT: f/u of left breast cancer, polycythemia, and iron deficiency   CURRENT THERAPY: Anastrozole 1 mg once daily starting 06/2017   ASSESSMENT:  Monica [name] is a 76 y.o. female with    1. 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 Tolerating well.  -on surveillance, mammograms annually in May at [facility], I have requested the report -physical exam was unremarkable, no clinical evidence of recurrence. Continue surveillance and anastrozole   2. lung mass with left [place] node, likely lung cancer  -She has been followed by pulmonologist Dr. West Carbo for her lung nodules -Screening CT chest on July 22, 2022 showed suprahilar left upper lobe pulmonary mass and mediastinal adenopathy -I personally reviewed her PET scan from August 08, 2022, which is highly suspicious for left lung cancer with mediastinal and left supraclavicular nodal metastasis. -Ultrasound-guided left supraclavicular lymph node biopsy is scheduled for December 21, will contact IR to move up with the biopsy.   3. 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 Work up showed elevated erythropoietin which supports secondary polycythemia.  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 Overall not definitve for a MPN diagnosis.  -US abdomen 04/11/20 shows mild splenomegaly, fatty liver and benign cystic liver lesions.  -Bone marrow biopsy from 04/29/20 showed no morphologic evidence of carcinoma or hematopoietic neoplasm.  -no signs of thrombosis. Continue smoking cessation   4. 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 Osteopenia  -Continue calcium and vitamin D -Based on her DEXA from 06/30/17 she is osteopenic with a left femur neck T-Score of -2.4  -She has started risedronate by her PCP, tolerating well -DEXA was not done in 2020, I have requested most recent scan from [place]   6. Smoking and lung nodule, Emphysema  -She has heavy smoking history since in her late 39s -she has been followed with screening chest CT's since at least 02/2018, overall no malignancy (stable nodules) and emphysema. -Sleep study 07/2020 showed sleep apnea with desat to 76%, patient reportedly started on nocturnal oxygen -Screening CT chest 03/2021 showed a new 6.6 mm lung nodule, repeat CT 07/22/21 was stable. -She continues to try to quit smoking, notes she is using lozenges at this point. -f/u with Dr Annamaria Boots     7. Comorbidities: DM, Depression and mood swing -She is on Zoloft 100 mg daily, mostly stable mood.  -Anxiety and depression fluctuate, she previously declined social work referral  -Continue med regimen and F/up PCP  PLAN: - Discuss PET Scan findings  -I will contact IR to Move Biopsy appointment up ASAP -schedule f/u after  Biopsy -will copy Dr. Lamonte Sakai      SUMMARY OF ONCOLOGIC HISTORY: Oncology History Overview Note  Cancer Staging Malignant neoplasm of upper-inner quadrant of left breast in female, estrogen receptor positive ([place]) Staging form: Breast, AJCC 8th Edition - Clinical stage from 02/16/2017: Stage IA (cT1b, cN0, cM0, G2, ER: Positive, PR: Positive, HER2: Negative) - Unsigned Staging comments: Staged at breast conference  - Pathologic stage from 03/16/2017: Stage IA (pT1b, pN0, cM0, G2, ER: Positive, PR: Positive, HER2: Negative, Oncotype DX score: 27) - Signed by Truitt Merle, MD on 04/24/2017     Malignant neoplasm of upper-inner quadrant of left breast in female, estrogen receptor positive ([place])  02/09/2017 Initial Diagnosis   Malignant neoplasm of upper-inner quadrant of left breast in female, estrogen receptor positive ([place])   02/09/2017 Initial Biopsy   Diagnosis Breast, left, needle core biopsy - INVASIVE DUCTAL CARCINOMA, 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 Dalbert Batman    03/16/2017 Pathology Results   PATHOLOGY REPORT Diagnosis 03/16/17 1. Breast, lumpectomy, Left - INVASIVE DUCTAL CARCINOMA, NOTTINGHAM GRADE 2 OF 3, 0.9 CM - DUCTAL CARCINOMA IN SITU - MARGINS UNINVOLVED BY CARCINOMA (0.3 CM POSTERIOR MARGIN) - PREVIOUS BIOPSY SITE CHANGES - SEE ONCOLOGY TABLE BELOW 2. Lymph node, sentinel, biopsy, Left axillary #1 - NO CARCINOMA IDENTIFIED IN ONE LYMPH NODE (0/1) 3. Lymph node, sentinel, biopsy, Left axillary #2 - NO CARCINOMA IDENTIFIED IN ONE LYMPH NODE (0/1) 4. Lymph node, sentinel, biopsy, Left axillary #3 - NO CARCINOMA IDENTIFIED IN ONE LYMPH NODE  (0/1) 5. Lymph node, sentinel, biopsy, Left axillary #4 - NO CARCINOMA IDENTIFIED IN ONE LYMPH NODE (0/1)    03/16/2017 Oncotype testing   Recurrance score of 27 with a 18% distance recurrance in the net 10 years with Tamoxifen alone    05/19/2017 - 06/16/2017 Radiation Therapy   Radiation with Dr. Lisbeth Renshaw   06/2017 -  Anti-estrogen oral therapy   anastrozole 1 mg once a day starting late 06/2017     04/29/2020 Pathology Results   DIAGNOSIS:   BONE MARROW, ASPIRATE, CLOT, CORE:  -  Cellular marrow with trilineage hematopoiesis  -  No morphologic evidence of carcinoma or hematopoietic neoplasm  -  See microscopic description below   PERIPHERAL BLOOD:  -  Polycythemia and thrombocytopenia  -  See complete blood cell count   MICROSCOPIC DESCRIPTION:   PERIPHERAL BLOOD SMEAR: The peripheral blood has a polycythemia and  thrombocytopenia.  Leukocytes are morphologically unremarkable.   BONE MARROW ASPIRATE: Spicular, cellular and adequate for evaluation  Erythroid precursors: Orderly maturation without overt dysplasia  Granulocytic precursors: Orderly maturation without overt dysplasia  Megakaryocytes: Qualitatively and quantitatively unremarkable  Lymphocytes/plasma cells: No lymphocytosis or plasmacytosis       INTERVAL HISTORY:  Monica [name] is here for a follow up of left breast cancer, polycythemia, and iron deficiency   She was last seen by me on 08/31/2022 She presents to the clinic alone.   Pt states she had no concern of her breast. Pt denise having any cough.Pt states she has lost weight starting back in  the Summer time.Pt states she had an mammogram done at [facility].  All other systems were reviewed with the patient and are negative.  MEDICAL HISTORY:  Past Medical History:  Diagnosis Date   CAD (coronary artery disease)    Cypher stent 09/2002   Cancer ([place]) 2018   left breast, radiation therapy   CHF (congestive heart failure) ([place])    Depression    Diabetes  mellitus ([place])    Hyperlipidemia    Hypertension    Myocardial infarction ([place])    2003,went into cardiac shock   Osteoporosis    Seizures ([place])    1st seizure age late 57's; most recent 07/28/17   Sinusitis     SURGICAL HISTORY: Past Surgical History:  Procedure Laterality Date   ABDOMINAL HYSTERECTOMY     APPENDECTOMY     BREAST LUMPECTOMY WITH RADIOACTIVE SEED AND SENTINEL LYMPH NODE BIOPSY Left 03/16/2017   Procedure: INJECT BLUE DYE LEFT BREAST, LEFT BREAST LUMPECTOMY WITH RADIOACTIVE SEED AND LEFT AXILLARY DEEP SENTINEL LYMPH NODE  BIOPSY ADJACENT TISSUE TRANSFER;  Surgeon: [name], MD;  Location: [facility];  Service: General;  Laterality: Left;   CARPAL TUNNEL RELEASE Bilateral    COLONOSCOPY WITH PROPOFOL N/A 05/30/2015   Procedure: COLONOSCOPY WITH PROPOFOL;  Surgeon: Carol Ada, MD;  Location: [facility];  Service: Endoscopy;  Laterality: N/A;   COLONOSCOPY WITH PROPOFOL N/A 06/09/2018   Procedure: COLONOSCOPY WITH PROPOFOL;  Surgeon: Carol Ada, MD;  Location: [facility];  Service: Endoscopy;  Laterality: N/A;   COLONOSCOPY WITH PROPOFOL N/A 07/31/2021   Procedure: COLONOSCOPY WITH PROPOFOL;  Surgeon: Carol Ada, MD;  Location: [facility];  Service: Endoscopy;  Laterality: N/A;   CORONARY ANGIOPLASTY WITH STENT PLACEMENT  10-02-2002   ESOPHAGOGASTRODUODENOSCOPY (EGD) WITH PROPOFOL N/A 07/31/2021   Procedure: ESOPHAGOGASTRODUODENOSCOPY (EGD) WITH PROPOFOL;  Surgeon: Carol Ada, MD;  Location: [facility];  Service: Endoscopy;  Laterality: N/A;   Exploratory abdominal     Bleeding in abdomen after tubal   POLYPECTOMY  06/09/2018   Procedure: POLYPECTOMY;  Surgeon: Carol Ada, MD;  Location: [facility];  Service: Endoscopy;;   POLYPECTOMY  07/31/2021   Procedure: POLYPECTOMY;  Surgeon: Carol Ada, MD;  Location: [facility];  Service: Endoscopy;;   TUBAL LIGATION     UMBILICAL HERNIA REPAIR      I have reviewed the social history and family  history with the patient and they are unchanged from previous note.  ALLERGIES:  is allergic to ace inhibitors, keflex [cephalexin], phenobarbital, sulfate, altace [ramipril], and dicyclomine hcl.  MEDICATIONS:  Current Outpatient Medications  Medication Sig Dispense Refill   acetaminophen (TYLENOL) 500 MG tablet Take 1,000 mg by mouth every 6 (six) hours as needed for moderate pain or headache.     albuterol (VENTOLIN HFA) 108 (90 Base) MCG/ACT inhaler INHALE 2 PUFFS BY MOUTH EVERY 6 HOURS AS NEEDED FOR WHEEZING OR  SHORTHNESS  OF  BREATH 9 g 11   anastrozole (ARIMIDEX) 1 MG tablet Take 1 tablet (1 mg total) by mouth daily. 90 tablet 1   aspirin EC 81 MG tablet Take 81 mg by mouth at bedtime.     atorvastatin (LIPITOR) 40 MG tablet Take 40 mg by mouth at bedtime.     B-D ULTRAFINE III SHORT PEN 31G X 8 MM MISC Inject into the skin as directed.     Calcium Carbonate-Vitamin D (CALTRATE 600+D PO) Take 1 tablet by mouth in the morning and at bedtime.     carvedilol (COREG) 3.125 MG  tablet Take 3.125 mg by mouth 2 (two) times daily.      clonazePAM (KLONOPIN) 1 MG tablet Take 1 mg by mouth in the morning and at bedtime.     Fluticasone-Umeclidin-Vilant (TRELEGY ELLIPTA) 100-62.5-25 MCG/ACT AEPB INHALE 1 PUFF ONCE DAILY RINSE  MOUTH  AFTER 60 each 11   halobetasol (ULTRAVATE) 0.05 % cream Apply 1 application topically 2 (two) times daily as needed (for rash).      Insulin Detemir (LEVEMIR) 100 UNIT/ML Pen Inject 17-25 Units into the skin See admin instructions. Inject 25 units subcutaneously in the morning and inject 17 units subcutaneously at night     insulin lispro (HUMALOG) 100 UNIT/ML KwikPen Inject 7 Units into the skin in the morning, at noon, and at bedtime.     levETIRAcetam (KEPPRA) 500 MG tablet Take 1 tablet (500 mg total) by mouth 2 (two) times daily. 180 tablet 3   losartan (COZAAR) 50 MG tablet Take 25 mg by mouth at bedtime.     Multiple Vitamins-Minerals (MEGAVITE FRUITS & VEGGIES  PO) Take 6 tablets by mouth in the morning. Balance of Nature Fruit and Vegetable (3 Fruits & 3 Veggies)     nitroGLYCERIN (NITROSTAT) 0.4 MG SL tablet Place 1 tablet (0.4 mg total) under the tongue every 5 (five) minutes as needed for chest pain. 25 tablet 2   Omega-3 Fatty Acids (FISH OIL) 1000 MG CAPS Take 1,000 mg by mouth in the morning and at bedtime.     omeprazole (PRILOSEC) 20 MG capsule Take 20 mg by mouth in the morning.     ONETOUCH VERIO test strip USE THREE TIMES A DAY AND AS NEEDED FOR HYPOGLYCEMIA     OXYGEN Inhale 2 L into the lungs at bedtime.     pioglitazone (ACTOS) 15 MG tablet Take 15 mg by mouth at bedtime.     Polyethyl Glycol-Propyl Glycol 0.4-0.3 % SOLN Place 1 drop into both eyes 2 (two) times daily as needed (itchy/irritated eyes.).     risedronate (ACTONEL) 150 MG tablet Take 150 mg by mouth every 30 (thirty) days.     sertraline (ZOLOFT) 100 MG tablet Take 100 mg by mouth at bedtime.     TRIJARDY XR 12.5-2.02-999 MG TB24 Take 1 tablet by mouth 2 (two) times daily.     No current facility-administered medications for this visit.    PHYSICAL EXAMINATION: ECOG PERFORMANCE STATUS: 1 - Symptomatic but completely ambulatory  Vitals:   09/08/22 1102  BP: 95/67  Pulse: 67  Resp: 16  SpO2: 98%   Wt Readings from Last 3 Encounters:  09/08/22 106 lb 3.2 oz (48.2 kg)  09/01/22 105 lb 6.4 oz (47.8 kg)  08/05/22 105 lb (47.6 kg)    GENERAL:alert, no distress and comfortable SKIN: skin color, texture, turgor are normal, no rashes or significant lesions EYES: normal, Conjunctiva are pink and non-injected, sclera clear NECK: supple, thyroid normal size, non-tender, without nodularity LYMPH:  no palpable lymphadenopathy in the cervical, axillary  LUNGS: clear to auscultation and percussion with normal breathing effort HEART: regular rate & rhythm and no murmurs and no lower extremity edema ABDOMEN:abdomen soft, non-tender and normal bowel sounds Musculoskeletal:no  cyanosis of digits and no clubbing  NEURO: alert & oriented x 3 with fluent speech, no focal motor/sensory deficits  LABORATORY DATA:  I have reviewed the data as listed    Latest Ref Rng & Units 09/08/2022   10:48 AM 03/03/2022   12:11 PM 12/01/2021   11:10 AM  CBC  WBC 4.0 - 10.5 K/uL 5.6  6.1  6.3   Hemoglobin 12.0 - 15.0 g/dL 15.1  16.4  16.7   Hematocrit 36.0 - 46.0 % 47.3  50.8  51.1   Platelets 150 - 400 K/uL 161  145  135         Latest Ref Rng & Units 09/08/2022   10:48 AM 03/03/2022   12:11 PM 12/01/2021   11:10 AM  CMP  Glucose 70 - 99 mg/dL 130  156  97   BUN 8 - 23 mg/dL _0 Creatinine 0.44 - 1.00 mg/dL 0.57  0.61  0.58   Sodium 135 - 145 mmol/L 137  139  138   Potassium 3.5 - 5.1 mmol/L 4.3  4.3  4.7   Chloride 98 - 111 mmol/L 102  104  102   CO2 22 - 32 mmol/L _1 Calcium 8.9 - 10.3 mg/dL 10.1  9.6  9.9   Total Protein 6.5 - 8.1 g/dL 7.7  7.5  7.4   Total Bilirubin 0.3 - 1.2 mg/dL 0.4  0.4  0.6   Alkaline Phos 38 - 126 U/L 57  73  55   AST 15 - 41 U/L _2 ALT 0 - 44 U/L _3 RADIOGRAPHIC STUDIES: I have personally reviewed the radiological images as listed and agreed with the findings in the report. No results found.    No orders of the defined types were placed in this encounter.  All questions were answered. The patient knows to call the clinic with any problems, questions or concerns. No barriers to learning was detected. The total time spent in the appointment was 30 minutes.     Truitt Merle, MD 09/08/2022   Felicity Coyer, CMA, am acting as scribe for Truitt Merle, MD.   I have reviewed the above documentation for accuracy and completeness, and I agree with the above.

## 2022-09-07 NOTE — Assessment & Plan Note (Signed)
-  Invasive Ductal Carcinoma and DCIS, pT1bN0M0, stage I, ER+/PR+/HER2-, G2, Oncotype RS 27   -diagnosed 02/2017, s/p left breast lumpectomy, adjuvant radiation, and adjuvant AI starting in 06/2017. Tolerating well.

## 2022-09-07 NOTE — Assessment & Plan Note (Signed)
-  secondary to smoking H/o erythrocytosis in 2019 with elevated erythropoietin. -MPN genomic testing panel 12/2019 showed negative JAK2, CALR, ABL1, KIT, etc. 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 Overall not definitve for a MPN diagnosis  -US abdomen 04/11/20 shows mild splenomegaly, fatty liver and benign cystic liver lesions.  -Bone marrow biopsy from 04/29/20 showed no morphologic evidence of carcinoma or hematopoietic neoplasm.  -no signs of thrombosis. Continue smoking cessation

## 2022-09-08 ENCOUNTER — Telehealth: Payer: Self-pay | Admitting: Medical Oncology

## 2022-09-08 ENCOUNTER — Telehealth: Payer: Self-pay | Admitting: Emergency Medicine

## 2022-09-08 ENCOUNTER — Other Ambulatory Visit: Payer: Self-pay

## 2022-09-08 ENCOUNTER — Inpatient Hospital Stay: Payer: Medicare Other | Attending: Hematology

## 2022-09-08 ENCOUNTER — Encounter: Payer: Self-pay | Admitting: Medical Oncology

## 2022-09-08 ENCOUNTER — Inpatient Hospital Stay: Payer: Medicare Other | Admitting: Hematology

## 2022-09-08 ENCOUNTER — Encounter: Payer: Self-pay | Admitting: Hematology

## 2022-09-08 VITALS — BP 95/67 | HR 67 | Resp 16 | Ht 65.0 in | Wt 106.2 lb

## 2022-09-08 DIAGNOSIS — E119 Type 2 diabetes mellitus without complications: Secondary | ICD-10-CM | POA: Insufficient documentation

## 2022-09-08 DIAGNOSIS — D751 Secondary polycythemia: Secondary | ICD-10-CM | POA: Diagnosis not present

## 2022-09-08 DIAGNOSIS — C50212 Malignant neoplasm of upper-inner quadrant of left female breast: Secondary | ICD-10-CM | POA: Diagnosis not present

## 2022-09-08 DIAGNOSIS — Z170 Estrogen receptor positive status [ER+]: Secondary | ICD-10-CM

## 2022-09-08 DIAGNOSIS — D509 Iron deficiency anemia, unspecified: Secondary | ICD-10-CM | POA: Diagnosis not present

## 2022-09-08 DIAGNOSIS — Z79811 Long term (current) use of aromatase inhibitors: Secondary | ICD-10-CM | POA: Insufficient documentation

## 2022-09-08 DIAGNOSIS — F17210 Nicotine dependence, cigarettes, uncomplicated: Secondary | ICD-10-CM | POA: Insufficient documentation

## 2022-09-08 DIAGNOSIS — E2839 Other primary ovarian failure: Secondary | ICD-10-CM

## 2022-09-08 DIAGNOSIS — M85852 Other specified disorders of bone density and structure, left thigh: Secondary | ICD-10-CM | POA: Insufficient documentation

## 2022-09-08 DIAGNOSIS — D696 Thrombocytopenia, unspecified: Secondary | ICD-10-CM | POA: Diagnosis not present

## 2022-09-08 DIAGNOSIS — C50912 Malignant neoplasm of unspecified site of left female breast: Secondary | ICD-10-CM | POA: Insufficient documentation

## 2022-09-08 LAB — CBC WITH DIFFERENTIAL (CANCER CENTER ONLY)
Abs Immature Granulocytes: 0.01 10*3/uL (ref 0.00–0.07)
Basophils Absolute: 0 10*3/uL (ref 0.0–0.1)
Basophils Relative: 1 %
Eosinophils Absolute: 0.1 10*3/uL (ref 0.0–0.5)
Eosinophils Relative: 2 %
HCT: 47.3 % — ABNORMAL HIGH (ref 36.0–46.0)
Hemoglobin: 15.1 g/dL — ABNORMAL HIGH (ref 12.0–15.0)
Immature Granulocytes: 0 %
Lymphocytes Relative: 28 %
Lymphs Abs: 1.6 10*3/uL (ref 0.7–4.0)
MCH: 23.7 pg — ABNORMAL LOW (ref 26.0–34.0)
MCHC: 31.9 g/dL (ref 30.0–36.0)
MCV: 74.4 fL — ABNORMAL LOW (ref 80.0–100.0)
Monocytes Absolute: 0.3 10*3/uL (ref 0.1–1.0)
Monocytes Relative: 5 %
Neutro Abs: 3.6 10*3/uL (ref 1.7–7.7)
Neutrophils Relative %: 64 %
Platelet Count: 161 10*3/uL (ref 150–400)
RBC: 6.36 MIL/uL — ABNORMAL HIGH (ref 3.87–5.11)
RDW: 16.8 % — ABNORMAL HIGH (ref 11.5–15.5)
WBC Count: 5.6 10*3/uL (ref 4.0–10.5)
nRBC: 0 % (ref 0.0–0.2)

## 2022-09-08 LAB — CMP (CANCER CENTER ONLY)
ALT: 11 U/L (ref 0–44)
AST: 15 U/L (ref 15–41)
Albumin: 4.5 g/dL (ref 3.5–5.0)
Alkaline Phosphatase: 57 U/L (ref 38–126)
Anion gap: 5 (ref 5–15)
BUN: 14 mg/dL (ref 8–23)
CO2: 30 mmol/L (ref 22–32)
Calcium: 10.1 mg/dL (ref 8.9–10.3)
Chloride: 102 mmol/L (ref 98–111)
Creatinine: 0.57 mg/dL (ref 0.44–1.00)
GFR, Estimated: >60 mL/min (ref >60)
Glucose, Bld: 130 mg/dL — ABNORMAL HIGH (ref 70–99)
Potassium: 4.3 mmol/L (ref 3.5–5.1)
Sodium: 137 mmol/L (ref 135–145)
Total Bilirubin: 0.4 mg/dL (ref 0.3–1.2)
Total Protein: 7.7 g/dL (ref 6.5–8.1)

## 2022-09-08 LAB — SAMPLE TO BLOOD BANK

## 2022-09-08 LAB — FERRITIN: Ferritin: 10 ng/mL — ABNORMAL LOW (ref 11–307)

## 2022-09-08 MED ORDER — ANASTROZOLE 1 MG PO TABS: 1.0000 mg | ORAL_TABLET | Freq: Every day | ORAL | 1 refills | Status: DC

## 2022-09-08 NOTE — Telephone Encounter (Signed)
LVM to return my call about moving up pts LN BX to either dec 13th at [facility] or dec  14th @ [facility].

## 2022-09-08 NOTE — Telephone Encounter (Signed)
Called to inform the doctor that she is working to move up the appt. For patient to have a core biopsy.  If there are any questions, please call at [phone]

## 2022-09-09 DIAGNOSIS — E7800 Pure hypercholesterolemia, unspecified: Secondary | ICD-10-CM | POA: Diagnosis not present

## 2022-09-09 DIAGNOSIS — E1165 Type 2 diabetes mellitus with hyperglycemia: Secondary | ICD-10-CM | POA: Diagnosis not present

## 2022-09-09 DIAGNOSIS — I10 Essential (primary) hypertension: Secondary | ICD-10-CM | POA: Diagnosis not present

## 2022-09-12 DIAGNOSIS — J449 Chronic obstructive pulmonary disease, unspecified: Secondary | ICD-10-CM | POA: Diagnosis not present

## 2022-09-13 ENCOUNTER — Other Ambulatory Visit: Payer: Self-pay | Admitting: Student

## 2022-09-13 DIAGNOSIS — R9389 Abnormal findings on diagnostic imaging of other specified body structures: Secondary | ICD-10-CM

## 2022-09-14 ENCOUNTER — Other Ambulatory Visit: Payer: Self-pay

## 2022-09-14 ENCOUNTER — Ambulatory Visit (HOSPITAL_COMMUNITY)
Admission: RE | Admit: 2022-09-14 | Discharge: 2022-09-14 | Disposition: A | Discharge status: Home / Self Care | Payer: Medicare Other | Source: Ambulatory Visit | Attending: Emergency Medicine | Admitting: Emergency Medicine

## 2022-09-14 ENCOUNTER — Encounter (HOSPITAL_COMMUNITY): Payer: Self-pay

## 2022-09-14 DIAGNOSIS — R590 Localized enlarged lymph nodes: Secondary | ICD-10-CM | POA: Diagnosis not present

## 2022-09-14 DIAGNOSIS — R918 Other nonspecific abnormal finding of lung field: Secondary | ICD-10-CM | POA: Diagnosis not present

## 2022-09-14 DIAGNOSIS — R911 Solitary pulmonary nodule: Secondary | ICD-10-CM | POA: Insufficient documentation

## 2022-09-14 DIAGNOSIS — R9389 Abnormal findings on diagnostic imaging of other specified body structures: Secondary | ICD-10-CM | POA: Diagnosis not present

## 2022-09-14 DIAGNOSIS — J449 Chronic obstructive pulmonary disease, unspecified: Secondary | ICD-10-CM | POA: Diagnosis not present

## 2022-09-14 LAB — GLUCOSE, CAPILLARY: Glucose-Capillary: 130 mg/dL — ABNORMAL HIGH (ref 70–99)

## 2022-09-14 MED ORDER — SODIUM CHLORIDE 0.9 % IV SOLN: INTRAVENOUS | Status: DC

## 2022-09-14 MED ORDER — LIDOCAINE HCL (PF) 1 % IJ SOLN: 8.0000 mL | Freq: Once | INTRAMUSCULAR | Status: DC

## 2022-09-14 MED ORDER — LIDOCAINE HCL (PF) 1 % IJ SOLN
INTRAMUSCULAR | Status: AC
  Administered 2022-09-14: 10 mL via INTRADERMAL
  Filled 2022-09-14: qty 30

## 2022-09-14 NOTE — H&P (Signed)
Chief Complaint: Patient was seen in consultation today for left supra clavicular lymph node biopsy at the request of Byrum,Robert S  Referring Physician(s): Byrum,Robert S Dr Ky Barban  Supervising Physician: Markus Daft  Patient Status: [facility] - Out-pt  History of Present Illness: Monica [name] is a 76 y.o. female   Hx Breast Cancer 2018 Follows with Dr Burr Medico Follow up [place] and PET: 08/05/22 IMPRESSION: 1. Left hilar and suprahilar mass is markedly hypermetabolic consistent with primary bronchogenic neoplasm. Hypermetabolic disease extends into the AP window and inferior left hilum. 2. Hypermetabolic prevascular and left supraclavicular lymphadenopathy consistent with metastatic disease. 3. Small focus of hypermetabolism in the posterior left upper lobe adjacent to the fissure without definite underlying pulmonary nodule on CT imaging. Pulmonary metastasis not excluded. 4. No evid  Dr Lamonte Sakai note 09/01/22: Abnormal CT of the chest She has a left hilar mass that would be accessible by EBUS, is hypermetabolic on PET scan.  She also has a hypermetabolic left supraclavicular node that should be accessible for biopsy.  I discussed with her today the pros and cons of EBUS versus needle biopsy.  Ultimately recommended ultrasound-guided needle biopsy of the supraclavicular node in order to avoid the risk associated with general anesthesia and bronchoscopy.  If the node is not accessible then we will arrange for EBUS as soon as possible.  She has been followed by Dr. Burr Medico for her breast cancer.  When I have a tissue diagnosis then will coordinate results with her to plan next steps in therapy.   Scheduled now for biopsy of left SCLN in IR   Past Medical History:  Diagnosis Date   CAD (coronary artery disease)    Cypher stent 09/2002   Cancer ([place]) 2018   left breast, radiation therapy   CHF (congestive heart failure) ([place])    Depression    Diabetes mellitus ([place])    Hyperlipidemia     Hypertension    Myocardial infarction ([place])    2003,went into cardiac shock   Osteoporosis    Seizures ([place])    1st seizure age late 59's; most recent 07/28/17   Sinusitis     Past Surgical History:  Procedure Laterality Date   ABDOMINAL HYSTERECTOMY     APPENDECTOMY     BREAST LUMPECTOMY WITH RADIOACTIVE SEED AND SENTINEL LYMPH NODE BIOPSY Left 03/16/2017   Procedure: INJECT BLUE DYE LEFT BREAST, LEFT BREAST LUMPECTOMY WITH RADIOACTIVE SEED AND LEFT AXILLARY DEEP SENTINEL LYMPH NODE  BIOPSY ADJACENT TISSUE TRANSFER;  Surgeon: [name], MD;  Location: [place];  Service: General;  Laterality: Left;   CARPAL TUNNEL RELEASE Bilateral    COLONOSCOPY WITH PROPOFOL N/A 05/30/2015   Procedure: COLONOSCOPY WITH PROPOFOL;  Surgeon: Carol Ada, MD;  Location: [facility];  Service: Endoscopy;  Laterality: N/A;   COLONOSCOPY WITH PROPOFOL N/A 06/09/2018   Procedure: COLONOSCOPY WITH PROPOFOL;  Surgeon: Carol Ada, MD;  Location: [facility];  Service: Endoscopy;  Laterality: N/A;   COLONOSCOPY WITH PROPOFOL N/A 07/31/2021   Procedure: COLONOSCOPY WITH PROPOFOL;  Surgeon: Carol Ada, MD;  Location: [facility];  Service: Endoscopy;  Laterality: N/A;   CORONARY ANGIOPLASTY WITH STENT PLACEMENT  10-02-2002   ESOPHAGOGASTRODUODENOSCOPY (EGD) WITH PROPOFOL N/A 07/31/2021   Procedure: ESOPHAGOGASTRODUODENOSCOPY (EGD) WITH PROPOFOL;  Surgeon: Carol Ada, MD;  Location: [facility];  Service: Endoscopy;  Laterality: N/A;   Exploratory abdominal     Bleeding in abdomen after tubal   POLYPECTOMY  06/09/2018   Procedure: POLYPECTOMY;  Surgeon: Carol Ada,  MD;  Location: [facility];  Service: Endoscopy;;   POLYPECTOMY  07/31/2021   Procedure: POLYPECTOMY;  Surgeon: Carol Ada, MD;  Location: [facility];  Service: Endoscopy;;   TUBAL LIGATION     UMBILICAL HERNIA REPAIR      Allergies: Ace inhibitors, Keflex [cephalexin], Phenobarbital, Sulfate, Altace [ramipril], and  Dicyclomine hcl  Medications: Prior to Admission medications   Medication Sig Start Date End Date Taking? Authorizing Provider  acetaminophen (TYLENOL) 500 MG tablet Take 1,000 mg by mouth every 6 (six) hours as needed for moderate pain or headache.   Yes [provider]  albuterol (VENTOLIN HFA) 108 (90 Base) MCG/ACT inhaler INHALE 2 PUFFS BY MOUTH EVERY 6 HOURS AS NEEDED FOR WHEEZING OR  SHORTHNESS  OF  BREATH 07/14/21  Yes Young, Clinton D, MD  anastrozole (ARIMIDEX) 1 MG tablet Take 1 tablet (1 mg total) by mouth daily. 09/08/22  Yes Truitt Merle, MD  aspirin EC 81 MG tablet Take 81 mg by mouth at bedtime.   Yes [provider]  atorvastatin (LIPITOR) 40 MG tablet Take 40 mg by mouth at bedtime. 12/07/16  Yes [provider]  Calcium Carbonate-Vitamin D (CALTRATE 600+D PO) Take 1 tablet by mouth in the morning and at bedtime.   Yes [provider]  carvedilol (COREG) 3.125 MG tablet Take 3.125 mg by mouth 2 (two) times daily.    Yes [provider]  cetirizine (ZYRTEC) 10 MG tablet Take 10 mg by mouth daily as needed for allergies.   Yes [provider]  clonazePAM (KLONOPIN) 1 MG tablet Take 1 mg by mouth in the morning and at bedtime. 03/02/16  Yes [provider]  Fluticasone-Umeclidin-Vilant (TRELEGY ELLIPTA) 100-62.5-25 MCG/ACT AEPB INHALE 1 PUFF ONCE DAILY RINSE  MOUTH  AFTER 03/26/22  Yes Young, Tarri Fuller D, MD  Insulin Detemir (LEVEMIR) 100 UNIT/ML Pen Inject 17-25 Units into the skin See admin instructions. Inject 25 units subcutaneously in the morning and inject 17 units subcutaneously at night   Yes [provider]  insulin lispro (HUMALOG) 100 UNIT/ML KwikPen Inject 7-9 Units into the skin See admin instructions. Inject 7 units in the morning, 7 units at lunch, and 9 units at night 06/18/21  Yes [provider]  levETIRAcetam (KEPPRA) 500 MG tablet Take 1 tablet (500 mg total) by mouth 2 (two) times daily. 02/11/22   Yes Lomax, Amy, NP  losartan (COZAAR) 50 MG tablet Take 25 mg by mouth at bedtime.   Yes [provider]  nitroGLYCERIN (NITROSTAT) 0.4 MG SL tablet Place 1 tablet (0.4 mg total) under the tongue every 5 (five) minutes as needed for chest pain. 04/10/21 09/13/22 Yes Minus Breeding, MD  Omega-3 Fatty Acids (FISH OIL) 1000 MG CAPS Take 1,000 mg by mouth in the morning and at bedtime.   Yes [provider]  omeprazole (PRILOSEC) 20 MG capsule Take 20 mg by mouth in the morning.   Yes [provider]  pioglitazone (ACTOS) 15 MG tablet Take 15 mg by mouth at bedtime. 06/18/21  Yes [provider]  Polyethyl Glycol-Propyl Glycol 0.4-0.3 % SOLN Place 1 drop into both eyes 2 (two) times daily as needed (itchy/irritated eyes.).   Yes [provider]  sertraline (ZOLOFT) 100 MG tablet Take 100 mg by mouth at bedtime. 02/02/17  Yes [provider]  TRIJARDY XR 12.5-2.02-999 MG TB24 Take 1 tablet by mouth 2 (two) times daily. 02/17/20  Yes [provider]  B-D ULTRAFINE III SHORT PEN 31G X  8 MM MISC Inject into the skin as directed. 03/16/20   [provider]  ketorolac (ACULAR) 0.5 % ophthalmic solution Place 1 drop into the left eye 4 (four) times daily. 06/03/22   [provider]  moxifloxacin (VIGAMOX) 0.5 % ophthalmic solution Place 1 drop into the left eye 4 (four) times daily. 06/03/22   [provider]  ONETOUCH VERIO test strip USE THREE TIMES A DAY AND AS NEEDED FOR HYPOGLYCEMIA 03/12/20   [provider]  OXYGEN Inhale 2 L into the lungs at bedtime.    [provider]  prednisoLONE acetate (PRED FORTE) 1 % ophthalmic suspension Place 1 drop into the left eye 4 (four) times daily. 06/03/22   [provider]  risedronate (ACTONEL) 150 MG tablet Take 150 mg by mouth every 30 (thirty) days. 03/02/16   [provider]     Family History  Problem Relation Age of Onset   Dementia Mother     Kidney disease Mother    Heart attack Father 90       Died age 20   Heart attack Brother 83   Heart attack Brother 45    Social History   Socioeconomic History   Marital status: Married    Spouse name: Sonia Side   Number of children: 3   Years of education: 12   Highest education level: Not on file  Occupational History   Not on file  Tobacco Use   Smoking status: Former    Packs/day: 2.00    Years: 30.00    Total pack years: 60.00    Types: Cigarettes    Quit date: 12/28/2020    Years since quitting: 1.7   Smokeless tobacco: Never   Tobacco comments:    quit mid march 2022  Vaping Use   Vaping Use: Never used  Substance and Sexual Activity   Alcohol use: No    Alcohol/week: 0.0 standard drinks of alcohol   Drug use: No   Sexual activity: Not on file  Other Topics Concern   Not on file  Social History Narrative   Lives with husband.    Caffeine-    Social Determinants of Health   Financial Resource Strain: Not on file  Food Insecurity: Not on file  Transportation Needs: Not on file  Physical Activity: Not on file  Stress: Not on file  Social Connections: Not on file    Review of Systems: A 12 point ROS discussed and pertinent positives are indicated in the HPI above.  All other systems are negative.  Review of Systems  Constitutional:  Negative for activity change, fatigue and fever.  HENT:  Negative for facial swelling and sore throat.   Respiratory:  Negative for cough and shortness of breath.   Gastrointestinal:  Negative for abdominal pain.  Neurological:  Negative for weakness.  Psychiatric/Behavioral:  Negative for behavioral problems and confusion.     Vital Signs: BP 107/60   Pulse (!) 59   Temp (!) 97.1 F (36.2 C)   Resp 19   Ht 5\' 5"  (1.651 m)   Wt 105 lb (47.6 kg)   SpO2 93%   BMI 17.47 kg/m    Physical Exam Vitals reviewed.  HENT:     Mouth/Throat:     Mouth: Mucous membranes are moist.  Cardiovascular:     Rate and Rhythm:  Normal rate and regular rhythm.     Heart sounds: Normal heart sounds.  Pulmonary:     Effort: Pulmonary effort is  normal.     Breath sounds: Normal breath sounds.  Abdominal:     Palpations: Abdomen is soft.  Musculoskeletal:        General: Normal range of motion.  Skin:    General: Skin is warm.  Neurological:     Mental Status: She is alert and oriented to person, place, and time.  Psychiatric:        Behavior: Behavior normal.     Imaging: No results found.  Labs:  CBC: Recent Labs    12/01/21 1110 03/03/22 1211 09/08/22 1048  WBC 6.3 6.1 5.6  HGB 16.7* 16.4* 15.1*  HCT 51.1* 50.8* 47.3*  PLT 135* 145* 161    COAGS: No results for input(s): "INR", "APTT" in the last 8760 hours.  BMP: Recent Labs    12/01/21 1110 03/03/22 1211 09/08/22 1048  NA 138 139 137  K 4.7 4.3 4.3  CL 102 104 102  CO2 30 28 30   GLUCOSE 97 156* 130*  BUN 8 9 14   CALCIUM 9.9 9.6 10.1  CREATININE 0.58 0.61 0.57  GFRNONAA >60 >60 >60    LIVER FUNCTION TESTS: Recent Labs    12/01/21 1110 03/03/22 1211 09/08/22 1048  BILITOT 0.6 0.4 0.4  AST 19 16 15   ALT 15 13 11   ALKPHOS 55 73 57  PROT 7.4 7.5 7.7  ALBUMIN 4.5 4.5 4.5    TUMOR MARKERS: No results for input(s): "AFPTM", "CEA", "CA199", "CHROMGRNA" in the last 8760 hours.  Assessment and Plan:  Hx Breast Cancer 2018 Follow up scans reveal Lung nodule and Lymphadenopathy Left supraclavicular lymph node + PET Scheduled now for biopsy of same Risks and benefits of left supra clavicular lymph node biopsy was discussed with the patient and/or patient's family including, but not limited to bleeding, infection, damage to adjacent structures or low yield requiring additional tests.  All of the questions were answered and there is agreement to proceed.  Consent signed and in chart.   Thank you for this interesting consult.  I greatly enjoyed meeting Monica [name] and look forward to participating in their care.  A  copy of this report was sent to the requesting provider on this date.  Electronically Signed: Lavonia Drafts, PA-C 09/14/2022, 8:50 AM   I spent a total of  30 Minutes   in face to face in clinical consultation, greater than 50% of which was counseling/coordinating care for L SCLN bx

## 2022-09-14 NOTE — Progress Notes (Unsigned)
Monica [name]   [phone] Fax:([phone]) [phone]   Clinic Follow up Note   Patient Care Team: Merrilee Seashore, MD as PCP - General (Internal Medicine) Minus Breeding, MD as PCP - Cardiology (Cardiology) [name], MD as Consulting Physician (General Surgery) Truitt Merle, MD as Consulting Physician (Hematology) Kyung Rudd, MD as Consulting Physician (Radiation Oncology) Gardenia Phlegm, NP as Nurse Practitioner (Hematology and Oncology)  Date of Service:  09/16/2022  CHIEF COMPLAINT: discuss lymph node biopsy results   CURRENT THERAPY:  Anastrozole 1 mg once daily starting 06/2017    ASSESSMENT:  Monica [name] is a 76 y.o. female with   Small cell lung cancer ([place]), [id number], stage III, limited stage  -Screening CT chest on July 22, 2022 showed suprahilar left upper lobe pulmonary mass and mediastinal adenopathy -PET scan from August 08, 2022 showed hypermetabolic left hilar lung mass with mediastinal and left supraclavicular nodal metastasis. 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 I discussed the biopsy results with patient and her daughter in detail. -Will obtain brain MRI with and without contrast to rule out brain metastasis. -I discussed and prognosis and treatment options.  This is limited stage small cell lung cancer, stage III with N3 disease, so the risk of recurrence is very high. I recommend concurrent chemo and radiation with cisplatin and etoposide every 3 weeks for 4 cycles.  He is 76 year old, but overall has good PS, is a candidate for chemo.  --Chemotherapy consent: Side effects including but does not not limited to, fatigue, nausea, vomiting, diarrhea, hair loss, hearing loss and neuropathy, fluid retention, renal and kidney dysfunction, neutropenic fever, needed for blood transfusion, bleeding, were discussed with patient in great detail. She agrees to proceed. -the  goal of chemo is curative.  -I made urgent referral to radiation oncology today, Dr. Tammi Klippel will see her tomorrow.  2. 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 Tolerating well.  -on surveillance, mammograms annually in May at [facility], I have requested the report -physical exam was unremarkable, no clinical evidence of recurrence. Continue surveillance and anastrozole    PLAN: -Discuss the biopsy, which showed Small Cell Lung cancer -I recommend concurrent Chemo and Radiation, plan to start 12/20 due to her trip next week and cataract surgery on December 18 -I will order brain MRI w wo contrast  -Chemo Education, hearing test, port placement -urgent referral to rad/onc, she will see Dr. Tammi Klippel tomorrow      SUMMARY OF ONCOLOGIC HISTORY: Oncology History Overview Note  Cancer Staging Malignant neoplasm of upper-inner quadrant of left breast in female, estrogen receptor positive ([place]) Staging form: Breast, AJCC 8th Edition - Clinical stage from 02/16/2017: Stage IA (cT1b, cN0, cM0, G2, ER: Positive, PR: Positive, HER2: Negative) - Unsigned Staging comments: Staged at breast conference  - Pathologic stage from 03/16/2017: Stage IA (pT1b, pN0, cM0, G2, ER: Positive, PR: Positive, HER2: Negative, Oncotype DX score: 27) - Signed by Truitt Merle, MD on 04/24/2017     Malignant neoplasm of upper-inner quadrant of left breast in female, estrogen receptor positive ([place])  02/09/2017 Initial Diagnosis   Malignant neoplasm of upper-inner quadrant of left breast in female, estrogen receptor positive ([place])   02/09/2017 Initial Biopsy   Diagnosis Breast, left, needle core biopsy -  INVASIVE DUCTAL CARCINOMA, G1-2 - DUCTAL CARCINOMA IN SITU   02/09/2017  Receptors her2   Estrogen Receptor: 100%, POSITIVE, STRONG STAINING INTENSITY Progesterone Receptor: 15%, POSITIVE, STRONG STAINING INTENSITY Proliferation Marker Ki67: 15% HER2 (-)   03/16/2017 Surgery   LEFT BREAST LUMPECTOMY WITH RADIOACTIVE SEED AND LEFT AXILLARY DEEP SENTINEL LYMPH NODE  BIOPSY ADJACENT TISSUE TRANSFER by Dr. Dalbert Batman    03/16/2017 Pathology Results   PATHOLOGY REPORT Diagnosis 03/16/17 1. Breast, lumpectomy, Left - INVASIVE DUCTAL CARCINOMA, NOTTINGHAM GRADE 2 OF 3, 0.9 CM - DUCTAL CARCINOMA IN SITU - MARGINS UNINVOLVED BY CARCINOMA (0.3 CM POSTERIOR MARGIN) - PREVIOUS BIOPSY SITE CHANGES - SEE ONCOLOGY TABLE BELOW 2. Lymph node, sentinel, biopsy, Left axillary #1 - NO CARCINOMA IDENTIFIED IN ONE LYMPH NODE (0/1) 3. Lymph node, sentinel, biopsy, Left axillary #2 - NO CARCINOMA IDENTIFIED IN ONE LYMPH NODE (0/1) 4. Lymph node, sentinel, biopsy, Left axillary #3 - NO CARCINOMA IDENTIFIED IN ONE LYMPH NODE (0/1) 5. Lymph node, sentinel, biopsy, Left axillary #4 - NO CARCINOMA IDENTIFIED IN ONE LYMPH NODE (0/1)    03/16/2017 Oncotype testing   Recurrance score of 27 with a 18% distance recurrance in the net 10 years with Tamoxifen alone    05/19/2017 - 06/16/2017 Radiation Therapy   Radiation with Dr. Lisbeth Renshaw   06/2017 -  Anti-estrogen oral therapy   anastrozole 1 mg once a day starting late 06/2017     04/29/2020 Pathology Results   DIAGNOSIS:   BONE MARROW, ASPIRATE, CLOT, CORE:  -  Cellular marrow with trilineage hematopoiesis  -  No morphologic evidence of carcinoma or hematopoietic neoplasm  -  See microscopic description below   PERIPHERAL BLOOD:  -  Polycythemia and thrombocytopenia  -  See complete blood cell count   MICROSCOPIC DESCRIPTION:   PERIPHERAL BLOOD SMEAR: The peripheral blood has a polycythemia and  thrombocytopenia.  Leukocytes are morphologically unremarkable.   BONE MARROW ASPIRATE: Spicular, cellular and adequate for evaluation   Erythroid precursors: Orderly maturation without overt dysplasia  Granulocytic precursors: Orderly maturation without overt dysplasia  Megakaryocytes: Qualitatively and quantitatively unremarkable  Lymphocytes/plasma cells: No lymphocytosis or plasmacytosis    Small cell lung cancer ([place])  09/14/2022 Cancer Staging   Staging form: Lung, AJCC 8th Edition - Clinical stage from 09/14/2022: Stage IIIB (cT2, cN3, cM0) - Signed by Truitt Merle, MD on 09/16/2022   09/16/2022 Initial Diagnosis   Small cell lung cancer ([place])   09/28/2022 -  Chemotherapy   Patient is on Treatment Plan : LUNG SMALL CELL Cisplatin (80) D1 + Etoposide (100) D1-3 q21d        INTERVAL HISTORY:  LADASHA SCHNACKENBERG is here for a follow up of left breast cancer, polycythemia, and iron deficiency . She was last seen by me on 09/08/2022 She presents to the clinic accompanied by daughter. Pt report a little bruise from the biopsy. Still feeling fatigue, But overall doing well.Pt is schedule for a  family trip.    All other systems were reviewed with the patient and are negative.  MEDICAL HISTORY:  Past Medical History:  Diagnosis Date   CAD (coronary artery disease)    Cypher stent 09/2002   Cancer ([place]) 2018   left breast, radiation therapy   CHF (congestive heart failure) ([place])    Depression    Diabetes mellitus ([place])    Hyperlipidemia    Hypertension    Myocardial infarction ([place])    2003,went into cardiac shock   Osteoporosis    Seizures ([place])  1st seizure age late 42's; most recent 07/28/17   Sinusitis     SURGICAL HISTORY: Past Surgical History:  Procedure Laterality Date   ABDOMINAL HYSTERECTOMY     APPENDECTOMY     BREAST LUMPECTOMY WITH RADIOACTIVE SEED AND SENTINEL LYMPH NODE BIOPSY Left 03/16/2017   Procedure: INJECT BLUE DYE LEFT BREAST, LEFT BREAST LUMPECTOMY WITH RADIOACTIVE SEED AND LEFT AXILLARY DEEP SENTINEL LYMPH NODE  BIOPSY ADJACENT TISSUE TRANSFER;  Surgeon: [name], MD;  Location:  [facility];  Service: General;  Laterality: Left;   CARPAL TUNNEL RELEASE Bilateral    COLONOSCOPY WITH PROPOFOL N/A 05/30/2015   Procedure: COLONOSCOPY WITH PROPOFOL;  Surgeon: Carol Ada, MD;  Location: [facility];  Service: Endoscopy;  Laterality: N/A;   COLONOSCOPY WITH PROPOFOL N/A 06/09/2018   Procedure: COLONOSCOPY WITH PROPOFOL;  Surgeon: Carol Ada, MD;  Location: [facility];  Service: Endoscopy;  Laterality: N/A;   COLONOSCOPY WITH PROPOFOL N/A 07/31/2021   Procedure: COLONOSCOPY WITH PROPOFOL;  Surgeon: Carol Ada, MD;  Location: [facility];  Service: Endoscopy;  Laterality: N/A;   CORONARY ANGIOPLASTY WITH STENT PLACEMENT  10-02-2002   ESOPHAGOGASTRODUODENOSCOPY (EGD) WITH PROPOFOL N/A 07/31/2021   Procedure: ESOPHAGOGASTRODUODENOSCOPY (EGD) WITH PROPOFOL;  Surgeon: Carol Ada, MD;  Location: [facility];  Service: Endoscopy;  Laterality: N/A;   Exploratory abdominal     Bleeding in abdomen after tubal   POLYPECTOMY  06/09/2018   Procedure: POLYPECTOMY;  Surgeon: Carol Ada, MD;  Location: [facility];  Service: Endoscopy;;   POLYPECTOMY  07/31/2021   Procedure: POLYPECTOMY;  Surgeon: Carol Ada, MD;  Location: [facility];  Service: Endoscopy;;   TUBAL LIGATION     UMBILICAL HERNIA REPAIR      I have reviewed the social history and family history with the patient and they are unchanged from previous note.  ALLERGIES:  is allergic to ace inhibitors, keflex [cephalexin], phenobarbital, sulfate, altace [ramipril], and dicyclomine hcl.  MEDICATIONS:  Current Outpatient Medications  Medication Sig Dispense Refill   acetaminophen (TYLENOL) 500 MG tablet Take 1,000 mg by mouth every 6 (six) hours as needed for moderate pain or headache.     albuterol (VENTOLIN HFA) 108 (90 Base) MCG/ACT inhaler INHALE 2 PUFFS BY MOUTH EVERY 6 HOURS AS NEEDED FOR WHEEZING OR  SHORTHNESS  OF  BREATH 9 g 11   anastrozole (ARIMIDEX) 1 MG tablet Take 1 tablet (1 mg total) by mouth  daily. 90 tablet 1   aspirin EC 81 MG tablet Take 81 mg by mouth at bedtime.     atorvastatin (LIPITOR) 40 MG tablet Take 40 mg by mouth at bedtime.     B-D ULTRAFINE III SHORT PEN 31G X 8 MM MISC Inject into the skin as directed.     Calcium Carbonate-Vitamin D (CALTRATE 600+D PO) Take 1 tablet by mouth in the morning and at bedtime.     carvedilol (COREG) 3.125 MG tablet Take 3.125 mg by mouth 2 (two) times daily.      cetirizine (ZYRTEC) 10 MG tablet Take 10 mg by mouth daily as needed for allergies.     clonazePAM (KLONOPIN) 1 MG tablet Take 1 mg by mouth in the morning and at bedtime.     Fluticasone-Umeclidin-Vilant (TRELEGY ELLIPTA) 100-62.5-25 MCG/ACT AEPB INHALE 1 PUFF ONCE DAILY RINSE  MOUTH  AFTER 60 each 11   Insulin Detemir (LEVEMIR) 100 UNIT/ML Pen Inject 17-25 Units into the skin See admin instructions. Inject 25 units subcutaneously in the morning and inject 17 units subcutaneously at night  insulin lispro (HUMALOG) 100 UNIT/ML KwikPen Inject 7-9 Units into the skin See admin instructions. Inject 7 units in the morning, 7 units at lunch, and 9 units at night     ketorolac (ACULAR) 0.5 % ophthalmic solution Place 1 drop into the left eye 4 (four) times daily.     levETIRAcetam (KEPPRA) 500 MG tablet Take 1 tablet (500 mg total) by mouth 2 (two) times daily. 180 tablet 3   losartan (COZAAR) 50 MG tablet Take 25 mg by mouth at bedtime.     moxifloxacin (VIGAMOX) 0.5 % ophthalmic solution Place 1 drop into the left eye 4 (four) times daily.     nitroGLYCERIN (NITROSTAT) 0.4 MG SL tablet Place 1 tablet (0.4 mg total) under the tongue every 5 (five) minutes as needed for chest pain. 25 tablet 2   Omega-3 Fatty Acids (FISH OIL) 1000 MG CAPS Take 1,000 mg by mouth in the morning and at bedtime.     omeprazole (PRILOSEC) 20 MG capsule Take 20 mg by mouth in the morning.     ONETOUCH VERIO test strip USE THREE TIMES A DAY AND AS NEEDED FOR HYPOGLYCEMIA     OXYGEN Inhale 2 L into the lungs  at bedtime.     pioglitazone (ACTOS) 15 MG tablet Take 15 mg by mouth at bedtime.     Polyethyl Glycol-Propyl Glycol 0.4-0.3 % SOLN Place 1 drop into both eyes 2 (two) times daily as needed (itchy/irritated eyes.).     prednisoLONE acetate (PRED FORTE) 1 % ophthalmic suspension Place 1 drop into the left eye 4 (four) times daily.     risedronate (ACTONEL) 150 MG tablet Take 150 mg by mouth every 30 (thirty) days.     sertraline (ZOLOFT) 100 MG tablet Take 100 mg by mouth at bedtime.     TRIJARDY XR 12.5-2.02-999 MG TB24 Take 1 tablet by mouth 2 (two) times daily.     No current facility-administered medications for this visit.    PHYSICAL EXAMINATION: ECOG PERFORMANCE STATUS: 1 - Symptomatic but completely ambulatory  Vitals:   09/16/22 1153  BP: 110/69  Pulse: 68  Resp: 18  SpO2: 97%   Wt Readings from Last 3 Encounters:  09/16/22 103 lb 12.8 oz (47.1 kg)  09/14/22 105 lb (47.6 kg)  09/08/22 106 lb 3.2 oz (48.2 kg)     GENERAL:alert, no distress and comfortable SKIN: skin color normal, no rashes or significant lesions EYES: normal, Conjunctiva are pink and non-injected, sclera clear  NEURO: alert & oriented x 3 with fluent speech  LABORATORY DATA:  I have reviewed the data as listed    Latest Ref Rng & Units 09/08/2022   10:48 AM 03/03/2022   12:11 PM 12/01/2021   11:10 AM  CBC  WBC 4.0 - 10.5 K/uL 5.6  6.1  6.3   Hemoglobin 12.0 - 15.0 g/dL 15.1  16.4  16.7   Hematocrit 36.0 - 46.0 % 47.3  50.8  51.1   Platelets 150 - 400 K/uL 161  145  135         Latest Ref Rng & Units 09/08/2022   10:48 AM 03/03/2022   12:11 PM 12/01/2021   11:10 AM  CMP  Glucose 70 - 99 mg/dL 130  156  97   BUN 8 - 23 mg/dL _0 Creatinine 0.44 - 1.00 mg/dL 0.57  0.61  0.58   Sodium 135 - 145 mmol/L 137  139  138   Potassium 3.5 -  5.1 mmol/L 4.3  4.3  4.7   Chloride 98 - 111 mmol/L 102  104  102   CO2 22 - 32 mmol/L _0 Calcium 8.9 - 10.3 mg/dL 10.1  9.6  9.9   Total  Protein 6.5 - 8.1 g/dL 7.7  7.5  7.4   Total Bilirubin 0.3 - 1.2 mg/dL 0.4  0.4  0.6   Alkaline Phos 38 - 126 U/L 57  73  55   AST 15 - 41 U/L _1 ALT 0 - 44 U/L _2 RADIOGRAPHIC STUDIES: I have personally reviewed the radiological images as listed and agreed with the findings in the report. No results found.    Orders Placed This Encounter  Procedures   MR Brain W Wo Contrast    Standing Status:   Future    Standing Expiration Date:   09/16/2023    Order Specific Question:   If indicated for the ordered procedure, I authorize the administration of contrast media per Radiology protocol    Answer:   Yes    Order Specific Question:   What is the patient's sedation requirement?    Answer:   No Sedation    Order Specific Question:   Does the patient have a pacemaker or implanted devices?    Answer:   No    Order Specific Question:   Use SRS Protocol?    Answer:   No    Order Specific Question:   Preferred imaging location?    Answer:   [facility] (table limit - 550 lbs)   CBC with Differential ([place] Only)    Standing Status:   Future    Standing Expiration Date:   09/29/2023   CMP ([place] only)    Standing Status:   Future    Standing Expiration Date:   09/29/2023   Magnesium    Standing Status:   Future    Standing Expiration Date:   09/29/2023   CBC with Differential ([facility] Only)    Standing Status:   Future    Standing Expiration Date:   10/20/2023   CMP ([place] only)    Standing Status:   Future    Standing Expiration Date:   10/20/2023   Magnesium    Standing Status:   Future    Standing Expiration Date:   10/20/2023   All questions were answered. The patient knows to call the clinic with any problems, questions or concerns. No barriers to learning was detected. The total time spent in the appointment was 60 minutes.     Truitt Merle, MD 09/16/2022   Felicity Coyer, CMA, am acting as scribe for Truitt Merle,  MD.   I have reviewed the above documentation for accuracy and completeness, and I agree with the above.

## 2022-09-14 NOTE — Procedures (Signed)
Interventional Radiology Procedure:   Indications: Left lung mass and left supraclavicular lymphadenopathy  Procedure: US guided core biopsy of left supraclavicular lymph node.  Findings: Enlarged left supraclavicular nodes. 18 gauge core biopsies obtained.  Specimens placed in saline.   Complications: No immediate complications noted.     EBL: Minimal  Plan: Discharge to home.    Monica [name] R. Anselm Pancoast, MD  Pager: [phone]

## 2022-09-16 ENCOUNTER — Other Ambulatory Visit: Payer: Self-pay

## 2022-09-16 ENCOUNTER — Encounter: Payer: Self-pay | Admitting: Hematology

## 2022-09-16 ENCOUNTER — Telehealth: Payer: Self-pay | Admitting: Hematology

## 2022-09-16 ENCOUNTER — Inpatient Hospital Stay: Payer: Medicare Other | Attending: Hematology | Admitting: Hematology

## 2022-09-16 ENCOUNTER — Telehealth: Payer: Self-pay | Admitting: Radiation Oncology

## 2022-09-16 VITALS — BP 110/69 | HR 68 | Resp 18 | Ht 65.0 in | Wt 103.8 lb

## 2022-09-16 DIAGNOSIS — Z794 Long term (current) use of insulin: Secondary | ICD-10-CM | POA: Diagnosis not present

## 2022-09-16 DIAGNOSIS — Z170 Estrogen receptor positive status [ER+]: Secondary | ICD-10-CM | POA: Diagnosis not present

## 2022-09-16 DIAGNOSIS — D751 Secondary polycythemia: Secondary | ICD-10-CM | POA: Diagnosis not present

## 2022-09-16 DIAGNOSIS — C50212 Malignant neoplasm of upper-inner quadrant of left female breast: Secondary | ICD-10-CM | POA: Diagnosis not present

## 2022-09-16 DIAGNOSIS — Z510 Encounter for antineoplastic radiation therapy: Secondary | ICD-10-CM | POA: Diagnosis not present

## 2022-09-16 DIAGNOSIS — Z5111 Encounter for antineoplastic chemotherapy: Secondary | ICD-10-CM | POA: Diagnosis not present

## 2022-09-16 DIAGNOSIS — D696 Thrombocytopenia, unspecified: Secondary | ICD-10-CM | POA: Insufficient documentation

## 2022-09-16 DIAGNOSIS — C50912 Malignant neoplasm of unspecified site of left female breast: Secondary | ICD-10-CM | POA: Diagnosis not present

## 2022-09-16 DIAGNOSIS — C3412 Malignant neoplasm of upper lobe, left bronchus or lung: Secondary | ICD-10-CM | POA: Diagnosis not present

## 2022-09-16 DIAGNOSIS — E1165 Type 2 diabetes mellitus with hyperglycemia: Secondary | ICD-10-CM | POA: Diagnosis not present

## 2022-09-16 DIAGNOSIS — Z452 Encounter for adjustment and management of vascular access device: Secondary | ICD-10-CM | POA: Diagnosis not present

## 2022-09-16 DIAGNOSIS — C3490 Malignant neoplasm of unspecified part of unspecified bronchus or lung: Secondary | ICD-10-CM

## 2022-09-16 DIAGNOSIS — E1121 Type 2 diabetes mellitus with diabetic nephropathy: Secondary | ICD-10-CM | POA: Diagnosis not present

## 2022-09-16 DIAGNOSIS — Z79811 Long term (current) use of aromatase inhibitors: Secondary | ICD-10-CM | POA: Diagnosis not present

## 2022-09-16 LAB — SURGICAL PATHOLOGY

## 2022-09-16 NOTE — Progress Notes (Signed)
Thoracic Location of Tumor / Histology:  Malignant neoplasm of upper inner quadrant of left breast estrogen receptor positive ([place])  Additional Dx:  Small Cell Lung Cancer  07/22/2022 Dr. Elie Confer CT Chest Lung Ca Screen Low Dose w/o Contrast CLINICAL DATA:  76 year old female with 53 pack-year history of smoking. Lung cancer screening.  FINDINGS: Cardiovascular: The heart size is normal. No substantial pericardial effusion. Coronary artery calcification is evident. Moderate atherosclerotic calcification is noted in the wall of the thoracic aorta. Ascending thoracic aorta measures 4.3 cm diameter.   Mediastinum/Nodes: Interval development of pre-vascular and AP window lymphadenopathy. 2 cm short axis prevascular node is visible on image 15/series 2. 2.9 cm short axis avascular node extends into the AP window (see image 19/series 2). No evidence for gross hilar lymphadenopathy although assessment is limited by the lack of intravenous contrast on the current study. The esophagus has normal imaging features. There is no axillary lymphadenopathy.   Lungs/Pleura: Advanced changes of centrilobular and paraseptal emphysema again noted. Tiny bilateral pulmonary nodules identified previously are stable in the interval. No new suspicious pulmonary nodule or mass. No focal airspace consolidation. No pleural effusion.   Upper Abdomen: Stable cyst in the right liver and inferior aspect of the medial segment left liver (incompletely visualized). No worrisome lytic or sclerotic osseous abnormality.   Musculoskeletal:   IMPRESSION: 1. Lung-RADS 4B, suspicious. Apparent central suprahilar left upper lobe pulmonary mass encasing the left upper lobe airway, not well seen on this noncontrast study. Associated bulky AP window and prevascular lymphadenopathy in the mediastinum. Immediate follow-up CT chest with contrast recommended and consultation with Pulmonology or Thoracic Surgery also recommended. 2.  Interval development of pre-vascular and AP window lymphadenopathy, as above. Imaging features are concerning for metastatic disease although lymphoma lymphoma could have this appearance. 3. Ascending thoracic aorta measures 4.3 cm diameter. Attention on follow-up recommended. 4. Aortic Atherosclerosis (ICD10-I70.0) and Emphysema (ICD10-J43.9).  08/05/2022 Dr. Elie Confer NM PET Image Initial (PI) Skull Base to Thigh CLINICAL DATA:  Initial treatment strategy for pulmonary nodule.    FINDINGS: Mediastinal blood pool activity: SUV max 2.0   Liver activity: SUV max NA   NECK: 10 mm left supraclavicular node on 16/6 is hypermetabolic with SUV max = 9.1   Incidental CT findings: None.   CHEST: Left hilar and suprahilar mass is markedly hypermetabolic with SUV max = 9.8. Hypermetabolic disease extends into the AP window and inferior left hilum. 19 mm short axis prevascular node on 06/3 is hypermetabolic with SUV max = 9.4.   Small hypermetabolic focus posterior left upper lobe adjacent to the fissure (59/4) demonstrates SUV max = 2.4. No definite underlying pulmonary nodule on CT imaging.   No hypermetabolic paratracheal or subcarinal lymphadenopathy. No hypermetabolic lymphadenopathy in the right hilum.   Incidental CT findings: Coronary artery calcification is evident.  Moderate atherosclerotic calcification is noted in the wall of the thoracic aorta. Ascending thoracic aorta measures 4.2 cm diameter.  Centrilobular and paraseptal emphysema evident. Compressive atelectasis noted in the dependent lung bases.   ABDOMEN/PELVIS: No abnormal hypermetabolic activity within the liver, pancreas, adrenal glands, or spleen. No hypermetabolic lymph nodes in the abdomen or pelvis.   Incidental CT findings: 2.9 cm right hepatic lobe lesion shows no hypermetabolism, likely a cyst additional probable cysts are seen along the gallbladder fossa infrarenal abdominal aorta measures 3.9 cm diameter.   SKELETON:  No focal hypermetabolic activity to suggest skeletal metastasis.   Incidental CT findings: None.   IMPRESSION: 1. Left hilar and  suprahilar mass is markedly hypermetabolic consistent with primary bronchogenic neoplasm. Hypermetabolic disease extends into the AP window and inferior left hilum. 2. Hypermetabolic prevascular and left supraclavicular lymphadenopathy consistent with metastatic disease. 3. Small focus of hypermetabolism in the posterior left upper lobe adjacent to the fissure without definite underlying pulmonary nodule on CT imaging. Pulmonary metastasis not excluded. 4. No evidence for hypermetabolic metastatic disease in the abdomen or pelvis. 5. 4.2 cm ascending thoracic aortic diameter. Recommend annual imaging followup by CTA or MRA. This recommendation follows 2010  ACCF/AHA/AATS/ACR/ASA/SCA/SCAI/SIR/STS/SVM Guidelines for the Diagnosis and Management of Patients with Thoracic Aortic Disease. Circulation. 2010; 121: J121-K244. Aortic aneurysm NOS (ICD10-I71.9) 6. 3.9 cm infrarenal abdominal aortic aneurysm. Recommend follow-up ultrasound every 2 years. This recommendation follows ACR consensus guidelines: White Paper of the ACR Incidental Findings Committee II on Vascular Findings. J Am Coll Radiol 2013; 10:789-794. 7.  Emphysema (ICD10-J43.9). 8.  Aortic Atherosclerois (ICD10-170.0)    09/14/2022 Dr. Lamonte Sakai [place] CORE BIOPSY (Lymph Nodes) INDICATION: 76 year old with left suprahilar mass.  Tissue diagnosis is needed.   FINDINGS: Three enlarged left supraclavicular lymph nodes. Core biopsies were obtained from 1 of the enlarged left supraclavicular lymph nodes. No immediate bleeding or hematoma formation.   IMPRESSION: Ultrasound-guided core biopsy of a left supraclavicular lymph node.   Tobacco/Marijuana/Snuff/ETOH use: Quit smoking 12/28/2020, no smokeless tobacco, no drugs or alcohol use.  Past/Anticipated interventions by cardiothoracic surgery, if any:  Dr. Lamonte Sakai: [place]  core biopsy of lymph nodes.  Past/Anticipated interventions by medical oncology, if any:   Dr. 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 Tolerating well.  On surveillance, mammograms annually in May at [facility], I have requested the report physical exam was unremarkable, no clinical evidence of recurrence. Continue surveillance and anastrozole  CURRENT THERAPY:  Anastrozole 1 mg once daily starting 06/2017.  PLAN: -Discuss the biopsy  -Discuss Small Cell Lung cancer, Chemo and Radiation Cisplatin+ [place]topside -I will order MRI -Chemo Education   Signs/Symptoms Weight changes, if any:  Weight loss of 15 lbs. Respiratory complaints, if any:  SOB with exertion Hemoptysis, if any: No, sometimes clear mucus Pain issues, if any:  0/10  SAFETY ISSUES: Prior radiation? Radiation therapy with Dr. Lisbeth Renshaw 05/19/2017 to 06/16/2017. Pacemaker/ICD? No  Possible current pregnancy? Hysterectomy Is the patient on methotrexate? No  Current Complaints / other details:

## 2022-09-16 NOTE — Assessment & Plan Note (Signed)
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 Tolerating well.  -on surveillance, mammograms annually in May at [facility], I have requested the report -physical exam was unremarkable, no clinical evidence of recurrence. Continue surveillance and anastrozole

## 2022-09-16 NOTE — Telephone Encounter (Signed)
Per 12/7 IB

## 2022-09-16 NOTE — Telephone Encounter (Signed)
LVM on home # to schedule CON with Dr. Tammi Klippel. Unable to LVM on cell phone.

## 2022-09-16 NOTE — Progress Notes (Signed)
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    ICD-10-CM   1. Small cell lung cancer (HCC)  C34.90       HISTORY OF PRESENT ILLNESS: Monica [name] is a 76 y.o. female seen at the request of Dr. Burr Medico.  She is a longtime smoker and has been undergoing lung cancer screening with low-dose CT chest scans annually.  On her recent scan from 07/22/2022, she was noted to have a Lung-RADS 4B lesion in the central suprahilar LUL, encasing the LUL airway with bulky AP window and prevascular lymphadenopathy, not present on prior CT chest scan from 07/22/2021.  This was further evaluated with a PET scan on 08/05/2022 which confirmed a markedly hypermetabolic left hilar and suprahilar mass extending into the AP window and left hilum with hypermetabolic prevascular and left supraclavicular nodes and a small focus of activity in the posterior LUL adjacent to the fissure.  She met with Dr. 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  She has been followed by Dr. 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  She has continued on anastrozole and follow-up mammograms have not shown any evidence of disease recurrence.  She met back with Dr. Burr Medico today to discuss her newly diagnosed limited stage small cell lung cancer and an  MRI brain has been ordered to complete her disease staging. She has kindly been referred to [place] today to discuss the potential role of radiotherapy, concurrent with chemotherapy, in the management of the limited stage small cell lung cancer.  Her daughter, [name], is accompanying her today for this visit.  PREVIOUS RADIATION THERAPY: Yes  05/19/2017 to 06/16/2017 (Dr. Lisbeth Renshaw):   1. The Left breast was treated to 42.5 Gy in 17 fractions at 2.5 Gy per fraction.  2. The Left breast was boosted to 7.5 Gy in 3 fractions at 2.5 Gy per fraction.  PAST MEDICAL HISTORY:  Past Medical History:  Diagnosis Date   CAD (coronary artery disease)    Cypher stent 09/2002   Cancer ([place]) 2018   left breast, radiation therapy   CHF (congestive heart failure) ([place])    Depression    Diabetes mellitus ([place])    Hyperlipidemia    Hypertension    Myocardial infarction ([place])    2003,went into cardiac shock   Osteoporosis    Seizures ([place])    1st seizure age late 89's; most recent 07/28/17   Sinusitis       PAST SURGICAL HISTORY: Past Surgical History:  Procedure Laterality Date   ABDOMINAL HYSTERECTOMY     APPENDECTOMY     BREAST LUMPECTOMY WITH RADIOACTIVE SEED AND SENTINEL LYMPH NODE BIOPSY Left 03/16/2017   Procedure: INJECT BLUE DYE LEFT BREAST, LEFT BREAST LUMPECTOMY WITH RADIOACTIVE SEED AND LEFT AXILLARY DEEP SENTINEL LYMPH NODE  BIOPSY ADJACENT TISSUE TRANSFER;  Surgeon: [name], MD;  Location: [facility];  Service: General;  Laterality: Left;   CARPAL TUNNEL RELEASE Bilateral    COLONOSCOPY WITH PROPOFOL N/A 05/30/2015   Procedure: COLONOSCOPY WITH PROPOFOL;  Surgeon: Carol Ada, MD;  Location: [facility];  Service: Endoscopy;  Laterality: N/A;   COLONOSCOPY WITH PROPOFOL N/A 06/09/2018   Procedure: COLONOSCOPY WITH PROPOFOL;  Surgeon: Carol Ada, MD;  Location: [facility];  Service: Endoscopy;  Laterality: N/A;   COLONOSCOPY WITH PROPOFOL N/A 07/31/2021   Procedure: COLONOSCOPY WITH  PROPOFOL;  Surgeon: Carol Ada, MD;  Location: [facility];  Service: Endoscopy;  Laterality: N/A;   CORONARY ANGIOPLASTY WITH STENT PLACEMENT  10-02-2002   ESOPHAGOGASTRODUODENOSCOPY (EGD) WITH PROPOFOL N/A 07/31/2021   Procedure: ESOPHAGOGASTRODUODENOSCOPY (EGD) WITH PROPOFOL;  Surgeon: Carol Ada, MD;  [facility];  Service: Endoscopy;  Laterality: N/A;   Exploratory abdominal     Bleeding in abdomen after tubal   POLYPECTOMY  06/09/2018   Procedure: POLYPECTOMY;  Surgeon: Carol Ada, MD;  [facility]: [facility];  Service: Endoscopy;;   POLYPECTOMY  07/31/2021   Procedure: POLYPECTOMY;  Surgeon: Carol Ada, MD;  [facility];  Service: Endoscopy;;   TUBAL LIGATION     UMBILICAL HERNIA REPAIR      FAMILY HISTORY:  Family History  Problem Relation Age of Onset   Dementia Mother    Kidney disease Mother    Heart attack Father 51       Died age 91   Heart attack Brother 70   Heart attack Brother 34    SOCIAL HISTORY:  Social History   Socioeconomic History   Marital status: Married    Spouse name: Sonia Side   Number of children: 3   Years of education: 12   Highest education level: Not on file  Occupational History   Not on file  Tobacco Use   Smoking status: Former    Packs/day: 2.00    Years: 30.00    Total pack years: 60.00    Types: Cigarettes    Quit date: 12/28/2020    Years since quitting: 1.7   Smokeless tobacco: Never   Tobacco comments:    quit mid march 2022  Vaping Use   Vaping Use: Never used  Substance and Sexual Activity   Alcohol use: No    Alcohol/week: 0.0 standard drinks of alcohol   Drug use: No   Sexual activity: Not on file  Other Topics Concern   Not on file  Social History Narrative   Lives with husband.    Caffeine-    Social Determinants of Health   Financial Resource Strain: Not on file  Food Insecurity: Not on file  Transportation Needs: Not on file  Physical Activity: Not on file  Stress: Not on  file  Social Connections: Not on file  Intimate Partner Violence: Not on file    ALLERGIES: Ace inhibitors, Keflex [cephalexin], Phenobarbital, Sulfate, Altace [ramipril], and Dicyclomine hcl  MEDICATIONS:  Current Outpatient Medications  Medication Sig Dispense Refill   acetaminophen (TYLENOL) 500 MG tablet Take 1,000 mg by mouth every 6 (six) hours as needed for moderate pain or headache.     albuterol (VENTOLIN HFA) 108 (90 Base) MCG/ACT inhaler INHALE 2 PUFFS BY MOUTH EVERY 6 HOURS AS NEEDED FOR WHEEZING OR  SHORTHNESS  OF  BREATH 9 g 11   anastrozole (ARIMIDEX) 1 MG tablet Take 1 tablet (1 mg total) by mouth daily. 90 tablet 1   aspirin EC 81 MG tablet Take 81 mg by mouth at bedtime.  atorvastatin (LIPITOR) 40 MG tablet Take 40 mg by mouth at bedtime.     B-D ULTRAFINE III SHORT PEN 31G X 8 MM MISC Inject into the skin as directed.     Calcium Carbonate-Vitamin D (CALTRATE 600+D PO) Take 1 tablet by mouth in the morning and at bedtime.     carvedilol (COREG) 3.125 MG tablet Take 3.125 mg by mouth 2 (two) times daily.      cetirizine (ZYRTEC) 10 MG tablet Take 10 mg by mouth daily as needed for allergies.     clonazePAM (KLONOPIN) 1 MG tablet Take 1 mg by mouth in the morning and at bedtime.     Fluticasone-Umeclidin-Vilant (TRELEGY ELLIPTA) 100-62.5-25 MCG/ACT AEPB INHALE 1 PUFF ONCE DAILY RINSE  MOUTH  AFTER 60 each 11   Insulin Detemir (LEVEMIR) 100 UNIT/ML Pen Inject 17-25 Units into the skin See admin instructions. Inject 25 units subcutaneously in the morning and inject 17 units subcutaneously at night     insulin lispro (HUMALOG) 100 UNIT/ML KwikPen Inject 7-9 Units into the skin See admin instructions. Inject 7 units in the morning, 7 units at lunch, and 9 units at night     ketorolac (ACULAR) 0.5 % ophthalmic solution Place 1 drop into the left eye 4 (four) times daily.     levETIRAcetam (KEPPRA) 500 MG tablet Take 1 tablet (500 mg total) by mouth 2 (two) times daily. 180  tablet 3   losartan (COZAAR) 50 MG tablet Take 25 mg by mouth at bedtime.     moxifloxacin (VIGAMOX) 0.5 % ophthalmic solution Place 1 drop into the left eye 4 (four) times daily.     nitroGLYCERIN (NITROSTAT) 0.4 MG SL tablet Place 1 tablet (0.4 mg total) under the tongue every 5 (five) minutes as needed for chest pain. 25 tablet 2   Omega-3 Fatty Acids (FISH OIL) 1000 MG CAPS Take 1,000 mg by mouth in the morning and at bedtime.     omeprazole (PRILOSEC) 20 MG capsule Take 20 mg by mouth in the morning.     ONETOUCH VERIO test strip USE THREE TIMES A DAY AND AS NEEDED FOR HYPOGLYCEMIA     OXYGEN Inhale 2 L into the lungs at bedtime.     pioglitazone (ACTOS) 15 MG tablet Take 15 mg by mouth at bedtime.     Polyethyl Glycol-Propyl Glycol 0.4-0.3 % SOLN Place 1 drop into both eyes 2 (two) times daily as needed (itchy/irritated eyes.).     prednisoLONE acetate (PRED FORTE) 1 % ophthalmic suspension Place 1 drop into the left eye 4 (four) times daily.     risedronate (ACTONEL) 150 MG tablet Take 150 mg by mouth every 30 (thirty) days.     sertraline (ZOLOFT) 100 MG tablet Take 100 mg by mouth at bedtime.     TRIJARDY XR 12.5-2.02-999 MG TB24 Take 1 tablet by mouth 2 (two) times daily.     No current facility-administered medications for this visit.    REVIEW OF SYSTEMS:  On review of systems, the patient reports that she is doing well overall.  She denies any chest pain, increased shortness of breath, productive cough, hemoptysis, fevers, chills, night sweats, or unintended weight changes.  She denies any bowel or bladder disturbances, and denies abdominal pain, nausea or vomiting.  She denies any new musculoskeletal or joint aches or pains. A complete review of systems is obtained and is otherwise negative.    PHYSICAL EXAM:  Wt Readings from Last 3 Encounters:  09/16/22 103 lb  12.8 oz (47.1 kg)  09/14/22 105 lb (47.6 kg)  09/08/22 106 lb 3.2 oz (48.2 kg)   Temp Readings from Last 3  Encounters:  09/14/22 (!) 97.1 F (36.2 C)  09/01/22 97.8 F (36.6 C) (Oral)   BP Readings from Last 3 Encounters:  09/16/22 110/69  09/14/22 107/60  09/08/22 95/67   Pulse Readings from Last 3 Encounters:  09/16/22 68  09/14/22 (!) 59  09/08/22 67    /10  In general this is a well appearing Caucasian female in no acute distress.  She's alert and oriented x4 and appropriate throughout the examination. Cardiopulmonary assessment is negative for acute distress and she exhibits normal effort.   KPS = 100  100 - Normal; no complaints; no evidence of disease. 90   - Able to carry on normal activity; minor signs or symptoms of disease. 80   - Normal activity with effort; some signs or symptoms of disease. 28   - Cares for self; unable to carry on normal activity or to do active work. 60   - Requires occasional assistance, but is able to care for most of his personal needs. 50   - Requires considerable assistance and frequent medical care. 35   - Disabled; requires special care and assistance. 57   - Severely disabled; hospital admission is indicated although death not imminent. 71   - Very sick; hospital admission necessary; active supportive treatment necessary. 10   - Moribund; fatal processes progressing rapidly. 0     - Dead  Karnofsky DA, Abelmann Ketchum, Craver LS and Burchenal JH [phone]) The use of the nitrogen mustards in the palliative treatment of carcinoma: with particular reference to bronchogenic carcinoma Cancer 1 634-56  LABORATORY DATA:  Lab Results  Component Value Date   WBC 5.6 09/08/2022   HGB 15.1 (H) 09/08/2022   HCT 47.3 (H) 09/08/2022   MCV 74.4 (L) 09/08/2022   PLT 161 09/08/2022   Lab Results  Component Value Date   NA 137 09/08/2022   K 4.3 09/08/2022   CL 102 09/08/2022   CO2 30 09/08/2022   Lab Results  Component Value Date   ALT 11 09/08/2022   AST 15 09/08/2022   ALKPHOS 57 09/08/2022   BILITOT 0.4 09/08/2022     RADIOGRAPHY: [place] CORE  BIOPSY (LYMPH NODES)  Result Date: 09/14/2022 INDICATION: 76 year old with left suprahilar mass.  Tissue diagnosis is needed. EXAM: ULTRASOUND-GUIDED LEFT SUPRACLAVICULAR LYMPH NODE BIOPSY MEDICATIONS: 1% lidocaine for local anesthetic ANESTHESIA/SEDATION: None FLUOROSCOPY TIME:  None COMPLICATIONS: None immediate. PROCEDURE: Informed written consent was obtained from the patient after a thorough discussion of the procedural risks, benefits and alternatives. All questions were addressed. A timeout was performed prior to the initiation of the procedure. Enlarged left supraclavicular lymph nodes were identified with ultrasound. The left supraclavicular area was prepped with chlorhexidine and sterile field was created. Skin was anesthetized with 1% lidocaine. A small incision was made. Using ultrasound guidance, an 18 gauge core biopsy needle was directed into an enlarged left supraclavicular lymph node. Total of 5 core biopsies were obtained. Specimens placed in saline. Bandage placed over the puncture site. FINDINGS: Three enlarged left supraclavicular lymph nodes. Core biopsies were obtained from 1 of the enlarged left supraclavicular lymph nodes. No immediate bleeding or hematoma formation. IMPRESSION: Ultrasound-guided core biopsy of a left supraclavicular lymph node. Electronically Signed   By: Markus Daft M.D.   On: 09/14/2022 16:43      IMPRESSION/PLAN: 1. 76 y.o. female with  limited stage small cell lung cancer. Today, we talked to the patient and her daughter, [name], about the findings and workup thus far. We discussed the natural history of limited stage small cell carcinoma of the lung and general treatment, highlighting the role of radiotherapy in the management. We discussed the available radiation techniques, and focused on the details and logistics of delivery.  The recommendation is for a 6-1/2-week course of daily external beam radiation concurrent with chemotherapy.  We reviewed the anticipated  acute and late sequelae associated with radiation in this setting. The patient and her daughter were encouraged to ask questions that were answered to their stated satisfaction.  At the conclusion of our conversation, the patient is in agreement to proceed with an MRI brain to complete her disease staging and pending there are no unexpected findings, she would like to move forward with the recommended 6.5 week course of daily external beam radiation to the chest concurrent with chemotherapy.  She has freely signed written consent to proceed today in the office and a copy of this document will be placed in her medical record.  She is tentatively scheduled for CT simulation at 3 PM this afternoon in anticipation of beginning her daily radiation treatments on 09/28/2022, on her return from a planned family vacation to [place], and will be coordinated with the start of her systemic therapy.  We will share our discussion with Dr. Burr Medico and move forward with treatment planning accordingly.  We enjoyed meeting her and her daughter, [name], today and look forward to continuing to participate in her care.  We personally spent 70 minutes in this encounter including chart review, reviewing radiological studies, meeting face-to-face with the patient, entering orders and completing documentation.    Nicholos Johns, PA-C    Tyler Pita, MD  [facility] Direct Dial: [phone]  Fax: [phone] [email or web address]  Skype  LinkedIn

## 2022-09-16 NOTE — Progress Notes (Addendum)
I met with this pt and her daughter, [name], after her initial MedOnc appointment with Dr.Feng. I introduced myself and explained my role as a Art therapist. She is a newly diagnosed [facility] patient who will be starting chemotherapy and radiation soon. The pt is seeing Dr Tammi Klippel tomorrow for Rad Onc consult and simulation. Pt is planning on going out of town 12/9-12/16, however may be able to come back a day early so chemo education and an appt with audiology can be scheduled prior to starting her treatment. I will continue to pursue scheduling these appts and inform the pt as they are made and adjust them as accordingly. I provided my card to the pt and her daughter and encouraged them to call me with any questions or concerns.

## 2022-09-16 NOTE — Assessment & Plan Note (Signed)
-  Screening CT chest on July 22, 2022 showed suprahilar left upper lobe pulmonary mass and mediastinal adenopathy -PET scan from August 08, 2022 showed hypermetabolic left hilar lung mass with mediastinal and left supraclavicular nodal metastasis. 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 I discussed the biopsy results with patient and her daughter in detail. -Will obtain brain MRI with and without contrast to rule out brain metastasis. -I discussed and prognosis and treatment options.  This is limited stage small cell lung cancer, stage III with N3 disease, so the risk of recurrence is very high. I recommend concurrent chemo and radiation with cisplatin and etoposide every 3 weeks for 4 cycles.  He is 76 year old, but overall has good PS, is a candidate for chemo.  --Chemotherapy consent: Side effects including but does not not limited to, fatigue, nausea, vomiting, diarrhea, hair loss, hearing loss and neuropathy, fluid retention, renal and kidney dysfunction, neutropenic fever, needed for blood transfusion, bleeding, were discussed with patient in great detail. She agrees to proceed. -the goal of chemo is curative.  -I made urgent referral to radiation oncology today, Dr. Tammi Klippel will see her tomorrow.

## 2022-09-16 NOTE — Progress Notes (Signed)
[facility] [phone][facility] to schedule an appt for the pt prior to starting chemotherapy. The receptionist, Joseph Art, was kind enough to place a referral to Audiology and was able to get an appt for the pt on 12/12. I called the pt to confirm that she can be there at that date, because she already had a chemo education course on 12/12, as well. The patient states that she will still be out of town at that date and will need both appointments rescheduled. The pt is going to call me tomorrow to let me know if she will be able to come back early and have those appts on 12/15. I will call audiology tomorrow once the pt has contacted me and informed me of what she plans to do about her trip.

## 2022-09-16 NOTE — Progress Notes (Signed)
START ON PATHWAY REGIMEN - Small Cell Lung     A cycle is every 21 days:     Etoposide      Cisplatin   **Always confirm dose/schedule in your pharmacy ordering system**  Patient Characteristics: Newly Diagnosed, Preoperative or Nonsurgical Candidate (Clinical Staging), First Line, Limited Stage, Nonsurgical Candidate Therapeutic Status: Newly Diagnosed, Preoperative or Nonsurgical Candidate (Clinical Staging) AJCC T Category: cT3 AJCC N Category: cN3 AJCC M Category: cM0 AJCC 8 Stage Grouping: IIIC Stage Classification: Limited Surgical Candidacy: Nonsurgical Candidate Intent of Therapy: Curative Intent, Discussed with Patient

## 2022-09-17 ENCOUNTER — Ambulatory Visit (HOSPITAL_COMMUNITY)
Admission: RE | Admit: 2022-09-17 | Discharge: 2022-09-17 | Disposition: A | Discharge status: Home / Self Care | Payer: Medicare Other | Source: Ambulatory Visit | Attending: Hematology | Admitting: Hematology

## 2022-09-17 ENCOUNTER — Other Ambulatory Visit: Payer: Self-pay

## 2022-09-17 ENCOUNTER — Ambulatory Visit
Admission: RE | Admit: 2022-09-17 | Discharge: 2022-09-17 | Disposition: A | Discharge status: Home / Self Care | Payer: Medicare Other | Source: Ambulatory Visit | Attending: Radiation Oncology | Admitting: Radiation Oncology

## 2022-09-17 ENCOUNTER — Other Ambulatory Visit: Payer: Medicare Other

## 2022-09-17 VITALS — BP 106/67 | HR 65 | Temp 97.6°F | Resp 18 | Ht 65.0 in | Wt 104.4 lb

## 2022-09-17 DIAGNOSIS — M810 Age-related osteoporosis without current pathological fracture: Secondary | ICD-10-CM | POA: Insufficient documentation

## 2022-09-17 DIAGNOSIS — I509 Heart failure, unspecified: Secondary | ICD-10-CM | POA: Insufficient documentation

## 2022-09-17 DIAGNOSIS — Z79899 Other long term (current) drug therapy: Secondary | ICD-10-CM | POA: Insufficient documentation

## 2022-09-17 DIAGNOSIS — I2510 Atherosclerotic heart disease of native coronary artery without angina pectoris: Secondary | ICD-10-CM | POA: Insufficient documentation

## 2022-09-17 DIAGNOSIS — E119 Type 2 diabetes mellitus without complications: Secondary | ICD-10-CM | POA: Insufficient documentation

## 2022-09-17 DIAGNOSIS — Z452 Encounter for adjustment and management of vascular access device: Secondary | ICD-10-CM | POA: Diagnosis not present

## 2022-09-17 DIAGNOSIS — C3412 Malignant neoplasm of upper lobe, left bronchus or lung: Secondary | ICD-10-CM | POA: Insufficient documentation

## 2022-09-17 DIAGNOSIS — I252 Old myocardial infarction: Secondary | ICD-10-CM | POA: Insufficient documentation

## 2022-09-17 DIAGNOSIS — Z794 Long term (current) use of insulin: Secondary | ICD-10-CM | POA: Insufficient documentation

## 2022-09-17 DIAGNOSIS — Z79811 Long term (current) use of aromatase inhibitors: Secondary | ICD-10-CM | POA: Insufficient documentation

## 2022-09-17 DIAGNOSIS — C3490 Malignant neoplasm of unspecified part of unspecified bronchus or lung: Secondary | ICD-10-CM

## 2022-09-17 DIAGNOSIS — Z87891 Personal history of nicotine dependence: Secondary | ICD-10-CM | POA: Insufficient documentation

## 2022-09-17 DIAGNOSIS — Z7982 Long term (current) use of aspirin: Secondary | ICD-10-CM | POA: Insufficient documentation

## 2022-09-17 DIAGNOSIS — Z170 Estrogen receptor positive status [ER+]: Secondary | ICD-10-CM | POA: Insufficient documentation

## 2022-09-17 DIAGNOSIS — Z510 Encounter for antineoplastic radiation therapy: Secondary | ICD-10-CM | POA: Insufficient documentation

## 2022-09-17 DIAGNOSIS — I639 Cerebral infarction, unspecified: Secondary | ICD-10-CM | POA: Diagnosis not present

## 2022-09-17 DIAGNOSIS — D751 Secondary polycythemia: Secondary | ICD-10-CM | POA: Diagnosis not present

## 2022-09-17 DIAGNOSIS — Z7984 Long term (current) use of oral hypoglycemic drugs: Secondary | ICD-10-CM | POA: Insufficient documentation

## 2022-09-17 DIAGNOSIS — E785 Hyperlipidemia, unspecified: Secondary | ICD-10-CM | POA: Insufficient documentation

## 2022-09-17 DIAGNOSIS — C50212 Malignant neoplasm of upper-inner quadrant of left female breast: Secondary | ICD-10-CM | POA: Insufficient documentation

## 2022-09-17 DIAGNOSIS — Z5111 Encounter for antineoplastic chemotherapy: Secondary | ICD-10-CM | POA: Diagnosis not present

## 2022-09-17 DIAGNOSIS — D696 Thrombocytopenia, unspecified: Secondary | ICD-10-CM | POA: Diagnosis not present

## 2022-09-17 DIAGNOSIS — I10 Essential (primary) hypertension: Secondary | ICD-10-CM | POA: Insufficient documentation

## 2022-09-17 DIAGNOSIS — C50912 Malignant neoplasm of unspecified site of left female breast: Secondary | ICD-10-CM | POA: Diagnosis not present

## 2022-09-17 DIAGNOSIS — Z923 Personal history of irradiation: Secondary | ICD-10-CM | POA: Insufficient documentation

## 2022-09-17 MED ORDER — GADOBUTROL 1 MMOL/ML IV SOLN
5.0000 mL | Freq: Once | INTRAVENOUS | Status: AC | PRN
  Administered 2022-09-17: 5 mL via INTRAVENOUS

## 2022-09-17 NOTE — Progress Notes (Signed)
RadOnc staff sent a securechat asking me to call the pts daughter. Pt had just left from her initial consult and CT Simulation. I called the pt's daughter (Monica [name]) cell phone and we talked while on speaker phone with pt present. They wanted to know about the Chemo Education class and audiology appt scheduled on 12/12, as they were going to be out of town at that time (12/9-12/14) I explained to her that I had made the audiology appt on that day because the Chemo Ed class was scheduled as well, assuming her travel plans had been cancelled. Those plans have not changed, but the pt will be back in town so that next Friday 12/15, can be used for appts. I provided the pt's daughter with the scheduling numbers for Interventional Radiology to schedule the pts port placement and the number to central scheduling once I've rescheduled her other appts so an MRI can be booked. I will get in touch with the patient once that has been done.

## 2022-09-17 NOTE — Progress Notes (Signed)
The proposed treatment discussed in conference is for discussion purpose only and is not a binding recommendation.  The patients have not been physically examined, or presented with their treatment options.  Therefore, final treatment plans cannot be decided.  

## 2022-09-17 NOTE — Progress Notes (Signed)
  Radiation Oncology         ([phone] ________________________________  Name: Monica [name] MRN: [id number]  Date: 09/17/2022  DOB: 17-Feb-1946  SIMULATION AND TREATMENT PLANNING NOTE    ICD-10-CM   1. Small cell lung cancer (HCC)  C34.90       DIAGNOSIS:  76 year old female with small cell lung cancer in the LUL lung.  NARRATIVE:  The patient was brought to the [facility].  Identity was confirmed.  All relevant records and images related to the planned course of therapy were reviewed.  The patient freely provided informed written consent to proceed with treatment after reviewing the details related to the planned course of therapy. The consent form was witnessed and verified by the simulation staff.  Then, the patient was set-up in a stable reproducible  supine position for radiation therapy.  CT images were obtained.  Surface markings were placed.  The CT images were loaded into the planning software.  Then the target and avoidance structures were contoured.  Treatment planning then occurred.  The radiation prescription was entered and confirmed.  Then, I designed and supervised the construction of a total of 6 medically necessary complex treatment devices, including a BodyFix immobilization mold custom fitted to the patient along with 5 multileaf collimators conformally shaped radiation around the treatment target while shielding critical structures such as the heart and spinal cord maximally.  I have requested : 3D Simulation  I have requested a DVH of the following structures: Left lung, right lung, spinal cord, heart, esophagus, and target.  I have ordered:Nutrition Consult  SPECIAL TREATMENT PROCEDURE:  The planned course of therapy using radiation constitutes a special treatment procedure. Special care is required in the management of this patient for the following reasons.  The patient will be receiving concurrent chemotherapy requiring careful monitoring for  increased toxicities of treatment including periodic laboratory values.  The special nature of the planned course of radiotherapy will require increased physician supervision and oversight to ensure patient's safety with optimal treatment outcomes.  PLAN:  The patient will receive 66 Gy in 33 fractions.  ________________________________  Sheral Apley Tammi Klippel, M.D.

## 2022-09-20 ENCOUNTER — Telehealth: Payer: Self-pay

## 2022-09-20 NOTE — Telephone Encounter (Signed)
-----   Message from Truitt Merle, MD sent at 09/19/2022 11:10 PM EST ----- Please let pt know her brain MRI was negative, good news.   Truitt Merle

## 2022-09-21 ENCOUNTER — Other Ambulatory Visit: Payer: Medicare Other

## 2022-09-21 ENCOUNTER — Encounter: Payer: Self-pay | Admitting: Nurse Practitioner

## 2022-09-21 ENCOUNTER — Ambulatory Visit: Payer: Medicare Other | Admitting: Audiologist

## 2022-09-21 ENCOUNTER — Encounter: Payer: Self-pay | Admitting: Hematology

## 2022-09-21 NOTE — Telephone Encounter (Signed)
Called and advised patient of message below      ----- Message from Truitt Merle, MD sent at 09/19/2022 11:10 PM EST ----- Please let pt know her brain MRI was negative, good news.    Truitt Merle

## 2022-09-22 NOTE — Progress Notes (Signed)
Pharmacist Chemotherapy Monitoring - Initial Assessment    Anticipated start date: 09/29/22   The following has been reviewed per standard work regarding the patient's treatment regimen: The patient's diagnosis, treatment plan and drug doses, and organ/hematologic function Lab orders and baseline tests specific to treatment regimen  The treatment plan start date, drug sequencing, and pre-medications Prior authorization status  Patient's documented medication list, including drug-drug interaction screen and prescriptions for anti-emetics and supportive care specific to the treatment regimen The drug concentrations, fluid compatibility, administration routes, and timing of the medications to be used The patient's access for treatment and lifetime cumulative dose history, if applicable  The patient's medication allergies and previous infusion related reactions, if applicable   Changes made to treatment plan:  N/A  Follow up needed:  N/A   Larene Beach, RPH, 09/22/2022  12:54 PM

## 2022-09-24 ENCOUNTER — Inpatient Hospital Stay: Payer: Medicare Other

## 2022-09-24 ENCOUNTER — Ambulatory Visit: Payer: Medicare Other | Attending: Audiologist | Admitting: Audiologist

## 2022-09-24 ENCOUNTER — Other Ambulatory Visit: Payer: Self-pay | Admitting: Hematology

## 2022-09-24 DIAGNOSIS — C3412 Malignant neoplasm of upper lobe, left bronchus or lung: Secondary | ICD-10-CM | POA: Diagnosis not present

## 2022-09-24 DIAGNOSIS — H903 Sensorineural hearing loss, bilateral: Secondary | ICD-10-CM | POA: Insufficient documentation

## 2022-09-24 DIAGNOSIS — Z5111 Encounter for antineoplastic chemotherapy: Secondary | ICD-10-CM | POA: Diagnosis not present

## 2022-09-24 DIAGNOSIS — C50912 Malignant neoplasm of unspecified site of left female breast: Secondary | ICD-10-CM | POA: Diagnosis not present

## 2022-09-24 DIAGNOSIS — D696 Thrombocytopenia, unspecified: Secondary | ICD-10-CM | POA: Diagnosis not present

## 2022-09-24 DIAGNOSIS — Z87891 Personal history of nicotine dependence: Secondary | ICD-10-CM | POA: Diagnosis not present

## 2022-09-24 DIAGNOSIS — D751 Secondary polycythemia: Secondary | ICD-10-CM | POA: Diagnosis not present

## 2022-09-24 DIAGNOSIS — C3490 Malignant neoplasm of unspecified part of unspecified bronchus or lung: Secondary | ICD-10-CM

## 2022-09-24 DIAGNOSIS — Z79811 Long term (current) use of aromatase inhibitors: Secondary | ICD-10-CM | POA: Diagnosis not present

## 2022-09-24 DIAGNOSIS — Z452 Encounter for adjustment and management of vascular access device: Secondary | ICD-10-CM | POA: Diagnosis not present

## 2022-09-24 DIAGNOSIS — Z510 Encounter for antineoplastic radiation therapy: Secondary | ICD-10-CM | POA: Diagnosis not present

## 2022-09-24 DIAGNOSIS — Z170 Estrogen receptor positive status [ER+]: Secondary | ICD-10-CM | POA: Diagnosis not present

## 2022-09-24 MED ORDER — PROCHLORPERAZINE MALEATE 10 MG PO TABS: 10.0000 mg | ORAL_TABLET | Freq: Four times a day (QID) | ORAL | 1 refills | Status: DC | PRN

## 2022-09-24 MED ORDER — ONDANSETRON HCL 8 MG PO TABS: 8.0000 mg | ORAL_TABLET | Freq: Three times a day (TID) | ORAL | 1 refills | Status: DC | PRN

## 2022-09-24 MED ORDER — LIDOCAINE-PRILOCAINE 2.5-2.5 % EX CREA: TOPICAL_CREAM | CUTANEOUS | 3 refills | Status: DC

## 2022-09-24 NOTE — Procedures (Signed)
Outpatient Audiology and [place] [address], [place]  [id number] [phone]  Ototoxic Monitoring Baseline Audiologic Evaluation   NAME: NELIAH CUYLER     DOB:   November 20, 1945      MRN: [id number]                                                                                     DATE: 09/24/2022     REFERENT: Burr Medico STATUS: Outpatient DIAGNOSIS: Audiologic Evaluation for the Purpose of Ototoxic Monitoring   History: Norie was seen for an audiological evaluation. Oniya was accompanied to the appointment by her daughter . Brytni is receiving a hearing evaluation to establish their baseline hearing thresholds before treatment with ototoxic mediation. She is starting chemotherapy and radiation for lung cancer next week. This hearing test serves at a baseline for ototoxic monitoring. Johany has some difficulty hearing, especially if people are far away or in another room. She denies any ringing the ears, pain or pressure.   Results from today's evaluation will be used to measure changes in patient's hearing thresholds during and six months after ototoxic medication exposure.   Evaluation:  Otoscopy showed a clear view of the tympanic membranes, bilaterally Tympanometry results were consistent with normal middle ear function , bilaterally  Audiometric testing was completed using conventional and high frequency audiometry with supraural high frequency transducer. Speech Reception Thresholds were 35dB in the right ear and 30dB in the right ear, this is consistent with pure tone averages. Word Recognition was  excellent at elevated level of 40dBSL with 100% in the right ear and 96% in the left ear. 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 High frequency audiometry showed moderate sloping to severe levels from 8-12.5kHz  Sensitive Range to Ototoxicity (SRO)  is  9k to 11k Hz  three highest frequency thresholds  below 100dB    Results:  The test results were reviewed with Hassan Rowan. Due to the sloping sensorineural hearing loss in each ear she is already candidate for hearing aids in both ears. She and her family have many appointments in the next weeks. She is also having cataract surgery next Monday. Hearing aids will be discussed more at a futur appointment.  Today's baseline audiological evaluation showed that Addilynne's sensitive range to ototoxicity is at frequencies above 8kHz.  At future appointments only SRO threshold testing. Results will be compared to monitor change in hearing due to ototoxic medication.   During treatment with ototoxic medication hearing is more vulnerable to damage from high levels of noise. Make sure to keep headphone volume below 60% and wear headphones no longer than 60 minutes at a time. Avoid any noise louder than 85B, which is roughly equivalent to busy traffic noise. Wear an earplug with NRR of 25 to reduce noise levels. Proper insertion of a generic foam earplug is necessary to get the full reduction in volume. Over the ear muffs with an NRR of 25 are also recommended and are not susceptible to leakage from improper insertion.  For guidance on how to wear earplugs the correct way visit: [email or web address]    Recommendations: 1.   Return  for audiological evaluation 10/14/22   36 minutes spent testing and counseling on results.    Alfonse Alpers  Audiologist, Au.D., CCC-A 09/24/2022  11:41 AM  Cc:  Burr Medico

## 2022-09-27 DIAGNOSIS — H2512 Age-related nuclear cataract, left eye: Secondary | ICD-10-CM | POA: Diagnosis not present

## 2022-09-27 DIAGNOSIS — H2513 Age-related nuclear cataract, bilateral: Secondary | ICD-10-CM | POA: Diagnosis not present

## 2022-09-27 NOTE — H&P (Signed)
Chief Complaint: Patient was seen in consultation today for [facility] placement at the request of Monica [name]  Referring Physician(s): Monica [name]  Supervising Physician: Monica [name]  Patient Status: [facility] - Out-pt  History of Present Illness: Monica [name] is a 76 y.o. female with PMHs of HTN, HLD, CAD, CHF, DM, left breast CA and recent diagnosis of small cell LUL lung CA who presents for [facility] placement.   Patient is known to IR for left supraclavicular lymph node biopsy on 12/5, performed by Dr. Anselm Pancoast.   Patient was diagnosed with left breast CA in 2018 and she underwent lumpectomy and radiation therapy, has been on surveillance. Patient has been getting lung CA screening CT every year due to hx of smoking, CT chest on 07/24/22 raised concern for lung CA, PET scan on 08/08/22 showed  markedly hypermetabolic left hilar and suprahilar mass consistent with primary bronchogenic neoplasm. Patient underwent US guided left supraclavicular lymph node biopsy on 12/5, pathology revealed small cell carcinoma. A concurrent chemoradiation therapy was recommended to the patient which she decided to proceed.   IR was requested for image guided PAC placement.  Patient laying in bed, not in acute distress, sun at bedside.  Denise headache, fever, chills, shortness of breath, cough, chest pain, abdominal pain, nausea ,vomiting, and bleeding.  She reports that she had breakfast around 7:30 this morning.  Discussed performing PAC placement with Versed only which can be uncomfortable, she is OK to give it a try as she is scheduled for her chemotherapy soon.  Dr. Dwaine Gale and IR team notified.   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  shock   Osteoporosis    Seizures ([place])    1st seizure age late 60's; most recent 07/28/17   Sinusitis     Past Surgical History:  Procedure Laterality Date   ABDOMINAL HYSTERECTOMY     APPENDECTOMY     BREAST LUMPECTOMY WITH RADIOACTIVE SEED AND SENTINEL LYMPH NODE BIOPSY Left 03/16/2017   Procedure: INJECT BLUE DYE LEFT BREAST, LEFT BREAST LUMPECTOMY WITH RADIOACTIVE SEED AND LEFT AXILLARY DEEP SENTINEL LYMPH NODE  BIOPSY ADJACENT TISSUE TRANSFER;  Surgeon: Monica Skates, MD;  Location: [place];  Service: General;  Laterality: Left;   CARPAL TUNNEL RELEASE Bilateral    COLONOSCOPY WITH PROPOFOL N/A 05/30/2015   Procedure: COLONOSCOPY WITH PROPOFOL;  Surgeon: Carol Ada, MD;  Location: [facility];  Service: Endoscopy;  Laterality: N/A;   COLONOSCOPY WITH PROPOFOL N/A 06/09/2018   Procedure: COLONOSCOPY WITH PROPOFOL;  Surgeon: Carol Ada, MD;  Location: [facility];  Service: Endoscopy;  Laterality: N/A;   COLONOSCOPY WITH PROPOFOL N/A 07/31/2021   Procedure: COLONOSCOPY WITH PROPOFOL;  Surgeon: Carol Ada, MD;  Location: [facility];  Service: Endoscopy;  Laterality: N/A;   CORONARY ANGIOPLASTY WITH STENT PLACEMENT  10-02-2002   ESOPHAGOGASTRODUODENOSCOPY (EGD) WITH PROPOFOL N/A 07/31/2021   Procedure: ESOPHAGOGASTRODUODENOSCOPY (EGD) WITH PROPOFOL;  Surgeon: Carol Ada, MD;  Location: [facility];  Service: Endoscopy;  Laterality: N/A;   Exploratory abdominal     Bleeding in abdomen after tubal   POLYPECTOMY  06/09/2018   Procedure: POLYPECTOMY;  Surgeon: Carol Ada, MD;  Location: [facility];  Service:  Endoscopy;;   POLYPECTOMY  07/31/2021   Procedure: POLYPECTOMY;  Surgeon: Carol Ada, MD;  Location: [facility];  Service: Endoscopy;;   TUBAL LIGATION     UMBILICAL HERNIA REPAIR      Allergies: Ace inhibitors, Keflex [cephalexin], Phenobarbital, Sulfate, Altace [ramipril], and Dicyclomine hcl  Medications: Prior to Admission medications   Medication Sig  Start Date End Date Taking? Authorizing Provider  acetaminophen (TYLENOL) 500 MG tablet Take 1,000 mg by mouth every 6 (six) hours as needed for moderate pain or headache.    [provider]  albuterol (VENTOLIN HFA) 108 (90 Base) MCG/ACT inhaler INHALE 2 PUFFS BY MOUTH EVERY 6 HOURS AS NEEDED FOR WHEEZING OR  SHORTHNESS  OF  BREATH 07/14/21   Young, Monica Fuller D, MD  anastrozole (ARIMIDEX) 1 MG tablet Take 1 tablet (1 mg total) by mouth daily. 09/08/22   Truitt Merle, MD  aspirin EC 81 MG tablet Take 81 mg by mouth at bedtime.    [provider]  atorvastatin (LIPITOR) 40 MG tablet Take 40 mg by mouth at bedtime. 12/07/16   [provider]  B-[name] ULTRAFINE III SHORT PEN 31G X 8 MM MISC Inject into the skin as directed. 03/16/20   [provider]  Calcium Carbonate-Vitamin [name] (CALTRATE 600+[name] PO) Take 1 tablet by mouth in the morning and at bedtime.    [provider]  carvedilol (COREG) 3.125 MG tablet Take 3.125 mg by mouth 2 (two) times daily.     [provider]  cetirizine (ZYRTEC) 10 MG tablet Take 10 mg by mouth daily as needed for allergies.    [provider]  clonazePAM (KLONOPIN) 1 MG tablet Take 1 mg by mouth in the morning and at bedtime. 03/02/16   [provider]  Fluticasone-Umeclidin-Vilant (TRELEGY ELLIPTA) 100-62.5-25 MCG/ACT AEPB INHALE 1 PUFF ONCE DAILY RINSE  MOUTH  AFTER 03/26/22   Monica Lever, MD  Insulin Detemir (LEVEMIR) 100 UNIT/ML Pen Inject 17-25 Units into the skin See admin instructions. Inject 25 units subcutaneously in the morning and inject 17 units subcutaneously at night    [provider]  insulin lispro (HUMALOG) 100 UNIT/ML KwikPen Inject 7-9 Units into the skin See admin instructions. Inject 7 units in the morning, 7 units at lunch, and 9 units at night 06/18/21   [provider]  ketorolac (ACULAR) 0.5 % ophthalmic solution Place 1 drop into the left eye 4 (four) times daily. 06/03/22    [provider]  levETIRAcetam (KEPPRA) 500 MG tablet Take 1 tablet (500 mg total) by mouth 2 (two) times daily. 02/11/22   Lomax, Amy, NP  lidocaine-prilocaine (EMLA) cream Apply to affected area once 09/24/22   Truitt Merle, MD  losartan (COZAAR) 50 MG tablet Take 25 mg by mouth at bedtime.    [provider]  moxifloxacin (VIGAMOX) 0.5 % ophthalmic solution Place 1 drop into the left eye 4 (four) times daily. 06/03/22   [provider]  nitroGLYCERIN (NITROSTAT) 0.4 MG SL tablet Place 1 tablet (0.4 mg total) under the tongue every 5 (five) minutes as needed for chest pain. 04/10/21 09/13/22  Minus Breeding, MD  Omega-3 Fatty Acids (FISH OIL) 1000 MG CAPS Take 1,000 mg by mouth in the morning and at bedtime.    [provider]  omeprazole (PRILOSEC) 20 MG capsule Take 20 mg by mouth in the morning.    [provider]  ondansetron (ZOFRAN) 8 MG tablet Take 1 tablet (8 mg total) by mouth every  8 (eight) hours as needed for nausea or vomiting. Start on the third day after cisplatin. 09/24/22   Truitt Merle, MD  ONETOUCH VERIO test strip USE THREE TIMES A DAY AND AS NEEDED FOR HYPOGLYCEMIA 03/12/20   [provider]  OXYGEN Inhale 2 L into the lungs at bedtime.    [provider]  pioglitazone (ACTOS) 15 MG tablet Take 15 mg by mouth at bedtime. 06/18/21   [provider]  Polyethyl Glycol-Propyl Glycol 0.4-0.3 % SOLN Place 1 drop into both eyes 2 (two) times daily as needed (itchy/irritated eyes.).    [provider]  prednisoLONE acetate (PRED FORTE) 1 % ophthalmic suspension Place 1 drop into the left eye 4 (four) times daily. 06/03/22   [provider]  prochlorperazine (COMPAZINE) 10 MG tablet Take 1 tablet (10 mg total) by mouth every 6 (six) hours as needed for nausea or vomiting. 09/24/22   Truitt Merle, MD  risedronate (ACTONEL) 150 MG tablet Take 150 mg by mouth every 30 (thirty) days. 03/02/16   [provider]   sertraline (ZOLOFT) 100 MG tablet Take 100 mg by mouth at bedtime. 02/02/17   [provider]  TRIJARDY XR 12.5-2.02-999 MG TB24 Take 1 tablet by mouth 2 (two) times daily. 02/17/20   [provider]     Family History  Problem Relation Age of Onset   Dementia Mother    Kidney disease Mother    Heart attack Father 56       Died age 62   Heart attack Brother 80   Heart attack Brother 80    Social History   Socioeconomic History   Marital status: Married    Spouse name: Sonia Side   Number of children: 3   Years of education: 12   Highest education level: Not on file  Occupational History   Not on file  Tobacco Use   Smoking status: Former    Packs/day: 2.00    Years: 30.00    Total pack years: 60.00    Types: Cigarettes    Quit date: 12/28/2020    Years since quitting: 1.7   Smokeless tobacco: Never   Tobacco comments:    quit mid march 2022  Vaping Use   Vaping Use: Never used  Substance and Sexual Activity   Alcohol use: No    Alcohol/week: 0.0 standard drinks of alcohol   Drug use: No   Sexual activity: Not on file  Other Topics Concern   Not on file  Social History Narrative   Lives with husband.    Caffeine-    Social Determinants of Health   Financial Resource Strain: Not on file  Food Insecurity: Not on file  Transportation Needs: Not on file  Physical Activity: Not on file  Stress: Not on file  Social Connections: Not on file     Review of Systems: A 12 point ROS discussed and pertinent positives are indicated in the HPI above.  All other systems are negative.  Vital Signs: BP 103/67 (BP Location: Right Arm)   Pulse 62   Temp (!) 97.4 F (36.3 C) (Oral)   Resp 18   Ht 5\' 5"  (1.651 m)   Wt 104 lb 6.4 oz (47.4 kg)   SpO2 97%   BMI 17.37 kg/m    Physical Exam Vitals reviewed.  Constitutional:      General: She is not in acute distress.    Appearance: She is not ill-appearing.  HENT:     Head:  Normocephalic.      Mouth/Throat:     Mouth: Mucous membranes are moist.     Pharynx: Oropharynx is clear.  Cardiovascular:     Rate and Rhythm: Normal rate and regular rhythm.     Pulses: Normal pulses.     Heart sounds: Normal heart sounds.  Pulmonary:     Effort: Pulmonary effort is normal.     Breath sounds: Wheezing present.     Comments: Wheezing in LUL Abdominal:     General: Abdomen is flat. Bowel sounds are normal.     Palpations: Abdomen is soft.  Musculoskeletal:     Cervical back: Neck supple.  Skin:    General: Skin is warm and dry.     Coloration: Skin is not jaundiced or pale.     Findings: Bruising present.     Comments: Mild bruise around left Dalton joint, pt states that it is from the supraclavicular LN biopsy   Neurological:     Mental Status: She is alert and oriented to person, place, and time.  Psychiatric:        Mood and Affect: Mood normal.        Behavior: Behavior normal.        Judgment: Judgment normal.     MD Evaluation Airway: WNL Heart: WNL Abdomen: WNL Chest/ Lungs: WNL ASA  Classification: 3 Mallampati/Airway Score: One  Imaging: MR Brain W Wo Contrast  Result Date: 09/17/2022 CLINICAL DATA:  Non-small cell lung cancer, staging EXAM: MRI HEAD WITHOUT AND WITH CONTRAST TECHNIQUE: Multiplanar, multiecho pulse sequences of the brain and surrounding structures were obtained without and with intravenous contrast. CONTRAST:  104mL GADAVIST GADOBUTROL 1 MMOL/ML IV SOLN COMPARISON:  08/05/2017 FINDINGS: Brain: No abnormal parenchymal or meningeal enhancement. No restricted diffusion to suggest acute or subacute infarct. No acute hemorrhage, mass, mass effect, or midline shift. No hydrocephalus or extra-axial collection.No hemosiderin deposition to suggest remote hemorrhage.Normal pituitary and craniocervical junction.T2 hyperintense signal in the periventricular white matter and pons, likely the sequela of moderate chronic small vessel ischemic disease. Remote lacunar  infarct in the posterior left lentiform nucleus. Vascular: Patent arterial flow voids. Skull and upper cervical spine: Normal marrow signal. Sinuses/Orbits: Clear paranasal sinuses.No acute finding in the orbits. Other: The mastoid air cells are well aerated. IMPRESSION: No acute intracranial process. No evidence of metastatic disease. Electronically Signed   By: Merilyn Baba M.[name].   On: 09/17/2022 13:54   [place] CORE BIOPSY (LYMPH NODES)  Result Date: 09/14/2022 INDICATION: 76 year old with left suprahilar mass.  Tissue diagnosis is needed. EXAM: ULTRASOUND-GUIDED LEFT SUPRACLAVICULAR LYMPH NODE BIOPSY MEDICATIONS: 1% lidocaine for local anesthetic ANESTHESIA/SEDATION: None FLUOROSCOPY TIME:  None COMPLICATIONS: None immediate. PROCEDURE: Informed written consent was obtained from the patient after a thorough discussion of the procedural risks, benefits and alternatives. All questions were addressed. A timeout was performed prior to the initiation of the procedure. Enlarged left supraclavicular lymph nodes were identified with ultrasound. The left supraclavicular area was prepped with chlorhexidine and sterile field was created. Skin was anesthetized with 1% lidocaine. A small incision was made. Using ultrasound guidance, an 18 gauge core biopsy needle was directed into an enlarged left supraclavicular lymph node. Total of 5 core biopsies were obtained. Specimens placed in saline. Bandage placed over the puncture site. FINDINGS: Three enlarged left supraclavicular lymph nodes. Core biopsies were obtained from 1 of the enlarged left supraclavicular lymph nodes. No immediate bleeding or hematoma formation. IMPRESSION: Ultrasound-guided core biopsy of a left supraclavicular lymph node.  Electronically Signed   By: Markus Daft M.[name].   On: 09/14/2022 16:43    Labs:  CBC: Recent Labs    12/01/21 1110 03/03/22 1211 09/08/22 1048  WBC 6.3 6.1 5.6  HGB 16.7* 16.4* 15.1*  HCT 51.1* 50.8* 47.3*  PLT 135* 145* 161     COAGS: No results for input(s): "INR", "APTT" in the last 8760 hours.  BMP: Recent Labs    12/01/21 1110 03/03/22 1211 09/08/22 1048  NA 138 139 137  K 4.7 4.3 4.3  CL 102 104 102  CO2 30 28 30   GLUCOSE 97 156* 130*  BUN 8 9 14   CALCIUM 9.9 9.6 10.1  CREATININE 0.58 0.61 0.57  GFRNONAA >60 >60 >60    LIVER FUNCTION TESTS: Recent Labs    12/01/21 1110 03/03/22 1211 09/08/22 1048  BILITOT 0.6 0.4 0.4  AST 19 16 15   ALT 15 13 11   ALKPHOS 55 73 57  PROT 7.4 7.5 7.7  ALBUMIN 4.5 4.5 4.5    TUMOR MARKERS: No results for input(s): "AFPTM", "CEA", "CA199", "CHROMGRNA" in the last 8760 hours.  Assessment and Plan: 76 y.o. female with hx left breast CA s/p lumpectomy and chemoradiation therapy and recent diagnosis of LUL small cell carcinoma, she is in need of long term CVC.   Had breakfast at 7:30, will proceed with Versed and lidocaine only - patient agreeable.  VSS On ASA 81 mg, no need for [name]/c.   Risks and benefits of image guided port-a-catheter placement was discussed with the patient including, but not limited to bleeding, infection, pneumothorax, or fibrin sheath development and need for additional procedures.  All of the patient's questions were answered, patient is agreeable to proceed. Consent signed and in chart.    Thank you for this interesting consult.  I greatly enjoyed meeting DAVISHA LINTHICUM and look forward to participating in their care.  A copy of this report was sent to the requesting provider on this date.  Electronically Signed: Tera Mater, PA-C 09/28/2022, 10:43 AM   I spent a total of    25 Minutes in face to face in clinical consultation, greater than 50% of which was counseling/coordinating care for [facility] placement.  This chart was dictated using voice recognition software.  Despite best efforts to proofread,  errors can occur which can change the documentation meaning.

## 2022-09-28 ENCOUNTER — Ambulatory Visit (HOSPITAL_COMMUNITY)
Admission: RE | Admit: 2022-09-28 | Discharge: 2022-09-28 | Disposition: A | Discharge status: Home / Self Care | Payer: Medicare Other | Source: Ambulatory Visit

## 2022-09-28 ENCOUNTER — Encounter (HOSPITAL_COMMUNITY): Payer: Self-pay

## 2022-09-28 ENCOUNTER — Other Ambulatory Visit (HOSPITAL_COMMUNITY): Payer: Self-pay | Admitting: Physician Assistant

## 2022-09-28 ENCOUNTER — Other Ambulatory Visit: Payer: Self-pay

## 2022-09-28 ENCOUNTER — Ambulatory Visit (HOSPITAL_COMMUNITY)
Admission: RE | Admit: 2022-09-28 | Discharge: 2022-09-28 | Disposition: A | Discharge status: Home / Self Care | Payer: Medicare Other | Source: Ambulatory Visit | Attending: Hematology | Admitting: Hematology

## 2022-09-28 ENCOUNTER — Ambulatory Visit
Admission: RE | Admit: 2022-09-28 | Discharge: 2022-09-28 | Disposition: A | Discharge status: Home / Self Care | Payer: Medicare Other | Source: Ambulatory Visit | Attending: Radiation Oncology | Admitting: Radiation Oncology

## 2022-09-28 DIAGNOSIS — E119 Type 2 diabetes mellitus without complications: Secondary | ICD-10-CM | POA: Insufficient documentation

## 2022-09-28 DIAGNOSIS — H25041 Posterior subcapsular polar age-related cataract, right eye: Secondary | ICD-10-CM | POA: Diagnosis not present

## 2022-09-28 DIAGNOSIS — C3412 Malignant neoplasm of upper lobe, left bronchus or lung: Secondary | ICD-10-CM | POA: Diagnosis not present

## 2022-09-28 DIAGNOSIS — I110 Hypertensive heart disease with heart failure: Secondary | ICD-10-CM | POA: Insufficient documentation

## 2022-09-28 DIAGNOSIS — C3490 Malignant neoplasm of unspecified part of unspecified bronchus or lung: Secondary | ICD-10-CM | POA: Diagnosis not present

## 2022-09-28 DIAGNOSIS — Z87891 Personal history of nicotine dependence: Secondary | ICD-10-CM | POA: Insufficient documentation

## 2022-09-28 DIAGNOSIS — E785 Hyperlipidemia, unspecified: Secondary | ICD-10-CM | POA: Insufficient documentation

## 2022-09-28 DIAGNOSIS — Z79811 Long term (current) use of aromatase inhibitors: Secondary | ICD-10-CM | POA: Diagnosis not present

## 2022-09-28 DIAGNOSIS — H2511 Age-related nuclear cataract, right eye: Secondary | ICD-10-CM | POA: Diagnosis not present

## 2022-09-28 DIAGNOSIS — D751 Secondary polycythemia: Secondary | ICD-10-CM | POA: Diagnosis not present

## 2022-09-28 DIAGNOSIS — I509 Heart failure, unspecified: Secondary | ICD-10-CM | POA: Diagnosis not present

## 2022-09-28 DIAGNOSIS — Z510 Encounter for antineoplastic radiation therapy: Secondary | ICD-10-CM | POA: Diagnosis not present

## 2022-09-28 DIAGNOSIS — I2510 Atherosclerotic heart disease of native coronary artery without angina pectoris: Secondary | ICD-10-CM | POA: Diagnosis not present

## 2022-09-28 DIAGNOSIS — Z5111 Encounter for antineoplastic chemotherapy: Secondary | ICD-10-CM | POA: Diagnosis not present

## 2022-09-28 DIAGNOSIS — Z170 Estrogen receptor positive status [ER+]: Secondary | ICD-10-CM | POA: Diagnosis not present

## 2022-09-28 DIAGNOSIS — C50912 Malignant neoplasm of unspecified site of left female breast: Secondary | ICD-10-CM | POA: Diagnosis not present

## 2022-09-28 DIAGNOSIS — Z853 Personal history of malignant neoplasm of breast: Secondary | ICD-10-CM | POA: Insufficient documentation

## 2022-09-28 DIAGNOSIS — Z452 Encounter for adjustment and management of vascular access device: Secondary | ICD-10-CM | POA: Diagnosis not present

## 2022-09-28 DIAGNOSIS — H25011 Cortical age-related cataract, right eye: Secondary | ICD-10-CM | POA: Diagnosis not present

## 2022-09-28 DIAGNOSIS — D696 Thrombocytopenia, unspecified: Secondary | ICD-10-CM | POA: Diagnosis not present

## 2022-09-28 HISTORY — PX: IR IMAGING GUIDED PORT INSERTION: IMG5740

## 2022-09-28 LAB — RAD ONC ARIA SESSION SUMMARY
Course Elapsed Days: 0
Plan Fractions Treated to Date: 1
Plan Prescribed Dose Per Fraction: 2 Gy
Plan Total Fractions Prescribed: 33
Plan Total Prescribed Dose: 66 Gy
Reference Point Dosage Given to Date: 2 Gy
Reference Point Session Dosage Given: 2 Gy
Session Number: 1

## 2022-09-28 LAB — GLUCOSE, CAPILLARY: Glucose-Capillary: 168 mg/dL — ABNORMAL HIGH (ref 70–99)

## 2022-09-28 MED ORDER — FENTANYL CITRATE (PF) 100 MCG/2ML IJ SOLN
INTRAMUSCULAR | Status: AC
  Filled 2022-09-28: qty 2

## 2022-09-28 MED ORDER — LIDOCAINE-EPINEPHRINE 1 %-1:100000 IJ SOLN
INTRAMUSCULAR | Status: AC
  Administered 2022-09-28: 9 mL via INTRADERMAL
  Filled 2022-09-28: qty 1

## 2022-09-28 MED ORDER — HEPARIN SOD (PORK) LOCK FLUSH 100 UNIT/ML IV SOLN
INTRAVENOUS | Status: AC
  Administered 2022-09-28: 500 [IU] via INTRAVENOUS
  Filled 2022-09-28: qty 5

## 2022-09-28 MED ORDER — SODIUM CHLORIDE 0.9 % IV SOLN: INTRAVENOUS | Status: DC

## 2022-09-28 MED ORDER — FENTANYL CITRATE (PF) 100 MCG/2ML IJ SOLN
INTRAMUSCULAR | Status: AC | PRN
  Administered 2022-09-28 (×2): 50 ug via INTRAVENOUS

## 2022-09-28 MED FILL — Fosaprepitant Dimeglumine For IV Infusion 150 MG (Base Eq): INTRAVENOUS | Qty: 5 | Status: AC

## 2022-09-28 MED FILL — Dexamethasone Sodium Phosphate Inj 100 MG/10ML: INTRAMUSCULAR | Qty: 1 | Status: AC

## 2022-09-28 NOTE — Discharge Instructions (Addendum)
Please call Interventional Radiology clinic [phone] with any questions or concerns.  You may remove your dressing and shower tomorrow.  DO NOT use EMLA cream on your port site for 2 weeks as this cream will remove surgical glue on your incision.  Implanted Port Insertion, Care After This sheet gives you information about how to care for yourself after your procedure. Your health care provider may also give you more specific instructions. If you have problems or questions, contact your health care provider. What can I expect after the procedure? After the procedure, it is common to have: Discomfort at the port insertion site. Bruising on the skin over the port. This should improve over 3-4 days. Follow these instructions at home: [facility] care After your port is placed, you will get a manufacturer's information card. The card has information about your port. Keep this card with you at all times. Take care of the port as told by your health care provider. Ask your health care provider if you or a family member can get training for taking care of the port at home. A home health care nurse may also take care of the port. Make sure to remember what type of port you have. Incision care Follow instructions from your health care provider about how to take care of your port insertion site. Make sure you: Wash your hands with soap and water before and after you change your bandage (dressing). If soap and water are not available, use hand sanitizer. Change your dressing as told by your health care provider. Leave stitches (sutures), skin glue, or adhesive strips in place. These skin closures may need to stay in place for 2 weeks or longer. If adhesive strip edges start to loosen and curl up, you may trim the loose edges. Do not remove adhesive strips completely unless your health care provider tells you to do that. Check your port insertion site every day for signs of infection. Check for: Redness,  swelling, or pain. Fluid or blood. Warmth. Pus or a bad smell.        Activity Return to your normal activities as told by your health care provider. Ask your health care provider what activities are safe for you. Do not lift anything that is heavier than 10 lb (4.5 kg), or the limit that you are told, until your health care provider says that it is safe. General instructions Take over-the-counter and prescription medicines only as told by your health care provider. Do not take baths, swim, or use a hot tub until your health care provider approves. Ask your health care provider if you may take showers. You may only be allowed to take sponge baths. Do not drive for 24 hours if you were given a sedative during your procedure. Wear a medical alert bracelet in case of an emergency. This will tell any health care providers that you have a port. Keep all follow-up visits as told by your health care provider. This is important. Contact a health care provider if: You cannot flush your port with saline as directed, or you cannot draw blood from the port. You have a fever or chills. You have redness, swelling, or pain around your port insertion site. You have fluid or blood coming from your port insertion site. Your port insertion site feels warm to the touch. You have pus or a bad smell coming from the port insertion site. Get help right away if: You have chest pain or shortness of breath. You have bleeding from  your port that you cannot control. Summary Take care of the port as told by your health care provider. Keep the manufacturer's information card with you at all times. Change your dressing as told by your health care provider. Contact a health care provider if you have a fever or chills or if you have redness, swelling, or pain around your port insertion site. Keep all follow-up visits as told by your health care provider. This information is not intended to replace advice given to you  by your health care provider. Make sure you discuss any questions you have with your health care provider. Document Revised: 04/25/2018 Document Reviewed: 04/25/2018 Elsevier Patient Education  [place].

## 2022-09-28 NOTE — Procedures (Signed)
Interventional Radiology Procedure Note  Procedure: Port placement.  Indication: Lung Ca  Findings: Please refer to procedural dictation for full description.  Complications: None  EBL: < 10 mL  Miachel Roux, MD [phone]

## 2022-09-29 ENCOUNTER — Inpatient Hospital Stay: Payer: Medicare Other

## 2022-09-29 ENCOUNTER — Other Ambulatory Visit: Payer: Self-pay

## 2022-09-29 ENCOUNTER — Other Ambulatory Visit: Payer: Medicare Other

## 2022-09-29 ENCOUNTER — Ambulatory Visit
Admission: RE | Admit: 2022-09-29 | Discharge: 2022-09-29 | Disposition: A | Discharge status: Home / Self Care | Payer: Medicare Other | Source: Ambulatory Visit | Attending: Radiation Oncology | Admitting: Radiation Oncology

## 2022-09-29 VITALS — BP 100/70 | HR 62 | Temp 98.2°F | Resp 18 | Wt 107.5 lb

## 2022-09-29 DIAGNOSIS — C3412 Malignant neoplasm of upper lobe, left bronchus or lung: Secondary | ICD-10-CM | POA: Diagnosis not present

## 2022-09-29 DIAGNOSIS — Z87891 Personal history of nicotine dependence: Secondary | ICD-10-CM | POA: Diagnosis not present

## 2022-09-29 DIAGNOSIS — Z95828 Presence of other vascular implants and grafts: Secondary | ICD-10-CM

## 2022-09-29 DIAGNOSIS — Z170 Estrogen receptor positive status [ER+]: Secondary | ICD-10-CM | POA: Diagnosis not present

## 2022-09-29 DIAGNOSIS — C50912 Malignant neoplasm of unspecified site of left female breast: Secondary | ICD-10-CM | POA: Diagnosis not present

## 2022-09-29 DIAGNOSIS — Z510 Encounter for antineoplastic radiation therapy: Secondary | ICD-10-CM | POA: Diagnosis not present

## 2022-09-29 DIAGNOSIS — D751 Secondary polycythemia: Secondary | ICD-10-CM | POA: Diagnosis not present

## 2022-09-29 DIAGNOSIS — C3490 Malignant neoplasm of unspecified part of unspecified bronchus or lung: Secondary | ICD-10-CM

## 2022-09-29 DIAGNOSIS — D696 Thrombocytopenia, unspecified: Secondary | ICD-10-CM | POA: Diagnosis not present

## 2022-09-29 DIAGNOSIS — Z5111 Encounter for antineoplastic chemotherapy: Secondary | ICD-10-CM | POA: Diagnosis not present

## 2022-09-29 DIAGNOSIS — Z452 Encounter for adjustment and management of vascular access device: Secondary | ICD-10-CM | POA: Diagnosis not present

## 2022-09-29 DIAGNOSIS — Z79811 Long term (current) use of aromatase inhibitors: Secondary | ICD-10-CM | POA: Diagnosis not present

## 2022-09-29 LAB — CMP (CANCER CENTER ONLY)
ALT: 14 U/L (ref 0–44)
AST: 21 U/L (ref 15–41)
Albumin: 3.5 g/dL (ref 3.5–5.0)
Alkaline Phosphatase: 42 U/L (ref 38–126)
Anion gap: 8 (ref 5–15)
BUN: 11 mg/dL (ref 8–23)
CO2: 24 mmol/L (ref 22–32)
Calcium: 9 mg/dL (ref 8.9–10.3)
Chloride: 104 mmol/L (ref 98–111)
Creatinine: 0.45 mg/dL (ref 0.44–1.00)
GFR, Estimated: >60 mL/min
Glucose, Bld: 117 mg/dL — ABNORMAL HIGH (ref 70–99)
Potassium: 3.8 mmol/L (ref 3.5–5.1)
Sodium: 136 mmol/L (ref 135–145)
Total Bilirubin: 0.6 mg/dL (ref 0.3–1.2)
Total Protein: 7 g/dL (ref 6.5–8.1)

## 2022-09-29 LAB — CBC WITH DIFFERENTIAL (CANCER CENTER ONLY)
Abs Immature Granulocytes: 0.01 10*3/uL (ref 0.00–0.07)
Basophils Absolute: 0 10*3/uL (ref 0.0–0.1)
Basophils Relative: 0 %
Eosinophils Absolute: 0.1 10*3/uL (ref 0.0–0.5)
Eosinophils Relative: 2 %
HCT: 42.4 % (ref 36.0–46.0)
Hemoglobin: 13.6 g/dL (ref 12.0–15.0)
Immature Granulocytes: 0 %
Lymphocytes Relative: 21 %
Lymphs Abs: 1.1 10*3/uL (ref 0.7–4.0)
MCH: 24 pg — ABNORMAL LOW (ref 26.0–34.0)
MCHC: 32.1 g/dL (ref 30.0–36.0)
MCV: 74.8 fL — ABNORMAL LOW (ref 80.0–100.0)
Monocytes Absolute: 0.3 10*3/uL (ref 0.1–1.0)
Monocytes Relative: 6 %
Neutro Abs: 3.6 10*3/uL (ref 1.7–7.7)
Neutrophils Relative %: 71 %
Platelet Count: 135 10*3/uL — ABNORMAL LOW (ref 150–400)
RBC: 5.67 MIL/uL — ABNORMAL HIGH (ref 3.87–5.11)
RDW: 16.2 % — ABNORMAL HIGH (ref 11.5–15.5)
WBC Count: 5.1 10*3/uL (ref 4.0–10.5)
nRBC: 0 % (ref 0.0–0.2)

## 2022-09-29 LAB — RAD ONC ARIA SESSION SUMMARY
Course Elapsed Days: 1
Plan Fractions Treated to Date: 2
Plan Prescribed Dose Per Fraction: 2 Gy
Plan Total Fractions Prescribed: 33
Plan Total Prescribed Dose: 66 Gy
Reference Point Dosage Given to Date: 4 Gy
Reference Point Session Dosage Given: 2 Gy
Session Number: 2

## 2022-09-29 LAB — MAGNESIUM: Magnesium: 1.8 mg/dL (ref 1.7–2.4)

## 2022-09-29 MED ORDER — SODIUM CHLORIDE 0.9 % IV SOLN
100.0000 mg/m2 | Freq: Once | INTRAVENOUS | Status: AC
  Administered 2022-09-29: 150 mg via INTRAVENOUS
  Filled 2022-09-29: qty 7.5

## 2022-09-29 MED ORDER — SODIUM CHLORIDE 0.9 % IV SOLN
150.0000 mg | Freq: Once | INTRAVENOUS | Status: AC
  Administered 2022-09-29: 150 mg via INTRAVENOUS
  Filled 2022-09-29: qty 150

## 2022-09-29 MED ORDER — SODIUM CHLORIDE 0.9 % IV SOLN
10.0000 mg | Freq: Once | INTRAVENOUS | Status: AC
  Administered 2022-09-29: 10 mg via INTRAVENOUS
  Filled 2022-09-29: qty 10

## 2022-09-29 MED ORDER — MAGNESIUM SULFATE 2 GM/50ML IV SOLN
2.0000 g | Freq: Once | INTRAVENOUS | Status: AC
  Administered 2022-09-29: 2 g via INTRAVENOUS
  Filled 2022-09-29: qty 50

## 2022-09-29 MED ORDER — SODIUM CHLORIDE 0.9 % IV SOLN
80.0000 mg/m2 | Freq: Once | INTRAVENOUS | Status: AC
  Administered 2022-09-29: 118 mg via INTRAVENOUS
  Filled 2022-09-29: qty 118

## 2022-09-29 MED ORDER — SODIUM CHLORIDE 0.9% FLUSH
10.0000 mL | INTRAVENOUS | Status: DC | PRN
  Administered 2022-09-29: 10 mL

## 2022-09-29 MED ORDER — PALONOSETRON HCL INJECTION 0.25 MG/5ML
0.2500 mg | Freq: Once | INTRAVENOUS | Status: AC
  Administered 2022-09-29: 0.25 mg via INTRAVENOUS
  Filled 2022-09-29: qty 5

## 2022-09-29 MED ORDER — POTASSIUM CHLORIDE IN NACL 20-0.9 MEQ/L-% IV SOLN
Freq: Once | INTRAVENOUS | Status: AC
  Filled 2022-09-29: qty 1000

## 2022-09-29 MED ORDER — SODIUM CHLORIDE 0.9 % IV SOLN: INTRAVENOUS | Status: DC

## 2022-09-29 MED ORDER — SODIUM CHLORIDE 0.9 % IV SOLN: Freq: Once | INTRAVENOUS | Status: AC

## 2022-09-29 MED FILL — Dexamethasone Sodium Phosphate Inj 100 MG/10ML: INTRAMUSCULAR | Qty: 1 | Status: AC

## 2022-09-29 NOTE — Assessment & Plan Note (Signed)
-  [id number], stage III, limited stage  -found on screening CT chest on July 22, 2022 which showed suprahilar left upper lobe pulmonary mass and mediastinal adenopathy -PET scan from August 08, 2022 showed hypermetabolic left hilar lung mass with mediastinal and left supraclavicular nodal metastasis. 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 Brain MRI negative  -started concurrent chemo cisplatin/etoposide and radiation 09/29/2022

## 2022-09-29 NOTE — Patient Instructions (Signed)
[facility]  Discharge Instructions: Thank you for choosing [facility] to provide your oncology and hematology care.   If you have a lab appointment with the [facility], please go directly to the [facility] and check in at the registration area.   Wear comfortable clothing and clothing appropriate for easy access to any Portacath or PICC line.   We strive to give you quality time with your provider. You may need to reschedule your appointment if you arrive late (15 or more minutes).  Arriving late affects you and other patients whose appointments are after yours.  Also, if you miss three or more appointments without notifying the office, you may be dismissed from the clinic at the provider's discretion.      For prescription refill requests, have your pharmacy contact our office and allow 72 hours for refills to be completed.    Today you received the following chemotherapy and/or immunotherapy agents: cisplatin, etoposide    To help prevent nausea and vomiting after your treatment, we encourage you to take your nausea medication as directed.  BELOW ARE SYMPTOMS THAT SHOULD BE REPORTED IMMEDIATELY: *FEVER GREATER THAN 100.4 F (38 C) OR HIGHER *CHILLS OR SWEATING *NAUSEA AND VOMITING THAT IS NOT CONTROLLED WITH YOUR NAUSEA MEDICATION *UNUSUAL SHORTNESS OF BREATH *UNUSUAL BRUISING OR BLEEDING *URINARY PROBLEMS (pain or burning when urinating, or frequent urination) *BOWEL PROBLEMS (unusual diarrhea, constipation, pain near the anus) TENDERNESS IN MOUTH AND THROAT WITH OR WITHOUT PRESENCE OF ULCERS (sore throat, sores in mouth, or a toothache) UNUSUAL RASH, SWELLING OR PAIN  UNUSUAL VAGINAL DISCHARGE OR ITCHING   Items with * indicate a potential emergency and should be followed up as soon as possible or go to the Emergency Department if any problems should occur.  Please show the CHEMOTHERAPY ALERT CARD or IMMUNOTHERAPY ALERT CARD at  check-in to the Emergency Department and triage nurse.  Should you have questions after your visit or need to cancel or reschedule your appointment, please contact [facility]  Dept: [phone]  and follow the prompts.  Office hours are 8:00 a.m. to 4:30 p.m. Monday - Friday. Please note that voicemails left after 4:00 p.m. may not be returned until the following business day.  We are closed weekends and major holidays. You have access to a nurse at all times for urgent questions. Please call the main number to the clinic Dept: [phone] and follow the prompts.   For any non-urgent questions, you may also contact your provider using MyChart. We now offer e-Visits for anyone 62 and older to request care online for non-urgent symptoms. For details visit [email or web address].   Also download the MyChart app! Go to the app store, search "MyChart", open the app, select [facility], and log in with your MyChart username and password.  Masks are optional in the cancer centers. If you would like for your care team to wear a mask while they are taking care of you, please let them know. You may have one support person who is at least 76 years old accompany you for your appointments.    Cisplatin Injection What is this medication? CISPLATIN (SIS pla tin) treats some types of cancer. It works by slowing down the growth of cancer cells. This medicine may be used for other purposes; ask your health care provider or pharmacist if you have questions. COMMON BRAND NAME(S): Platinol, Platinol -AQ What should I tell my care team before I take  this medication? They need to know if you have any of these conditions: Eye disease, vision problems Hearing problems Kidney disease Low blood counts, such as low white cells, platelets, or red blood cells Tingling of the fingers or toes, or other nerve disorder An unusual or allergic reaction to cisplatin, carboplatin, oxaliplatin,  other medications, foods, dyes, or preservatives If you or your partner are pregnant or trying to get pregnant Breast-feeding How should I use this medication? This medication is injected into a vein. It is given by your care team in a hospital or clinic setting. Talk to your care team about the use of this medication in children. Special care may be needed. Overdosage: If you think you have taken too much of this medicine contact a poison control center or emergency room at once. NOTE: This medicine is only for you. Do not share this medicine with others. What if I miss a dose? Keep appointments for follow-up doses. It is important not to miss your dose. Call your care team if you are unable to keep an appointment. What may interact with this medication? Do not take this medication with any of the following: Live virus vaccines This medication may also interact with the following: Certain antibiotics, such as amikacin, gentamicin, neomycin, polymyxin B, streptomycin, tobramycin, vancomycin Foscarnet This list may not describe all possible interactions. Give your health care provider a list of all the medicines, herbs, non-prescription drugs, or dietary supplements you use. Also tell them if you smoke, drink alcohol, or use illegal drugs. Some items may interact with your medicine. What should I watch for while using this medication? Your condition will be monitored carefully while you are receiving this medication. You may need blood work done while taking this medication. This medication may make you feel generally unwell. This is not uncommon, as chemotherapy can affect healthy cells as well as cancer cells. Report any side effects. Continue your course of treatment even though you feel ill unless your care team tells you to stop. This medication may increase your risk of getting an infection. Call your care team for advice if you get a fever, chills, sore throat, or other symptoms of a cold  or flu. Do not treat yourself. Try to avoid being around people who are sick. Avoid taking medications that contain aspirin, acetaminophen, ibuprofen, naproxen, or ketoprofen unless instructed by your care team. These medications may hide a fever. This medication may increase your risk to bruise or bleed. Call your care team if you notice any unusual bleeding. Be careful brushing or flossing your teeth or using a toothpick because you may get an infection or bleed more easily. If you have any dental work done, tell your dentist you are receiving this medication. Drink fluids as directed while you are taking this medication. This will help protect your kidneys. Call your care team if you get diarrhea. Do not treat yourself. Talk to your care team if you or your partner wish to become pregnant or think you might be pregnant. This medication can cause serious birth defects if taken during pregnancy and for 14 months after the last dose. A negative pregnancy test is required before starting this medication. A reliable form of contraception is recommended while taking this medication and for 14 months after the last dose. Talk to your care team about effective forms of contraception. Do not father a child while taking this medication and for 11 months after the last dose. Use a condom during sex during  this time period. Do not breast-feed while taking this medication. This medication may cause infertility. Talk to your care team if you are concerned about your fertility. What side effects may I notice from receiving this medication? Side effects that you should report to your care team as soon as possible: Allergic reactions--skin rash, itching, hives, swelling of the face, lips, tongue, or throat Eye pain, change in vision, vision loss Hearing loss, ringing in ears Infection--fever, chills, cough, sore throat, wounds that don't heal, pain or trouble when passing urine, general feeling of discomfort or  being unwell Kidney injury--decrease in the amount of urine, swelling of the ankles, hands, or feet Low red blood cell level--unusual weakness or fatigue, dizziness, headache, trouble breathing Painful swelling, warmth, or redness of the skin, blisters or sores at the infusion site Pain, tingling, or numbness in the hands or feet Unusual bruising or bleeding Side effects that usually do not require medical attention (report to your care team if they continue or are bothersome): Hair loss Nausea Vomiting This list may not describe all possible side effects. Call your doctor for medical advice about side effects. You may report side effects to FDA at 1-800-FDA-1088. Where should I keep my medication? This medication is given in a hospital or clinic. It will not be stored at home. NOTE: This sheet is a summary. It may not cover all possible information. If you have questions about this medicine, talk to your doctor, pharmacist, or health care provider.  2023 Elsevier/Gold Standard (2022-01-22 00:00:00)  Etoposide Injection What is this medication? ETOPOSIDE (e toe POE side) treats some types of cancer. It works by slowing down the growth of cancer cells. This medicine may be used for other purposes; ask your health care provider or pharmacist if you have questions. COMMON BRAND NAME(S): Etopophos, Toposar, VePesid What should I tell my care team before I take this medication? They need to know if you have any of these conditions: Infection Kidney disease Liver disease Low blood counts, such as low white cell, platelet, red cell counts An unusual or allergic reaction to etoposide, other medications, foods, dyes, or preservatives If you or your partner are pregnant or trying to get pregnant Breastfeeding How should I use this medication? This medication is injected into a vein. It is given by your care team in a hospital or clinic setting. Talk to your care team about the use of this  medication in children. Special care may be needed. Overdosage: If you think you have taken too much of this medicine contact a poison control center or emergency room at once. NOTE: This medicine is only for you. Do not share this medicine with others. What if I miss a dose? Keep appointments for follow-up doses. It is important not to miss your dose. Call your care team if you are unable to keep an appointment. What may interact with this medication? Warfarin This list may not describe all possible interactions. Give your health care provider a list of all the medicines, herbs, non-prescription drugs, or dietary supplements you use. Also tell them if you smoke, drink alcohol, or use illegal drugs. Some items may interact with your medicine. What should I watch for while using this medication? Your condition will be monitored carefully while you are receiving this medication. This medication may make you feel generally unwell. This is not uncommon as chemotherapy can affect healthy cells as well as cancer cells. Report any side effects. Continue your course of treatment even though you  feel ill unless your care team tells you to stop. This medication can cause serious side effects. To reduce the risk, your care team may give you other medications to take before receiving this one. Be sure to follow the directions from your care team. This medication may increase your risk of getting an infection. Call your care team for advice if you get a fever, chills, sore throat, or other symptoms of a cold or flu. Do not treat yourself. Try to avoid being around people who are sick. This medication may increase your risk to bruise or bleed. Call your care team if you notice any unusual bleeding. Talk to your care team about your risk of cancer. You may be more at risk for certain types of cancers if you take this medication. Talk to your care team if you may be pregnant. Serious birth defects can occur if you  take this medication during pregnancy and for 6 months after the last dose. You will need a negative pregnancy test before starting this medication. Contraception is recommended while taking this medication and for 6 months after the last dose. Your care team can help you find the option that works for you. If your partner can get pregnant, use a condom during sex while taking this medication and for 4 months after the last dose. Do not breastfeed while taking this medication. This medication may cause infertility. Talk to your care team if you are concerned about your fertility. What side effects may I notice from receiving this medication? Side effects that you should report to your care team as soon as possible: Allergic reactions--skin rash, itching, hives, swelling of the face, lips, tongue, or throat Infection--fever, chills, cough, sore throat, wounds that don't heal, pain or trouble when passing urine, general feeling of discomfort or being unwell Low red blood cell level--unusual weakness or fatigue, dizziness, headache, trouble breathing Unusual bruising or bleeding Side effects that usually do not require medical attention (report to your care team if they continue or are bothersome): Diarrhea Fatigue Hair loss Loss of appetite Nausea Vomiting This list may not describe all possible side effects. Call your doctor for medical advice about side effects. You may report side effects to FDA at 1-800-FDA-1088. Where should I keep my medication? This medication is given in a hospital or clinic. It will not be stored at home. NOTE: This sheet is a summary. It may not cover all possible information. If you have questions about this medicine, talk to your doctor, pharmacist, or health care provider.  2023 Elsevier/Gold Standard (2007-11-18 00:00:00)

## 2022-09-29 NOTE — Patient Instructions (Signed)

## 2022-09-29 NOTE — Progress Notes (Unsigned)
[place]   [phone] [phone]   Clinic Follow up Note   Patient Care Team: Merrilee Seashore, MD as PCP - General (Internal Medicine) Minus Breeding, MD as PCP - Cardiology (Cardiology) [name], MD as Consulting Physician (General Surgery) Truitt Merle, MD as Consulting Physician (Hematology) Kyung Rudd, MD as Consulting Physician (Radiation Oncology) Gardenia Phlegm, NP as Nurse Practitioner (Hematology and Oncology)  Date of Service:  09/30/2022  CHIEF COMPLAINT: f/u of  discuss lymph node biopsy results     CURRENT THERAPY:   Anastrozole 1 mg once daily starting 06/2017      ASSESSMENT:  Monica [name] is a 76 y.o. female with   Small cell lung cancer ([place]) -[id number], stage III, limited stage  -found on screening CT chest on July 22, 2022 which showed suprahilar left upper lobe pulmonary mass and mediastinal adenopathy -PET scan from August 08, 2022 showed hypermetabolic left hilar lung mass with mediastinal and left supraclavicular nodal metastasis. 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 Brain MRI negative  -started concurrent chemo cisplatin/etoposide and radiation 09/29/2022 -She tolerated first cycle chemotherapy well.  We again reviewed potential side effects, and management.   PLAN: -Discuss set on side effect from the chemo.  -encourage the pt to check BP and Glucose level, active -lab reviewed proceed with Day 2,Cycle1 Vepesid today -treatment 10/01/2022 -Lab and follow-up weekly   SUMMARY OF ONCOLOGIC HISTORY: Oncology History Overview Note  Cancer Staging Malignant neoplasm of upper-inner quadrant of left breast in female, estrogen receptor positive ([place]) Staging form: Breast, AJCC 8th Edition - Clinical stage from 02/16/2017: Stage IA (cT1b, cN0, cM0, G2, ER: Positive, PR: Positive, HER2: Negative) - Unsigned Staging comments: Staged at  breast conference  - Pathologic stage from 03/16/2017: Stage IA (pT1b, pN0, cM0, G2, ER: Positive, PR: Positive, HER2: Negative, Oncotype DX score: 27) - Signed by Truitt Merle, MD on 04/24/2017     Malignant neoplasm of upper-inner quadrant of left breast in female, estrogen receptor positive ([place])  02/09/2017 Initial Diagnosis   Malignant neoplasm of upper-inner quadrant of left breast in female, estrogen receptor positive ([place])   02/09/2017 Initial Biopsy   Diagnosis Breast, left, needle core biopsy - INVASIVE DUCTAL CARCINOMA, 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 Dalbert Batman    03/16/2017 Pathology Results   PATHOLOGY REPORT Diagnosis 03/16/17 1. Breast, lumpectomy, Left - INVASIVE DUCTAL CARCINOMA, NOTTINGHAM GRADE 2 OF 3, 0.9 CM - DUCTAL CARCINOMA IN SITU - MARGINS UNINVOLVED BY CARCINOMA (0.3 CM POSTERIOR MARGIN) - PREVIOUS BIOPSY SITE CHANGES - SEE ONCOLOGY TABLE BELOW 2. Lymph node, sentinel, biopsy, Left axillary #1 - NO CARCINOMA IDENTIFIED IN ONE LYMPH NODE (0/1) 3. Lymph node, sentinel, biopsy, Left axillary #2 - NO CARCINOMA IDENTIFIED IN ONE LYMPH NODE (0/1) 4. Lymph node, sentinel, biopsy, Left axillary #3 - NO CARCINOMA IDENTIFIED IN ONE LYMPH NODE (0/1) 5. Lymph node, sentinel, biopsy, Left axillary #4 - NO CARCINOMA IDENTIFIED IN ONE LYMPH NODE (0/1)    03/16/2017 Oncotype testing   Recurrance score of 27 with a 18% distance recurrance in the net 10 years with Tamoxifen alone    05/19/2017 - 06/16/2017  Radiation Therapy   Radiation with Dr. Lisbeth Renshaw   06/2017 -  Anti-estrogen oral therapy   anastrozole 1 mg once a day starting late 06/2017      04/29/2020 Pathology Results   DIAGNOSIS:   BONE MARROW, ASPIRATE, CLOT, CORE:  -  Cellular marrow with trilineage hematopoiesis  -  No morphologic evidence of carcinoma or hematopoietic neoplasm  -  See microscopic description below   PERIPHERAL BLOOD:  -  Polycythemia and thrombocytopenia  -  See complete blood cell count   MICROSCOPIC DESCRIPTION:   PERIPHERAL BLOOD SMEAR: The peripheral blood has a polycythemia and  thrombocytopenia.  Leukocytes are morphologically unremarkable.   BONE MARROW ASPIRATE: Spicular, cellular and adequate for evaluation  Erythroid precursors: Orderly maturation without overt dysplasia  Granulocytic precursors: Orderly maturation without overt dysplasia  Megakaryocytes: Qualitatively and quantitatively unremarkable  Lymphocytes/plasma cells: No lymphocytosis or plasmacytosis    Small cell lung cancer ([place])  09/14/2022 Cancer Staging   Staging form: Lung, AJCC 8th Edition - Clinical stage from 09/14/2022: Stage IIIB (cT2, cN3, cM0) - Signed by Truitt Merle, MD on 09/16/2022   09/16/2022 Initial Diagnosis   Small cell lung cancer ([place])   09/29/2022 -  Chemotherapy   Patient is on Treatment Plan : LUNG SMALL CELL Cisplatin (80) D1 + Etoposide (100) D1-3 q21d        INTERVAL HISTORY:  Monica [name] is here for a follow up of  discuss lymph node biopsy results   She was last seen by me on 09/16/22 She presents to the clinic accompanied by daughter. Pt states she did well with her first treatment.      MEDICAL HISTORY:  Past Medical History:  Diagnosis Date   CAD (coronary artery disease)    Cypher stent 09/2002   Cancer ([place]) 2018   left breast, radiation therapy   CHF (congestive heart failure) ([place])    Depression    Diabetes mellitus ([place])    Hyperlipidemia    Hypertension    Myocardial infarction ([place])    2003,went into cardiac shock   Osteoporosis    Seizures ([place])    1st seizure age late 39's; most recent 07/28/17   Sinusitis      SURGICAL HISTORY: Past Surgical History:  Procedure Laterality Date   ABDOMINAL HYSTERECTOMY     APPENDECTOMY     BREAST LUMPECTOMY WITH RADIOACTIVE SEED AND SENTINEL LYMPH NODE BIOPSY Left 03/16/2017   Procedure: INJECT BLUE DYE LEFT BREAST, LEFT BREAST LUMPECTOMY WITH RADIOACTIVE SEED AND LEFT AXILLARY DEEP SENTINEL LYMPH NODE  BIOPSY ADJACENT TISSUE TRANSFER;  Surgeon: [name], MD;  Location: [facility];  Service: General;  Laterality: Left;   CARPAL TUNNEL RELEASE Bilateral    COLONOSCOPY WITH PROPOFOL N/A 05/30/2015   Procedure: COLONOSCOPY WITH PROPOFOL;  Surgeon: Carol Ada, MD;  Location: [facility];  Service: Endoscopy;  Laterality: N/A;   COLONOSCOPY WITH PROPOFOL N/A 06/09/2018   Procedure: COLONOSCOPY WITH PROPOFOL;  Surgeon: Carol Ada, MD;  Location: [facility];  Service: Endoscopy;  Laterality: N/A;   COLONOSCOPY WITH PROPOFOL N/A 07/31/2021   Procedure: COLONOSCOPY WITH PROPOFOL;  Surgeon: Carol Ada, MD;  Location: [facility];  Service: Endoscopy;  Laterality: N/A;   CORONARY ANGIOPLASTY WITH STENT PLACEMENT  10-02-2002   ESOPHAGOGASTRODUODENOSCOPY (EGD) WITH PROPOFOL N/A 07/31/2021   Procedure: ESOPHAGOGASTRODUODENOSCOPY (EGD) WITH PROPOFOL;  Surgeon: Carol Ada, MD;  Location: [facility];  Service: Endoscopy;  Laterality: N/A;   Exploratory abdominal     Bleeding in abdomen after tubal  IR IMAGING GUIDED PORT INSERTION  09/28/2022   POLYPECTOMY  06/09/2018   Procedure: POLYPECTOMY;  Surgeon: Carol Ada, MD;  Location: [facility];  Service: Endoscopy;;   POLYPECTOMY  07/31/2021   Procedure: POLYPECTOMY;  Surgeon: Carol Ada, MD;  Location: [facility];  Service: Endoscopy;;   TUBAL LIGATION     UMBILICAL HERNIA REPAIR      I have reviewed the social history and family history with the patient and they are unchanged from previous note.  ALLERGIES:  is allergic to ace inhibitors, keflex [cephalexin], phenobarbital, sulfate, altace  [ramipril], codeine, and dicyclomine hcl.  MEDICATIONS:  Current Outpatient Medications  Medication Sig Dispense Refill   acetaminophen (TYLENOL) 500 MG tablet Take 1,000 mg by mouth every 6 (six) hours as needed for moderate pain or headache.     albuterol (VENTOLIN HFA) 108 (90 Base) MCG/ACT inhaler INHALE 2 PUFFS BY MOUTH EVERY 6 HOURS AS NEEDED FOR WHEEZING OR  SHORTHNESS  OF  BREATH 9 g 11   anastrozole (ARIMIDEX) 1 MG tablet Take 1 tablet (1 mg total) by mouth daily. 90 tablet 1   aspirin EC 81 MG tablet Take 81 mg by mouth at bedtime.     atorvastatin (LIPITOR) 40 MG tablet Take 40 mg by mouth at bedtime.     B-D ULTRAFINE III SHORT PEN 31G X 8 MM MISC Inject into the skin as directed.     Calcium Carbonate-Vitamin D (CALTRATE 600+D PO) Take 1 tablet by mouth in the morning and at bedtime.     carvedilol (COREG) 3.125 MG tablet Take 3.125 mg by mouth 2 (two) times daily.      cetirizine (ZYRTEC) 10 MG tablet Take 10 mg by mouth daily as needed for allergies.     clonazePAM (KLONOPIN) 1 MG tablet Take 1 mg by mouth in the morning and at bedtime.     Fluticasone-Umeclidin-Vilant (TRELEGY ELLIPTA) 100-62.5-25 MCG/ACT AEPB INHALE 1 PUFF ONCE DAILY RINSE  MOUTH  AFTER 60 each 11   Insulin Detemir (LEVEMIR) 100 UNIT/ML Pen Inject 17-25 Units into the skin See admin instructions. Inject 25 units subcutaneously in the morning and inject 17 units subcutaneously at night     insulin lispro (HUMALOG) 100 UNIT/ML KwikPen Inject 7-9 Units into the skin See admin instructions. Inject 7 units in the morning, 7 units at lunch, and 9 units at night     ketorolac (ACULAR) 0.5 % ophthalmic solution Place 1 drop into the left eye 4 (four) times daily.     levETIRAcetam (KEPPRA) 500 MG tablet Take 1 tablet (500 mg total) by mouth 2 (two) times daily. 180 tablet 3   lidocaine-prilocaine (EMLA) cream Apply to affected area once 30 g 3   losartan (COZAAR) 50 MG tablet Take 25 mg by mouth at bedtime.      moxifloxacin (VIGAMOX) 0.5 % ophthalmic solution Place 1 drop into the left eye 4 (four) times daily.     nitroGLYCERIN (NITROSTAT) 0.4 MG SL tablet Place 1 tablet (0.4 mg total) under the tongue every 5 (five) minutes as needed for chest pain. 25 tablet 2   Omega-3 Fatty Acids (FISH OIL) 1000 MG CAPS Take 1,000 mg by mouth in the morning and at bedtime.     omeprazole (PRILOSEC) 20 MG capsule Take 20 mg by mouth in the morning.     ondansetron (ZOFRAN) 8 MG tablet Take 1 tablet (8 mg total) by mouth every 8 (eight) hours as needed for nausea or vomiting. Start on the  third day after cisplatin. 30 tablet 1   ONETOUCH VERIO test strip USE THREE TIMES A DAY AND AS NEEDED FOR HYPOGLYCEMIA     OXYGEN Inhale 2 L into the lungs at bedtime.     pioglitazone (ACTOS) 15 MG tablet Take 15 mg by mouth at bedtime.     Polyethyl Glycol-Propyl Glycol 0.4-0.3 % SOLN Place 1 drop into both eyes 2 (two) times daily as needed (itchy/irritated eyes.).     prednisoLONE acetate (PRED FORTE) 1 % ophthalmic suspension Place 1 drop into the left eye 4 (four) times daily.     prochlorperazine (COMPAZINE) 10 MG tablet Take 1 tablet (10 mg total) by mouth every 6 (six) hours as needed for nausea or vomiting. 30 tablet 1   risedronate (ACTONEL) 150 MG tablet Take 150 mg by mouth every 30 (thirty) days.     sertraline (ZOLOFT) 100 MG tablet Take 100 mg by mouth at bedtime.     TRIJARDY XR 12.5-2.02-999 MG TB24 Take 1 tablet by mouth 2 (two) times daily.     No current facility-administered medications for this visit.   Facility-Administered Medications Ordered in Other Visits  Medication Dose Route Frequency Provider Last Rate Last Admin   etoposide (VEPESID) 150 mg in sodium chloride 0.9 % 500 mL chemo infusion  100 mg/m2 (Treatment Plan Recorded) Intravenous Once Truitt Merle, MD       heparin lock flush 100 unit/mL  500 Units Intracatheter Once PRN Truitt Merle, MD       sodium chloride flush (NS) 0.9 % injection 10 mL  10 mL  Intracatheter PRN Truitt Merle, MD        PHYSICAL EXAMINATION: ECOG PERFORMANCE STATUS: 1 - Symptomatic but completely ambulatory  Vitals:   09/30/22 0819  BP: (!) 115/59  Pulse: 65  Resp: (!) 118  Temp: (!) 97.5 F (36.4 C)  SpO2: 96%   Wt Readings from Last 3 Encounters:  09/30/22 107 lb 12.8 oz (48.9 kg)  09/29/22 107 lb 8 oz (48.8 kg)  09/28/22 104 lb 6.4 oz (47.4 kg)     GENERAL:alert, no distress and comfortable SKIN: skin color normal, no rashes or significant lesions EYES: normal, Conjunctiva are pink and non-injected, sclera clear  NEURO: alert & oriented x 3 with fluent speech LABORATORY DATA:  I have reviewed the data as listed    Latest Ref Rng & Units 09/29/2022    7:56 AM 09/08/2022   10:48 AM 03/03/2022   12:11 PM  CBC  WBC 4.0 - 10.5 K/uL 5.1  5.6  6.1   Hemoglobin 12.0 - 15.0 g/dL 13.6  15.1  16.4   Hematocrit 36.0 - 46.0 % 42.4  47.3  50.8   Platelets 150 - 400 K/uL 135  161  145         Latest Ref Rng & Units 09/29/2022    7:56 AM 09/08/2022   10:48 AM 03/03/2022   12:11 PM  CMP  Glucose 70 - 99 mg/dL 117  130  156   BUN 8 - 23 mg/dL _0 Creatinine 0.44 - 1.00 mg/dL 0.45  0.57  0.61   Sodium 135 - 145 mmol/L 136  137  139   Potassium 3.5 - 5.1 mmol/L 3.8  4.3  4.3   Chloride 98 - 111 mmol/L 104  102  104   CO2 22 - 32 mmol/L _1 Calcium 8.9 - 10.3 mg/dL 9.0  10.1  9.6   Total Protein 6.5 - 8.1 g/dL 7.0  7.7  7.5   Total Bilirubin 0.3 - 1.2 mg/dL 0.6  0.4  0.4   Alkaline Phos 38 - 126 U/L 42  57  73   AST 15 - 41 U/L _0 ALT 0 - 44 U/L _1 RADIOGRAPHIC STUDIES: I have personally reviewed the radiological images as listed and agreed with the findings in the report. IR IMAGING GUIDED PORT INSERTION  Result Date: 09/28/2022 INDICATION: Small-cell lung cancer EXAM: IMPLANTED PORT A CATH PLACEMENT WITH ULTRASOUND AND FLUOROSCOPIC GUIDANCE MEDICATIONS: Fentanyl IV 100 mcg ANESTHESIA/SEDATION: None  FLUOROSCOPY: Radiation Exposure Index (as provided by the fluoroscopic device): 1 mGy Kerma COMPLICATIONS: None immediate. PROCEDURE: The procedure, risks, benefits, and alternatives were explained to the patient. Questions regarding the procedure were encouraged and answered. The patient understands and consents to the procedure. A timeout was performed prior to the initiation of the procedure. Patient positioned supine on the angiography table. Right neck and anterior upper chest prepped and draped in the usual sterile fashion. All elements of maximal sterile barrier were utilized including, cap, mask, sterile gown, sterile gloves, large sterile drape, hand scrubbing and 2% Chlorhexidine for skin cleaning. The right internal jugular vein was evaluated with ultrasound and shown to be patent. A permanent ultrasound image was obtained and placed in the patient's medical record. Local anesthesia was provided with 1% lidocaine with epinephrine. Using sterile gel and a sterile probe cover, the right internal jugular vein was entered with a 21 ga needle during real time ultrasound guidance. 0.018 inch guidewire placed and 21 ga needle exchanged for transitional dilator set. Utilizing fluoroscopy, 0.035 inch guidewire advanced centrally without difficulty. Attention then turned to the right anterior upper chest. Following local lidocaine administration, a port pocket was created. The catheter was connected to the port and brought from the pocket to the venotomy site through a subcutaneous tunnel. The catheter was cut to size and inserted through the peel-away sheath. The catheter tip was positioned at the cavoatrial junction using fluoroscopic guidance. The port aspirated and flushed well. The port pocket was closed with deep and superficial absorbable suture. The port pocket incision and venotomy sites were also sealed with Dermabond. IMPRESSION: Successful placement of a right internal jugular approach power injectable  Port-A-Cath. The catheter is ready for immediate use. Electronically Signed   By: Miachel Roux M.D.   On: 09/28/2022 14:04      No orders of the defined types were placed in this encounter.  All questions were answered. The patient knows to call the clinic with any problems, questions or concerns. No barriers to learning was detected. The total time spent in the appointment was 30 minutes.     Truitt Merle, MD 09/30/2022   Felicity Coyer, CMA, am acting as scribe for Truitt Merle, MD.   I have reviewed the above documentation for accuracy and completeness, and I agree with the above.   \

## 2022-09-30 ENCOUNTER — Ambulatory Visit
Admission: RE | Admit: 2022-09-30 | Discharge: 2022-09-30 | Disposition: A | Discharge status: Home / Self Care | Payer: Medicare Other | Source: Ambulatory Visit | Attending: Radiation Oncology | Admitting: Radiation Oncology

## 2022-09-30 ENCOUNTER — Inpatient Hospital Stay (HOSPITAL_BASED_OUTPATIENT_CLINIC_OR_DEPARTMENT_OTHER): Payer: Medicare Other | Admitting: Hematology

## 2022-09-30 ENCOUNTER — Inpatient Hospital Stay: Payer: Medicare Other

## 2022-09-30 ENCOUNTER — Ambulatory Visit (HOSPITAL_COMMUNITY): Payer: Medicare Other

## 2022-09-30 ENCOUNTER — Other Ambulatory Visit: Payer: Self-pay

## 2022-09-30 ENCOUNTER — Encounter: Payer: Self-pay | Admitting: Hematology

## 2022-09-30 VITALS — BP 133/62 | HR 68 | Temp 98.1°F | Resp 16

## 2022-09-30 VITALS — BP 115/59 | HR 65 | Temp 97.5°F | Resp 118 | Ht 65.0 in | Wt 107.8 lb

## 2022-09-30 DIAGNOSIS — C50912 Malignant neoplasm of unspecified site of left female breast: Secondary | ICD-10-CM | POA: Diagnosis not present

## 2022-09-30 DIAGNOSIS — Z170 Estrogen receptor positive status [ER+]: Secondary | ICD-10-CM | POA: Diagnosis not present

## 2022-09-30 DIAGNOSIS — C3490 Malignant neoplasm of unspecified part of unspecified bronchus or lung: Secondary | ICD-10-CM

## 2022-09-30 DIAGNOSIS — Z79811 Long term (current) use of aromatase inhibitors: Secondary | ICD-10-CM | POA: Diagnosis not present

## 2022-09-30 DIAGNOSIS — C3412 Malignant neoplasm of upper lobe, left bronchus or lung: Secondary | ICD-10-CM | POA: Diagnosis not present

## 2022-09-30 DIAGNOSIS — Z5111 Encounter for antineoplastic chemotherapy: Secondary | ICD-10-CM | POA: Diagnosis not present

## 2022-09-30 DIAGNOSIS — Z510 Encounter for antineoplastic radiation therapy: Secondary | ICD-10-CM | POA: Diagnosis not present

## 2022-09-30 DIAGNOSIS — Z452 Encounter for adjustment and management of vascular access device: Secondary | ICD-10-CM | POA: Diagnosis not present

## 2022-09-30 DIAGNOSIS — D751 Secondary polycythemia: Secondary | ICD-10-CM | POA: Diagnosis not present

## 2022-09-30 DIAGNOSIS — D696 Thrombocytopenia, unspecified: Secondary | ICD-10-CM | POA: Diagnosis not present

## 2022-09-30 DIAGNOSIS — Z87891 Personal history of nicotine dependence: Secondary | ICD-10-CM | POA: Diagnosis not present

## 2022-09-30 LAB — RAD ONC ARIA SESSION SUMMARY
Course Elapsed Days: 2
Plan Fractions Treated to Date: 3
Plan Prescribed Dose Per Fraction: 2 Gy
Plan Total Fractions Prescribed: 33
Plan Total Prescribed Dose: 66 Gy
Reference Point Dosage Given to Date: 6 Gy
Reference Point Session Dosage Given: 2 Gy
Session Number: 3

## 2022-09-30 MED ORDER — SODIUM CHLORIDE 0.9 % IV SOLN: Freq: Once | INTRAVENOUS | Status: AC

## 2022-09-30 MED ORDER — HEPARIN SOD (PORK) LOCK FLUSH 100 UNIT/ML IV SOLN
500.0000 [IU] | Freq: Once | INTRAVENOUS | Status: AC | PRN
  Administered 2022-09-30: 500 [IU]

## 2022-09-30 MED ORDER — SODIUM CHLORIDE 0.9 % IV SOLN
10.0000 mg | Freq: Once | INTRAVENOUS | Status: AC
  Administered 2022-09-30: 10 mg via INTRAVENOUS
  Filled 2022-09-30: qty 1
  Filled 2022-09-30: qty 10

## 2022-09-30 MED ORDER — SODIUM CHLORIDE 0.9% FLUSH
10.0000 mL | INTRAVENOUS | Status: DC | PRN
  Administered 2022-09-30: 10 mL

## 2022-09-30 MED ORDER — SODIUM CHLORIDE 0.9 % IV SOLN
100.0000 mg/m2 | Freq: Once | INTRAVENOUS | Status: AC
  Administered 2022-09-30: 150 mg via INTRAVENOUS
  Filled 2022-09-30: qty 7.5

## 2022-09-30 MED FILL — Dexamethasone Sodium Phosphate Inj 100 MG/10ML: INTRAMUSCULAR | Qty: 1 | Status: AC

## 2022-09-30 NOTE — Patient Instructions (Signed)
[facility] MEDICAL [facility]COLOG[facility]  Discharge Instructions: Thank you for choosing [facility] to provide your oncology and hematology care.   If you have a lab appointment with the Cancer Center, please go directly to the Cancer Center and check in at the registration area.   Wear comfortable clothing and clothing appropriate for easy access to any Portacath or PICC line.   We strive to give you quality time with your provider. You may need to reschedule your appointment if you arrive late (15 or more minutes).  Arriving late affects you and other patients whose appointments are after yours.  Also, if you miss three or more appointments without notifying the office, you may be dismissed from the clinic at the provider's discretion.      For prescription refill requests, have your pharmacy contact our office and allow 72 hours for refills to be completed.    Today you received the following chemotherapy and/or immunotherapy agents: Etoposide      To help prevent nausea and vomiting after your treatment, we encourage you to take your nausea medication as directed.  BELOW ARE SYMPTOMS THAT SHOULD BE REPORTED IMMEDIATELY: *FEVER GREATER THAN 100.4 F (38 C) OR HIGHER *CHILLS OR SWEATING *NAUSEA AND VOMITING THAT IS NOT CONTROLLED WITH YOUR NAUSEA MEDICATION *UNUSUAL SHORTNESS OF BREATH *UNUSUAL BRUISING OR BLEEDING *URINARY PROBLEMS (pain or burning when urinating, or frequent urination) *BOWEL PROBLEMS (unusual diarrhea, constipation, pain near the anus) TENDERNESS IN MOUTH AND THROAT WITH OR WITHOUT PRESENCE OF ULCERS (sore throat, sores in mouth, or a toothache) UNUSUAL RASH, SWELLING OR PAIN  UNUSUAL VAGINAL DISCHARGE OR ITCHING   Items with * indicate a potential emergency and should be followed up as soon as possible or go to the Emergency Department if any problems should occur.  Please show the CHEMOTHERAPY ALERT CARD or IMMUNOTHERAPY ALERT CARD at check-in to  the Emergency Department and triage nurse.  Should you have questions after your visit or need to cancel or reschedule your appointment, please contact [facility]COL[facility]  Dept: [phone]  and follow the prompts.  Office hours are 8:00 a.m. to 4:30 p.m. Monday - Friday. Please note that voicemails left after 4:00 p.m. may not be returned until the following business day.  We are closed weekends and major holidays. You have access to a nurse at all times for urgent questions. Please call the main number to the clinic Dept: [phone] and follow the prompts.   For any non-urgent questions, you may also contact your provider using MyChart. We now offer e-Visits for anyone 18 and older to request care online for non-urgent symptoms. For details visit [email or web address].   Also download the MyChart app! Go to the app store, search "MyChart", open the app, select [facility], and log in with your MyChart username and password.  Masks are optional in the cancer centers. If you would like for your care team to wear a mask while they are taking care of you, please let them know. You may have one support person who is at least 76 years old accompany you for your appointments. 

## 2022-10-01 ENCOUNTER — Inpatient Hospital Stay: Payer: Medicare Other

## 2022-10-01 ENCOUNTER — Other Ambulatory Visit: Payer: Self-pay

## 2022-10-01 ENCOUNTER — Ambulatory Visit: Payer: Medicare Other | Admitting: Emergency Medicine

## 2022-10-01 ENCOUNTER — Ambulatory Visit
Admission: RE | Admit: 2022-10-01 | Discharge: 2022-10-01 | Disposition: A | Discharge status: Home / Self Care | Payer: Medicare Other | Source: Ambulatory Visit | Attending: Radiation Oncology | Admitting: Radiation Oncology

## 2022-10-01 VITALS — BP 134/67 | HR 59 | Temp 97.6°F | Resp 18 | Wt 103.8 lb

## 2022-10-01 DIAGNOSIS — C3490 Malignant neoplasm of unspecified part of unspecified bronchus or lung: Secondary | ICD-10-CM

## 2022-10-01 DIAGNOSIS — Z510 Encounter for antineoplastic radiation therapy: Secondary | ICD-10-CM | POA: Diagnosis not present

## 2022-10-01 DIAGNOSIS — D696 Thrombocytopenia, unspecified: Secondary | ICD-10-CM | POA: Diagnosis not present

## 2022-10-01 DIAGNOSIS — Z452 Encounter for adjustment and management of vascular access device: Secondary | ICD-10-CM | POA: Diagnosis not present

## 2022-10-01 DIAGNOSIS — Z5111 Encounter for antineoplastic chemotherapy: Secondary | ICD-10-CM | POA: Diagnosis not present

## 2022-10-01 DIAGNOSIS — Z87891 Personal history of nicotine dependence: Secondary | ICD-10-CM | POA: Diagnosis not present

## 2022-10-01 DIAGNOSIS — C3412 Malignant neoplasm of upper lobe, left bronchus or lung: Secondary | ICD-10-CM | POA: Diagnosis not present

## 2022-10-01 DIAGNOSIS — D751 Secondary polycythemia: Secondary | ICD-10-CM | POA: Diagnosis not present

## 2022-10-01 DIAGNOSIS — Z79811 Long term (current) use of aromatase inhibitors: Secondary | ICD-10-CM | POA: Diagnosis not present

## 2022-10-01 DIAGNOSIS — Z170 Estrogen receptor positive status [ER+]: Secondary | ICD-10-CM | POA: Diagnosis not present

## 2022-10-01 DIAGNOSIS — C50912 Malignant neoplasm of unspecified site of left female breast: Secondary | ICD-10-CM | POA: Diagnosis not present

## 2022-10-01 LAB — RAD ONC ARIA SESSION SUMMARY
Course Elapsed Days: 3
Plan Fractions Treated to Date: 4
Plan Prescribed Dose Per Fraction: 2 Gy
Plan Total Fractions Prescribed: 33
Plan Total Prescribed Dose: 66 Gy
Reference Point Dosage Given to Date: 8 Gy
Reference Point Session Dosage Given: 2 Gy
Session Number: 4

## 2022-10-01 MED ORDER — SODIUM CHLORIDE 0.9 % IV SOLN
10.0000 mg | Freq: Once | INTRAVENOUS | Status: AC
  Administered 2022-10-01: 10 mg via INTRAVENOUS
  Filled 2022-10-01: qty 10

## 2022-10-01 MED ORDER — SODIUM CHLORIDE 0.9 % IV SOLN
100.0000 mg/m2 | Freq: Once | INTRAVENOUS | Status: AC
  Administered 2022-10-01: 150 mg via INTRAVENOUS
  Filled 2022-10-01: qty 7.5

## 2022-10-01 MED ORDER — SODIUM CHLORIDE 0.9% FLUSH
10.0000 mL | INTRAVENOUS | Status: DC | PRN
  Administered 2022-10-01: 10 mL

## 2022-10-01 MED ORDER — SODIUM CHLORIDE 0.9 % IV SOLN: Freq: Once | INTRAVENOUS | Status: AC

## 2022-10-01 MED ORDER — HEPARIN SOD (PORK) LOCK FLUSH 100 UNIT/ML IV SOLN
500.0000 [IU] | Freq: Once | INTRAVENOUS | Status: AC | PRN
  Administered 2022-10-01: 500 [IU]

## 2022-10-01 NOTE — Patient Instructions (Signed)
[facility]COL[facility]  Discharge Instructions: Thank you for choosing [facility] to provide your oncology and hematology care.   If you have a lab appointment with the Cancer Center, please go directly to the Cancer Center and check in at the registration area.   Wear comfortable clothing and clothing appropriate for easy access to any Portacath or PICC line.   We strive to give you quality time with your provider. You may need to reschedule your appointment if you arrive late (15 or more minutes).  Arriving late affects you and other patients whose appointments are after yours.  Also, if you miss three or more appointments without notifying the office, you may be dismissed from the clinic at the provider's discretion.      For prescription refill requests, have your pharmacy contact our office and allow 72 hours for refills to be completed.    Today you received the following chemotherapy and/or immunotherapy agents: Etoposide      To help prevent nausea and vomiting after your treatment, we encourage you to take your nausea medication as directed.  BELOW ARE SYMPTOMS THAT SHOULD BE REPORTED IMMEDIATELY: *FEVER GREATER THAN 100.4 F (38 C) OR HIGHER *CHILLS OR SWEATING *NAUSEA AND VOMITING THAT IS NOT CONTROLLED WITH YOUR NAUSEA MEDICATION *UNUSUAL SHORTNESS OF BREATH *UNUSUAL BRUISING OR BLEEDING *URINARY PROBLEMS (pain or burning when urinating, or frequent urination) *BOWEL PROBLEMS (unusual diarrhea, constipation, pain near the anus) TENDERNESS IN MOUTH AND THROAT WITH OR WITHOUT PRESENCE OF ULCERS (sore throat, sores in mouth, or a toothache) UNUSUAL RASH, SWELLING OR PAIN  UNUSUAL VAGINAL DISCHARGE OR ITCHING   Items with * indicate a potential emergency and should be followed up as soon as possible or go to the Emergency Department if any problems should occur.  Please show the CHEMOTHERAPY ALERT CARD or IMMUNOTHERAPY ALERT CARD at check-in to  the Emergency Department and triage nurse.  Should you have questions after your visit or need to cancel or reschedule your appointment, please contact [facility]  Dept: [phone]  and follow the prompts.  Office hours are 8:00 a.m. to 4:30 p.m. Monday - Friday. Please note that voicemails left after 4:00 p.m. may not be returned until the following business day.  We are closed weekends and major holidays. You have access to a nurse at all times for urgent questions. Please call the main number to the clinic Dept: [phone] and follow the prompts.   For any non-urgent questions, you may also contact your provider using MyChart. We now offer e-Visits for anyone 18 and older to request care online for non-urgent symptoms. For details visit [email or web address].   Also download the MyChart app! Go to the app store, search "MyChart", open the app, select [facility], and log in with your MyChart username and password.  Masks are optional in the cancer centers. If you would like for your care team to wear a mask while they are taking care of you, please let them know. You may have one support person who is at least 76 years old accompany you for your appointments. 

## 2022-10-05 ENCOUNTER — Other Ambulatory Visit: Payer: Self-pay

## 2022-10-05 ENCOUNTER — Telehealth: Payer: Self-pay

## 2022-10-05 ENCOUNTER — Ambulatory Visit
Admission: RE | Admit: 2022-10-05 | Discharge: 2022-10-05 | Disposition: A | Discharge status: Home / Self Care | Payer: Medicare Other | Source: Ambulatory Visit | Attending: Radiation Oncology | Admitting: Radiation Oncology

## 2022-10-05 DIAGNOSIS — Z510 Encounter for antineoplastic radiation therapy: Secondary | ICD-10-CM | POA: Diagnosis not present

## 2022-10-05 DIAGNOSIS — Z5111 Encounter for antineoplastic chemotherapy: Secondary | ICD-10-CM | POA: Diagnosis not present

## 2022-10-05 DIAGNOSIS — Z87891 Personal history of nicotine dependence: Secondary | ICD-10-CM | POA: Diagnosis not present

## 2022-10-05 DIAGNOSIS — C3412 Malignant neoplasm of upper lobe, left bronchus or lung: Secondary | ICD-10-CM | POA: Diagnosis not present

## 2022-10-05 DIAGNOSIS — C50912 Malignant neoplasm of unspecified site of left female breast: Secondary | ICD-10-CM | POA: Diagnosis not present

## 2022-10-05 DIAGNOSIS — Z452 Encounter for adjustment and management of vascular access device: Secondary | ICD-10-CM | POA: Diagnosis not present

## 2022-10-05 DIAGNOSIS — D751 Secondary polycythemia: Secondary | ICD-10-CM | POA: Diagnosis not present

## 2022-10-05 DIAGNOSIS — D696 Thrombocytopenia, unspecified: Secondary | ICD-10-CM | POA: Diagnosis not present

## 2022-10-05 DIAGNOSIS — Z79811 Long term (current) use of aromatase inhibitors: Secondary | ICD-10-CM | POA: Diagnosis not present

## 2022-10-05 DIAGNOSIS — Z170 Estrogen receptor positive status [ER+]: Secondary | ICD-10-CM | POA: Diagnosis not present

## 2022-10-05 LAB — RAD ONC ARIA SESSION SUMMARY
Course Elapsed Days: 7
Plan Fractions Treated to Date: 5
Plan Prescribed Dose Per Fraction: 2 Gy
Plan Total Fractions Prescribed: 33
Plan Total Prescribed Dose: 66 Gy
Reference Point Dosage Given to Date: 10 Gy
Reference Point Session Dosage Given: 2 Gy
Session Number: 5

## 2022-10-05 NOTE — Telephone Encounter (Signed)
Ms Sefcik states that she is doing fine. She had 1 bout of nausea/vomiting  on Saturday 10-02-22.  She used her nausea medication with good effect.  She is eating, drinking, and urinating well. She knows to call the office at [phone] if she has any questions or concerns.

## 2022-10-05 NOTE — Telephone Encounter (Signed)
-----   Message from Tildon Husky, RN sent at 09/29/2022  3:28 PM EST ----- Regarding: first time treatment call back-feng etoposide/ Cisplatin Patient received treatment for the first time she is followed by Dr Burr Medico. She tolerated treatment well with no issues. She received etoposide/ cisplatin

## 2022-10-06 ENCOUNTER — Other Ambulatory Visit: Payer: Self-pay

## 2022-10-06 ENCOUNTER — Other Ambulatory Visit: Payer: Medicare Other

## 2022-10-06 ENCOUNTER — Ambulatory Visit
Admission: RE | Admit: 2022-10-06 | Discharge: 2022-10-06 | Disposition: A | Discharge status: Home / Self Care | Payer: Medicare Other | Source: Ambulatory Visit | Attending: Radiation Oncology | Admitting: Radiation Oncology

## 2022-10-06 DIAGNOSIS — C50912 Malignant neoplasm of unspecified site of left female breast: Secondary | ICD-10-CM | POA: Diagnosis not present

## 2022-10-06 DIAGNOSIS — Z79811 Long term (current) use of aromatase inhibitors: Secondary | ICD-10-CM | POA: Diagnosis not present

## 2022-10-06 DIAGNOSIS — Z452 Encounter for adjustment and management of vascular access device: Secondary | ICD-10-CM | POA: Diagnosis not present

## 2022-10-06 DIAGNOSIS — D751 Secondary polycythemia: Secondary | ICD-10-CM | POA: Diagnosis not present

## 2022-10-06 DIAGNOSIS — Z5111 Encounter for antineoplastic chemotherapy: Secondary | ICD-10-CM | POA: Diagnosis not present

## 2022-10-06 DIAGNOSIS — D696 Thrombocytopenia, unspecified: Secondary | ICD-10-CM | POA: Diagnosis not present

## 2022-10-06 DIAGNOSIS — Z510 Encounter for antineoplastic radiation therapy: Secondary | ICD-10-CM | POA: Diagnosis not present

## 2022-10-06 DIAGNOSIS — Z170 Estrogen receptor positive status [ER+]: Secondary | ICD-10-CM | POA: Diagnosis not present

## 2022-10-06 DIAGNOSIS — C3412 Malignant neoplasm of upper lobe, left bronchus or lung: Secondary | ICD-10-CM | POA: Diagnosis not present

## 2022-10-06 DIAGNOSIS — Z87891 Personal history of nicotine dependence: Secondary | ICD-10-CM | POA: Diagnosis not present

## 2022-10-06 LAB — RAD ONC ARIA SESSION SUMMARY
Course Elapsed Days: 8
Plan Fractions Treated to Date: 6
Plan Prescribed Dose Per Fraction: 2 Gy
Plan Total Fractions Prescribed: 33
Plan Total Prescribed Dose: 66 Gy
Reference Point Dosage Given to Date: 12 Gy
Reference Point Session Dosage Given: 2 Gy
Session Number: 6

## 2022-10-07 ENCOUNTER — Other Ambulatory Visit: Payer: Self-pay

## 2022-10-07 ENCOUNTER — Ambulatory Visit: Payer: Medicare Other | Admitting: Oncology

## 2022-10-07 ENCOUNTER — Other Ambulatory Visit: Payer: Medicare Other

## 2022-10-07 ENCOUNTER — Ambulatory Visit
Admission: RE | Admit: 2022-10-07 | Discharge: 2022-10-07 | Disposition: A | Discharge status: Home / Self Care | Payer: Medicare Other | Source: Ambulatory Visit | Attending: Radiation Oncology | Admitting: Radiation Oncology

## 2022-10-07 DIAGNOSIS — C3412 Malignant neoplasm of upper lobe, left bronchus or lung: Secondary | ICD-10-CM | POA: Diagnosis not present

## 2022-10-07 DIAGNOSIS — Z87891 Personal history of nicotine dependence: Secondary | ICD-10-CM | POA: Diagnosis not present

## 2022-10-07 DIAGNOSIS — Z170 Estrogen receptor positive status [ER+]: Secondary | ICD-10-CM | POA: Diagnosis not present

## 2022-10-07 DIAGNOSIS — D696 Thrombocytopenia, unspecified: Secondary | ICD-10-CM | POA: Diagnosis not present

## 2022-10-07 DIAGNOSIS — Z5111 Encounter for antineoplastic chemotherapy: Secondary | ICD-10-CM | POA: Diagnosis not present

## 2022-10-07 DIAGNOSIS — Z79811 Long term (current) use of aromatase inhibitors: Secondary | ICD-10-CM | POA: Diagnosis not present

## 2022-10-07 DIAGNOSIS — D751 Secondary polycythemia: Secondary | ICD-10-CM | POA: Diagnosis not present

## 2022-10-07 DIAGNOSIS — Z510 Encounter for antineoplastic radiation therapy: Secondary | ICD-10-CM | POA: Diagnosis not present

## 2022-10-07 DIAGNOSIS — C50912 Malignant neoplasm of unspecified site of left female breast: Secondary | ICD-10-CM | POA: Diagnosis not present

## 2022-10-07 DIAGNOSIS — Z452 Encounter for adjustment and management of vascular access device: Secondary | ICD-10-CM | POA: Diagnosis not present

## 2022-10-07 LAB — RAD ONC ARIA SESSION SUMMARY
Course Elapsed Days: 9
Plan Fractions Treated to Date: 7
Plan Prescribed Dose Per Fraction: 2 Gy
Plan Total Fractions Prescribed: 33
Plan Total Prescribed Dose: 66 Gy
Reference Point Dosage Given to Date: 14 Gy
Reference Point Session Dosage Given: 2 Gy
Session Number: 7

## 2022-10-08 ENCOUNTER — Ambulatory Visit: Payer: Medicare Other | Admitting: Oncology

## 2022-10-08 ENCOUNTER — Other Ambulatory Visit: Payer: Self-pay

## 2022-10-08 ENCOUNTER — Ambulatory Visit
Admission: RE | Admit: 2022-10-08 | Discharge: 2022-10-08 | Disposition: A | Discharge status: Home / Self Care | Payer: Medicare Other | Source: Ambulatory Visit | Attending: Radiation Oncology | Admitting: Radiation Oncology

## 2022-10-08 ENCOUNTER — Other Ambulatory Visit: Payer: Medicare Other

## 2022-10-08 ENCOUNTER — Inpatient Hospital Stay: Payer: Medicare Other

## 2022-10-08 ENCOUNTER — Inpatient Hospital Stay (HOSPITAL_BASED_OUTPATIENT_CLINIC_OR_DEPARTMENT_OTHER): Payer: Medicare Other | Admitting: Oncology

## 2022-10-08 VITALS — BP 113/70 | HR 66 | Temp 97.8°F | Ht 65.0 in | Wt 104.0 lb

## 2022-10-08 DIAGNOSIS — C3490 Malignant neoplasm of unspecified part of unspecified bronchus or lung: Secondary | ICD-10-CM | POA: Diagnosis not present

## 2022-10-08 DIAGNOSIS — Z87891 Personal history of nicotine dependence: Secondary | ICD-10-CM | POA: Diagnosis not present

## 2022-10-08 DIAGNOSIS — C50912 Malignant neoplasm of unspecified site of left female breast: Secondary | ICD-10-CM | POA: Diagnosis not present

## 2022-10-08 DIAGNOSIS — D751 Secondary polycythemia: Secondary | ICD-10-CM | POA: Diagnosis not present

## 2022-10-08 DIAGNOSIS — Z79811 Long term (current) use of aromatase inhibitors: Secondary | ICD-10-CM | POA: Diagnosis not present

## 2022-10-08 DIAGNOSIS — D696 Thrombocytopenia, unspecified: Secondary | ICD-10-CM

## 2022-10-08 DIAGNOSIS — Z170 Estrogen receptor positive status [ER+]: Secondary | ICD-10-CM | POA: Diagnosis not present

## 2022-10-08 DIAGNOSIS — D702 Other drug-induced agranulocytosis: Secondary | ICD-10-CM | POA: Diagnosis not present

## 2022-10-08 DIAGNOSIS — Z452 Encounter for adjustment and management of vascular access device: Secondary | ICD-10-CM | POA: Diagnosis not present

## 2022-10-08 DIAGNOSIS — C50212 Malignant neoplasm of upper-inner quadrant of left female breast: Secondary | ICD-10-CM

## 2022-10-08 DIAGNOSIS — C3412 Malignant neoplasm of upper lobe, left bronchus or lung: Secondary | ICD-10-CM | POA: Diagnosis not present

## 2022-10-08 DIAGNOSIS — Z5111 Encounter for antineoplastic chemotherapy: Secondary | ICD-10-CM | POA: Diagnosis not present

## 2022-10-08 DIAGNOSIS — Z510 Encounter for antineoplastic radiation therapy: Secondary | ICD-10-CM | POA: Diagnosis not present

## 2022-10-08 LAB — RAD ONC ARIA SESSION SUMMARY
Course Elapsed Days: 10
Plan Fractions Treated to Date: 8
Plan Prescribed Dose Per Fraction: 2 Gy
Plan Total Fractions Prescribed: 33
Plan Total Prescribed Dose: 66 Gy
Reference Point Dosage Given to Date: 16 Gy
Reference Point Session Dosage Given: 2 Gy
Session Number: 8

## 2022-10-08 LAB — CBC WITH DIFFERENTIAL (CANCER CENTER ONLY)
Abs Immature Granulocytes: 0.01 10*3/uL (ref 0.00–0.07)
Basophils Absolute: 0 10*3/uL (ref 0.0–0.1)
Basophils Relative: 0 %
Eosinophils Absolute: 0.1 10*3/uL (ref 0.0–0.5)
Eosinophils Relative: 2 %
HCT: 38.8 % (ref 36.0–46.0)
Hemoglobin: 12.7 g/dL (ref 12.0–15.0)
Immature Granulocytes: 0 %
Lymphocytes Relative: 35 %
Lymphs Abs: 0.9 10*3/uL (ref 0.7–4.0)
MCH: 23.9 pg — ABNORMAL LOW (ref 26.0–34.0)
MCHC: 32.7 g/dL (ref 30.0–36.0)
MCV: 73.1 fL — ABNORMAL LOW (ref 80.0–100.0)
Monocytes Absolute: 0 10*3/uL — ABNORMAL LOW (ref 0.1–1.0)
Monocytes Relative: 2 %
Neutro Abs: 1.5 10*3/uL — ABNORMAL LOW (ref 1.7–7.7)
Neutrophils Relative %: 61 %
Platelet Count: 63 10*3/uL — ABNORMAL LOW (ref 150–400)
RBC: 5.31 MIL/uL — ABNORMAL HIGH (ref 3.87–5.11)
RDW: 15.7 % — ABNORMAL HIGH (ref 11.5–15.5)
WBC Count: 2.5 10*3/uL — ABNORMAL LOW (ref 4.0–10.5)
nRBC: 0 % (ref 0.0–0.2)

## 2022-10-08 LAB — CMP (CANCER CENTER ONLY)
ALT: 17 U/L (ref 0–44)
AST: 15 U/L (ref 15–41)
Albumin: 4.2 g/dL (ref 3.5–5.0)
Alkaline Phosphatase: 47 U/L (ref 38–126)
Anion gap: 6 (ref 5–15)
BUN: 11 mg/dL (ref 8–23)
CO2: 29 mmol/L (ref 22–32)
Calcium: 9.9 mg/dL (ref 8.9–10.3)
Chloride: 103 mmol/L (ref 98–111)
Creatinine: 0.36 mg/dL — ABNORMAL LOW (ref 0.44–1.00)
GFR, Estimated: >60 mL/min (ref >60)
Glucose, Bld: 74 mg/dL (ref 70–99)
Potassium: 4 mmol/L (ref 3.5–5.1)
Sodium: 138 mmol/L (ref 135–145)
Total Bilirubin: 0.4 mg/dL (ref 0.3–1.2)
Total Protein: 6.9 g/dL (ref 6.5–8.1)

## 2022-10-08 LAB — FERRITIN: Ferritin: 60 ng/mL (ref 11–307)

## 2022-10-08 LAB — SAMPLE TO BLOOD BANK

## 2022-10-08 MED ORDER — HEPARIN SOD (PORK) LOCK FLUSH 100 UNIT/ML IV SOLN
500.0000 [IU] | Freq: Once | INTRAVENOUS | Status: AC
  Administered 2022-10-08: 500 [IU] via INTRAVENOUS

## 2022-10-08 MED ORDER — SODIUM CHLORIDE 0.9% FLUSH
10.0000 mL | INTRAVENOUS | Status: DC | PRN
  Administered 2022-10-08: 10 mL via INTRAVENOUS

## 2022-10-08 NOTE — Progress Notes (Signed)
[place]   [phone] [phone]   Clinic Follow up Note   Patient Care Team: Merrilee Seashore, MD as PCP - General (Internal Medicine) Minus Breeding, MD as PCP - Cardiology (Cardiology) [name], MD as Consulting Physician (General Surgery) Truitt Merle, MD as Consulting Physician (Hematology) Kyung Rudd, MD as Consulting Physician (Radiation Oncology) Gardenia Phlegm, NP as Nurse Practitioner (Hematology and Oncology)  Date of Service:  10/08/2022  CHIEF COMPLAINT: Follow-up small cell lung cancer   CURRENT THERAPY:   -Anastrozole 1 mg once daily starting 06/2017   -Status post cycle #1 cisplatin/etoposide started on 09/29/2022 given concurrently with radiation   ASSESSMENT:  Monica [name] is a 76 y.o. female with limited stage small cell lung cancer.  Small cell lung cancer (HCC) -[id number], stage III, limited stage  -found on screening CT chest on July 22, 2022 which showed suprahilar left upper lobe pulmonary mass and mediastinal adenopathy -PET scan from August 08, 2022 showed hypermetabolic left hilar lung mass with mediastinal and left supraclavicular nodal metastasis. 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 Brain MRI negative  -started concurrent chemo cisplatin/etoposide and radiation 09/29/2022 -Tolerated first cycle of chemotherapy well overall with the exception of mild nausea. -Labs from today have been reviewed.  She has mild neutropenia and thrombocytopenia.  Discussed neutropenic and bleeding precautions.   PLAN: -Discussed neutropenic and bleeding precautions.  She is to call for a temp of 100.5 or higher or any signs of bleeding. -Weekly lab and follow-up. -Will begin next cycle of chemotherapy on 10/20/2022.  SUMMARY OF ONCOLOGIC HISTORY: Oncology History Overview Note  Cancer Staging Malignant neoplasm of upper-inner quadrant of left  breast in female, estrogen receptor positive ([place]) Staging form: Breast, AJCC 8th Edition - Clinical stage from 02/16/2017: Stage IA (cT1b, cN0, cM0, G2, ER: Positive, PR: Positive, HER2: Negative) - Unsigned Staging comments: Staged at breast conference  - Pathologic stage from 03/16/2017: Stage IA (pT1b, pN0, cM0, G2, ER: Positive, PR: Positive, HER2: Negative, Oncotype DX score: 27) - Signed by Truitt Merle, MD on 04/24/2017     Malignant neoplasm of upper-inner quadrant of left breast in female, estrogen receptor positive ([place])  02/09/2017 Initial Diagnosis   Malignant neoplasm of upper-inner quadrant of left breast in female, estrogen receptor positive ([place])   02/09/2017 Initial Biopsy   Diagnosis Breast, left, needle core biopsy - INVASIVE DUCTAL CARCINOMA, 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 Dalbert Batman    03/16/2017 Pathology Results   PATHOLOGY REPORT Diagnosis 03/16/17 1. Breast, lumpectomy, Left - INVASIVE DUCTAL CARCINOMA, NOTTINGHAM GRADE 2 OF 3, 0.9 CM - DUCTAL CARCINOMA IN SITU - MARGINS UNINVOLVED BY CARCINOMA (0.3 CM POSTERIOR MARGIN) - PREVIOUS BIOPSY SITE CHANGES - SEE ONCOLOGY TABLE BELOW 2. Lymph node, sentinel, biopsy, Left axillary #1 - NO CARCINOMA IDENTIFIED IN ONE LYMPH NODE (0/1) 3. Lymph node, sentinel, biopsy, Left axillary #2 - NO CARCINOMA IDENTIFIED IN ONE LYMPH NODE (0/1) 4. Lymph node, sentinel, biopsy, Left axillary #3 - NO CARCINOMA IDENTIFIED IN ONE LYMPH NODE (0/1) 5. Lymph node, sentinel, biopsy, Left axillary #4 - NO CARCINOMA IDENTIFIED IN ONE LYMPH NODE (0/1)  03/16/2017 Oncotype testing   Recurrance score of 27 with  a 18% distance recurrance in the net 10 years with Tamoxifen alone    05/19/2017 - 06/16/2017 Radiation Therapy   Radiation with Dr. Lisbeth Renshaw   06/2017 -  Anti-estrogen oral therapy   anastrozole 1 mg once a day starting late 06/2017     04/29/2020 Pathology Results   DIAGNOSIS:   BONE MARROW, ASPIRATE, CLOT, CORE:  -  Cellular marrow with trilineage hematopoiesis  -  No morphologic evidence of carcinoma or hematopoietic neoplasm  -  See microscopic description below   PERIPHERAL BLOOD:  -  Polycythemia and thrombocytopenia  -  See complete blood cell count   MICROSCOPIC DESCRIPTION:   PERIPHERAL BLOOD SMEAR: The peripheral blood has a polycythemia and  thrombocytopenia.  Leukocytes are morphologically unremarkable.   BONE MARROW ASPIRATE: Spicular, cellular and adequate for evaluation  Erythroid precursors: Orderly maturation without overt dysplasia  Granulocytic precursors: Orderly maturation without overt dysplasia  Megakaryocytes: Qualitatively and quantitatively unremarkable  Lymphocytes/plasma cells: No lymphocytosis or plasmacytosis    Small cell lung cancer ([place])  09/14/2022 Cancer Staging   Staging form: Lung, AJCC 8th Edition - Clinical stage from 09/14/2022: Stage IIIB (cT2, cN3, cM0) - Signed by Truitt Merle, MD on 09/16/2022   09/16/2022 Initial Diagnosis   Small cell lung cancer ([place])   09/29/2022 -  Chemotherapy   Patient is on Treatment Plan : LUNG SMALL CELL Cisplatin (80) D1 + Etoposide (100) D1-3 q21d        INTERVAL HISTORY:  Monica [name] is here for routine follow-up following cycle number one of her chemotherapy. Overall tolerated her treatment well overall.  Vomited on 2 occasions but is taking antiemetics.  Feels that nausea is well-controlled overall. He is not having any abdominal pain, fevers, chills, headaches, chest pain, shortness of breath.  No bleeding reported. Trying to eat smaller meals due to nausea.  Weight stable.  MEDICAL HISTORY:  Past  Medical History:  Diagnosis Date   CAD (coronary artery disease)    Cypher stent 09/2002   Cancer ([place]) 2018   left breast, radiation therapy   CHF (congestive heart failure) ([place])    Depression    Diabetes mellitus ([place])    Hyperlipidemia    Hypertension    Myocardial infarction ([employer])    2003,went into cardiac shock   Osteoporosis    Seizures ([place])    1st seizure age late 66's; most recent 07/28/17   Sinusitis     SURGICAL HISTORY: Past Surgical History:  Procedure Laterality Date   ABDOMINAL HYSTERECTOMY     APPENDECTOMY     BREAST LUMPECTOMY WITH RADIOACTIVE SEED AND SENTINEL LYMPH NODE BIOPSY Left 03/16/2017   Procedure: INJECT BLUE DYE LEFT BREAST, LEFT BREAST LUMPECTOMY WITH RADIOACTIVE SEED AND LEFT AXILLARY DEEP SENTINEL LYMPH NODE  BIOPSY ADJACENT TISSUE TRANSFER;  Surgeon: [name], MD;  Location: [facility];  Service: General;  Laterality: Left;   CARPAL TUNNEL RELEASE Bilateral    COLONOSCOPY WITH PROPOFOL N/A 05/30/2015   Procedure: COLONOSCOPY WITH PROPOFOL;  Surgeon: Carol Ada, MD;  Location: [facility];  Service: Endoscopy;  Laterality: N/A;   COLONOSCOPY WITH PROPOFOL N/A 06/09/2018   Procedure: COLONOSCOPY WITH PROPOFOL;  Surgeon: Carol Ada, MD;  Location: [facility];  Service: Endoscopy;  Laterality: N/A;   COLONOSCOPY WITH PROPOFOL N/A 07/31/2021   Procedure: COLONOSCOPY WITH PROPOFOL;  Surgeon: Carol Ada, MD;  Location: [facility];  Service: Endoscopy;  Laterality: N/A;   CORONARY ANGIOPLASTY  WITH STENT PLACEMENT  10-02-2002   ESOPHAGOGASTRODUODENOSCOPY (EGD) WITH PROPOFOL N/A 07/31/2021   Procedure: ESOPHAGOGASTRODUODENOSCOPY (EGD) WITH PROPOFOL;  Surgeon: Carol Ada, MD;  Location: [facility];  Service: Endoscopy;  Laterality: N/A;   Exploratory abdominal     Bleeding in abdomen after tubal   IR IMAGING GUIDED PORT INSERTION  09/28/2022   POLYPECTOMY  06/09/2018   Procedure: POLYPECTOMY;  Surgeon: Carol Ada, MD;  Location: [facility];  Service: Endoscopy;;   POLYPECTOMY  07/31/2021   Procedure: POLYPECTOMY;  Surgeon: Carol Ada, MD;  Location: [facility];  Service: Endoscopy;;   TUBAL LIGATION     UMBILICAL HERNIA REPAIR      I have reviewed the social history and family history with the patient and they are unchanged from previous note.  ALLERGIES:  is allergic to ace inhibitors, keflex [cephalexin], phenobarbital, sulfate, altace [ramipril], codeine, and dicyclomine hcl.  MEDICATIONS:  Current Outpatient Medications  Medication Sig Dispense Refill   acetaminophen (TYLENOL) 500 MG tablet Take 1,000 mg by mouth every 6 (six) hours as needed for moderate pain or headache.     albuterol (VENTOLIN HFA) 108 (90 Base) MCG/ACT inhaler INHALE 2 PUFFS BY MOUTH EVERY 6 HOURS AS NEEDED FOR WHEEZING OR  SHORTHNESS  OF  BREATH 9 g 11   anastrozole (ARIMIDEX) 1 MG tablet Take 1 tablet (1 mg total) by mouth daily. 90 tablet 1   aspirin EC 81 MG tablet Take 81 mg by mouth at bedtime.     atorvastatin (LIPITOR) 40 MG tablet Take 40 mg by mouth at bedtime.     B-D ULTRAFINE III SHORT PEN 31G X 8 MM MISC Inject into the skin as directed.     Calcium Carbonate-Vitamin D (CALTRATE 600+D PO) Take 1 tablet by mouth in the morning and at bedtime.     carvedilol (COREG) 3.125 MG tablet Take 3.125 mg by mouth 2 (two) times daily.      cetirizine (ZYRTEC) 10 MG tablet Take 10 mg by mouth daily as needed for allergies.     clonazePAM (KLONOPIN) 1 MG tablet Take 1 mg by mouth in the morning and at bedtime.     Fluticasone-Umeclidin-Vilant (TRELEGY ELLIPTA) 100-62.5-25 MCG/ACT AEPB INHALE 1 PUFF ONCE DAILY RINSE  MOUTH  AFTER 60 each 11   Insulin Detemir (LEVEMIR) 100 UNIT/ML Pen Inject 17-25 Units into the skin See admin instructions. Inject 25 units subcutaneously in the morning and inject 17 units subcutaneously at night     insulin lispro (HUMALOG) 100 UNIT/ML KwikPen Inject 7-9 Units into the skin See admin instructions. Inject  7 units in the morning, 7 units at lunch, and 9 units at night     ketorolac (ACULAR) 0.5 % ophthalmic solution Place 1 drop into the left eye 4 (four) times daily.     levETIRAcetam (KEPPRA) 500 MG tablet Take 1 tablet (500 mg total) by mouth 2 (two) times daily. 180 tablet 3   lidocaine-prilocaine (EMLA) cream Apply to affected area once 30 g 3   losartan (COZAAR) 50 MG tablet Take 25 mg by mouth at bedtime.     moxifloxacin (VIGAMOX) 0.5 % ophthalmic solution Place 1 drop into the left eye 4 (four) times daily.     nitroGLYCERIN (NITROSTAT) 0.4 MG SL tablet Place 1 tablet (0.4 mg total) under the tongue every 5 (five) minutes as needed for chest pain. 25 tablet 2   Omega-3 Fatty Acids (FISH OIL) 1000 MG CAPS Take 1,000 mg by mouth in the morning  and at bedtime.     omeprazole (PRILOSEC) 20 MG capsule Take 20 mg by mouth in the morning.     ondansetron (ZOFRAN) 8 MG tablet Take 1 tablet (8 mg total) by mouth every 8 (eight) hours as needed for nausea or vomiting. Start on the third day after cisplatin. 30 tablet 1   ONETOUCH VERIO test strip USE THREE TIMES A DAY AND AS NEEDED FOR HYPOGLYCEMIA     OXYGEN Inhale 2 L into the lungs at bedtime.     pioglitazone (ACTOS) 15 MG tablet Take 15 mg by mouth at bedtime.     Polyethyl Glycol-Propyl Glycol 0.4-0.3 % SOLN Place 1 drop into both eyes 2 (two) times daily as needed (itchy/irritated eyes.).     prednisoLONE acetate (PRED FORTE) 1 % ophthalmic suspension Place 1 drop into the left eye 4 (four) times daily.     prochlorperazine (COMPAZINE) 10 MG tablet Take 1 tablet (10 mg total) by mouth every 6 (six) hours as needed for nausea or vomiting. 30 tablet 1   risedronate (ACTONEL) 150 MG tablet Take 150 mg by mouth every 30 (thirty) days.     sertraline (ZOLOFT) 100 MG tablet Take 100 mg by mouth at bedtime.     TRIJARDY XR 12.5-2.02-999 MG TB24 Take 1 tablet by mouth 2 (two) times daily.     No current facility-administered medications for this  visit.    PHYSICAL EXAMINATION: ECOG PERFORMANCE STATUS: 1 - Symptomatic but completely ambulatory  Vitals:   10/08/22 1406  BP: 113/70  Pulse: 66  Temp: 97.8 F (36.6 C)  SpO2: 97%   Wt Readings from Last 3 Encounters:  10/08/22 47.2 kg  10/01/22 47.1 kg  09/30/22 48.9 kg     GENERAL:alert, no distress and comfortable SKIN: skin color normal, no rashes or significant lesions EYES: normal, Conjunctiva are pink and non-injected, sclera clear  CV: RRR LUNGS: Clear to auscultation bilaterally. NEURO: alert & oriented x 3 with fluent speech LABORATORY DATA:  I have reviewed the data as listed    Latest Ref Rng & Units 09/29/2022    7:56 AM 09/08/2022   10:48 AM 03/03/2022   12:11 PM  CBC  WBC 4.0 - 10.5 K/uL 5.1  5.6  6.1   Hemoglobin 12.0 - 15.0 g/dL 13.6  15.1  16.4   Hematocrit 36.0 - 46.0 % 42.4  47.3  50.8   Platelets 150 - 400 K/uL 135  161  145         Latest Ref Rng & Units 09/29/2022    7:56 AM 09/08/2022   10:48 AM 03/03/2022   12:11 PM  CMP  Glucose 70 - 99 mg/dL 117  130  156   BUN 8 - 23 mg/dL _0 Creatinine 0.44 - 1.00 mg/dL 0.45  0.57  0.61   Sodium 135 - 145 mmol/L 136  137  139   Potassium 3.5 - 5.1 mmol/L 3.8  4.3  4.3   Chloride 98 - 111 mmol/L 104  102  104   CO2 22 - 32 mmol/L _1 Calcium 8.9 - 10.3 mg/dL 9.0  10.1  9.6   Total Protein 6.5 - 8.1 g/dL 7.0  7.7  7.5   Total Bilirubin 0.3 - 1.2 mg/dL 0.6  0.4  0.4   Alkaline Phos 38 - 126 U/L 42  57  73   AST 15 - 41 U/L 21  15  16  ALT 0 - 44 U/L _0 RADIOGRAPHIC STUDIES: I have personally reviewed the radiological images as listed and agreed with the findings in the report. No results found.    No orders of the defined types were placed in this encounter.     Mikey Bussing, NP 10/08/2022

## 2022-10-12 ENCOUNTER — Other Ambulatory Visit: Payer: Self-pay

## 2022-10-12 ENCOUNTER — Ambulatory Visit
Admission: RE | Admit: 2022-10-12 | Discharge: 2022-10-12 | Disposition: A | Discharge status: Home / Self Care | Payer: Medicare Other | Source: Ambulatory Visit | Attending: Radiation Oncology | Admitting: Radiation Oncology

## 2022-10-12 DIAGNOSIS — Z510 Encounter for antineoplastic radiation therapy: Secondary | ICD-10-CM | POA: Diagnosis not present

## 2022-10-12 DIAGNOSIS — Z5111 Encounter for antineoplastic chemotherapy: Secondary | ICD-10-CM | POA: Insufficient documentation

## 2022-10-12 DIAGNOSIS — C3412 Malignant neoplasm of upper lobe, left bronchus or lung: Secondary | ICD-10-CM | POA: Diagnosis not present

## 2022-10-12 DIAGNOSIS — Z87891 Personal history of nicotine dependence: Secondary | ICD-10-CM | POA: Diagnosis not present

## 2022-10-12 DIAGNOSIS — C3490 Malignant neoplasm of unspecified part of unspecified bronchus or lung: Secondary | ICD-10-CM | POA: Insufficient documentation

## 2022-10-12 LAB — RAD ONC ARIA SESSION SUMMARY
Course Elapsed Days: 14
Plan Fractions Treated to Date: 9
Plan Prescribed Dose Per Fraction: 2 Gy
Plan Total Fractions Prescribed: 33
Plan Total Prescribed Dose: 66 Gy
Reference Point Dosage Given to Date: 18 Gy
Reference Point Session Dosage Given: 2 Gy
Session Number: 9

## 2022-10-13 ENCOUNTER — Other Ambulatory Visit: Payer: Self-pay

## 2022-10-13 ENCOUNTER — Ambulatory Visit
Admission: RE | Admit: 2022-10-13 | Discharge: 2022-10-13 | Disposition: A | Discharge status: Home / Self Care | Payer: Medicare Other | Source: Ambulatory Visit | Attending: Radiation Oncology | Admitting: Radiation Oncology

## 2022-10-13 DIAGNOSIS — J449 Chronic obstructive pulmonary disease, unspecified: Secondary | ICD-10-CM | POA: Diagnosis not present

## 2022-10-13 DIAGNOSIS — Z5111 Encounter for antineoplastic chemotherapy: Secondary | ICD-10-CM | POA: Diagnosis not present

## 2022-10-13 DIAGNOSIS — Z87891 Personal history of nicotine dependence: Secondary | ICD-10-CM | POA: Diagnosis not present

## 2022-10-13 DIAGNOSIS — C3412 Malignant neoplasm of upper lobe, left bronchus or lung: Secondary | ICD-10-CM | POA: Diagnosis not present

## 2022-10-13 DIAGNOSIS — Z510 Encounter for antineoplastic radiation therapy: Secondary | ICD-10-CM | POA: Diagnosis not present

## 2022-10-13 LAB — RAD ONC ARIA SESSION SUMMARY
Course Elapsed Days: 15
Plan Fractions Treated to Date: 10
Plan Prescribed Dose Per Fraction: 2 Gy
Plan Total Fractions Prescribed: 33
Plan Total Prescribed Dose: 66 Gy
Reference Point Dosage Given to Date: 20 Gy
Reference Point Session Dosage Given: 2 Gy
Session Number: 10

## 2022-10-14 ENCOUNTER — Other Ambulatory Visit: Payer: Self-pay

## 2022-10-14 ENCOUNTER — Ambulatory Visit
Admission: RE | Admit: 2022-10-14 | Discharge: 2022-10-14 | Disposition: A | Discharge status: Home / Self Care | Payer: Medicare Other | Source: Ambulatory Visit | Attending: Radiation Oncology | Admitting: Radiation Oncology

## 2022-10-14 ENCOUNTER — Ambulatory Visit: Payer: Medicare Other | Attending: Audiologist | Admitting: Audiologist

## 2022-10-14 DIAGNOSIS — C3412 Malignant neoplasm of upper lobe, left bronchus or lung: Secondary | ICD-10-CM | POA: Diagnosis not present

## 2022-10-14 DIAGNOSIS — H903 Sensorineural hearing loss, bilateral: Secondary | ICD-10-CM | POA: Diagnosis not present

## 2022-10-14 DIAGNOSIS — Z510 Encounter for antineoplastic radiation therapy: Secondary | ICD-10-CM | POA: Diagnosis not present

## 2022-10-14 DIAGNOSIS — Z87891 Personal history of nicotine dependence: Secondary | ICD-10-CM | POA: Diagnosis not present

## 2022-10-14 DIAGNOSIS — Z5111 Encounter for antineoplastic chemotherapy: Secondary | ICD-10-CM | POA: Diagnosis not present

## 2022-10-14 LAB — RAD ONC ARIA SESSION SUMMARY
Course Elapsed Days: 16
Plan Fractions Treated to Date: 11
Plan Prescribed Dose Per Fraction: 2 Gy
Plan Total Fractions Prescribed: 33
Plan Total Prescribed Dose: 66 Gy
Reference Point Dosage Given to Date: 22 Gy
Reference Point Session Dosage Given: 2 Gy
Session Number: 11

## 2022-10-14 NOTE — Procedures (Signed)
  [facility] [address], [place]  [phone]  Ototoxic Monitoring Baseline Audiologic Evaluation   NAME: Monica [name]     DOB:   06/14/1946      MRN: [id number]                                                                                     DATE: 10/14/2022     REFERENT: Ashby Dawes STATUS: Outpatient DIAGNOSIS: Audiologic Evaluation for the Purpose of Ototoxic Monitoring   History: Keeleigh was seen for an audiological evaluation. Layza was accompanied to the appointment by her husband. Lillyth is receiving a hearing pure tone screening for the purpose of ototoxic medication exposure. Her baseline was performed 09/24/22. 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 High frequency audiometry showed moderate sloping to severe levels from 8-12.5kHz    Evaluation:  Otoscopy showed a clear view of the tympanic membranes, bilaterally Sensitive Range to Ototoxicity (SRO) is stable. No changes in hearing from 4-12.5kHz. three highest frequency thresholds below 100dB    Results:  The test results were reviewed with Hassan Rowan. Hearing is stable through all frequencies sensitive to ototoxicity. Hearing still needs to be monitored but can push out next screening for eight weeks. Return sooner if difficulty hearing increases or tinnitus starts.   Recommendations: 1.   Return for audiological evaluation in eight weeks. No change in hearing at this time due to medication. Tari and husband asked that I call her daughter too coordinate follow up care.   18 minutes spent testing and counseling on results.    Alfonse Alpers  Audiologist, Au.D., CCC-A 10/14/2022  3:18 PM  Cc:  Ashby Dawes

## 2022-10-15 ENCOUNTER — Telehealth: Payer: Self-pay

## 2022-10-15 ENCOUNTER — Ambulatory Visit
Admission: RE | Admit: 2022-10-15 | Discharge: 2022-10-15 | Disposition: A | Discharge status: Home / Self Care | Payer: Medicare Other | Source: Ambulatory Visit | Attending: Radiation Oncology | Admitting: Radiation Oncology

## 2022-10-15 ENCOUNTER — Other Ambulatory Visit: Payer: Self-pay

## 2022-10-15 ENCOUNTER — Inpatient Hospital Stay: Payer: Medicare Other

## 2022-10-15 ENCOUNTER — Inpatient Hospital Stay (HOSPITAL_BASED_OUTPATIENT_CLINIC_OR_DEPARTMENT_OTHER): Payer: Medicare Other | Admitting: Hematology

## 2022-10-15 VITALS — BP 108/79 | HR 76 | Temp 97.6°F | Resp 16 | Ht 65.0 in | Wt 104.8 lb

## 2022-10-15 DIAGNOSIS — C3412 Malignant neoplasm of upper lobe, left bronchus or lung: Secondary | ICD-10-CM | POA: Insufficient documentation

## 2022-10-15 DIAGNOSIS — C770 Secondary and unspecified malignant neoplasm of lymph nodes of head, face and neck: Secondary | ICD-10-CM | POA: Insufficient documentation

## 2022-10-15 DIAGNOSIS — Z170 Estrogen receptor positive status [ER+]: Secondary | ICD-10-CM | POA: Insufficient documentation

## 2022-10-15 DIAGNOSIS — D701 Agranulocytosis secondary to cancer chemotherapy: Secondary | ICD-10-CM | POA: Insufficient documentation

## 2022-10-15 DIAGNOSIS — Z5111 Encounter for antineoplastic chemotherapy: Secondary | ICD-10-CM | POA: Insufficient documentation

## 2022-10-15 DIAGNOSIS — Z452 Encounter for adjustment and management of vascular access device: Secondary | ICD-10-CM | POA: Insufficient documentation

## 2022-10-15 DIAGNOSIS — C3490 Malignant neoplasm of unspecified part of unspecified bronchus or lung: Secondary | ICD-10-CM

## 2022-10-15 DIAGNOSIS — C50212 Malignant neoplasm of upper-inner quadrant of left female breast: Secondary | ICD-10-CM | POA: Insufficient documentation

## 2022-10-15 DIAGNOSIS — Z79899 Other long term (current) drug therapy: Secondary | ICD-10-CM | POA: Insufficient documentation

## 2022-10-15 DIAGNOSIS — D6959 Other secondary thrombocytopenia: Secondary | ICD-10-CM | POA: Insufficient documentation

## 2022-10-15 DIAGNOSIS — Z95828 Presence of other vascular implants and grafts: Secondary | ICD-10-CM

## 2022-10-15 DIAGNOSIS — Z79811 Long term (current) use of aromatase inhibitors: Secondary | ICD-10-CM | POA: Insufficient documentation

## 2022-10-15 DIAGNOSIS — D751 Secondary polycythemia: Secondary | ICD-10-CM

## 2022-10-15 DIAGNOSIS — Z87891 Personal history of nicotine dependence: Secondary | ICD-10-CM | POA: Diagnosis not present

## 2022-10-15 DIAGNOSIS — J449 Chronic obstructive pulmonary disease, unspecified: Secondary | ICD-10-CM | POA: Diagnosis not present

## 2022-10-15 DIAGNOSIS — Z510 Encounter for antineoplastic radiation therapy: Secondary | ICD-10-CM | POA: Diagnosis not present

## 2022-10-15 LAB — CMP (CANCER CENTER ONLY)
ALT: 12 U/L (ref 0–44)
AST: 16 U/L (ref 15–41)
Albumin: 4.4 g/dL (ref 3.5–5.0)
Alkaline Phosphatase: 47 U/L (ref 38–126)
Anion gap: 7 (ref 5–15)
BUN: 8 mg/dL (ref 8–23)
CO2: 27 mmol/L (ref 22–32)
Calcium: 9.5 mg/dL (ref 8.9–10.3)
Chloride: 103 mmol/L (ref 98–111)
Creatinine: 0.42 mg/dL — ABNORMAL LOW (ref 0.44–1.00)
GFR, Estimated: >60 mL/min (ref >60)
Glucose, Bld: 79 mg/dL (ref 70–99)
Potassium: 4.1 mmol/L (ref 3.5–5.1)
Sodium: 137 mmol/L (ref 135–145)
Total Bilirubin: 0.5 mg/dL (ref 0.3–1.2)
Total Protein: 6.7 g/dL (ref 6.5–8.1)

## 2022-10-15 LAB — RAD ONC ARIA SESSION SUMMARY
Course Elapsed Days: 17
Plan Fractions Treated to Date: 12
Plan Prescribed Dose Per Fraction: 2 Gy
Plan Total Fractions Prescribed: 33
Plan Total Prescribed Dose: 66 Gy
Reference Point Dosage Given to Date: 24 Gy
Reference Point Session Dosage Given: 2 Gy
Session Number: 12

## 2022-10-15 LAB — CBC WITH DIFFERENTIAL (CANCER CENTER ONLY)
Abs Immature Granulocytes: 0 10*3/uL (ref 0.00–0.07)
Basophils Absolute: 0 10*3/uL (ref 0.0–0.1)
Basophils Relative: 0 %
Eosinophils Absolute: 0.1 10*3/uL (ref 0.0–0.5)
Eosinophils Relative: 6 %
HCT: 40.1 % (ref 36.0–46.0)
Hemoglobin: 13.2 g/dL (ref 12.0–15.0)
Immature Granulocytes: 0 %
Lymphocytes Relative: 53 %
Lymphs Abs: 0.5 10*3/uL — ABNORMAL LOW (ref 0.7–4.0)
MCH: 24.3 pg — ABNORMAL LOW (ref 26.0–34.0)
MCHC: 32.9 g/dL (ref 30.0–36.0)
MCV: 73.8 fL — ABNORMAL LOW (ref 80.0–100.0)
Monocytes Absolute: 0.2 10*3/uL (ref 0.1–1.0)
Monocytes Relative: 22 %
Neutro Abs: 0.2 10*3/uL — CL (ref 1.7–7.7)
Neutrophils Relative %: 19 %
Platelet Count: 69 10*3/uL — ABNORMAL LOW (ref 150–400)
RBC: 5.43 MIL/uL — ABNORMAL HIGH (ref 3.87–5.11)
RDW: 18.4 % — ABNORMAL HIGH (ref 11.5–15.5)
WBC Count: 0.9 10*3/uL — CL (ref 4.0–10.5)
nRBC: 0 % (ref 0.0–0.2)

## 2022-10-15 LAB — SAMPLE TO BLOOD BANK

## 2022-10-15 LAB — FERRITIN: Ferritin: 33 ng/mL (ref 11–307)

## 2022-10-15 MED ORDER — SODIUM CHLORIDE 0.9% FLUSH
10.0000 mL | INTRAVENOUS | Status: AC | PRN
  Administered 2022-10-15: 10 mL

## 2022-10-15 MED ORDER — HEPARIN SOD (PORK) LOCK FLUSH 100 UNIT/ML IV SOLN
500.0000 [IU] | INTRAVENOUS | Status: AC | PRN
  Administered 2022-10-15: 500 [IU]

## 2022-10-15 NOTE — Progress Notes (Signed)
[facility]   [phone]) [phone] [phone]   Clinic Follow up Note   Patient Care Team: Merrilee Seashore, MD as PCP - General (Internal Medicine) Minus Breeding, MD as PCP - Cardiology (Cardiology) [name], MD as Consulting Physician (General Surgery) Truitt Merle, MD as Consulting Physician (Hematology) Kyung Rudd, MD as Consulting Physician (Radiation Oncology) Gardenia Phlegm, NP as Nurse Practitioner (Hematology and Oncology)  Date of Service:  10/15/2022  CHIEF COMPLAINT: f/u of small cell lung cancer    CURRENT THERAPY:  -Anastrozole 1 mg once daily starting 06/2017   -Status post cycle #1 cisplatin/etoposide started on 09/29/2022 given concurrently with radiation  ASSESSMENT:  Monica [name] is a 77 y.o. female with   Small cell lung cancer ([place]) -[id number], stage III, limited stage  -found on screening CT chest on July 22, 2022 which showed suprahilar left upper lobe pulmonary mass and mediastinal adenopathy -PET scan from August 08, 2022 showed hypermetabolic left hilar lung mass with mediastinal and left supraclavicular nodal metastasis. 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 Brain MRI negative  -started concurrent chemo cisplatin/etoposide and radiation 09/29/2022 -She tolerated first cycle chemotherapy well.  We again reviewed potential side effects, and management.  Neutropenia and thrombocytopenia  -secondary to chemo -infection precaution reviewed   PLAN: - lab reviewed. Platelet count low -Will begin next cycle of chemotherapy on 10/20/2022.  - lab 10/19/22  to determined if chemotherapy will proceed.  SUMMARY OF ONCOLOGIC HISTORY: Oncology History Overview Note  Cancer Staging Malignant neoplasm of upper-inner quadrant of left breast in female, estrogen receptor positive ([place]) Staging form: Breast, AJCC 8th Edition - Clinical stage from 02/16/2017: Stage  IA (cT1b, cN0, cM0, G2, ER: Positive, PR: Positive, HER2: Negative) - Unsigned Staging comments: Staged at breast conference  - Pathologic stage from 03/16/2017: Stage IA (pT1b, pN0, cM0, G2, ER: Positive, PR: Positive, HER2: Negative, Oncotype DX score: 27) - Signed by Truitt Merle, MD on 04/24/2017     Malignant neoplasm of upper-inner quadrant of left breast in female, estrogen receptor positive ([place])  02/09/2017 Initial Diagnosis   Malignant neoplasm of upper-inner quadrant of left breast in female, estrogen receptor positive ([place])   02/09/2017 Initial Biopsy   Diagnosis Breast, left, needle core biopsy - INVASIVE DUCTAL CARCINOMA, 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 Dalbert Batman    03/16/2017 Pathology Results   PATHOLOGY REPORT Diagnosis 03/16/17 1. Breast, lumpectomy, Left - INVASIVE DUCTAL CARCINOMA, NOTTINGHAM GRADE 2 OF 3, 0.9 CM - DUCTAL CARCINOMA IN SITU - MARGINS UNINVOLVED BY CARCINOMA (0.3 CM POSTERIOR MARGIN) - PREVIOUS BIOPSY SITE CHANGES - SEE ONCOLOGY TABLE BELOW 2. Lymph node, sentinel, biopsy, Left axillary #1 - NO CARCINOMA IDENTIFIED IN ONE LYMPH NODE (0/1) 3. Lymph node, sentinel, biopsy, Left axillary #2 - NO CARCINOMA IDENTIFIED IN ONE LYMPH NODE (0/1) 4. Lymph node, sentinel, biopsy, Left axillary #3 - NO CARCINOMA IDENTIFIED IN ONE LYMPH NODE (0/1) 5. Lymph node, sentinel, biopsy, Left axillary #4 - NO CARCINOMA IDENTIFIED IN ONE LYMPH NODE (0/1)    03/16/2017 Oncotype testing   Recurrance score of 27 with a 18% distance recurrance in the net 10 years with  Tamoxifen alone    05/19/2017 - 06/16/2017 Radiation Therapy   Radiation with  Dr. Lisbeth Renshaw   06/2017 -  Anti-estrogen oral therapy   anastrozole 1 mg once a day starting late 06/2017     04/29/2020 Pathology Results   DIAGNOSIS:   BONE MARROW, ASPIRATE, CLOT, CORE:  -  Cellular marrow with trilineage hematopoiesis  -  No morphologic evidence of carcinoma or hematopoietic neoplasm  -  See microscopic description below   PERIPHERAL BLOOD:  -  Polycythemia and thrombocytopenia  -  See complete blood cell count   MICROSCOPIC DESCRIPTION:   PERIPHERAL BLOOD SMEAR: The peripheral blood has a polycythemia and  thrombocytopenia.  Leukocytes are morphologically unremarkable.   BONE MARROW ASPIRATE: Spicular, cellular and adequate for evaluation  Erythroid precursors: Orderly maturation without overt dysplasia  Granulocytic precursors: Orderly maturation without overt dysplasia  Megakaryocytes: Qualitatively and quantitatively unremarkable  Lymphocytes/plasma cells: No lymphocytosis or plasmacytosis    Small cell lung cancer ([place])  09/14/2022 Cancer Staging   Staging form: Lung, AJCC 8th Edition - Clinical stage from 09/14/2022: Stage IIIB (cT2, cN3, cM0) - Signed by Truitt Merle, MD on 09/16/2022   09/16/2022 Initial Diagnosis   Small cell lung cancer ([place])   09/29/2022 -  Chemotherapy   Patient is on Treatment Plan : LUNG SMALL CELL Cisplatin (80) D1 + Etoposide (100) D1-3 q21d        INTERVAL HISTORY:  Monica [name] is here for a follow up of  small cell lung cancer  She was last seen by NPErasmo Downer on 10/08/22 She presents to the clinic accompanied by daughter. Pt reports of being Fatigue. Overall she is doing well.   All other systems were reviewed with the patient and are negative.  MEDICAL HISTORY:  Past Medical History:  Diagnosis Date   CAD (coronary artery disease)    Cypher stent 09/2002   Cancer ([place]) 2018   left breast, radiation therapy   CHF (congestive heart failure) ([place])    Depression    Diabetes mellitus ([place])    Hyperlipidemia     Hypertension    Myocardial infarction ([place])    2003,went into cardiac shock   Osteoporosis    Seizures ([place])    1st seizure age late 60's; most recent 07/28/17   Sinusitis     SURGICAL HISTORY: Past Surgical History:  Procedure Laterality Date   ABDOMINAL HYSTERECTOMY     APPENDECTOMY     BREAST LUMPECTOMY WITH RADIOACTIVE SEED AND SENTINEL LYMPH NODE BIOPSY Left 03/16/2017   Procedure: INJECT BLUE DYE LEFT BREAST, LEFT BREAST LUMPECTOMY WITH RADIOACTIVE SEED AND LEFT AXILLARY DEEP SENTINEL LYMPH NODE  BIOPSY ADJACENT TISSUE TRANSFER;  Surgeon: [name], MD;  Location: [facility];  Service: General;  Laterality: Left;   CARPAL TUNNEL RELEASE Bilateral    COLONOSCOPY WITH PROPOFOL N/A 05/30/2015   Procedure: COLONOSCOPY WITH PROPOFOL;  Surgeon: Carol Ada, MD;  Location: [facility];  Service: Endoscopy;  Laterality: N/A;   COLONOSCOPY WITH PROPOFOL N/A 06/09/2018   Procedure: COLONOSCOPY WITH PROPOFOL;  Surgeon: Carol Ada, MD;  Location: [facility];  Service: Endoscopy;  Laterality: N/A;   COLONOSCOPY WITH PROPOFOL N/A 07/31/2021   Procedure: COLONOSCOPY WITH PROPOFOL;  Surgeon: Carol Ada, MD;  Location: [facility];  Service: Endoscopy;  Laterality: N/A;   CORONARY ANGIOPLASTY WITH STENT PLACEMENT  10-02-2002   ESOPHAGOGASTRODUODENOSCOPY (EGD) WITH PROPOFOL N/A 07/31/2021   Procedure: ESOPHAGOGASTRODUODENOSCOPY (EGD) WITH PROPOFOL;  Surgeon: Carol Ada, MD;  Location: [facility];  Service:  Endoscopy;  Laterality: N/A;   Exploratory abdominal     Bleeding in abdomen after tubal   IR IMAGING GUIDED PORT INSERTION  09/28/2022   POLYPECTOMY  06/09/2018   Procedure: POLYPECTOMY;  Surgeon: Carol Ada, MD;  Location: [facility];  Service: Endoscopy;;   POLYPECTOMY  07/31/2021   Procedure: POLYPECTOMY;  Surgeon: Carol Ada, MD;  Location: [facility];  Service: Endoscopy;;   TUBAL LIGATION     UMBILICAL HERNIA REPAIR      I have reviewed the social history and  family history with the patient and they are unchanged from previous note.  ALLERGIES:  is allergic to ace inhibitors, keflex [cephalexin], phenobarbital, sulfate, altace [ramipril], codeine, and dicyclomine hcl.  MEDICATIONS:  Current Outpatient Medications  Medication Sig Dispense Refill   acetaminophen (TYLENOL) 500 MG tablet Take 1,000 mg by mouth every 6 (six) hours as needed for moderate pain or headache.     albuterol (VENTOLIN HFA) 108 (90 Base) MCG/ACT inhaler INHALE 2 PUFFS BY MOUTH EVERY 6 HOURS AS NEEDED FOR WHEEZING OR  SHORTHNESS  OF  BREATH 9 g 11   anastrozole (ARIMIDEX) 1 MG tablet Take 1 tablet (1 mg total) by mouth daily. 90 tablet 1   aspirin EC 81 MG tablet Take 81 mg by mouth at bedtime.     atorvastatin (LIPITOR) 40 MG tablet Take 40 mg by mouth at bedtime.     B-D ULTRAFINE III SHORT PEN 31G X 8 MM MISC Inject into the skin as directed.     Calcium Carbonate-Vitamin D (CALTRATE 600+D PO) Take 1 tablet by mouth in the morning and at bedtime.     carvedilol (COREG) 3.125 MG tablet Take 3.125 mg by mouth 2 (two) times daily.      cetirizine (ZYRTEC) 10 MG tablet Take 10 mg by mouth daily as needed for allergies.     clonazePAM (KLONOPIN) 1 MG tablet Take 1 mg by mouth in the morning and at bedtime.     Fluticasone-Umeclidin-Vilant (TRELEGY ELLIPTA) 100-62.5-25 MCG/ACT AEPB INHALE 1 PUFF ONCE DAILY RINSE  MOUTH  AFTER 60 each 11   Insulin Detemir (LEVEMIR) 100 UNIT/ML Pen Inject 17-25 Units into the skin See admin instructions. Inject 25 units subcutaneously in the morning and inject 17 units subcutaneously at night     insulin lispro (HUMALOG) 100 UNIT/ML KwikPen Inject 7-9 Units into the skin See admin instructions. Inject 7 units in the morning, 7 units at lunch, and 9 units at night     ketorolac (ACULAR) 0.5 % ophthalmic solution Place 1 drop into the left eye 4 (four) times daily.     levETIRAcetam (KEPPRA) 500 MG tablet Take 1 tablet (500 mg total) by mouth 2 (two)  times daily. 180 tablet 3   lidocaine-prilocaine (EMLA) cream Apply to affected area once 30 g 3   losartan (COZAAR) 50 MG tablet Take 25 mg by mouth at bedtime.     moxifloxacin (VIGAMOX) 0.5 % ophthalmic solution Place 1 drop into the left eye 4 (four) times daily.     nitroGLYCERIN (NITROSTAT) 0.4 MG SL tablet Place 1 tablet (0.4 mg total) under the tongue every 5 (five) minutes as needed for chest pain. 25 tablet 2   Omega-3 Fatty Acids (FISH OIL) 1000 MG CAPS Take 1,000 mg by mouth in the morning and at bedtime.     omeprazole (PRILOSEC) 20 MG capsule Take 20 mg by mouth in the morning.     ondansetron (ZOFRAN) 8 MG tablet Take 1  tablet (8 mg total) by mouth every 8 (eight) hours as needed for nausea or vomiting. Start on the third day after cisplatin. 30 tablet 1   ONETOUCH VERIO test strip USE THREE TIMES A DAY AND AS NEEDED FOR HYPOGLYCEMIA     OXYGEN Inhale 2 L into the lungs at bedtime.     pioglitazone (ACTOS) 15 MG tablet Take 15 mg by mouth at bedtime.     Polyethyl Glycol-Propyl Glycol 0.4-0.3 % SOLN Place 1 drop into both eyes 2 (two) times daily as needed (itchy/irritated eyes.).     prednisoLONE acetate (PRED FORTE) 1 % ophthalmic suspension Place 1 drop into the left eye 4 (four) times daily.     prochlorperazine (COMPAZINE) 10 MG tablet Take 1 tablet (10 mg total) by mouth every 6 (six) hours as needed for nausea or vomiting. 30 tablet 1   risedronate (ACTONEL) 150 MG tablet Take 150 mg by mouth every 30 (thirty) days.     sertraline (ZOLOFT) 100 MG tablet Take 100 mg by mouth at bedtime.     TRIJARDY XR 12.5-2.02-999 MG TB24 Take 1 tablet by mouth 2 (two) times daily.     No current facility-administered medications for this visit.    PHYSICAL EXAMINATION: ECOG PERFORMANCE STATUS: 1 - Symptomatic but completely ambulatory  Vitals:   10/15/22 1426  BP: 108/79  Pulse: 76  Resp: 16  Temp: 97.6 F (36.4 C)  SpO2: 97%   Wt Readings from Last 3 Encounters:  10/15/22  104 lb 12.8 oz (47.5 kg)  10/08/22 104 lb (47.2 kg)  10/01/22 103 lb 12.8 oz (47.1 kg)     GENERAL:alert, no distress and comfortable SKIN: skin color normal, no rashes or significant lesions EYES: normal, Conjunctiva are pink and non-injected, sclera clear  NEURO: alert & oriented x 3 with fluent speech  LABORATORY DATA:  I have reviewed the data as listed    Latest Ref Rng & Units 10/15/2022    1:53 PM 10/08/2022    1:49 PM 09/29/2022    7:56 AM  CBC  WBC 4.0 - 10.5 K/uL 0.9  2.5  5.1   Hemoglobin 12.0 - 15.0 g/dL 13.2  12.7  13.6   Hematocrit 36.0 - 46.0 % 40.1  38.8  42.4   Platelets 150 - 400 K/uL 69  63  135         Latest Ref Rng & Units 10/15/2022    1:53 PM 10/08/2022    1:49 PM 09/29/2022    7:56 AM  CMP  Glucose 70 - 99 mg/dL 79  74  117   BUN 8 - 23 mg/dL 8  11  11    Creatinine 0.44 - 1.00 mg/dL 0.42  0.36  0.45   Sodium 135 - 145 mmol/L 137  138  136   Potassium 3.5 - 5.1 mmol/L 4.1  4.0  3.8   Chloride 98 - 111 mmol/L 103  103  104   CO2 22 - 32 mmol/L 27  29  24    Calcium 8.9 - 10.3 mg/dL 9.5  9.9  9.0   Total Protein 6.5 - 8.1 g/dL 6.7  6.9  7.0   Total Bilirubin 0.3 - 1.2 mg/dL 0.5  0.4  0.6   Alkaline Phos 38 - 126 U/L 47  47  42   AST 15 - 41 U/L 16  15  21    ALT 0 - 44 U/L 12  17  14        RADIOGRAPHIC STUDIES:  I have personally reviewed the radiological images as listed and agreed with the findings in the report. No results found.    No orders of the defined types were placed in this encounter.  All questions were answered. The patient knows to call the clinic with any problems, questions or concerns. No barriers to learning was detected. The total time spent in the appointment was 20 minutes.     Truitt Merle, MD 10/15/2022   Felicity Coyer, CMA, am acting as scribe for Truitt Merle, MD.   I have reviewed the above documentation for accuracy and completeness, and I agree with the above.

## 2022-10-15 NOTE — Assessment & Plan Note (Signed)
-  [id number], stage III, limited stage  -found on screening CT chest on July 22, 2022 which showed suprahilar left upper lobe pulmonary mass and mediastinal adenopathy -PET scan from August 08, 2022 showed hypermetabolic left hilar lung mass with mediastinal and left supraclavicular nodal metastasis. 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 Brain MRI negative  -started concurrent chemo cisplatin/etoposide and radiation 09/29/2022 -She tolerated first cycle chemotherapy well.  We again reviewed potential side effects, and management.

## 2022-10-15 NOTE — Telephone Encounter (Signed)
CRITICAL VALUE STICKER  CRITICAL VALUE: WBC 0.9 ANC 0.2  RECEIVER (on-site recipient of call): Maurine Simmering CMA  DATE & TIME NOTIFIED: 10/15/2022 1423  MESSENGER (representative from lab): Pam  MD NOTIFIED: Dr. Burr Medico  TIME OF NOTIFICATION: 9022  RESPONSE:  Notified Dr. Burr Medico

## 2022-10-18 ENCOUNTER — Ambulatory Visit
Admission: RE | Admit: 2022-10-18 | Discharge: 2022-10-18 | Disposition: A | Discharge status: Home / Self Care | Payer: Medicare Other | Source: Ambulatory Visit | Attending: Radiation Oncology | Admitting: Radiation Oncology

## 2022-10-18 ENCOUNTER — Other Ambulatory Visit: Payer: Self-pay

## 2022-10-18 DIAGNOSIS — Z510 Encounter for antineoplastic radiation therapy: Secondary | ICD-10-CM | POA: Diagnosis not present

## 2022-10-18 DIAGNOSIS — Z87891 Personal history of nicotine dependence: Secondary | ICD-10-CM | POA: Diagnosis not present

## 2022-10-18 DIAGNOSIS — Z5111 Encounter for antineoplastic chemotherapy: Secondary | ICD-10-CM | POA: Diagnosis not present

## 2022-10-18 DIAGNOSIS — C3412 Malignant neoplasm of upper lobe, left bronchus or lung: Secondary | ICD-10-CM | POA: Diagnosis not present

## 2022-10-18 LAB — RAD ONC ARIA SESSION SUMMARY
Course Elapsed Days: 20
Plan Fractions Treated to Date: 13
Plan Prescribed Dose Per Fraction: 2 Gy
Plan Total Fractions Prescribed: 33
Plan Total Prescribed Dose: 66 Gy
Reference Point Dosage Given to Date: 26 Gy
Reference Point Session Dosage Given: 2 Gy
Session Number: 13

## 2022-10-19 ENCOUNTER — Other Ambulatory Visit: Payer: Self-pay

## 2022-10-19 ENCOUNTER — Inpatient Hospital Stay: Payer: Medicare Other

## 2022-10-19 ENCOUNTER — Ambulatory Visit
Admission: RE | Admit: 2022-10-19 | Discharge: 2022-10-19 | Disposition: A | Discharge status: Home / Self Care | Payer: Medicare Other | Source: Ambulatory Visit | Attending: Radiation Oncology | Admitting: Radiation Oncology

## 2022-10-19 DIAGNOSIS — C3412 Malignant neoplasm of upper lobe, left bronchus or lung: Secondary | ICD-10-CM | POA: Diagnosis not present

## 2022-10-19 DIAGNOSIS — Z5111 Encounter for antineoplastic chemotherapy: Secondary | ICD-10-CM | POA: Diagnosis not present

## 2022-10-19 DIAGNOSIS — Z510 Encounter for antineoplastic radiation therapy: Secondary | ICD-10-CM | POA: Diagnosis not present

## 2022-10-19 DIAGNOSIS — C3490 Malignant neoplasm of unspecified part of unspecified bronchus or lung: Secondary | ICD-10-CM

## 2022-10-19 DIAGNOSIS — Z87891 Personal history of nicotine dependence: Secondary | ICD-10-CM | POA: Diagnosis not present

## 2022-10-19 LAB — RAD ONC ARIA SESSION SUMMARY
Course Elapsed Days: 21
Plan Fractions Treated to Date: 14
Plan Prescribed Dose Per Fraction: 2 Gy
Plan Total Fractions Prescribed: 33
Plan Total Prescribed Dose: 66 Gy
Reference Point Dosage Given to Date: 28 Gy
Reference Point Session Dosage Given: 2 Gy
Session Number: 14

## 2022-10-19 LAB — CBC WITH DIFFERENTIAL (CANCER CENTER ONLY)
Abs Immature Granulocytes: 0.07 10*3/uL (ref 0.00–0.07)
Basophils Absolute: 0 10*3/uL (ref 0.0–0.1)
Basophils Relative: 1 %
Eosinophils Absolute: 0.1 10*3/uL (ref 0.0–0.5)
Eosinophils Relative: 3 %
HCT: 44.5 % (ref 36.0–46.0)
Hemoglobin: 14.3 g/dL (ref 12.0–15.0)
Immature Granulocytes: 4 %
Lymphocytes Relative: 34 %
Lymphs Abs: 0.6 10*3/uL — ABNORMAL LOW (ref 0.7–4.0)
MCH: 24.2 pg — ABNORMAL LOW (ref 26.0–34.0)
MCHC: 32.1 g/dL (ref 30.0–36.0)
MCV: 75.4 fL — ABNORMAL LOW (ref 80.0–100.0)
Monocytes Absolute: 0.4 10*3/uL (ref 0.1–1.0)
Monocytes Relative: 20 %
Neutro Abs: 0.7 10*3/uL — ABNORMAL LOW (ref 1.7–7.7)
Neutrophils Relative %: 38 %
Platelet Count: 141 10*3/uL — ABNORMAL LOW (ref 150–400)
RBC: 5.9 MIL/uL — ABNORMAL HIGH (ref 3.87–5.11)
RDW: 19.6 % — ABNORMAL HIGH (ref 11.5–15.5)
WBC Count: 1.8 10*3/uL — ABNORMAL LOW (ref 4.0–10.5)
nRBC: 0 % (ref 0.0–0.2)

## 2022-10-19 LAB — MAGNESIUM: Magnesium: 1.7 mg/dL (ref 1.7–2.4)

## 2022-10-19 LAB — CMP (CANCER CENTER ONLY)
ALT: 13 U/L (ref 0–44)
AST: 16 U/L (ref 15–41)
Albumin: 4.5 g/dL (ref 3.5–5.0)
Alkaline Phosphatase: 58 U/L (ref 38–126)
Anion gap: 7 (ref 5–15)
BUN: 16 mg/dL (ref 8–23)
CO2: 26 mmol/L (ref 22–32)
Calcium: 9.9 mg/dL (ref 8.9–10.3)
Chloride: 105 mmol/L (ref 98–111)
Creatinine: 0.53 mg/dL (ref 0.44–1.00)
GFR, Estimated: >60 mL/min (ref >60)
Glucose, Bld: 114 mg/dL — ABNORMAL HIGH (ref 70–99)
Potassium: 4.3 mmol/L (ref 3.5–5.1)
Sodium: 138 mmol/L (ref 135–145)
Total Bilirubin: 0.4 mg/dL (ref 0.3–1.2)
Total Protein: 7.5 g/dL (ref 6.5–8.1)

## 2022-10-19 MED FILL — Fosaprepitant Dimeglumine For IV Infusion 150 MG (Base Eq): INTRAVENOUS | Qty: 5 | Status: AC

## 2022-10-19 MED FILL — Dexamethasone Sodium Phosphate Inj 100 MG/10ML: INTRAMUSCULAR | Qty: 1 | Status: AC

## 2022-10-20 ENCOUNTER — Inpatient Hospital Stay: Payer: Medicare Other

## 2022-10-20 ENCOUNTER — Ambulatory Visit
Admission: RE | Admit: 2022-10-20 | Discharge: 2022-10-20 | Disposition: A | Discharge status: Home / Self Care | Payer: Medicare Other | Source: Ambulatory Visit | Attending: Radiation Oncology | Admitting: Radiation Oncology

## 2022-10-20 ENCOUNTER — Other Ambulatory Visit: Payer: Self-pay | Admitting: Hematology

## 2022-10-20 ENCOUNTER — Other Ambulatory Visit: Payer: Self-pay

## 2022-10-20 ENCOUNTER — Telehealth: Payer: Self-pay | Admitting: Hematology

## 2022-10-20 DIAGNOSIS — Z510 Encounter for antineoplastic radiation therapy: Secondary | ICD-10-CM | POA: Diagnosis not present

## 2022-10-20 DIAGNOSIS — C3490 Malignant neoplasm of unspecified part of unspecified bronchus or lung: Secondary | ICD-10-CM

## 2022-10-20 DIAGNOSIS — Z87891 Personal history of nicotine dependence: Secondary | ICD-10-CM | POA: Diagnosis not present

## 2022-10-20 DIAGNOSIS — C3412 Malignant neoplasm of upper lobe, left bronchus or lung: Secondary | ICD-10-CM | POA: Diagnosis not present

## 2022-10-20 DIAGNOSIS — Z5111 Encounter for antineoplastic chemotherapy: Secondary | ICD-10-CM | POA: Diagnosis not present

## 2022-10-20 LAB — RAD ONC ARIA SESSION SUMMARY
Course Elapsed Days: 22
Plan Fractions Treated to Date: 15
Plan Prescribed Dose Per Fraction: 2 Gy
Plan Total Fractions Prescribed: 33
Plan Total Prescribed Dose: 66 Gy
Reference Point Dosage Given to Date: 30 Gy
Reference Point Session Dosage Given: 2 Gy
Session Number: 15

## 2022-10-20 NOTE — Telephone Encounter (Signed)
Patient aware of all appointment updates

## 2022-10-20 NOTE — Progress Notes (Signed)
ANC 0.7, per Dr. Burr Medico hold today's treatment and reschedule for next Monday.

## 2022-10-21 ENCOUNTER — Inpatient Hospital Stay: Payer: Medicare Other

## 2022-10-21 ENCOUNTER — Ambulatory Visit
Admission: RE | Admit: 2022-10-21 | Discharge: 2022-10-21 | Disposition: A | Discharge status: Home / Self Care | Payer: Medicare Other | Source: Ambulatory Visit | Attending: Radiation Oncology | Admitting: Radiation Oncology

## 2022-10-21 ENCOUNTER — Other Ambulatory Visit: Payer: Self-pay

## 2022-10-21 ENCOUNTER — Other Ambulatory Visit: Payer: Self-pay | Admitting: Radiation Oncology

## 2022-10-21 DIAGNOSIS — C3412 Malignant neoplasm of upper lobe, left bronchus or lung: Secondary | ICD-10-CM | POA: Diagnosis not present

## 2022-10-21 DIAGNOSIS — Z510 Encounter for antineoplastic radiation therapy: Secondary | ICD-10-CM | POA: Diagnosis not present

## 2022-10-21 DIAGNOSIS — Z5111 Encounter for antineoplastic chemotherapy: Secondary | ICD-10-CM | POA: Diagnosis not present

## 2022-10-21 DIAGNOSIS — Z87891 Personal history of nicotine dependence: Secondary | ICD-10-CM | POA: Diagnosis not present

## 2022-10-21 LAB — RAD ONC ARIA SESSION SUMMARY
Course Elapsed Days: 23
Plan Fractions Treated to Date: 16
Plan Prescribed Dose Per Fraction: 2 Gy
Plan Total Fractions Prescribed: 33
Plan Total Prescribed Dose: 66 Gy
Reference Point Dosage Given to Date: 32 Gy
Reference Point Session Dosage Given: 2 Gy
Session Number: 16

## 2022-10-21 MED ORDER — SUCRALFATE 1 G PO TABS: 1.0000 g | ORAL_TABLET | Freq: Three times a day (TID) | ORAL | 2 refills | Status: DC

## 2022-10-22 ENCOUNTER — Inpatient Hospital Stay: Payer: Medicare Other | Admitting: Hematology

## 2022-10-22 ENCOUNTER — Ambulatory Visit
Admission: RE | Admit: 2022-10-22 | Discharge: 2022-10-22 | Disposition: A | Discharge status: Home / Self Care | Payer: Medicare Other | Source: Ambulatory Visit | Attending: Radiation Oncology | Admitting: Radiation Oncology

## 2022-10-22 ENCOUNTER — Inpatient Hospital Stay: Payer: Medicare Other

## 2022-10-22 ENCOUNTER — Other Ambulatory Visit: Payer: Self-pay

## 2022-10-22 DIAGNOSIS — Z5111 Encounter for antineoplastic chemotherapy: Secondary | ICD-10-CM | POA: Diagnosis not present

## 2022-10-22 DIAGNOSIS — Z87891 Personal history of nicotine dependence: Secondary | ICD-10-CM | POA: Diagnosis not present

## 2022-10-22 DIAGNOSIS — Z510 Encounter for antineoplastic radiation therapy: Secondary | ICD-10-CM | POA: Diagnosis not present

## 2022-10-22 DIAGNOSIS — C3412 Malignant neoplasm of upper lobe, left bronchus or lung: Secondary | ICD-10-CM | POA: Diagnosis not present

## 2022-10-22 LAB — RAD ONC ARIA SESSION SUMMARY
Course Elapsed Days: 24
Plan Fractions Treated to Date: 17
Plan Prescribed Dose Per Fraction: 2 Gy
Plan Total Fractions Prescribed: 33
Plan Total Prescribed Dose: 66 Gy
Reference Point Dosage Given to Date: 34 Gy
Reference Point Session Dosage Given: 2 Gy
Session Number: 17

## 2022-10-22 MED FILL — Dexamethasone Sodium Phosphate Inj 100 MG/10ML: INTRAMUSCULAR | Qty: 1 | Status: AC

## 2022-10-22 MED FILL — Fosaprepitant Dimeglumine For IV Infusion 150 MG (Base Eq): INTRAVENOUS | Qty: 5 | Status: AC

## 2022-10-22 NOTE — Progress Notes (Unsigned)
[facility]   Telephone:([phone] Fax:([phone]) [phone]   Clinic Follow up Note   Patient Care Team: Georgianne Fick, MD as PCP - General (Internal Medicine) Rollene Rotunda, MD as PCP - Cardiology (Cardiology) Claud Kelp, MD as Consulting Physician (General Surgery) Malachy Mood, MD as Consulting Physician (Hematology) Dorothy Puffer, MD as Consulting Physician (Radiation Oncology) Loa Socks, NP as Nurse Practitioner (Hematology and Oncology)  Date of Service:  10/25/2022  CHIEF COMPLAINT: f/u of small cell lung cancer      CURRENT THERAPY: concurrent chemo radiation with cisplatin and etoposide every 3 weeks    ASSESSMENT:  Monica [name] is a 77 y.o. female with   Small cell lung cancer (HCC) -[id number], stage III, limited stage  -found on screening CT chest on July 22, 2022 which showed suprahilar left upper lobe pulmonary mass and mediastinal adenopathy -PET scan from August 08, 2022 showed hypermetabolic left hilar lung mass with mediastinal and left supraclavicular nodal metastasis. 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 Brain MRI negative  -started concurrent chemo cisplatin/etoposide and radiation 09/29/2022 -She tolerated first cycle chemotherapy well. C2 chemo postponed last week due to cytopenia    PLAN: -Lab reviewed, adequate for treatment -Proceed with cycle 2 cisplatin and etoposide today -lab ,f/u weekly -Cycle 3 chemotherapy in 3 weeks -She is tolerating radiation well overall.      SUMMARY OF ONCOLOGIC HISTORY: Oncology History Overview Note  Cancer Staging Malignant neoplasm of upper-inner quadrant of left breast in female, estrogen receptor positive (HCC) Staging form: Breast, AJCC 8th Edition - Clinical stage from 02/16/2017: Stage IA (cT1b, cN0, cM0, G2, ER: Positive, PR: Positive, HER2: Negative) - Unsigned Staging comments: Staged at breast conference   - Pathologic stage from 03/16/2017: Stage IA (pT1b, pN0, cM0, G2, ER: Positive, PR: Positive, HER2: Negative, Oncotype DX score: 27) - Signed by Malachy Mood, MD on 04/24/2017     Malignant neoplasm of upper-inner quadrant of left breast in female, estrogen receptor positive (HCC)  02/09/2017 Initial Diagnosis   Malignant neoplasm of upper-inner quadrant of left breast in female, estrogen receptor positive (HCC)   02/09/2017 Initial Biopsy   Diagnosis Breast, left, needle core biopsy - INVASIVE DUCTAL CARCINOMA, 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 Derrell Lolling    03/16/2017 Pathology Results   PATHOLOGY REPORT Diagnosis 03/16/17 1. Breast, lumpectomy, Left - INVASIVE DUCTAL CARCINOMA, NOTTINGHAM GRADE 2 OF 3, 0.9 CM - DUCTAL CARCINOMA IN SITU - MARGINS UNINVOLVED BY CARCINOMA (0.3 CM POSTERIOR MARGIN) - PREVIOUS BIOPSY SITE CHANGES - SEE ONCOLOGY TABLE BELOW 2. Lymph node, sentinel, biopsy, Left axillary #1 - NO CARCINOMA IDENTIFIED IN ONE LYMPH NODE (0/1) 3. Lymph node, sentinel, biopsy, Left axillary #2 - NO CARCINOMA IDENTIFIED IN ONE LYMPH NODE (0/1) 4. Lymph node, sentinel, biopsy, Left axillary #3 - NO CARCINOMA IDENTIFIED IN ONE LYMPH NODE (0/1) 5. Lymph node, sentinel, biopsy, Left axillary #4 - NO CARCINOMA IDENTIFIED IN ONE LYMPH NODE (0/1)    03/16/2017 Oncotype testing   Recurrance score of 27 with a 18% distance recurrance in the net 10 years with Tamoxifen alone    05/19/2017 - 06/16/2017 Radiation Therapy  Radiation with Dr. Mitzi Hansen   06/2017 -  Anti-estrogen oral therapy   anastrozole 1 mg once a day starting late 06/2017     04/29/2020  Pathology Results   DIAGNOSIS:   BONE MARROW, ASPIRATE, CLOT, CORE:  -  Cellular marrow with trilineage hematopoiesis  -  No morphologic evidence of carcinoma or hematopoietic neoplasm  -  See microscopic description below   PERIPHERAL BLOOD:  -  Polycythemia and thrombocytopenia  -  See complete blood cell count   MICROSCOPIC DESCRIPTION:   PERIPHERAL BLOOD SMEAR: The peripheral blood has a polycythemia and  thrombocytopenia.  Leukocytes are morphologically unremarkable.   BONE MARROW ASPIRATE: Spicular, cellular and adequate for evaluation  Erythroid precursors: Orderly maturation without overt dysplasia  Granulocytic precursors: Orderly maturation without overt dysplasia  Megakaryocytes: Qualitatively and quantitatively unremarkable  Lymphocytes/plasma cells: No lymphocytosis or plasmacytosis    Small cell lung cancer (HCC)  09/14/2022 Cancer Staging   Staging form: Lung, AJCC 8th Edition - Clinical stage from 09/14/2022: Stage IIIB (cT2, cN3, cM0) - Signed by Malachy Mood, MD on 09/16/2022   09/16/2022 Initial Diagnosis   Small cell lung cancer (HCC)   09/29/2022 -  Chemotherapy   Patient is on Treatment Plan : LUNG SMALL CELL Cisplatin (80) D1 + Etoposide (100) D1-3 q21d        INTERVAL HISTORY:  Monica [name] is here for a follow up of small cell lung cancer     She was last seen by me on 10/15/2022 She presents to the clinic alone. Pt states she is losing her hair. Pt reports radiationis going good. Pt denies having fever and chills. She also states she had a heard time swalllowing hard food.   All other systems were reviewed with the patient and are negative.  MEDICAL HISTORY:  Past Medical History:  Diagnosis Date   CAD (coronary artery disease)    Cypher stent 09/2002   Cancer (HCC) 2018   left breast, radiation therapy   CHF (congestive heart failure) (HCC)    Depression    Diabetes mellitus (HCC)    Hyperlipidemia    Hypertension    Myocardial infarction  (HCC)    2003,went into cardiac shock   Osteoporosis    Seizures (HCC)    1st seizure age late 35's; most recent 07/28/17   Sinusitis     SURGICAL HISTORY: Past Surgical History:  Procedure Laterality Date   ABDOMINAL HYSTERECTOMY     APPENDECTOMY     BREAST LUMPECTOMY WITH RADIOACTIVE SEED AND SENTINEL LYMPH NODE BIOPSY Left 03/16/2017   Procedure: INJECT BLUE DYE LEFT BREAST, LEFT BREAST LUMPECTOMY WITH RADIOACTIVE SEED AND LEFT AXILLARY DEEP SENTINEL LYMPH NODE  BIOPSY ADJACENT TISSUE TRANSFER;  Surgeon: Claud Kelp, MD;  Location: [facility] OR;  Service: General;  Laterality: Left;   CARPAL TUNNEL RELEASE Bilateral    COLONOSCOPY WITH PROPOFOL N/A 05/30/2015   Procedure: COLONOSCOPY WITH PROPOFOL;  Surgeon: Jeani Hawking, MD;  Location: [facility];  Service: Endoscopy;  Laterality: N/A;   COLONOSCOPY WITH PROPOFOL N/A 06/09/2018   Procedure: COLONOSCOPY WITH PROPOFOL;  Surgeon: Jeani Hawking, MD;  Location: [facility];  Service: Endoscopy;  Laterality: N/A;   COLONOSCOPY WITH PROPOFOL N/A 07/31/2021   Procedure: COLONOSCOPY WITH PROPOFOL;  Surgeon: Jeani Hawking, MD;  Location: [facility];  Service: Endoscopy;  Laterality: N/A;   CORONARY ANGIOPLASTY WITH STENT PLACEMENT  10-02-2002   ESOPHAGOGASTRODUODENOSCOPY (EGD) WITH PROPOFOL N/A 07/31/2021   Procedure: ESOPHAGOGASTRODUODENOSCOPY (EGD) WITH PROPOFOL;  Surgeon: Jeani Hawking, MD;  Location: [facility];  Service: Endoscopy;  Laterality: N/A;   Exploratory abdominal     Bleeding in abdomen after tubal   IR IMAGING GUIDED PORT INSERTION  09/28/2022   POLYPECTOMY  06/09/2018   Procedure: POLYPECTOMY;  Surgeon: Jeani Hawking, MD;  Location: [facility];  Service: Endoscopy;;   POLYPECTOMY  07/31/2021   Procedure: POLYPECTOMY;  Surgeon: Jeani Hawking, MD;  Location: [facility];  Service: Endoscopy;;   TUBAL LIGATION     UMBILICAL HERNIA REPAIR      I have reviewed the social history and family history with the patient and they  are unchanged from previous note.  ALLERGIES:  is allergic to ace inhibitors, keflex [cephalexin], phenobarbital, sulfate, altace [ramipril], codeine, and dicyclomine hcl.  MEDICATIONS:  Current Outpatient Medications  Medication Sig Dispense Refill   acetaminophen (TYLENOL) 500 MG tablet Take 1,000 mg by mouth every 6 (six) hours as needed for moderate pain or headache.     albuterol (VENTOLIN HFA) 108 (90 Base) MCG/ACT inhaler INHALE 2 PUFFS BY MOUTH EVERY 6 HOURS AS NEEDED FOR WHEEZING OR  SHORTHNESS  OF  BREATH 9 g 11   anastrozole (ARIMIDEX) 1 MG tablet Take 1 tablet (1 mg total) by mouth daily. 90 tablet 1   aspirin EC 81 MG tablet Take 81 mg by mouth at bedtime.     atorvastatin (LIPITOR) 40 MG tablet Take 40 mg by mouth at bedtime.     B-D ULTRAFINE III SHORT PEN 31G X 8 MM MISC Inject into the skin as directed.     Calcium Carbonate-Vitamin D (CALTRATE 600+D PO) Take 1 tablet by mouth in the morning and at bedtime.     carvedilol (COREG) 3.125 MG tablet Take 3.125 mg by mouth 2 (two) times daily.      cetirizine (ZYRTEC) 10 MG tablet Take 10 mg by mouth daily as needed for allergies.     clonazePAM (KLONOPIN) 1 MG tablet Take 1 mg by mouth in the morning and at bedtime.     Fluticasone-Umeclidin-Vilant (TRELEGY ELLIPTA) 100-62.5-25 MCG/ACT AEPB INHALE 1 PUFF ONCE DAILY RINSE  MOUTH  AFTER 60 each 11   Insulin Detemir (LEVEMIR) 100 UNIT/ML Pen Inject 17-25 Units into the skin See admin instructions. Inject 25 units subcutaneously in the morning and inject 17 units subcutaneously at night     insulin lispro (HUMALOG) 100 UNIT/ML KwikPen Inject 7-9 Units into the skin See admin instructions. Inject 7 units in the morning, 7 units at lunch, and 9 units at night     ketorolac (ACULAR) 0.5 % ophthalmic solution Place 1 drop into the left eye 4 (four) times daily.     levETIRAcetam (KEPPRA) 500 MG tablet Take 1 tablet (500 mg total) by mouth 2 (two) times daily. 180 tablet 3    lidocaine-prilocaine (EMLA) cream Apply to affected area once 30 g 3   losartan (COZAAR) 50 MG tablet Take 25 mg by mouth at bedtime.     moxifloxacin (VIGAMOX) 0.5 % ophthalmic solution Place 1 drop into the left eye 4 (four) times daily.     nitroGLYCERIN (NITROSTAT) 0.4 MG SL tablet Place 1 tablet (0.4 mg total) under the tongue every 5 (five) minutes as needed for chest pain. 25 tablet 2   Omega-3 Fatty Acids (FISH OIL) 1000 MG CAPS Take 1,000 mg by mouth in the morning and at bedtime.     omeprazole (PRILOSEC) 20 MG capsule Take 20 mg by mouth in the morning.     ondansetron (ZOFRAN)  8 MG tablet Take 1 tablet (8 mg total) by mouth every 8 (eight) hours as needed for nausea or vomiting. Start on the third day after cisplatin. 30 tablet 1   ONETOUCH VERIO test strip USE THREE TIMES A DAY AND AS NEEDED FOR HYPOGLYCEMIA     OXYGEN Inhale 2 L into the lungs at bedtime.     pioglitazone (ACTOS) 15 MG tablet Take 15 mg by mouth at bedtime.     Polyethyl Glycol-Propyl Glycol 0.4-0.3 % SOLN Place 1 drop into both eyes 2 (two) times daily as needed (itchy/irritated eyes.).     prednisoLONE acetate (PRED FORTE) 1 % ophthalmic suspension Place 1 drop into the left eye 4 (four) times daily.     prochlorperazine (COMPAZINE) 10 MG tablet Take 1 tablet (10 mg total) by mouth every 6 (six) hours as needed for nausea or vomiting. 30 tablet 1   risedronate (ACTONEL) 150 MG tablet Take 150 mg by mouth every 30 (thirty) days.     sertraline (ZOLOFT) 100 MG tablet Take 100 mg by mouth at bedtime.     sucralfate (CARAFATE) 1 g tablet Take 1 tablet (1 g total) by mouth 4 (four) times daily -  with meals and at bedtime. 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     No current facility-administered medications for this visit.    PHYSICAL EXAMINATION: ECOG PERFORMANCE STATUS: 1 - Symptomatic but completely  ambulatory  There were no vitals filed for this visit. Wt Readings from Last 3 Encounters:  10/25/22 103 lb 4 oz (46.8 kg)  10/20/22 104 lb 8 oz (47.4 kg)  10/15/22 104 lb 12.8 oz (47.5 kg)     LUNGS:(-) clear to auscultation and percussion with normal breathing effort HEART:(-) regular rate & rhythm and no murmurs and no (-)lower extremity edema ABDOMEN:(-)abdomen soft, non-tender and normal bowel sounds  LABORATORY DATA:  I have reviewed the data as listed    Latest Ref Rng & Units 10/25/2022    8:20 AM 10/19/2022   11:19 AM 10/15/2022    1:53 PM  CBC  WBC 4.0 - 10.5 K/uL 3.6  1.8  0.9   Hemoglobin 12.0 - 15.0 g/dL 12.7  14.3  13.2   Hematocrit 36.0 - 46.0 % 38.5  44.5  40.1   Platelets 150 - 400 K/uL 151  141  69         Latest Ref Rng & Units 10/19/2022   11:19 AM 10/15/2022    1:53 PM 10/08/2022    1:49 PM  CMP  Glucose 70 - 99 mg/dL 114  79  74   BUN 8 - 23 mg/dL 16  8  11    Creatinine 0.44 - 1.00 mg/dL 0.53  0.42  0.36   Sodium 135 - 145 mmol/L 138  137  138   Potassium 3.5 - 5.1 mmol/L 4.3  4.1  4.0   Chloride 98 - 111 mmol/L 105  103  103   CO2 22 - 32 mmol/L 26  27  29    Calcium 8.9 - 10.3 mg/dL 9.9  9.5  9.9   Total Protein 6.5 - 8.1 g/dL 7.5  6.7  6.9   Total Bilirubin 0.3 - 1.2 mg/dL 0.4  0.5  0.4   Alkaline Phos 38 - 126 U/L 58  47  47   AST 15 - 41 U/L 16  16  15   ALT 0 - 44 U/L 13  12  17        RADIOGRAPHIC STUDIES: I have personally reviewed the radiological images as listed and agreed with the findings in the report. No results found.    Orders Placed This Encounter  Procedures   CBC with Differential (Cancer Center Only)    Standing Status:   Future    Standing Expiration Date:   11/16/2023   CMP (Cancer Center only)    Standing Status:   Future    Standing Expiration Date:   11/16/2023   Magnesium    Standing Status:   Future    Standing Expiration Date:   11/16/2023   All questions were answered. The patient knows to call the clinic with any  problems, questions or concerns. No barriers to learning was detected. The total time spent in the appointment was 30 minutes.     01/14/2024, MD 10/25/2022   10/27/2022, CMA, am acting as scribe for Carolin Coy, MD.   I have reviewed the above documentation for accuracy and completeness, and I agree with the above.

## 2022-10-24 NOTE — Assessment & Plan Note (Signed)
-  [id number], stage III, limited stage  -found on screening CT chest on July 22, 2022 which showed suprahilar left upper lobe pulmonary mass and mediastinal adenopathy -PET scan from August 08, 2022 showed hypermetabolic left hilar lung mass with mediastinal and left supraclavicular nodal metastasis. 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 Brain MRI negative  -started concurrent chemo cisplatin/etoposide and radiation 09/29/2022 -She tolerated first cycle chemotherapy well. C2 chemo postponed last week due to cytopenia

## 2022-10-25 ENCOUNTER — Inpatient Hospital Stay: Payer: Medicare Other

## 2022-10-25 ENCOUNTER — Ambulatory Visit
Admission: RE | Admit: 2022-10-25 | Discharge: 2022-10-25 | Disposition: A | Discharge status: Home / Self Care | Payer: Medicare Other | Source: Ambulatory Visit | Attending: Radiation Oncology | Admitting: Radiation Oncology

## 2022-10-25 ENCOUNTER — Other Ambulatory Visit: Payer: Self-pay

## 2022-10-25 ENCOUNTER — Encounter: Payer: Self-pay | Admitting: Hematology

## 2022-10-25 ENCOUNTER — Other Ambulatory Visit: Payer: Self-pay | Admitting: Hematology

## 2022-10-25 ENCOUNTER — Inpatient Hospital Stay: Payer: Medicare Other | Admitting: Hematology

## 2022-10-25 VITALS — BP 119/74 | HR 83 | Temp 97.8°F | Resp 18 | Ht 65.0 in | Wt 103.2 lb

## 2022-10-25 DIAGNOSIS — C3490 Malignant neoplasm of unspecified part of unspecified bronchus or lung: Secondary | ICD-10-CM

## 2022-10-25 DIAGNOSIS — Z510 Encounter for antineoplastic radiation therapy: Secondary | ICD-10-CM | POA: Diagnosis not present

## 2022-10-25 DIAGNOSIS — E1165 Type 2 diabetes mellitus with hyperglycemia: Secondary | ICD-10-CM | POA: Diagnosis not present

## 2022-10-25 DIAGNOSIS — Z170 Estrogen receptor positive status [ER+]: Secondary | ICD-10-CM

## 2022-10-25 DIAGNOSIS — Z5111 Encounter for antineoplastic chemotherapy: Secondary | ICD-10-CM | POA: Diagnosis not present

## 2022-10-25 DIAGNOSIS — Z95828 Presence of other vascular implants and grafts: Secondary | ICD-10-CM

## 2022-10-25 DIAGNOSIS — E1121 Type 2 diabetes mellitus with diabetic nephropathy: Secondary | ICD-10-CM | POA: Diagnosis not present

## 2022-10-25 DIAGNOSIS — Z794 Long term (current) use of insulin: Secondary | ICD-10-CM | POA: Diagnosis not present

## 2022-10-25 DIAGNOSIS — Z87891 Personal history of nicotine dependence: Secondary | ICD-10-CM | POA: Diagnosis not present

## 2022-10-25 DIAGNOSIS — D751 Secondary polycythemia: Secondary | ICD-10-CM

## 2022-10-25 DIAGNOSIS — C3412 Malignant neoplasm of upper lobe, left bronchus or lung: Secondary | ICD-10-CM | POA: Diagnosis not present

## 2022-10-25 LAB — CBC WITH DIFFERENTIAL (CANCER CENTER ONLY)
Abs Immature Granulocytes: 0.04 10*3/uL (ref 0.00–0.07)
Basophils Absolute: 0 10*3/uL (ref 0.0–0.1)
Basophils Relative: 1 %
Eosinophils Absolute: 0 10*3/uL (ref 0.0–0.5)
Eosinophils Relative: 1 %
HCT: 38.5 % (ref 36.0–46.0)
Hemoglobin: 12.7 g/dL (ref 12.0–15.0)
Immature Granulocytes: 1 %
Lymphocytes Relative: 14 %
Lymphs Abs: 0.5 10*3/uL — ABNORMAL LOW (ref 0.7–4.0)
MCH: 24.9 pg — ABNORMAL LOW (ref 26.0–34.0)
MCHC: 33 g/dL (ref 30.0–36.0)
MCV: 75.5 fL — ABNORMAL LOW (ref 80.0–100.0)
Monocytes Absolute: 0.5 10*3/uL (ref 0.1–1.0)
Monocytes Relative: 14 %
Neutro Abs: 2.5 10*3/uL (ref 1.7–7.7)
Neutrophils Relative %: 69 %
Platelet Count: 151 10*3/uL (ref 150–400)
RBC: 5.1 MIL/uL (ref 3.87–5.11)
RDW: 19.4 % — ABNORMAL HIGH (ref 11.5–15.5)
WBC Count: 3.6 10*3/uL — ABNORMAL LOW (ref 4.0–10.5)
nRBC: 0 % (ref 0.0–0.2)

## 2022-10-25 LAB — FERRITIN: Ferritin: 18 ng/mL (ref 11–307)

## 2022-10-25 LAB — RAD ONC ARIA SESSION SUMMARY
Course Elapsed Days: 27
Plan Fractions Treated to Date: 18
Plan Prescribed Dose Per Fraction: 2 Gy
Plan Total Fractions Prescribed: 33
Plan Total Prescribed Dose: 66 Gy
Reference Point Dosage Given to Date: 36 Gy
Reference Point Session Dosage Given: 2 Gy
Session Number: 18

## 2022-10-25 LAB — CMP (CANCER CENTER ONLY)
ALT: 11 U/L (ref 0–44)
AST: 15 U/L (ref 15–41)
Albumin: 3.9 g/dL (ref 3.5–5.0)
Alkaline Phosphatase: 45 U/L (ref 38–126)
Anion gap: 6 (ref 5–15)
BUN: 9 mg/dL (ref 8–23)
CO2: 27 mmol/L (ref 22–32)
Calcium: 9.3 mg/dL (ref 8.9–10.3)
Chloride: 103 mmol/L (ref 98–111)
Creatinine: 0.4 mg/dL — ABNORMAL LOW (ref 0.44–1.00)
GFR, Estimated: >60 mL/min (ref >60)
Glucose, Bld: 140 mg/dL — ABNORMAL HIGH (ref 70–99)
Potassium: 3.9 mmol/L (ref 3.5–5.1)
Sodium: 136 mmol/L (ref 135–145)
Total Bilirubin: 0.4 mg/dL (ref 0.3–1.2)
Total Protein: 6.6 g/dL (ref 6.5–8.1)

## 2022-10-25 LAB — SAMPLE TO BLOOD BANK

## 2022-10-25 LAB — MAGNESIUM: Magnesium: 1.6 mg/dL — ABNORMAL LOW (ref 1.7–2.4)

## 2022-10-25 MED ORDER — SODIUM CHLORIDE 0.9 % IV SOLN: Freq: Once | INTRAVENOUS | Status: AC

## 2022-10-25 MED ORDER — HEPARIN SOD (PORK) LOCK FLUSH 100 UNIT/ML IV SOLN
500.0000 [IU] | Freq: Once | INTRAVENOUS | Status: AC | PRN
  Administered 2022-10-25: 500 [IU]

## 2022-10-25 MED ORDER — PALONOSETRON HCL INJECTION 0.25 MG/5ML
0.2500 mg | Freq: Once | INTRAVENOUS | Status: AC
  Administered 2022-10-25: 0.25 mg via INTRAVENOUS
  Filled 2022-10-25: qty 5

## 2022-10-25 MED ORDER — MAGNESIUM OXIDE -MG SUPPLEMENT 400 (240 MG) MG PO TABS: 400.0000 mg | ORAL_TABLET | Freq: Every day | ORAL | 1 refills | Status: DC

## 2022-10-25 MED ORDER — SODIUM CHLORIDE 0.9% FLUSH
10.0000 mL | INTRAVENOUS | Status: DC | PRN
  Administered 2022-10-25: 10 mL

## 2022-10-25 MED ORDER — SODIUM CHLORIDE 0.9 % IV SOLN
150.0000 mg | Freq: Once | INTRAVENOUS | Status: AC
  Administered 2022-10-25: 150 mg via INTRAVENOUS
  Filled 2022-10-25: qty 5
  Filled 2022-10-25: qty 150

## 2022-10-25 MED ORDER — SODIUM CHLORIDE 0.9 % IV SOLN
80.0000 mg/m2 | Freq: Once | INTRAVENOUS | Status: AC
  Administered 2022-10-25: 118 mg via INTRAVENOUS
  Filled 2022-10-25: qty 118

## 2022-10-25 MED ORDER — POTASSIUM CHLORIDE IN NACL 20-0.9 MEQ/L-% IV SOLN
Freq: Once | INTRAVENOUS | Status: AC
  Filled 2022-10-25: qty 1000

## 2022-10-25 MED ORDER — SODIUM CHLORIDE 0.9 % IV SOLN
10.0000 mg | Freq: Once | INTRAVENOUS | Status: AC
  Administered 2022-10-25: 10 mg via INTRAVENOUS
  Filled 2022-10-25: qty 1
  Filled 2022-10-25: qty 10

## 2022-10-25 MED ORDER — SODIUM CHLORIDE 0.9% FLUSH
10.0000 mL | INTRAVENOUS | Status: AC | PRN
  Administered 2022-10-25: 10 mL

## 2022-10-25 MED ORDER — SODIUM CHLORIDE 0.9 % IV SOLN
100.0000 mg/m2 | Freq: Once | INTRAVENOUS | Status: AC
  Administered 2022-10-25: 150 mg via INTRAVENOUS
  Filled 2022-10-25: qty 7.5

## 2022-10-25 MED ORDER — MAGNESIUM SULFATE 2 GM/50ML IV SOLN
2.0000 g | Freq: Once | INTRAVENOUS | Status: AC
  Administered 2022-10-25: 2 g via INTRAVENOUS
  Filled 2022-10-25: qty 50

## 2022-10-25 MED FILL — Dexamethasone Sodium Phosphate Inj 100 MG/10ML: INTRAMUSCULAR | Qty: 1 | Status: AC

## 2022-10-25 NOTE — Progress Notes (Signed)
Pt. Magnesium 1.6 mg/dL. Dr. Mosetta Putt notified and she sent in new prescription to pharmacy. Pt. received Magnesium 2 gram IV here prior to treatment and instructed to pick up from pharmacy and to take oral dose Magnesium as prescribed. Pt. states she understands.

## 2022-10-25 NOTE — Patient Instructions (Addendum)
[facility]  Discharge Instructions: Thank you for choosing [facility] to provide your oncology and hematology care.   If you have a lab appointment with the Cancer Center, please go directly to the Cancer Center and check in at the registration area.   Wear comfortable clothing and clothing appropriate for easy access to any Portacath or PICC line.   We strive to give you quality time with your provider. You may need to reschedule your appointment if you arrive late (15 or more minutes).  Arriving late affects you and other patients whose appointments are after yours.  Also, if you miss three or more appointments without notifying the office, you may be dismissed from the clinic at the provider's discretion.      For prescription refill requests, have your pharmacy contact our office and allow 72 hours for refills to be completed.    Today you received the following chemotherapy and/or immunotherapy agents: Cisplatin and Etoposide   To help prevent nausea and vomiting after your treatment, we encourage you to take your nausea medication as directed.  BELOW ARE SYMPTOMS THAT SHOULD BE REPORTED IMMEDIATELY: *FEVER GREATER THAN 100.4 F (38 C) OR HIGHER *CHILLS OR SWEATING *NAUSEA AND VOMITING THAT IS NOT CONTROLLED WITH YOUR NAUSEA MEDICATION *UNUSUAL SHORTNESS OF BREATH *UNUSUAL BRUISING OR BLEEDING *URINARY PROBLEMS (pain or burning when urinating, or frequent urination) *BOWEL PROBLEMS (unusual diarrhea, constipation, pain near the anus) TENDERNESS IN MOUTH AND THROAT WITH OR WITHOUT PRESENCE OF ULCERS (sore throat, sores in mouth, or a toothache) UNUSUAL RASH, SWELLING OR PAIN  UNUSUAL VAGINAL DISCHARGE OR ITCHING   Items with * indicate a potential emergency and should be followed up as soon as possible or go to the Emergency Department if any problems should occur.  Please show the CHEMOTHERAPY ALERT CARD or IMMUNOTHERAPY ALERT CARD at  check-in to the Emergency Department and triage nurse.  Should you have questions after your visit or need to cancel or reschedule your appointment, please contact [facility]  Dept: [phone]  and follow the prompts.  Office hours are 8:00 a.m. to 4:30 p.m. Monday - Friday. Please note that voicemails left after 4:00 p.m. may not be returned until the following business day.  We are closed weekends and major holidays. You have access to a nurse at all times for urgent questions. Please call the main number to the clinic Dept: [phone] and follow the prompts.   For any non-urgent questions, you may also contact your provider using MyChart. We now offer e-Visits for anyone 51 and older to request care online for non-urgent symptoms. For details visit [email or web address].   Also download the MyChart app! Go to the app store, search "MyChart", open the app, select [place], and log in with your MyChart username and password.  Cisplatin Injection What is this medication? CISPLATIN (SIS pla tin) treats some types of cancer. It works by slowing down the growth of cancer cells. This medicine may be used for other purposes; ask your health care provider or pharmacist if you have questions. COMMON BRAND NAME(S): Platinol, Platinol -AQ What should I tell my care team before I take this medication? They need to know if you have any of these conditions: Eye disease, vision problems Hearing problems Kidney disease Low blood counts, such as low white cells, platelets, or red blood cells Tingling of the fingers or toes, or other nerve disorder An unusual or allergic reaction to cisplatin,  carboplatin, oxaliplatin, other medications, foods, dyes, or preservatives If you or your partner are pregnant or trying to get pregnant Breast-feeding How should I use this medication? This medication is injected into a vein. It is given by your care team in a hospital or  clinic setting. Talk to your care team about the use of this medication in children. Special care may be needed. Overdosage: If you think you have taken too much of this medicine contact a poison control center or emergency room at once. NOTE: This medicine is only for you. Do not share this medicine with others. What if I miss a dose? Keep appointments for follow-up doses. It is important not to miss your dose. Call your care team if you are unable to keep an appointment. What may interact with this medication? Do not take this medication with any of the following: Live virus vaccines This medication may also interact with the following: Certain antibiotics, such as amikacin, gentamicin, neomycin, polymyxin B, streptomycin, tobramycin, vancomycin Foscarnet This list may not describe all possible interactions. Give your health care provider a list of all the medicines, herbs, non-prescription drugs, or dietary supplements you use. Also tell them if you smoke, drink alcohol, or use illegal drugs. Some items may interact with your medicine. What should I watch for while using this medication? Your condition will be monitored carefully while you are receiving this medication. You may need blood work done while taking this medication. This medication may make you feel generally unwell. This is not uncommon, as chemotherapy can affect healthy cells as well as cancer cells. Report any side effects. Continue your course of treatment even though you feel ill unless your care team tells you to stop. This medication may increase your risk of getting an infection. Call your care team for advice if you get a fever, chills, sore throat, or other symptoms of a cold or flu. Do not treat yourself. Try to avoid being around people who are sick. Avoid taking medications that contain aspirin, acetaminophen, ibuprofen, naproxen, or ketoprofen unless instructed by your care team. These medications may hide a  fever. This medication may increase your risk to bruise or bleed. Call your care team if you notice any unusual bleeding. Be careful brushing or flossing your teeth or using a toothpick because you may get an infection or bleed more easily. If you have any dental work done, tell your dentist you are receiving this medication. Drink fluids as directed while you are taking this medication. This will help protect your kidneys. Call your care team if you get diarrhea. Do not treat yourself. Talk to your care team if you or your partner wish to become pregnant or think you might be pregnant. This medication can cause serious birth defects if taken during pregnancy and for 14 months after the last dose. A negative pregnancy test is required before starting this medication. A reliable form of contraception is recommended while taking this medication and for 14 months after the last dose. Talk to your care team about effective forms of contraception. Do not father a child while taking this medication and for 11 months after the last dose. Use a condom during sex during this time period. Do not breast-feed while taking this medication. This medication may cause infertility. Talk to your care team if you are concerned about your fertility. What side effects may I notice from receiving this medication? Side effects that you should report to your care team as soon as possible:  Allergic reactions--skin rash, itching, hives, swelling of the face, lips, tongue, or throat Eye pain, change in vision, vision loss Hearing loss, ringing in ears Infection--fever, chills, cough, sore throat, wounds that don't heal, pain or trouble when passing urine, general feeling of discomfort or being unwell Kidney injury--decrease in the amount of urine, swelling of the ankles, hands, or feet Low red blood cell level--unusual weakness or fatigue, dizziness, headache, trouble breathing Painful swelling, warmth, or redness of the skin,  blisters or sores at the infusion site Pain, tingling, or numbness in the hands or feet Unusual bruising or bleeding Side effects that usually do not require medical attention (report to your care team if they continue or are bothersome): Hair loss Nausea Vomiting This list may not describe all possible side effects. Call your doctor for medical advice about side effects. You may report side effects to FDA at 1-800-FDA-1088. Where should I keep my medication? This medication is given in a hospital or clinic. It will not be stored at home. NOTE: This sheet is a summary. It may not cover all possible information. If you have questions about this medicine, talk to your doctor, pharmacist, or health care provider.  2023 Elsevier/Gold Standard (2022-01-22 00:00:00)  Etoposide Injection What is this medication? ETOPOSIDE (e toe POE side) treats some types of cancer. It works by slowing down the growth of cancer cells. This medicine may be used for other purposes; ask your health care provider or pharmacist if you have questions. COMMON BRAND NAME(S): Etopophos, Toposar, VePesid What should I tell my care team before I take this medication? They need to know if you have any of these conditions: Infection Kidney disease Liver disease Low blood counts, such as low white cell, platelet, red cell counts An unusual or allergic reaction to etoposide, other medications, foods, dyes, or preservatives If you or your partner are pregnant or trying to get pregnant Breastfeeding How should I use this medication? This medication is injected into a vein. It is given by your care team in a hospital or clinic setting. Talk to your care team about the use of this medication in children. Special care may be needed. Overdosage: If you think you have taken too much of this medicine contact a poison control center or emergency room at once. NOTE: This medicine is only for you. Do not share this medicine with  others. What if I miss a dose? Keep appointments for follow-up doses. It is important not to miss your dose. Call your care team if you are unable to keep an appointment. What may interact with this medication? Warfarin This list may not describe all possible interactions. Give your health care provider a list of all the medicines, herbs, non-prescription drugs, or dietary supplements you use. Also tell them if you smoke, drink alcohol, or use illegal drugs. Some items may interact with your medicine. What should I watch for while using this medication? Your condition will be monitored carefully while you are receiving this medication. This medication may make you feel generally unwell. This is not uncommon as chemotherapy can affect healthy cells as well as cancer cells. Report any side effects. Continue your course of treatment even though you feel ill unless your care team tells you to stop. This medication can cause serious side effects. To reduce the risk, your care team may give you other medications to take before receiving this one. Be sure to follow the directions from your care team. This medication may increase your risk  of getting an infection. Call your care team for advice if you get a fever, chills, sore throat, or other symptoms of a cold or flu. Do not treat yourself. Try to avoid being around people who are sick. This medication may increase your risk to bruise or bleed. Call your care team if you notice any unusual bleeding. Talk to your care team about your risk of cancer. You may be more at risk for certain types of cancers if you take this medication. Talk to your care team if you may be pregnant. Serious birth defects can occur if you take this medication during pregnancy and for 6 months after the last dose. You will need a negative pregnancy test before starting this medication. Contraception is recommended while taking this medication and for 6 months after the last dose. Your  care team can help you find the option that works for you. If your partner can get pregnant, use a condom during sex while taking this medication and for 4 months after the last dose. Do not breastfeed while taking this medication. This medication may cause infertility. Talk to your care team if you are concerned about your fertility. What side effects may I notice from receiving this medication? Side effects that you should report to your care team as soon as possible: Allergic reactions--skin rash, itching, hives, swelling of the face, lips, tongue, or throat Infection--fever, chills, cough, sore throat, wounds that don't heal, pain or trouble when passing urine, general feeling of discomfort or being unwell Low red blood cell level--unusual weakness or fatigue, dizziness, headache, trouble breathing Unusual bruising or bleeding Side effects that usually do not require medical attention (report to your care team if they continue or are bothersome): Diarrhea Fatigue Hair loss Loss of appetite Nausea Vomiting This list may not describe all possible side effects. Call your doctor for medical advice about side effects. You may report side effects to FDA at 1-800-FDA-1088. Where should I keep my medication? This medication is given in a hospital or clinic. It will not be stored at home. NOTE: This sheet is a summary. It may not cover all possible information. If you have questions about this medicine, talk to your doctor, pharmacist, or health care provider.  2023 Elsevier/Gold Standard (2007-11-18 00:00:00)   **Take oral Magnesium as prescribed. Hypomagnesemia Hypomagnesemia is a condition in which the level of magnesium in the blood is too low. Magnesium is a mineral that is found in many foods. It is used in many different processes in the body. Hypomagnesemia can affect every organ in the body. In severe cases, it can cause life-threatening problems. What are the causes? This condition  may be caused by: Not getting enough magnesium in your diet or not having enough healthy foods to eat (malnutrition). Problems with magnesium absorption in the intestines. Dehydration. Excessive use of alcohol. Vomiting. Severe or long-term (chronic) diarrhea. Some medicines, including medicines that make you urinate more often (diuretics). Certain diseases, such as kidney disease, diabetes, celiac disease, and overactive thyroid. What are the signs or symptoms? Symptoms of this condition include: Loss of appetite, nausea, and vomiting. Involuntary shaking or trembling of a body part (tremor). Muscle weakness or tingling in the arms and legs. Sudden tightening of muscles (muscle spasms). Confusion. Psychiatric issues, such as: Depression and irritability. Psychosis. A feeling of fluttering of the heart (palpitations). Seizures. These symptoms are more severe if magnesium levels drop suddenly. How is this diagnosed? This condition may be diagnosed based on: Your symptoms  and medical history. A physical exam. Blood and urine tests. How is this treated? Treatment depends on the cause and the severity of the condition. It may be treated by: Taking a magnesium supplement. This can be taken in pill form. If the condition is severe, magnesium is usually given through an IV. Making changes to your diet. You may be directed to eat foods that have a lot of magnesium, such as green leafy vegetables, peas, beans, and nuts. Not drinking alcohol. If you are struggling not to drink, ask your health care provider for help. Follow these instructions at home: Eating and drinking     Make sure that your diet includes foods with magnesium. Foods that have a lot of magnesium in them include: Green leafy vegetables, such as spinach and broccoli. Beans and peas. Nuts and seeds, such as almonds and sunflower seeds. Whole grains, such as whole grain bread and fortified cereals. Drink fluids that  contain salts and minerals (electrolytes), such as sports drinks, when you are active. Do not drink alcohol. General instructions Take over-the-counter and prescription medicines only as told by your health care provider. Take magnesium supplements as directed if your health care provider tells you to take them. Have your magnesium levels monitored as told by your health care provider. Keep all follow-up visits. This is important. Contact a health care provider if: You get worse instead of better. Your symptoms return. Get help right away if: You develop severe muscle weakness. You have trouble breathing. You feel that your heart is racing. These symptoms may represent a serious problem that is an emergency. Do not wait to see if the symptoms will go away. Get medical help right away. Call your local emergency services (911 in the [place] Do not drive yourself to the hospital. Summary Hypomagnesemia is a condition in which the level of magnesium in the blood is too low. Hypomagnesemia can affect every organ in the body. Treatment may include eating more foods that contain magnesium, taking magnesium supplements, and not drinking alcohol. Have your magnesium levels monitored as told by your health care provider. This information is not intended to replace advice given to you by your health care provider. Make sure you discuss any questions you have with your health care provider. Document Revised: 02/24/2021 Document Reviewed: 02/24/2021 Elsevier Patient Education  2023 [employer].

## 2022-10-26 ENCOUNTER — Other Ambulatory Visit: Payer: Self-pay

## 2022-10-26 ENCOUNTER — Ambulatory Visit
Admission: RE | Admit: 2022-10-26 | Discharge: 2022-10-26 | Disposition: A | Discharge status: Home / Self Care | Payer: Medicare Other | Source: Ambulatory Visit | Attending: Radiation Oncology | Admitting: Radiation Oncology

## 2022-10-26 ENCOUNTER — Inpatient Hospital Stay: Payer: Medicare Other

## 2022-10-26 VITALS — BP 126/71 | HR 65 | Temp 97.5°F | Resp 18

## 2022-10-26 DIAGNOSIS — Z5111 Encounter for antineoplastic chemotherapy: Secondary | ICD-10-CM | POA: Diagnosis not present

## 2022-10-26 DIAGNOSIS — Z510 Encounter for antineoplastic radiation therapy: Secondary | ICD-10-CM | POA: Diagnosis not present

## 2022-10-26 DIAGNOSIS — C3412 Malignant neoplasm of upper lobe, left bronchus or lung: Secondary | ICD-10-CM | POA: Diagnosis not present

## 2022-10-26 DIAGNOSIS — Z87891 Personal history of nicotine dependence: Secondary | ICD-10-CM | POA: Diagnosis not present

## 2022-10-26 DIAGNOSIS — C3490 Malignant neoplasm of unspecified part of unspecified bronchus or lung: Secondary | ICD-10-CM

## 2022-10-26 LAB — RAD ONC ARIA SESSION SUMMARY
Course Elapsed Days: 28
Plan Fractions Treated to Date: 19
Plan Prescribed Dose Per Fraction: 2 Gy
Plan Total Fractions Prescribed: 33
Plan Total Prescribed Dose: 66 Gy
Reference Point Dosage Given to Date: 38 Gy
Reference Point Session Dosage Given: 2 Gy
Session Number: 19

## 2022-10-26 MED ORDER — HEPARIN SOD (PORK) LOCK FLUSH 100 UNIT/ML IV SOLN
500.0000 [IU] | Freq: Once | INTRAVENOUS | Status: AC | PRN
  Administered 2022-10-26: 500 [IU]

## 2022-10-26 MED ORDER — SODIUM CHLORIDE 0.9 % IV SOLN
10.0000 mg | Freq: Once | INTRAVENOUS | Status: AC
  Administered 2022-10-26: 10 mg via INTRAVENOUS
  Filled 2022-10-26: qty 10
  Filled 2022-10-26: qty 1

## 2022-10-26 MED ORDER — SODIUM CHLORIDE 0.9% FLUSH
10.0000 mL | INTRAVENOUS | Status: DC | PRN
  Administered 2022-10-26: 10 mL

## 2022-10-26 MED ORDER — SODIUM CHLORIDE 0.9 % IV SOLN: Freq: Once | INTRAVENOUS | Status: AC

## 2022-10-26 MED ORDER — SODIUM CHLORIDE 0.9 % IV SOLN
100.0000 mg/m2 | Freq: Once | INTRAVENOUS | Status: AC
  Administered 2022-10-26: 150 mg via INTRAVENOUS
  Filled 2022-10-26: qty 7.5

## 2022-10-26 MED FILL — Dexamethasone Sodium Phosphate Inj 100 MG/10ML: INTRAMUSCULAR | Qty: 1 | Status: AC

## 2022-10-26 NOTE — Patient Instructions (Signed)
[facility]  Discharge Instructions: Thank you for choosing [facility] to provide your oncology and hematology care.   If you have a lab appointment with the Cancer Center, please go directly to the Cancer Center and check in at the registration area.   Wear comfortable clothing and clothing appropriate for easy access to any Portacath or PICC line.   We strive to give you quality time with your provider. You may need to reschedule your appointment if you arrive late (15 or more minutes).  Arriving late affects you and other patients whose appointments are after yours.  Also, if you miss three or more appointments without notifying the office, you may be dismissed from the clinic at the provider's discretion.      For prescription refill requests, have your pharmacy contact our office and allow 72 hours for refills to be completed.    Today you received the following chemotherapy and/or immunotherapy agents: etoposide      To help prevent nausea and vomiting after your treatment, we encourage you to take your nausea medication as directed.  BELOW ARE SYMPTOMS THAT SHOULD BE REPORTED IMMEDIATELY: *FEVER GREATER THAN 100.4 F (38 C) OR HIGHER *CHILLS OR SWEATING *NAUSEA AND VOMITING THAT IS NOT CONTROLLED WITH YOUR NAUSEA MEDICATION *UNUSUAL SHORTNESS OF BREATH *UNUSUAL BRUISING OR BLEEDING *URINARY PROBLEMS (pain or burning when urinating, or frequent urination) *BOWEL PROBLEMS (unusual diarrhea, constipation, pain near the anus) TENDERNESS IN MOUTH AND THROAT WITH OR WITHOUT PRESENCE OF ULCERS (sore throat, sores in mouth, or a toothache) UNUSUAL RASH, SWELLING OR PAIN  UNUSUAL VAGINAL DISCHARGE OR ITCHING   Items with * indicate a potential emergency and should be followed up as soon as possible or go to the Emergency Department if any problems should occur.  Please show the CHEMOTHERAPY ALERT CARD or IMMUNOTHERAPY ALERT CARD at check-in to  the Emergency Department and triage nurse.  Should you have questions after your visit or need to cancel or reschedule your appointment, please contact [facility]  Dept: [phone]  and follow the prompts.  Office hours are 8:00 a.m. to 4:30 p.m. Monday - Friday. Please note that voicemails left after 4:00 p.m. may not be returned until the following business day.  We are closed weekends and major holidays. You have access to a nurse at all times for urgent questions. Please call the main number to the clinic Dept: [phone] and follow the prompts.   For any non-urgent questions, you may also contact your provider using MyChart. We now offer e-Visits for anyone 54 and older to request care online for non-urgent symptoms. For details visit [email or web address].   Also download the MyChart app! Go to the app store, search "MyChart", open the app, select [place], and log in with your MyChart username and password.

## 2022-10-27 ENCOUNTER — Ambulatory Visit
Admission: RE | Admit: 2022-10-27 | Discharge: 2022-10-27 | Disposition: A | Discharge status: Home / Self Care | Payer: Medicare Other | Source: Ambulatory Visit | Attending: Radiation Oncology | Admitting: Radiation Oncology

## 2022-10-27 ENCOUNTER — Other Ambulatory Visit: Payer: Self-pay

## 2022-10-27 ENCOUNTER — Inpatient Hospital Stay: Payer: Medicare Other

## 2022-10-27 VITALS — BP 117/64 | HR 66 | Resp 18

## 2022-10-27 DIAGNOSIS — Z5111 Encounter for antineoplastic chemotherapy: Secondary | ICD-10-CM | POA: Diagnosis not present

## 2022-10-27 DIAGNOSIS — Z510 Encounter for antineoplastic radiation therapy: Secondary | ICD-10-CM | POA: Diagnosis not present

## 2022-10-27 DIAGNOSIS — Z87891 Personal history of nicotine dependence: Secondary | ICD-10-CM | POA: Diagnosis not present

## 2022-10-27 DIAGNOSIS — C3490 Malignant neoplasm of unspecified part of unspecified bronchus or lung: Secondary | ICD-10-CM

## 2022-10-27 DIAGNOSIS — C3412 Malignant neoplasm of upper lobe, left bronchus or lung: Secondary | ICD-10-CM | POA: Diagnosis not present

## 2022-10-27 LAB — RAD ONC ARIA SESSION SUMMARY
Course Elapsed Days: 29
Plan Fractions Treated to Date: 20
Plan Prescribed Dose Per Fraction: 2 Gy
Plan Total Fractions Prescribed: 33
Plan Total Prescribed Dose: 66 Gy
Reference Point Dosage Given to Date: 40 Gy
Reference Point Session Dosage Given: 2 Gy
Session Number: 20

## 2022-10-27 MED ORDER — HEPARIN SOD (PORK) LOCK FLUSH 100 UNIT/ML IV SOLN
500.0000 [IU] | Freq: Once | INTRAVENOUS | Status: AC | PRN
  Administered 2022-10-27: 500 [IU]

## 2022-10-27 MED ORDER — SODIUM CHLORIDE 0.9% FLUSH
10.0000 mL | INTRAVENOUS | Status: DC | PRN
  Administered 2022-10-27: 10 mL

## 2022-10-27 MED ORDER — SODIUM CHLORIDE 0.9 % IV SOLN: Freq: Once | INTRAVENOUS | Status: AC

## 2022-10-27 MED ORDER — SODIUM CHLORIDE 0.9 % IV SOLN
100.0000 mg/m2 | Freq: Once | INTRAVENOUS | Status: AC
  Administered 2022-10-27: 150 mg via INTRAVENOUS
  Filled 2022-10-27: qty 7.5

## 2022-10-27 MED ORDER — SODIUM CHLORIDE 0.9 % IV SOLN
10.0000 mg | Freq: Once | INTRAVENOUS | Status: AC
  Administered 2022-10-27: 10 mg via INTRAVENOUS
  Filled 2022-10-27: qty 10

## 2022-10-27 NOTE — Patient Instructions (Signed)
[facility]  Discharge Instructions: Thank you for choosing [facility] to provide your oncology and hematology care.   If you have a lab appointment with the Cancer Center, please go directly to the Cancer Center and check in at the registration area.   Wear comfortable clothing and clothing appropriate for easy access to any Portacath or PICC line.   We strive to give you quality time with your provider. You may need to reschedule your appointment if you arrive late (15 or more minutes).  Arriving late affects you and other patients whose appointments are after yours.  Also, if you miss three or more appointments without notifying the office, you may be dismissed from the clinic at the provider's discretion.      For prescription refill requests, have your pharmacy contact our office and allow 72 hours for refills to be completed.    Today you received the following chemotherapy and/or immunotherapy agents: Etoposide      To help prevent nausea and vomiting after your treatment, we encourage you to take your nausea medication as directed.  BELOW ARE SYMPTOMS THAT SHOULD BE REPORTED IMMEDIATELY: *FEVER GREATER THAN 100.4 F (38 C) OR HIGHER *CHILLS OR SWEATING *NAUSEA AND VOMITING THAT IS NOT CONTROLLED WITH YOUR NAUSEA MEDICATION *UNUSUAL SHORTNESS OF BREATH *UNUSUAL BRUISING OR BLEEDING *URINARY PROBLEMS (pain or burning when urinating, or frequent urination) *BOWEL PROBLEMS (unusual diarrhea, constipation, pain near the anus) TENDERNESS IN MOUTH AND THROAT WITH OR WITHOUT PRESENCE OF ULCERS (sore throat, sores in mouth, or a toothache) UNUSUAL RASH, SWELLING OR PAIN  UNUSUAL VAGINAL DISCHARGE OR ITCHING   Items with * indicate a potential emergency and should be followed up as soon as possible or go to the Emergency Department if any problems should occur.  Please show the CHEMOTHERAPY ALERT CARD or IMMUNOTHERAPY ALERT CARD at check-in to  the Emergency Department and triage nurse.  Should you have questions after your visit or need to cancel or reschedule your appointment, please contact [facility]  Dept: [phone]  and follow the prompts.  Office hours are 8:00 a.m. to 4:30 p.m. Monday - Friday. Please note that voicemails left after 4:00 p.m. may not be returned until the following business day.  We are closed weekends and major holidays. You have access to a nurse at all times for urgent questions. Please call the main number to the clinic Dept: [phone] and follow the prompts.   For any non-urgent questions, you may also contact your provider using MyChart. We now offer e-Visits for anyone 33 and older to request care online for non-urgent symptoms. For details visit [email or web address].   Also download the MyChart app! Go to the app store, search "MyChart", open the app, select [place], and log in with your MyChart username and password.

## 2022-10-28 ENCOUNTER — Ambulatory Visit
Admission: RE | Admit: 2022-10-28 | Discharge: 2022-10-28 | Disposition: A | Discharge status: Home / Self Care | Payer: Medicare Other | Source: Ambulatory Visit | Attending: Radiation Oncology | Admitting: Radiation Oncology

## 2022-10-28 ENCOUNTER — Other Ambulatory Visit: Payer: Self-pay

## 2022-10-28 DIAGNOSIS — Z87891 Personal history of nicotine dependence: Secondary | ICD-10-CM | POA: Diagnosis not present

## 2022-10-28 DIAGNOSIS — C3412 Malignant neoplasm of upper lobe, left bronchus or lung: Secondary | ICD-10-CM | POA: Diagnosis not present

## 2022-10-28 DIAGNOSIS — Z5111 Encounter for antineoplastic chemotherapy: Secondary | ICD-10-CM | POA: Diagnosis not present

## 2022-10-28 DIAGNOSIS — Z510 Encounter for antineoplastic radiation therapy: Secondary | ICD-10-CM | POA: Diagnosis not present

## 2022-10-28 LAB — RAD ONC ARIA SESSION SUMMARY
Course Elapsed Days: 30
Plan Fractions Treated to Date: 21
Plan Prescribed Dose Per Fraction: 2 Gy
Plan Total Fractions Prescribed: 33
Plan Total Prescribed Dose: 66 Gy
Reference Point Dosage Given to Date: 42 Gy
Reference Point Session Dosage Given: 2 Gy
Session Number: 21

## 2022-10-28 NOTE — Progress Notes (Deleted)
[facility]   [phone] Fax:([phone]   Clinic Follow up Note   Patient Care Team: Georgianne Fick, MD as PCP - General (Internal Medicine) Rollene Rotunda, MD as PCP - Cardiology (Cardiology) Claud Kelp, MD as Consulting Physician (General Surgery) Malachy Mood, MD as Consulting Physician (Hematology) Dorothy Puffer, MD as Consulting Physician (Radiation Oncology) Loa Socks, NP as Nurse Practitioner (Hematology and Oncology)  Date of Service:  10/28/2022  CHIEF COMPLAINT: f/u of  small cell lung cancer      CURRENT THERAPY:  concurrent chemo radiation with cisplatin and etoposide every 3 weeks     ASSESSMENT: *** Monica [name] is a 77 y.o. female with   No problem-specific Assessment & Plan notes found for this encounter.  ***   PLAN: {Everything Dr. Mosetta Putt talks to pt about, including reviewing scans and labs. } -{proceed with ***} -{lab with/without flush and f/u when?}   SUMMARY OF ONCOLOGIC HISTORY: Oncology History Overview Note  Cancer Staging Malignant neoplasm of upper-inner quadrant of left breast in female, estrogen receptor positive (HCC) Staging form: Breast, AJCC 8th Edition - Clinical stage from 02/16/2017: Stage IA (cT1b, cN0, cM0, G2, ER: Positive, PR: Positive, HER2: Negative) - Unsigned Staging comments: Staged at breast conference  - Pathologic stage from 03/16/2017: Stage IA (pT1b, pN0, cM0, G2, ER: Positive, PR: Positive, HER2: Negative, Oncotype DX score: 27) - Signed by Malachy Mood, MD on 04/24/2017     Malignant neoplasm of upper-inner quadrant of left breast in female, estrogen receptor positive (HCC)  02/09/2017 Initial Diagnosis   Malignant neoplasm of upper-inner quadrant of left breast in female, estrogen receptor positive (HCC)   02/09/2017 Initial Biopsy   Diagnosis Breast, left, needle core biopsy - INVASIVE DUCTAL CARCINOMA, 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 Derrell Lolling    03/16/2017 Pathology Results   PATHOLOGY REPORT Diagnosis 03/16/17 1. Breast, lumpectomy, Left - INVASIVE DUCTAL CARCINOMA, NOTTINGHAM GRADE 2 OF 3, 0.9 CM - DUCTAL CARCINOMA IN SITU - MARGINS UNINVOLVED BY CARCINOMA (0.3 CM POSTERIOR MARGIN) - PREVIOUS BIOPSY SITE CHANGES - SEE ONCOLOGY TABLE BELOW 2. Lymph node, sentinel, biopsy, Left axillary #1 - NO CARCINOMA IDENTIFIED IN ONE LYMPH NODE (0/1) 3. Lymph node, sentinel, biopsy, Left axillary #2 - NO CARCINOMA IDENTIFIED IN ONE LYMPH NODE (0/1) 4. Lymph node, sentinel, biopsy, Left axillary #3 - NO CARCINOMA IDENTIFIED IN ONE LYMPH NODE (0/1) 5. Lymph node, sentinel, biopsy, Left axillary #4 - NO CARCINOMA IDENTIFIED IN ONE LYMPH NODE (0/1)    03/16/2017 Oncotype testing   Recurrance score of 27 with a 18% distance recurrance in the net 10 years with Tamoxifen alone    05/19/2017 - 06/16/2017 Radiation Therapy   Radiation with Dr. Mitzi Hansen   06/2017 -  Anti-estrogen oral therapy   anastrozole 1 mg once a day starting late 06/2017     04/29/2020 Pathology Results   DIAGNOSIS:   BONE MARROW, ASPIRATE, CLOT, CORE:  -  Cellular marrow with trilineage hematopoiesis  -  No morphologic evidence of carcinoma or hematopoietic neoplasm  -  See microscopic description below   PERIPHERAL BLOOD:  -  Polycythemia and thrombocytopenia  -  See complete blood cell  count   MICROSCOPIC DESCRIPTION:   PERIPHERAL BLOOD SMEAR: The peripheral blood has a polycythemia and  thrombocytopenia.  Leukocytes are morphologically unremarkable.   BONE MARROW ASPIRATE: Spicular, cellular and adequate for evaluation  Erythroid precursors:  Orderly maturation without overt dysplasia  Granulocytic precursors: Orderly maturation without overt dysplasia  Megakaryocytes: Qualitatively and quantitatively unremarkable  Lymphocytes/plasma cells: No lymphocytosis or plasmacytosis    Small cell lung cancer (HCC)  09/14/2022 Cancer Staging   Staging form: Lung, AJCC 8th Edition - Clinical stage from 09/14/2022: Stage IIIB (cT2, cN3, cM0) - Signed by Malachy Mood, MD on 09/16/2022   09/16/2022 Initial Diagnosis   Small cell lung cancer (HCC)   09/29/2022 -  Chemotherapy   Patient is on Treatment Plan : LUNG SMALL CELL Cisplatin (80) D1 + Etoposide (100) D1-3 q21d        INTERVAL HISTORY: *** Monica [name] is here for a follow up of small cell lung cancer    \She was last seen by me on 10/25/2022 She presents to the clinic    All other systems were reviewed with the patient and are negative.  MEDICAL HISTORY:  Past Medical History:  Diagnosis Date   CAD (coronary artery disease)    Cypher stent 09/2002   Cancer (HCC) 2018   left breast, radiation therapy   CHF (congestive heart failure) (HCC)    Depression    Diabetes mellitus (HCC)    Hyperlipidemia    Hypertension    Myocardial infarction (HCC)    2003,went into cardiac shock   Osteoporosis    Seizures (HCC)    1st seizure age late 58's; most recent 07/28/17   Sinusitis     SURGICAL HISTORY: Past Surgical History:  Procedure Laterality Date   ABDOMINAL HYSTERECTOMY     APPENDECTOMY     BREAST LUMPECTOMY WITH RADIOACTIVE SEED AND SENTINEL LYMPH NODE BIOPSY Left 03/16/2017   Procedure: INJECT BLUE DYE LEFT BREAST, LEFT BREAST LUMPECTOMY WITH RADIOACTIVE SEED AND LEFT AXILLARY DEEP SENTINEL LYMPH NODE  BIOPSY ADJACENT TISSUE TRANSFER;  Surgeon: Claud Kelp, MD;  Location: [facility] OR;  Service: General;  Laterality: Left;   CARPAL TUNNEL RELEASE Bilateral    COLONOSCOPY WITH PROPOFOL N/A 05/30/2015   Procedure: COLONOSCOPY WITH PROPOFOL;  Surgeon: Jeani Hawking, MD;   Location: [facility];  Service: Endoscopy;  Laterality: N/A;   COLONOSCOPY WITH PROPOFOL N/A 06/09/2018   Procedure: COLONOSCOPY WITH PROPOFOL;  Surgeon: Jeani Hawking, MD;  Location: [facility];  Service: Endoscopy;  Laterality: N/A;   COLONOSCOPY WITH PROPOFOL N/A 07/31/2021   Procedure: COLONOSCOPY WITH PROPOFOL;  Surgeon: Jeani Hawking, MD;  Location: [facility];  Service: Endoscopy;  Laterality: N/A;   CORONARY ANGIOPLASTY WITH STENT PLACEMENT  10-02-2002   ESOPHAGOGASTRODUODENOSCOPY (EGD) WITH PROPOFOL N/A 07/31/2021   Procedure: ESOPHAGOGASTRODUODENOSCOPY (EGD) WITH PROPOFOL;  Surgeon: Jeani Hawking, MD;  Location: [facility];  Service: Endoscopy;  Laterality: N/A;   Exploratory abdominal     Bleeding in abdomen after tubal   IR IMAGING GUIDED PORT INSERTION  09/28/2022   POLYPECTOMY  06/09/2018   Procedure: POLYPECTOMY;  Surgeon: Jeani Hawking, MD;  Location: [facility];  Service: Endoscopy;;   POLYPECTOMY  07/31/2021   Procedure: POLYPECTOMY;  Surgeon: Jeani Hawking, MD;  Location: [facility];  Service: Endoscopy;;   TUBAL LIGATION     UMBILICAL HERNIA REPAIR      I have reviewed the social history and family history with the patient and they are unchanged from previous note.  ALLERGIES:  is  allergic to ace inhibitors, keflex [cephalexin], phenobarbital, sulfate, altace [ramipril], codeine, and dicyclomine hcl.  MEDICATIONS:  Current Outpatient Medications  Medication Sig Dispense Refill   acetaminophen (TYLENOL) 500 MG tablet Take 1,000 mg by mouth every 6 (six) hours as needed for moderate pain or headache.     albuterol (VENTOLIN HFA) 108 (90 Base) MCG/ACT inhaler INHALE 2 PUFFS BY MOUTH EVERY 6 HOURS AS NEEDED FOR WHEEZING OR  SHORTHNESS  OF  BREATH 9 g 11   anastrozole (ARIMIDEX) 1 MG tablet Take 1 tablet (1 mg total) by mouth daily. 90 tablet 1   aspirin EC 81 MG tablet Take 81 mg by mouth at bedtime.     atorvastatin (LIPITOR) 40 MG tablet Take 40 mg by mouth  at bedtime.     B-D ULTRAFINE III SHORT PEN 31G X 8 MM MISC Inject into the skin as directed.     Calcium Carbonate-Vitamin D (CALTRATE 600+D PO) Take 1 tablet by mouth in the morning and at bedtime.     carvedilol (COREG) 3.125 MG tablet Take 3.125 mg by mouth 2 (two) times daily.      cetirizine (ZYRTEC) 10 MG tablet Take 10 mg by mouth daily as needed for allergies.     clonazePAM (KLONOPIN) 1 MG tablet Take 1 mg by mouth in the morning and at bedtime.     Fluticasone-Umeclidin-Vilant (TRELEGY ELLIPTA) 100-62.5-25 MCG/ACT AEPB INHALE 1 PUFF ONCE DAILY RINSE  MOUTH  AFTER 60 each 11   Insulin Detemir (LEVEMIR) 100 UNIT/ML Pen Inject 17-25 Units into the skin See admin instructions. Inject 25 units subcutaneously in the morning and inject 17 units subcutaneously at night     insulin lispro (HUMALOG) 100 UNIT/ML KwikPen Inject 7-9 Units into the skin See admin instructions. Inject 7 units in the morning, 7 units at lunch, and 9 units at night     ketorolac (ACULAR) 0.5 % ophthalmic solution Place 1 drop into the left eye 4 (four) times daily.     levETIRAcetam (KEPPRA) 500 MG tablet Take 1 tablet (500 mg total) by mouth 2 (two) times daily. 180 tablet 3   lidocaine-prilocaine (EMLA) cream Apply to affected area once 30 g 3   losartan (COZAAR) 50 MG tablet Take 25 mg by mouth at bedtime.     magnesium oxide (MAG-OX) 400 (240 Mg) MG tablet Take 1 tablet (400 mg total) by mouth daily. 60 tablet 1   moxifloxacin (VIGAMOX) 0.5 % ophthalmic solution Place 1 drop into the left eye 4 (four) times daily.     nitroGLYCERIN (NITROSTAT) 0.4 MG SL tablet Place 1 tablet (0.4 mg total) under the tongue every 5 (five) minutes as needed for chest pain. 25 tablet 2   Omega-3 Fatty Acids (FISH OIL) 1000 MG CAPS Take 1,000 mg by mouth in the morning and at bedtime.     omeprazole (PRILOSEC) 20 MG capsule Take 20 mg by mouth in the morning.     ondansetron (ZOFRAN) 8 MG tablet Take 1 tablet (8 mg total) by mouth every  8 (eight) hours as needed for nausea or vomiting. Start on the third day after cisplatin. 30 tablet 1   ONETOUCH VERIO test strip USE THREE TIMES A DAY AND AS NEEDED FOR HYPOGLYCEMIA     OXYGEN Inhale 2 L into the lungs at bedtime.     pioglitazone (ACTOS) 15 MG tablet Take 15 mg by mouth at bedtime.     Polyethyl Glycol-Propyl Glycol 0.4-0.3 % SOLN Place 1 drop into  both eyes 2 (two) times daily as needed (itchy/irritated eyes.).     prednisoLONE acetate (PRED FORTE) 1 % ophthalmic suspension Place 1 drop into the left eye 4 (four) times daily.     prochlorperazine (COMPAZINE) 10 MG tablet Take 1 tablet (10 mg total) by mouth every 6 (six) hours as needed for nausea or vomiting. 30 tablet 1   risedronate (ACTONEL) 150 MG tablet Take 150 mg by mouth every 30 (thirty) days.     sertraline (ZOLOFT) 100 MG tablet Take 100 mg by mouth at bedtime.     sucralfate (CARAFATE) 1 g tablet Take 1 tablet (1 g total) by mouth 4 (four) times daily -  with meals and at bedtime. 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     No current facility-administered medications for this visit.    PHYSICAL EXAMINATION: ECOG PERFORMANCE STATUS: {CHL ONC ECOG PS:[id number]}  There were no vitals filed for this visit. Wt Readings from Last 3 Encounters:  10/25/22 103 lb 4 oz (46.8 kg)  10/20/22 104 lb 8 oz (47.4 kg)  10/15/22 104 lb 12.8 oz (47.5 kg)    {Only keep what was examined. If exam not performed, can use .CEXAM } GENERAL:alert, no distress and comfortable SKIN: skin color, texture, turgor are normal, no rashes or significant lesions EYES: normal, Conjunctiva are pink and non-injected, sclera clear {OROPHARYNX:no exudate, no erythema and lips, buccal mucosa, and tongue normal}  NECK: supple, thyroid normal size, non-tender, without nodularity LYMPH:  no palpable lymphadenopathy in the cervical, axillary  {or inguinal} LUNGS: clear to auscultation and percussion with normal breathing effort HEART: regular rate & rhythm and no murmurs and no lower extremity edema ABDOMEN:abdomen soft, non-tender and normal bowel sounds Musculoskeletal:no cyanosis of digits and no clubbing  NEURO: alert & oriented x 3 with fluent speech, no focal motor/sensory deficits  LABORATORY DATA:  I have reviewed the data as listed    Latest Ref Rng & Units 10/25/2022    8:20 AM 10/19/2022   11:19 AM 10/15/2022    1:53 PM  CBC  WBC 4.0 - 10.5 K/uL 3.6  1.8  0.9   Hemoglobin 12.0 - 15.0 g/dL 12.7  14.3  13.2   Hematocrit 36.0 - 46.0 % 38.5  44.5  40.1   Platelets 150 - 400 K/uL 151  141  69         Latest Ref Rng & Units 10/25/2022    8:20 AM 10/19/2022   11:19 AM 10/15/2022    1:53 PM  CMP  Glucose 70 - 99 mg/dL 140  114  79   BUN 8 - 23 mg/dL 9  16  8    Creatinine 0.44 - 1.00 mg/dL 0.40  0.53  0.42   Sodium 135 - 145 mmol/L 136  138  137   Potassium 3.5 - 5.1 mmol/L 3.9  4.3  4.1   Chloride 98 - 111 mmol/L 103  105  103   CO2 22 - 32 mmol/L 27  26  27    Calcium 8.9 - 10.3 mg/dL 9.3  9.9  9.5   Total Protein 6.5 - 8.1 g/dL 6.6  7.5  6.7   Total Bilirubin 0.3 - 1.2 mg/dL 0.4  0.4  0.5   Alkaline Phos 38 - 126 U/L 45  58  47   AST 15 - 41 U/L 15  16  16  ALT 0 - 44 U/L 11  13  12        RADIOGRAPHIC STUDIES: I have personally reviewed the radiological images as listed and agreed with the findings in the report. No results found.    No orders of the defined types were placed in this encounter.  All questions were answered. The patient knows to call the clinic with any problems, questions or concerns. No barriers to learning was detected. The total time spent in the appointment was {CHL ONC TIME VISIT - [id number]}.     Baldemar Friday, CMA 10/28/2022   I, Audry Riles, CMA, am acting as scribe for Truitt Merle, MD.   {Add scribe attestation statement}

## 2022-10-29 ENCOUNTER — Other Ambulatory Visit: Payer: Self-pay

## 2022-10-29 ENCOUNTER — Encounter: Payer: Self-pay | Admitting: Hematology

## 2022-10-29 ENCOUNTER — Ambulatory Visit
Admission: RE | Admit: 2022-10-29 | Discharge: 2022-10-29 | Disposition: A | Discharge status: Home / Self Care | Payer: Medicare Other | Source: Ambulatory Visit | Attending: Radiation Oncology | Admitting: Radiation Oncology

## 2022-10-29 ENCOUNTER — Inpatient Hospital Stay: Payer: Medicare Other

## 2022-10-29 ENCOUNTER — Inpatient Hospital Stay (HOSPITAL_BASED_OUTPATIENT_CLINIC_OR_DEPARTMENT_OTHER): Payer: Medicare Other | Admitting: Hematology

## 2022-10-29 VITALS — BP 114/71 | HR 72 | Temp 97.9°F | Resp 18 | Ht 65.0 in | Wt 108.2 lb

## 2022-10-29 DIAGNOSIS — E2839 Other primary ovarian failure: Secondary | ICD-10-CM | POA: Diagnosis not present

## 2022-10-29 DIAGNOSIS — Z510 Encounter for antineoplastic radiation therapy: Secondary | ICD-10-CM | POA: Diagnosis not present

## 2022-10-29 DIAGNOSIS — Z5111 Encounter for antineoplastic chemotherapy: Secondary | ICD-10-CM | POA: Diagnosis not present

## 2022-10-29 DIAGNOSIS — Z87891 Personal history of nicotine dependence: Secondary | ICD-10-CM | POA: Diagnosis not present

## 2022-10-29 DIAGNOSIS — C3490 Malignant neoplasm of unspecified part of unspecified bronchus or lung: Secondary | ICD-10-CM

## 2022-10-29 DIAGNOSIS — Z95828 Presence of other vascular implants and grafts: Secondary | ICD-10-CM

## 2022-10-29 DIAGNOSIS — C50212 Malignant neoplasm of upper-inner quadrant of left female breast: Secondary | ICD-10-CM

## 2022-10-29 DIAGNOSIS — C3412 Malignant neoplasm of upper lobe, left bronchus or lung: Secondary | ICD-10-CM | POA: Diagnosis not present

## 2022-10-29 DIAGNOSIS — D751 Secondary polycythemia: Secondary | ICD-10-CM

## 2022-10-29 LAB — CBC WITH DIFFERENTIAL (CANCER CENTER ONLY)
Abs Immature Granulocytes: 0.01 10*3/uL (ref 0.00–0.07)
Basophils Absolute: 0 10*3/uL (ref 0.0–0.1)
Basophils Relative: 0 %
Eosinophils Absolute: 0.1 10*3/uL (ref 0.0–0.5)
Eosinophils Relative: 2 %
HCT: 36.8 % (ref 36.0–46.0)
Hemoglobin: 12.2 g/dL (ref 12.0–15.0)
Immature Granulocytes: 0 %
Lymphocytes Relative: 9 %
Lymphs Abs: 0.3 10*3/uL — ABNORMAL LOW (ref 0.7–4.0)
MCH: 24.8 pg — ABNORMAL LOW (ref 26.0–34.0)
MCHC: 33.2 g/dL (ref 30.0–36.0)
MCV: 74.8 fL — ABNORMAL LOW (ref 80.0–100.0)
Monocytes Absolute: 0.1 10*3/uL (ref 0.1–1.0)
Monocytes Relative: 1 %
Neutro Abs: 3.3 10*3/uL (ref 1.7–7.7)
Neutrophils Relative %: 88 %
Platelet Count: 134 10*3/uL — ABNORMAL LOW (ref 150–400)
RBC: 4.92 MIL/uL (ref 3.87–5.11)
RDW: 19.1 % — ABNORMAL HIGH (ref 11.5–15.5)
WBC Count: 3.7 10*3/uL — ABNORMAL LOW (ref 4.0–10.5)
nRBC: 0 % (ref 0.0–0.2)

## 2022-10-29 LAB — CMP (CANCER CENTER ONLY)
ALT: 17 U/L (ref 0–44)
AST: 14 U/L — ABNORMAL LOW (ref 15–41)
Albumin: 3.8 g/dL (ref 3.5–5.0)
Alkaline Phosphatase: 41 U/L (ref 38–126)
Anion gap: 7 (ref 5–15)
BUN: 15 mg/dL (ref 8–23)
CO2: 28 mmol/L (ref 22–32)
Calcium: 9.4 mg/dL (ref 8.9–10.3)
Chloride: 101 mmol/L (ref 98–111)
Creatinine: 0.68 mg/dL (ref 0.44–1.00)
GFR, Estimated: >60 mL/min (ref >60)
Glucose, Bld: 93 mg/dL (ref 70–99)
Potassium: 3.8 mmol/L (ref 3.5–5.1)
Sodium: 136 mmol/L (ref 135–145)
Total Bilirubin: 0.6 mg/dL (ref 0.3–1.2)
Total Protein: 6.2 g/dL — ABNORMAL LOW (ref 6.5–8.1)

## 2022-10-29 LAB — RAD ONC ARIA SESSION SUMMARY
Course Elapsed Days: 31
Plan Fractions Treated to Date: 22
Plan Prescribed Dose Per Fraction: 2 Gy
Plan Total Fractions Prescribed: 33
Plan Total Prescribed Dose: 66 Gy
Reference Point Dosage Given to Date: 44 Gy
Reference Point Session Dosage Given: 2 Gy
Session Number: 22

## 2022-10-29 LAB — SAMPLE TO BLOOD BANK

## 2022-10-29 LAB — FERRITIN: Ferritin: 70 ng/mL (ref 11–307)

## 2022-10-29 MED ORDER — SODIUM CHLORIDE 0.9% FLUSH
10.0000 mL | INTRAVENOUS | Status: AC | PRN
  Administered 2022-10-29: 10 mL

## 2022-10-29 MED ORDER — ANASTROZOLE 1 MG PO TABS: 1.0000 mg | ORAL_TABLET | Freq: Every day | ORAL | 1 refills | Status: DC

## 2022-10-29 MED ORDER — HEPARIN SOD (PORK) LOCK FLUSH 100 UNIT/ML IV SOLN
500.0000 [IU] | INTRAVENOUS | Status: AC | PRN
  Administered 2022-10-29: 500 [IU]

## 2022-10-29 NOTE — Progress Notes (Addendum)
[place]   [phone] [phone]   Clinic Follow up Note    Patient Care Team: Merrilee Seashore, MD as PCP - General (Internal Medicine) Minus Breeding, MD as PCP - Cardiology (Cardiology) [name], MD as Consulting Physician (General Surgery) Truitt Merle, MD as Consulting Physician (Hematology) Kyung Rudd, MD as Consulting Physician (Radiation Oncology) Gardenia Phlegm, NP as Nurse Practitioner (Hematology and Oncology)   Date of Service:  10/29/2022   CHIEF COMPLAINT: f/u of  small cell lung cancer       CURRENT THERAPY:  concurrent chemo radiation with cisplatin and etoposide every 3 weeks      ASSESSMENT:  Monica [name] is a 77 y.o. female with    Small cell lung cancer ([employer]) -[id number], stage III, limited stage  -found on screening CT chest on July 22, 2022 which showed suprahilar left upper lobe pulmonary mass and mediastinal adenopathy -PET scan from August 08, 2022 showed hypermetabolic left hilar lung mass with mediastinal and left supraclavicular nodal metastasis. 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 Brain MRI negative  -started concurrent chemo cisplatin/etoposide and radiation 09/29/2022 -She tolerated first cycle chemotherapy well. C2 chemo postponed last week due to cytopenia  -she is tolerating treatment overall well, mild odynophagia, she is on sucralfate   PLAN: - refill anastrozole at [facility], [place] to hold during chemoRT  - keep liquid nutrition - Review labs - repeat labs next week - chemo on 2/6     SUMMARY OF ONCOLOGIC HISTORY:     Oncology History Overview Note   Cancer Staging Malignant neoplasm of upper-inner quadrant of left breast in female, estrogen receptor positive ([place]) Staging form: Breast, AJCC 8th Edition - Clinical stage from 02/16/2017: Stage IA (cT1b, cN0, cM0, G2, ER: Positive, PR: Positive, HER2: Negative) -  Unsigned Staging comments: Staged at breast conference  - Pathologic stage from 03/16/2017: Stage IA (pT1b, pN0, cM0, G2, ER: Positive, PR: Positive, HER2: Negative, Oncotype DX score: 27) - Signed by Truitt Merle, MD on 04/24/2017        Malignant neoplasm of upper-inner quadrant of left breast in female, estrogen receptor positive ([facility])   02/09/2017 Initial Diagnosis     Malignant neoplasm of upper-inner quadrant of left breast in female, estrogen receptor positive ([place])     02/09/2017 Initial Biopsy     Diagnosis Breast, left, needle core biopsy - INVASIVE DUCTAL CARCINOMA, 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 Dalbert Batman      03/16/2017 Pathology Results     PATHOLOGY REPORT Diagnosis 03/16/17 1. Breast, lumpectomy, Left - INVASIVE DUCTAL CARCINOMA, NOTTINGHAM GRADE 2 OF 3, 0.9 CM - DUCTAL CARCINOMA IN SITU - MARGINS UNINVOLVED BY CARCINOMA (0.3 CM POSTERIOR MARGIN) - PREVIOUS BIOPSY SITE CHANGES - SEE ONCOLOGY TABLE BELOW 2. Lymph node, sentinel, biopsy, Left axillary #1 - NO CARCINOMA IDENTIFIED IN ONE LYMPH NODE (0/1) 3. Lymph node, sentinel, biopsy, Left axillary #2 - NO CARCINOMA IDENTIFIED IN ONE LYMPH NODE (0/1) 4. Lymph node, sentinel, biopsy, Left axillary #3 - NO CARCINOMA IDENTIFIED IN ONE LYMPH NODE (0/1) 5. Lymph node, sentinel, biopsy, Left  axillary #4 - NO CARCINOMA IDENTIFIED IN ONE LYMPH NODE (0/1)       03/16/2017 Oncotype testing     Recurrance score of 27 with a 18% distance recurrance in the net 10 years with Tamoxifen alone      05/19/2017 - 06/16/2017 Radiation Therapy     Radiation with Dr. Mitzi Hansen     06/2017 -  Anti-estrogen  oral therapy     anastrozole 1 mg once a day starting late 06/2017        04/29/2020 Pathology Results     DIAGNOSIS:   BONE MARROW, ASPIRATE, CLOT, CORE:  -  Cellular marrow with trilineage hematopoiesis  -  No morphologic evidence of carcinoma or hematopoietic neoplasm  -  See microscopic description below   PERIPHERAL BLOOD:  -  Polycythemia and thrombocytopenia  -  See complete blood cell count   MICROSCOPIC DESCRIPTION:   PERIPHERAL BLOOD SMEAR: The peripheral blood has a polycythemia and  thrombocytopenia.  Leukocytes are morphologically unremarkable.   BONE MARROW ASPIRATE: Spicular, cellular and adequate for evaluation  Erythroid precursors: Orderly maturation without overt dysplasia  Granulocytic precursors: Orderly maturation without overt dysplasia  Megakaryocytes: Qualitatively and quantitatively unremarkable  Lymphocytes/plasma cells: No lymphocytosis or plasmacytosis      Small cell lung cancer (HCC)   09/14/2022 Cancer Staging     Staging form: Lung, AJCC 8th Edition - Clinical stage from 09/14/2022: Stage IIIB (cT2, cN3, cM0) - Signed by Malachy Mood, MD on 09/16/2022     09/16/2022 Initial Diagnosis     Small cell lung cancer (HCC)     09/29/2022 -  Chemotherapy     Patient is on Treatment Plan : LUNG SMALL CELL Cisplatin (80) D1 + Etoposide (100) D1-3 q21d            INTERVAL HISTORY:  Monica [name] is here for a follow up of small cell lung cancer    \She was last seen by me on 10/25/2022 She presents to the clinic accompanied by daughter, pt was nauseas and throwing up today took meds and is ok now, pt dealing with radiation well.      All other systems were reviewed with the patient and are negative.   MEDICAL HISTORY:      Past Medical History:  Diagnosis Date   CAD (coronary artery disease)      Cypher stent 09/2002   Cancer (HCC) 2018    left breast, radiation therapy   CHF (congestive heart failure) (HCC)     Depression     Diabetes mellitus  (HCC)     Hyperlipidemia     Hypertension     Myocardial infarction (HCC)      2003,went into cardiac shock   Osteoporosis     Seizures (HCC)      1st seizure age late 53's; most recent 07/28/17   Sinusitis        SURGICAL HISTORY:      Past Surgical History:  Procedure Laterality Date   ABDOMINAL HYSTERECTOMY       APPENDECTOMY       BREAST LUMPECTOMY WITH RADIOACTIVE SEED AND SENTINEL LYMPH NODE BIOPSY Left 03/16/2017    Procedure: INJECT BLUE DYE LEFT BREAST, LEFT BREAST LUMPECTOMY WITH RADIOACTIVE SEED AND LEFT AXILLARY DEEP SENTINEL LYMPH NODE  BIOPSY ADJACENT TISSUE TRANSFER;  Surgeon: Claud Kelp, MD;  Location: [facility] OR;  Service: General;  Laterality: Left;   CARPAL TUNNEL RELEASE Bilateral     COLONOSCOPY WITH PROPOFOL N/A 05/30/2015  Procedure: COLONOSCOPY WITH PROPOFOL;  Surgeon: Carol Ada, MD;  Location: [facility];  Service: Endoscopy;  Laterality: N/A;   COLONOSCOPY WITH PROPOFOL N/A 06/09/2018    Procedure: COLONOSCOPY WITH PROPOFOL;  Surgeon: Carol Ada, MD;  Location: [facility];  Service: Endoscopy;  Laterality: N/A;   COLONOSCOPY WITH PROPOFOL N/A 07/31/2021    Procedure: COLONOSCOPY WITH PROPOFOL;  Surgeon: Carol Ada, MD;  Location: [facility];  Service: Endoscopy;  Laterality: N/A;   CORONARY ANGIOPLASTY WITH STENT PLACEMENT   10-02-2002   ESOPHAGOGASTRODUODENOSCOPY (EGD) WITH PROPOFOL N/A 07/31/2021    Procedure: ESOPHAGOGASTRODUODENOSCOPY (EGD) WITH PROPOFOL;  Surgeon: Carol Ada, MD;  Location: [facility];  Service: Endoscopy;  Laterality: N/A;   Exploratory abdominal        Bleeding in abdomen after tubal   IR IMAGING GUIDED PORT INSERTION   09/28/2022   POLYPECTOMY   06/09/2018    Procedure: POLYPECTOMY;  Surgeon: Carol Ada, MD;  Location: [facility];  Service: Endoscopy;;   POLYPECTOMY   07/31/2021    Procedure: POLYPECTOMY;  Surgeon: Carol Ada, MD;  Location: [facility];  Service: Endoscopy;;   TUBAL LIGATION        UMBILICAL HERNIA REPAIR          I have reviewed the social history and family history with the patient and they are unchanged from previous note.   ALLERGIES:  is allergic to ace inhibitors, keflex [cephalexin], phenobarbital, sulfate, altace [ramipril], codeine, and dicyclomine hcl.   MEDICATIONS:        Current Outpatient Medications  Medication Sig Dispense Refill   acetaminophen (TYLENOL) 500 MG tablet Take 1,000 mg by mouth every 6 (six) hours as needed for moderate pain or headache.       albuterol (VENTOLIN HFA) 108 (90 Base) MCG/ACT inhaler INHALE 2 PUFFS BY MOUTH EVERY 6 HOURS AS NEEDED FOR WHEEZING OR  SHORTHNESS  OF  BREATH 9 g 11   anastrozole (ARIMIDEX) 1 MG tablet Take 1 tablet (1 mg total) by mouth daily. 90 tablet 1   aspirin EC 81 MG tablet Take 81 mg by mouth at bedtime.       atorvastatin (LIPITOR) 40 MG tablet Take 40 mg by mouth at bedtime.       B-D ULTRAFINE III SHORT PEN 31G X 8 MM MISC Inject into the skin as directed.       Calcium Carbonate-Vitamin D (CALTRATE 600+D PO) Take 1 tablet by mouth in the morning and at bedtime.       carvedilol (COREG) 3.125 MG tablet Take 3.125 mg by mouth 2 (two) times daily.        cetirizine (ZYRTEC) 10 MG tablet Take 10 mg by mouth daily as needed for allergies.       clonazePAM (KLONOPIN) 1 MG tablet Take 1 mg by mouth in the morning and at bedtime.       Fluticasone-Umeclidin-Vilant (TRELEGY ELLIPTA) 100-62.5-25 MCG/ACT AEPB INHALE 1 PUFF ONCE DAILY RINSE  MOUTH  AFTER 60 each 11   Insulin Detemir (LEVEMIR) 100 UNIT/ML Pen Inject 17-25 Units into the skin See admin instructions. Inject 25 units subcutaneously in the morning and inject 17 units subcutaneously at night       insulin lispro (HUMALOG) 100 UNIT/ML KwikPen Inject 7-9 Units into the skin See admin instructions. Inject 7 units in the morning, 7 units at lunch, and 9 units at night       ketorolac (ACULAR) 0.5 % ophthalmic solution Place 1 drop into the left eye 4 (  four)  times daily.       levETIRAcetam (KEPPRA) 500 MG tablet Take 1 tablet (500 mg total) by mouth 2 (two) times daily. 180 tablet 3   lidocaine-prilocaine (EMLA) cream Apply to affected area once 30 g 3   losartan (COZAAR) 50 MG tablet Take 25 mg by mouth at bedtime.       magnesium oxide (MAG-OX) 400 (240 Mg) MG tablet Take 1 tablet (400 mg total) by mouth daily. 60 tablet 1   moxifloxacin (VIGAMOX) 0.5 % ophthalmic solution Place 1 drop into the left eye 4 (four) times daily.       nitroGLYCERIN (NITROSTAT) 0.4 MG SL tablet Place 1 tablet (0.4 mg total) under the tongue every 5 (five) minutes as needed for chest pain. 25 tablet 2   Omega-3 Fatty Acids (FISH OIL) 1000 MG CAPS Take 1,000 mg by mouth in the morning and at bedtime.       omeprazole (PRILOSEC) 20 MG capsule Take 20 mg by mouth in the morning.       ondansetron (ZOFRAN) 8 MG tablet Take 1 tablet (8 mg total) by mouth every 8 (eight) hours as needed for nausea or vomiting. Start on the third day after cisplatin. 30 tablet 1   ONETOUCH VERIO test strip USE THREE TIMES A DAY AND AS NEEDED FOR HYPOGLYCEMIA       OXYGEN Inhale 2 L into the lungs at bedtime.       pioglitazone (ACTOS) 15 MG tablet Take 15 mg by mouth at bedtime.       Polyethyl Glycol-Propyl Glycol 0.4-0.3 % SOLN Place 1 drop into both eyes 2 (two) times daily as needed (itchy/irritated eyes.).       prednisoLONE acetate (PRED FORTE) 1 % ophthalmic suspension Place 1 drop into the left eye 4 (four) times daily.       prochlorperazine (COMPAZINE) 10 MG tablet Take 1 tablet (10 mg total) by mouth every 6 (six) hours as needed for nausea or vomiting. 30 tablet 1   risedronate (ACTONEL) 150 MG tablet Take 150 mg by mouth every 30 (thirty) days.       sertraline (ZOLOFT) 100 MG tablet Take 100 mg by mouth at bedtime.       sucralfate (CARAFATE) 1 g tablet Take 1 tablet (1 g total) by mouth 4 (four) times daily -  with meals and at bedtime. 5 min before meals for radiation induced  esophagitis 120 tablet 2   TRIJARDY XR 12.5-2.02-999 MG TB24 Take 1 tablet by mouth 2 (two) times daily.        No current facility-administered medications for this visit.      PHYSICAL EXAMINATION: ECOG PERFORMANCE STATUS: 2 - Symptomatic, <50% confined to bed   There were no vitals filed for this visit.    Wt Readings from Last 3 Encounters:  10/25/22 103 lb 4 oz (46.8 kg)  10/20/22 104 lb 8 oz (47.4 kg)  10/15/22 104 lb 12.8 oz (47.5 kg)     GENERAL:alert, no distress and comfortable SKIN: skin color, texture, turgor are normal, no rashes or significant lesions EYES: normal, Conjunctiva are pink and non-injected, sclera clear NECK: supple, thyroid normal size, non-tender, without nodularity LYMPH:  no palpable lymphadenopathy in the cervical, axillary  LUNGS: clear to auscultation and percussion with normal breathing effort HEART: regular rate & rhythm and no murmurs and no lower extremity edema ABDOMEN:abdomen soft, non-tender and normal bowel sounds Musculoskeletal:no cyanosis of digits and no clubbing  NEURO: alert &  oriented x 3 with fluent speech, no focal motor/sensory deficits   LABORATORY DATA:  I have reviewed the data as listed     Latest Ref Rng & Units 10/25/2022    8:20 AM 10/19/2022   11:19 AM 10/15/2022    1:53 PM  CBC  WBC 4.0 - 10.5 K/uL 3.6  1.8  0.9   Hemoglobin 12.0 - 15.0 g/dL 12.7  14.3  13.2   Hematocrit 36.0 - 46.0 % 38.5  44.5  40.1   Platelets 150 - 400 K/uL 151  141  69             Latest Ref Rng & Units 10/25/2022    8:20 AM 10/19/2022   11:19 AM 10/15/2022    1:53 PM  CMP  Glucose 70 - 99 mg/dL 140  114  79   BUN 8 - 23 mg/dL 9  16  8    Creatinine 0.44 - 1.00 mg/dL 0.40  0.53  0.42   Sodium 135 - 145 mmol/L 136  138  137   Potassium 3.5 - 5.1 mmol/L 3.9  4.3  4.1   Chloride 98 - 111 mmol/L 103  105  103   CO2 22 - 32 mmol/L 27  26  27    Calcium 8.9 - 10.3 mg/dL 9.3  9.9  9.5   Total Protein 6.5 - 8.1 g/dL 6.6  7.5  6.7   Total Bilirubin  0.3 - 1.2 mg/dL 0.4  0.4  0.5   Alkaline Phos 38 - 126 U/L 45  58  47   AST 15 - 41 U/L 15  16  16    ALT 0 - 44 U/L 11  13  12            RADIOGRAPHIC STUDIES: I have personally reviewed the radiological images as listed and agreed with the findings in the report. Imaging Results (Last 48 hours)  No results found.        No orders of the defined types were placed in this encounter.   All questions were answered. The patient knows to call the clinic with any problems, questions or concerns. No barriers to learning was detected. The total time spent in the appointment was 15 minutes.      Truitt Merle, MD 10/29/2022   I, Maurine Simmering, CMA, am acting as scribe for Truitt Merle, MD.    I have reviewed the above documentation for accuracy and completeness, and I agree with the above.

## 2022-10-29 NOTE — Assessment & Plan Note (Signed)
-  [id number], stage III, limited stage  -found on screening CT chest on July 22, 2022 which showed suprahilar left upper lobe pulmonary mass and mediastinal adenopathy -PET scan from August 08, 2022 showed hypermetabolic left hilar lung mass with mediastinal and left supraclavicular nodal metastasis. 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 Brain MRI negative  -started concurrent chemo cisplatin/etoposide and radiation 09/29/2022 -She tolerated first cycle chemotherapy well. C2 chemo postponed last week due to cytopenia

## 2022-11-01 ENCOUNTER — Other Ambulatory Visit: Payer: Self-pay

## 2022-11-01 ENCOUNTER — Ambulatory Visit
Admission: RE | Admit: 2022-11-01 | Discharge: 2022-11-01 | Disposition: A | Discharge status: Home / Self Care | Payer: Medicare Other | Source: Ambulatory Visit | Attending: Radiation Oncology | Admitting: Radiation Oncology

## 2022-11-01 DIAGNOSIS — C3412 Malignant neoplasm of upper lobe, left bronchus or lung: Secondary | ICD-10-CM | POA: Diagnosis not present

## 2022-11-01 DIAGNOSIS — Z510 Encounter for antineoplastic radiation therapy: Secondary | ICD-10-CM | POA: Diagnosis not present

## 2022-11-01 DIAGNOSIS — Z5111 Encounter for antineoplastic chemotherapy: Secondary | ICD-10-CM | POA: Diagnosis not present

## 2022-11-01 DIAGNOSIS — Z87891 Personal history of nicotine dependence: Secondary | ICD-10-CM | POA: Diagnosis not present

## 2022-11-01 LAB — RAD ONC ARIA SESSION SUMMARY
Course Elapsed Days: 34
Plan Fractions Treated to Date: 23
Plan Prescribed Dose Per Fraction: 2 Gy
Plan Total Fractions Prescribed: 33
Plan Total Prescribed Dose: 66 Gy
Reference Point Dosage Given to Date: 46 Gy
Reference Point Session Dosage Given: 2 Gy
Session Number: 23

## 2022-11-02 ENCOUNTER — Other Ambulatory Visit: Payer: Self-pay

## 2022-11-02 ENCOUNTER — Ambulatory Visit
Admission: RE | Admit: 2022-11-02 | Discharge: 2022-11-02 | Disposition: A | Discharge status: Home / Self Care | Payer: Medicare Other | Source: Ambulatory Visit | Attending: Radiation Oncology | Admitting: Radiation Oncology

## 2022-11-02 DIAGNOSIS — Z5111 Encounter for antineoplastic chemotherapy: Secondary | ICD-10-CM | POA: Diagnosis not present

## 2022-11-02 DIAGNOSIS — Z87891 Personal history of nicotine dependence: Secondary | ICD-10-CM | POA: Diagnosis not present

## 2022-11-02 DIAGNOSIS — C3412 Malignant neoplasm of upper lobe, left bronchus or lung: Secondary | ICD-10-CM | POA: Diagnosis not present

## 2022-11-02 DIAGNOSIS — Z510 Encounter for antineoplastic radiation therapy: Secondary | ICD-10-CM | POA: Diagnosis not present

## 2022-11-02 LAB — RAD ONC ARIA SESSION SUMMARY
Course Elapsed Days: 35
Plan Fractions Treated to Date: 24
Plan Prescribed Dose Per Fraction: 2 Gy
Plan Total Fractions Prescribed: 33
Plan Total Prescribed Dose: 66 Gy
Reference Point Dosage Given to Date: 48 Gy
Reference Point Session Dosage Given: 2 Gy
Session Number: 24

## 2022-11-03 ENCOUNTER — Other Ambulatory Visit: Payer: Self-pay

## 2022-11-03 ENCOUNTER — Ambulatory Visit
Admission: RE | Admit: 2022-11-03 | Discharge: 2022-11-03 | Disposition: A | Discharge status: Home / Self Care | Payer: Medicare Other | Source: Ambulatory Visit | Attending: Radiation Oncology | Admitting: Radiation Oncology

## 2022-11-03 DIAGNOSIS — Z510 Encounter for antineoplastic radiation therapy: Secondary | ICD-10-CM | POA: Diagnosis not present

## 2022-11-03 DIAGNOSIS — C3412 Malignant neoplasm of upper lobe, left bronchus or lung: Secondary | ICD-10-CM | POA: Diagnosis not present

## 2022-11-03 DIAGNOSIS — Z87891 Personal history of nicotine dependence: Secondary | ICD-10-CM | POA: Diagnosis not present

## 2022-11-03 DIAGNOSIS — Z5111 Encounter for antineoplastic chemotherapy: Secondary | ICD-10-CM | POA: Diagnosis not present

## 2022-11-03 LAB — RAD ONC ARIA SESSION SUMMARY
Course Elapsed Days: 36
Plan Fractions Treated to Date: 25
Plan Prescribed Dose Per Fraction: 2 Gy
Plan Total Fractions Prescribed: 33
Plan Total Prescribed Dose: 66 Gy
Reference Point Dosage Given to Date: 50 Gy
Reference Point Session Dosage Given: 2 Gy
Session Number: 25

## 2022-11-04 ENCOUNTER — Ambulatory Visit
Admission: RE | Admit: 2022-11-04 | Discharge: 2022-11-04 | Disposition: A | Discharge status: Home / Self Care | Payer: Medicare Other | Source: Ambulatory Visit | Attending: Radiation Oncology | Admitting: Radiation Oncology

## 2022-11-04 ENCOUNTER — Other Ambulatory Visit: Payer: Self-pay

## 2022-11-04 DIAGNOSIS — Z5111 Encounter for antineoplastic chemotherapy: Secondary | ICD-10-CM | POA: Diagnosis not present

## 2022-11-04 DIAGNOSIS — Z510 Encounter for antineoplastic radiation therapy: Secondary | ICD-10-CM | POA: Diagnosis not present

## 2022-11-04 DIAGNOSIS — C3412 Malignant neoplasm of upper lobe, left bronchus or lung: Secondary | ICD-10-CM | POA: Diagnosis not present

## 2022-11-04 DIAGNOSIS — Z87891 Personal history of nicotine dependence: Secondary | ICD-10-CM | POA: Diagnosis not present

## 2022-11-04 LAB — RAD ONC ARIA SESSION SUMMARY
Course Elapsed Days: 37
Plan Fractions Treated to Date: 26
Plan Prescribed Dose Per Fraction: 2 Gy
Plan Total Fractions Prescribed: 33
Plan Total Prescribed Dose: 66 Gy
Reference Point Dosage Given to Date: 52 Gy
Reference Point Session Dosage Given: 2 Gy
Session Number: 26

## 2022-11-05 ENCOUNTER — Other Ambulatory Visit: Payer: Self-pay

## 2022-11-05 ENCOUNTER — Inpatient Hospital Stay (HOSPITAL_BASED_OUTPATIENT_CLINIC_OR_DEPARTMENT_OTHER): Payer: Medicare Other | Admitting: Hematology

## 2022-11-05 ENCOUNTER — Encounter: Payer: Self-pay | Admitting: Hematology

## 2022-11-05 ENCOUNTER — Ambulatory Visit
Admission: RE | Admit: 2022-11-05 | Discharge: 2022-11-05 | Disposition: A | Discharge status: Home / Self Care | Payer: Medicare Other | Source: Ambulatory Visit | Attending: Radiation Oncology | Admitting: Radiation Oncology

## 2022-11-05 ENCOUNTER — Inpatient Hospital Stay: Payer: Medicare Other

## 2022-11-05 ENCOUNTER — Ambulatory Visit: Admission: RE | Admit: 2022-11-05 | Payer: Medicare Other | Source: Ambulatory Visit

## 2022-11-05 VITALS — BP 130/68 | HR 72 | Temp 98.2°F | Resp 16 | Wt 105.2 lb

## 2022-11-05 DIAGNOSIS — C3412 Malignant neoplasm of upper lobe, left bronchus or lung: Secondary | ICD-10-CM | POA: Diagnosis not present

## 2022-11-05 DIAGNOSIS — Z510 Encounter for antineoplastic radiation therapy: Secondary | ICD-10-CM | POA: Diagnosis not present

## 2022-11-05 DIAGNOSIS — Z170 Estrogen receptor positive status [ER+]: Secondary | ICD-10-CM

## 2022-11-05 DIAGNOSIS — Z95828 Presence of other vascular implants and grafts: Secondary | ICD-10-CM

## 2022-11-05 DIAGNOSIS — C3490 Malignant neoplasm of unspecified part of unspecified bronchus or lung: Secondary | ICD-10-CM

## 2022-11-05 DIAGNOSIS — Z5111 Encounter for antineoplastic chemotherapy: Secondary | ICD-10-CM | POA: Diagnosis not present

## 2022-11-05 DIAGNOSIS — D751 Secondary polycythemia: Secondary | ICD-10-CM

## 2022-11-05 DIAGNOSIS — E611 Iron deficiency: Secondary | ICD-10-CM

## 2022-11-05 DIAGNOSIS — Z87891 Personal history of nicotine dependence: Secondary | ICD-10-CM | POA: Diagnosis not present

## 2022-11-05 LAB — CBC WITH DIFFERENTIAL (CANCER CENTER ONLY)
Abs Immature Granulocytes: 0.01 10*3/uL (ref 0.00–0.07)
Basophils Absolute: 0 10*3/uL (ref 0.0–0.1)
Basophils Relative: 0 %
Eosinophils Absolute: 0.1 10*3/uL (ref 0.0–0.5)
Eosinophils Relative: 5 %
HCT: 34 % — ABNORMAL LOW (ref 36.0–46.0)
Hemoglobin: 11.5 g/dL — ABNORMAL LOW (ref 12.0–15.0)
Immature Granulocytes: 0 %
Lymphocytes Relative: 12 %
Lymphs Abs: 0.3 10*3/uL — ABNORMAL LOW (ref 0.7–4.0)
MCH: 25.2 pg — ABNORMAL LOW (ref 26.0–34.0)
MCHC: 33.8 g/dL (ref 30.0–36.0)
MCV: 74.4 fL — ABNORMAL LOW (ref 80.0–100.0)
Monocytes Absolute: 0.1 10*3/uL (ref 0.1–1.0)
Monocytes Relative: 4 %
Neutro Abs: 2.3 10*3/uL (ref 1.7–7.7)
Neutrophils Relative %: 79 %
Platelet Count: 37 10*3/uL — ABNORMAL LOW (ref 150–400)
RBC: 4.57 MIL/uL (ref 3.87–5.11)
RDW: 18.4 % — ABNORMAL HIGH (ref 11.5–15.5)
WBC Count: 2.9 10*3/uL — ABNORMAL LOW (ref 4.0–10.5)
nRBC: 0 % (ref 0.0–0.2)

## 2022-11-05 LAB — CMP (CANCER CENTER ONLY)
ALT: 10 U/L (ref 0–44)
AST: 14 U/L — ABNORMAL LOW (ref 15–41)
Albumin: 4 g/dL (ref 3.5–5.0)
Alkaline Phosphatase: 43 U/L (ref 38–126)
Anion gap: 5 (ref 5–15)
BUN: 9 mg/dL (ref 8–23)
CO2: 28 mmol/L (ref 22–32)
Calcium: 9.3 mg/dL (ref 8.9–10.3)
Chloride: 103 mmol/L (ref 98–111)
Creatinine: 0.36 mg/dL — ABNORMAL LOW (ref 0.44–1.00)
GFR, Estimated: >60 mL/min (ref >60)
Glucose, Bld: 89 mg/dL (ref 70–99)
Potassium: 3.9 mmol/L (ref 3.5–5.1)
Sodium: 136 mmol/L (ref 135–145)
Total Bilirubin: 0.5 mg/dL (ref 0.3–1.2)
Total Protein: 6.9 g/dL (ref 6.5–8.1)

## 2022-11-05 LAB — RAD ONC ARIA SESSION SUMMARY
Course Elapsed Days: 38
Plan Fractions Treated to Date: 27
Plan Prescribed Dose Per Fraction: 2 Gy
Plan Total Fractions Prescribed: 33
Plan Total Prescribed Dose: 66 Gy
Reference Point Dosage Given to Date: 54 Gy
Reference Point Session Dosage Given: 2 Gy
Session Number: 27

## 2022-11-05 LAB — SAMPLE TO BLOOD BANK

## 2022-11-05 LAB — FERRITIN: Ferritin: 44 ng/mL (ref 11–307)

## 2022-11-05 MED ORDER — SODIUM CHLORIDE 0.9% FLUSH
10.0000 mL | Freq: Once | INTRAVENOUS | Status: AC | PRN
  Administered 2022-11-05: 10 mL

## 2022-11-05 MED ORDER — HEPARIN SOD (PORK) LOCK FLUSH 100 UNIT/ML IV SOLN
500.0000 [IU] | Freq: Once | INTRAVENOUS | Status: AC
  Administered 2022-11-05: 500 [IU] via INTRAVENOUS

## 2022-11-05 MED ORDER — SONAFINE EX EMUL
1.0000 | Freq: Two times a day (BID) | CUTANEOUS | Status: DC
  Administered 2022-11-05: 1 via TOPICAL

## 2022-11-05 MED ORDER — HEPARIN SOD (PORK) LOCK FLUSH 100 UNIT/ML IV SOLN: 250.0000 [IU] | Freq: Once | INTRAVENOUS | Status: DC | PRN

## 2022-11-05 NOTE — Progress Notes (Signed)
[place]   [phone] [phone]   Clinic Follow up Note   Patient Care Team: Merrilee Seashore, MD as PCP - General (Internal Medicine) Minus Breeding, MD as PCP - Cardiology (Cardiology) [name], MD as Consulting Physician (General Surgery) Truitt Merle, MD as Consulting Physician (Hematology) Kyung Rudd, MD as Consulting Physician (Radiation Oncology) Gardenia Phlegm, NP as Nurse Practitioner (Hematology and Oncology)  Date of Service:  11/05/2022  CHIEF COMPLAINT: f/u of small cell lung cancer     CURRENT THERAPY:   concurrent chemo radiation with cisplatin and etoposide every 3 weeks    ASSESSMENT:  Monica [name] is a 77 y.o. female with   Small cell lung cancer ([place]) -[id number], stage III, limited stage  -found on screening CT chest on July 22, 2022 which showed suprahilar left upper lobe pulmonary mass and mediastinal adenopathy -PET scan from August 08, 2022 showed hypermetabolic left hilar lung mass with mediastinal and left supraclavicular nodal metastasis. 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 Brain MRI negative  -started concurrent chemo cisplatin/etoposide and radiation 09/29/2022 -She tolerated first cycle chemotherapy well. C2 chemo postponed last week due to cytopenia  -she is tolerating treatment overall well, mild odynophagia, she is on sucralfate   PLAN: - Lab reviewed,  - repeat CBC on Monday 1/29 to see if she needs plt transfusion  -flab,f/u and chemo on 2/5    SUMMARY OF ONCOLOGIC HISTORY: Oncology History Overview Note  Cancer Staging Malignant neoplasm of upper-inner quadrant of left breast in female, estrogen receptor positive ([place]) Staging form: Breast, AJCC 8th Edition - Clinical stage from 02/16/2017: Stage IA (cT1b, cN0, cM0, G2, ER: Positive, PR: Positive, HER2: Negative) - Unsigned Staging comments: Staged at breast  conference  - Pathologic stage from 03/16/2017: Stage IA (pT1b, pN0, cM0, G2, ER: Positive, PR: Positive, HER2: Negative, Oncotype DX score: 27) - Signed by Truitt Merle, MD on 04/24/2017     Malignant neoplasm of upper-inner quadrant of left breast in female, estrogen receptor positive ([place])  02/09/2017 Initial Diagnosis   Malignant neoplasm of upper-inner quadrant of left breast in female, estrogen receptor positive ([place])   02/09/2017 Initial Biopsy   Diagnosis Breast, left, needle core biopsy - INVASIVE DUCTAL CARCINOMA, 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 Dalbert Batman    03/16/2017 Pathology Results   PATHOLOGY REPORT Diagnosis 03/16/17 1. Breast, lumpectomy, Left - INVASIVE DUCTAL CARCINOMA, NOTTINGHAM GRADE 2 OF 3, 0.9 CM - DUCTAL CARCINOMA IN SITU - MARGINS UNINVOLVED BY CARCINOMA (0.3 CM POSTERIOR MARGIN) - PREVIOUS BIOPSY SITE CHANGES - SEE ONCOLOGY TABLE BELOW 2. Lymph node, sentinel, biopsy, Left axillary #1 - NO CARCINOMA IDENTIFIED IN ONE LYMPH NODE (0/1) 3. Lymph node, sentinel, biopsy, Left axillary #2 - NO CARCINOMA IDENTIFIED IN ONE LYMPH NODE (0/1) 4. Lymph node, sentinel, biopsy, Left axillary #3 - NO CARCINOMA IDENTIFIED IN ONE LYMPH NODE (0/1) 5. Lymph node, sentinel, biopsy, Left axillary #4 - NO CARCINOMA IDENTIFIED IN ONE LYMPH NODE (0/1)    03/16/2017 Oncotype testing   Recurrance score of 27 with a 18% distance recurrance in the net 10 years with Tamoxifen alone    05/19/2017 -  06/16/2017 Radiation Therapy   Radiation with Dr. Lisbeth Renshaw   06/2017 -  Anti-estrogen oral therapy   anastrozole 1 mg once a day starting late 06/2017      04/29/2020 Pathology Results   DIAGNOSIS:   BONE MARROW, ASPIRATE, CLOT, CORE:  -  Cellular marrow with trilineage hematopoiesis  -  No morphologic evidence of carcinoma or hematopoietic neoplasm  -  See microscopic description below   PERIPHERAL BLOOD:  -  Polycythemia and thrombocytopenia  -  See complete blood cell count   MICROSCOPIC DESCRIPTION:   PERIPHERAL BLOOD SMEAR: The peripheral blood has a polycythemia and  thrombocytopenia.  Leukocytes are morphologically unremarkable.   BONE MARROW ASPIRATE: Spicular, cellular and adequate for evaluation  Erythroid precursors: Orderly maturation without overt dysplasia  Granulocytic precursors: Orderly maturation without overt dysplasia  Megakaryocytes: Qualitatively and quantitatively unremarkable  Lymphocytes/plasma cells: No lymphocytosis or plasmacytosis    Small cell lung cancer ([place])  09/14/2022 Cancer Staging   Staging form: Lung, AJCC 8th Edition - Clinical stage from 09/14/2022: Stage IIIB (cT2, cN3, cM0) - Signed by Truitt Merle, MD on 09/16/2022   09/16/2022 Initial Diagnosis   Small cell lung cancer ([place])   09/29/2022 -  Chemotherapy   Patient is on Treatment Plan : LUNG SMALL CELL Cisplatin (80) D1 + Etoposide (100) D1-3 q21d        INTERVAL HISTORY:  Monica [name] is here for a follow up of small cell lung cancer     She was last seen by me on 10/29/2022 She presents to the clinic alone. Pt states radiation is going good. Pt reports of her swallowing is good. Pt said she has some fatigue, but is still able to care of her ADL'S.    All other systems were reviewed with the patient and are negative.  MEDICAL HISTORY:  Past Medical History:  Diagnosis Date   CAD (coronary artery disease)    Cypher stent 09/2002   Cancer ([place]) 2018   left breast, radiation therapy   CHF (congestive heart failure) ([place])    Depression    Diabetes mellitus ([place])    Hyperlipidemia    Hypertension    Myocardial infarction ([place])     2003,went into cardiac shock   Osteoporosis    Seizures ([place])    1st seizure age late 84's; most recent 07/28/17   Sinusitis     SURGICAL HISTORY: Past Surgical History:  Procedure Laterality Date   ABDOMINAL HYSTERECTOMY     APPENDECTOMY     BREAST LUMPECTOMY WITH RADIOACTIVE SEED AND SENTINEL LYMPH NODE BIOPSY Left 03/16/2017   Procedure: INJECT BLUE DYE LEFT BREAST, LEFT BREAST LUMPECTOMY WITH RADIOACTIVE SEED AND LEFT AXILLARY DEEP SENTINEL LYMPH NODE  BIOPSY ADJACENT TISSUE TRANSFER;  Surgeon: [name], MD;  Location: [facility];  Service: General;  Laterality: Left;   CARPAL TUNNEL RELEASE Bilateral    COLONOSCOPY WITH PROPOFOL N/A 05/30/2015   Procedure: COLONOSCOPY WITH PROPOFOL;  Surgeon: Carol Ada, MD;  Location: [facility];  Service: Endoscopy;  Laterality: N/A;   COLONOSCOPY WITH PROPOFOL N/A 06/09/2018   Procedure: COLONOSCOPY WITH PROPOFOL;  Surgeon: Carol Ada, MD;  Location: [facility];  Service: Endoscopy;  Laterality: N/A;   COLONOSCOPY WITH PROPOFOL N/A 07/31/2021   Procedure: COLONOSCOPY WITH PROPOFOL;  Surgeon: Carol Ada, MD;  Location: [facility];  Service: Endoscopy;  Laterality: N/A;   CORONARY ANGIOPLASTY WITH STENT PLACEMENT  10-02-2002   ESOPHAGOGASTRODUODENOSCOPY (EGD) WITH PROPOFOL N/A 07/31/2021   Procedure: ESOPHAGOGASTRODUODENOSCOPY (EGD) WITH PROPOFOL;  Surgeon: Carol Ada, MD;  Location: [facility];  Service: Endoscopy;  Laterality: N/A;   Exploratory abdominal     Bleeding in abdomen after tubal   IR IMAGING GUIDED PORT INSERTION  09/28/2022   POLYPECTOMY  06/09/2018   Procedure: POLYPECTOMY;  Surgeon: Carol Ada, MD;  Location: [facility];  Service: Endoscopy;;   POLYPECTOMY  07/31/2021   Procedure: POLYPECTOMY;  Surgeon: Carol Ada, MD;  Location: [facility];  Service: Endoscopy;;   TUBAL LIGATION     UMBILICAL HERNIA REPAIR      I have reviewed the social history and family history with the patient and they are  unchanged from previous note.  ALLERGIES:  is allergic to ace inhibitors, keflex [cephalexin], phenobarbital, sulfate, altace [ramipril], codeine, and dicyclomine hcl.  MEDICATIONS:  Current Outpatient Medications  Medication Sig Dispense Refill   acetaminophen (TYLENOL) 500 MG tablet Take 1,000 mg by mouth every 6 (six) hours as needed for moderate pain or headache.     albuterol (VENTOLIN HFA) 108 (90 Base) MCG/ACT inhaler INHALE 2 PUFFS BY MOUTH EVERY 6 HOURS AS NEEDED FOR WHEEZING OR  SHORTHNESS  OF  BREATH 9 g 11   anastrozole (ARIMIDEX) 1 MG tablet Take 1 tablet (1 mg total) by mouth daily. 90 tablet 1   aspirin EC 81 MG tablet Take 81 mg by mouth at bedtime.     atorvastatin (LIPITOR) 40 MG tablet Take 40 mg by mouth at bedtime.     B-D ULTRAFINE III SHORT PEN 31G X 8 MM MISC Inject into the skin as directed.     Calcium Carbonate-Vitamin D (CALTRATE 600+D PO) Take 1 tablet by mouth in the morning and at bedtime.     carvedilol (COREG) 3.125 MG tablet Take 3.125 mg by mouth 2 (two) times daily.      cetirizine (ZYRTEC) 10 MG tablet Take 10 mg by mouth daily as needed for allergies.     clonazePAM (KLONOPIN) 1 MG tablet Take 1 mg by mouth in the morning and at bedtime.     Fluticasone-Umeclidin-Vilant (TRELEGY ELLIPTA) 100-62.5-25 MCG/ACT AEPB INHALE 1 PUFF ONCE DAILY RINSE  MOUTH  AFTER 60 each 11   Insulin Detemir (LEVEMIR) 100 UNIT/ML Pen Inject 17-25 Units into the skin See admin instructions. Inject 25 units subcutaneously in the morning and inject 17 units subcutaneously at night     insulin lispro (HUMALOG) 100 UNIT/ML KwikPen Inject 7-9 Units into the skin See admin instructions. Inject 7 units in the morning, 7 units at lunch, and 9 units at night     ketorolac (ACULAR) 0.5 % ophthalmic solution Place 1 drop into the left eye 4 (four) times daily.     levETIRAcetam (KEPPRA) 500 MG tablet Take 1 tablet (500 mg total) by mouth 2 (two) times daily. 180 tablet 3    lidocaine-prilocaine (EMLA) cream Apply to affected area once 30 g 3   losartan (COZAAR) 50 MG tablet Take 25 mg by mouth at bedtime.     magnesium oxide (MAG-OX) 400 (240 Mg) MG tablet Take 1 tablet (400 mg total) by mouth daily. 60 tablet 1   moxifloxacin (VIGAMOX) 0.5 % ophthalmic solution Place 1 drop into the left eye 4 (four) times daily.     nitroGLYCERIN (NITROSTAT) 0.4 MG SL tablet Place 1 tablet (0.4 mg total) under the tongue every 5 (five) minutes as needed for chest pain. 25 tablet 2   Omega-3 Fatty Acids (FISH OIL) 1000 MG CAPS Take 1,000 mg by mouth in the  morning and at bedtime.     omeprazole (PRILOSEC) 20 MG capsule Take 20 mg by mouth in the morning.     ondansetron (ZOFRAN) 8 MG tablet Take 1 tablet (8 mg total) by mouth every 8 (eight) hours as needed for nausea or vomiting. Start on the third day after cisplatin. 30 tablet 1   ONETOUCH VERIO test strip USE THREE TIMES A DAY AND AS NEEDED FOR HYPOGLYCEMIA     OXYGEN Inhale 2 L into the lungs at bedtime.     pioglitazone (ACTOS) 15 MG tablet Take 15 mg by mouth at bedtime.     Polyethyl Glycol-Propyl Glycol 0.4-0.3 % SOLN Place 1 drop into both eyes 2 (two) times daily as needed (itchy/irritated eyes.).     prednisoLONE acetate (PRED FORTE) 1 % ophthalmic suspension Place 1 drop into the left eye 4 (four) times daily.     prochlorperazine (COMPAZINE) 10 MG tablet Take 1 tablet (10 mg total) by mouth every 6 (six) hours as needed for nausea or vomiting. 30 tablet 1   risedronate (ACTONEL) 150 MG tablet Take 150 mg by mouth every 30 (thirty) days.     sertraline (ZOLOFT) 100 MG tablet Take 100 mg by mouth at bedtime.     sucralfate (CARAFATE) 1 g tablet Take 1 tablet (1 g total) by mouth 4 (four) times daily -  with meals and at bedtime. 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     No current facility-administered medications  for this visit.   Facility-Administered Medications Ordered in Other Visits  Medication Dose Route Frequency Provider Last Rate Last Admin   Sonafine emulsion 1 Application  1 Application Topical BID Tyler Pita, MD   1 Application at 54/65/68 1226    PHYSICAL EXAMINATION: ECOG PERFORMANCE STATUS: 2 - Symptomatic, <50% confined to bed  Vitals:   11/05/22 1309  BP: 130/68  Pulse: 72  Resp: 16  Temp: 98.2 F (36.8 C)  SpO2: 97%   Wt Readings from Last 3 Encounters:  11/05/22 105 lb 3.2 oz (47.7 kg)  10/29/22 108 lb 3.2 oz (49.1 kg)  10/25/22 103 lb 4 oz (46.8 kg)     GENERAL:alert, no distress and comfortable SKIN: skin color normal, no rashes or significant lesions EYES: normal, Conjunctiva are pink and non-injected, sclera clear  NEURO: alert & oriented x 3 with fluent speech LABORATORY DATA:  I have reviewed the data as listed    Latest Ref Rng & Units 11/05/2022   12:44 PM 10/29/2022    1:06 PM 10/25/2022    8:20 AM  CBC  WBC 4.0 - 10.5 K/uL 2.9  3.7  3.6   Hemoglobin 12.0 - 15.0 g/dL 11.5  12.2  12.7   Hematocrit 36.0 - 46.0 % 34.0  36.8  38.5   Platelets 150 - 400 K/uL 37  134  151         Latest Ref Rng & Units 11/05/2022   12:44 PM 10/29/2022    1:06 PM 10/25/2022    8:20 AM  CMP  Glucose 70 - 99 mg/dL 89  93  140   BUN 8 - 23 mg/dL 9  15  9    Creatinine 0.44 - 1.00 mg/dL 0.36  0.68  0.40   Sodium 135 - 145 mmol/L 136  136  136   Potassium 3.5 - 5.1 mmol/L 3.9  3.8  3.9   Chloride 98 - 111 mmol/L 103  101  103   CO2 22 - 32 mmol/L 28  28  27    Calcium 8.9 - 10.3 mg/dL 9.3  9.4  9.3   Total Protein 6.5 - 8.1 g/dL 6.9  6.2  6.6   Total Bilirubin 0.3 - 1.2 mg/dL 0.5  0.6  0.4   Alkaline Phos 38 - 126 U/L 43  41  45   AST 15 - 41 U/L 14  14  15    ALT 0 - 44 U/L 10  17  11        RADIOGRAPHIC STUDIES: I have personally reviewed the radiological images as listed and agreed with the findings in the report. No results found.    No orders of the  defined types were placed in this encounter.  All questions were answered. The patient knows to call the clinic with any problems, questions or concerns. No barriers to learning was detected. The total time spent in the appointment was 15 minutes.     Truitt Merle, MD 11/05/2022   Felicity Coyer, CMA, am acting as scribe for Truitt Merle, MD.   I have reviewed the above documentation for accuracy and completeness, and I agree with the above.

## 2022-11-05 NOTE — Patient Instructions (Signed)

## 2022-11-05 NOTE — Assessment & Plan Note (Signed)
-  [id number], stage III, limited stage  -found on screening CT chest on July 22, 2022 which showed suprahilar left upper lobe pulmonary mass and mediastinal adenopathy -PET scan from August 08, 2022 showed hypermetabolic left hilar lung mass with mediastinal and left supraclavicular nodal metastasis. 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 Brain MRI negative  -started concurrent chemo cisplatin/etoposide and radiation 09/29/2022 -She tolerated first cycle chemotherapy well. C2 chemo postponed last week due to cytopenia  -she is tolerating treatment overall well, mild odynophagia, she is on sucralfate

## 2022-11-08 ENCOUNTER — Other Ambulatory Visit: Payer: Self-pay

## 2022-11-08 ENCOUNTER — Ambulatory Visit
Admission: RE | Admit: 2022-11-08 | Discharge: 2022-11-08 | Disposition: A | Discharge status: Home / Self Care | Payer: Medicare Other | Source: Ambulatory Visit | Attending: Radiation Oncology | Admitting: Radiation Oncology

## 2022-11-08 ENCOUNTER — Inpatient Hospital Stay: Payer: Medicare Other

## 2022-11-08 ENCOUNTER — Encounter: Payer: Self-pay | Admitting: Nurse Practitioner

## 2022-11-08 DIAGNOSIS — Z510 Encounter for antineoplastic radiation therapy: Secondary | ICD-10-CM | POA: Diagnosis not present

## 2022-11-08 DIAGNOSIS — C3412 Malignant neoplasm of upper lobe, left bronchus or lung: Secondary | ICD-10-CM | POA: Diagnosis not present

## 2022-11-08 DIAGNOSIS — D751 Secondary polycythemia: Secondary | ICD-10-CM

## 2022-11-08 DIAGNOSIS — Z95828 Presence of other vascular implants and grafts: Secondary | ICD-10-CM | POA: Insufficient documentation

## 2022-11-08 DIAGNOSIS — C3490 Malignant neoplasm of unspecified part of unspecified bronchus or lung: Secondary | ICD-10-CM

## 2022-11-08 DIAGNOSIS — Z5111 Encounter for antineoplastic chemotherapy: Secondary | ICD-10-CM | POA: Diagnosis not present

## 2022-11-08 DIAGNOSIS — Z87891 Personal history of nicotine dependence: Secondary | ICD-10-CM | POA: Diagnosis not present

## 2022-11-08 DIAGNOSIS — C50212 Malignant neoplasm of upper-inner quadrant of left female breast: Secondary | ICD-10-CM

## 2022-11-08 LAB — CBC WITH DIFFERENTIAL (CANCER CENTER ONLY)
Abs Immature Granulocytes: 0 10*3/uL (ref 0.00–0.07)
Basophils Absolute: 0 10*3/uL (ref 0.0–0.1)
Basophils Relative: 0 %
Eosinophils Absolute: 0.1 10*3/uL (ref 0.0–0.5)
Eosinophils Relative: 8 %
HCT: 36.4 % (ref 36.0–46.0)
Hemoglobin: 12.3 g/dL (ref 12.0–15.0)
Immature Granulocytes: 0 %
Lymphocytes Relative: 14 %
Lymphs Abs: 0.3 10*3/uL — ABNORMAL LOW (ref 0.7–4.0)
MCH: 25.2 pg — ABNORMAL LOW (ref 26.0–34.0)
MCHC: 33.8 g/dL (ref 30.0–36.0)
MCV: 74.6 fL — ABNORMAL LOW (ref 80.0–100.0)
Monocytes Absolute: 0.1 10*3/uL (ref 0.1–1.0)
Monocytes Relative: 8 %
Neutro Abs: 1.3 10*3/uL — ABNORMAL LOW (ref 1.7–7.7)
Neutrophils Relative %: 70 %
Platelet Count: 41 10*3/uL — ABNORMAL LOW (ref 150–400)
RBC: 4.88 MIL/uL (ref 3.87–5.11)
RDW: 19.9 % — ABNORMAL HIGH (ref 11.5–15.5)
WBC Count: 1.9 10*3/uL — ABNORMAL LOW (ref 4.0–10.5)
nRBC: 0 % (ref 0.0–0.2)

## 2022-11-08 LAB — RAD ONC ARIA SESSION SUMMARY
Course Elapsed Days: 41
Plan Fractions Treated to Date: 28
Plan Prescribed Dose Per Fraction: 2 Gy
Plan Total Fractions Prescribed: 33
Plan Total Prescribed Dose: 66 Gy
Reference Point Dosage Given to Date: 56 Gy
Reference Point Session Dosage Given: 2 Gy
Session Number: 28

## 2022-11-08 LAB — SAMPLE TO BLOOD BANK

## 2022-11-08 MED ORDER — HEPARIN SOD (PORK) LOCK FLUSH 100 UNIT/ML IV SOLN: 500.0000 [IU] | Freq: Once | INTRAVENOUS | Status: DC

## 2022-11-08 MED ORDER — SODIUM CHLORIDE 0.9% FLUSH: 10.0000 mL | Freq: Once | INTRAVENOUS | Status: DC

## 2022-11-09 ENCOUNTER — Other Ambulatory Visit: Payer: Self-pay

## 2022-11-09 ENCOUNTER — Ambulatory Visit
Admission: RE | Admit: 2022-11-09 | Discharge: 2022-11-09 | Disposition: A | Discharge status: Home / Self Care | Payer: Medicare Other | Source: Ambulatory Visit | Attending: Radiation Oncology | Admitting: Radiation Oncology

## 2022-11-09 DIAGNOSIS — Z510 Encounter for antineoplastic radiation therapy: Secondary | ICD-10-CM | POA: Diagnosis not present

## 2022-11-09 DIAGNOSIS — Z87891 Personal history of nicotine dependence: Secondary | ICD-10-CM | POA: Diagnosis not present

## 2022-11-09 DIAGNOSIS — Z5111 Encounter for antineoplastic chemotherapy: Secondary | ICD-10-CM | POA: Diagnosis not present

## 2022-11-09 DIAGNOSIS — C3412 Malignant neoplasm of upper lobe, left bronchus or lung: Secondary | ICD-10-CM | POA: Diagnosis not present

## 2022-11-09 LAB — RAD ONC ARIA SESSION SUMMARY
Course Elapsed Days: 42
Plan Fractions Treated to Date: 29
Plan Prescribed Dose Per Fraction: 2 Gy
Plan Total Fractions Prescribed: 33
Plan Total Prescribed Dose: 66 Gy
Reference Point Dosage Given to Date: 58 Gy
Reference Point Session Dosage Given: 2 Gy
Session Number: 29

## 2022-11-10 ENCOUNTER — Other Ambulatory Visit: Payer: Self-pay

## 2022-11-10 ENCOUNTER — Inpatient Hospital Stay: Payer: Medicare Other

## 2022-11-10 ENCOUNTER — Ambulatory Visit
Admission: RE | Admit: 2022-11-10 | Discharge: 2022-11-10 | Disposition: A | Discharge status: Home / Self Care | Payer: Medicare Other | Source: Ambulatory Visit | Attending: Radiation Oncology | Admitting: Radiation Oncology

## 2022-11-10 DIAGNOSIS — C3412 Malignant neoplasm of upper lobe, left bronchus or lung: Secondary | ICD-10-CM | POA: Diagnosis not present

## 2022-11-10 DIAGNOSIS — Z87891 Personal history of nicotine dependence: Secondary | ICD-10-CM | POA: Diagnosis not present

## 2022-11-10 DIAGNOSIS — Z5111 Encounter for antineoplastic chemotherapy: Secondary | ICD-10-CM | POA: Diagnosis not present

## 2022-11-10 DIAGNOSIS — Z510 Encounter for antineoplastic radiation therapy: Secondary | ICD-10-CM | POA: Diagnosis not present

## 2022-11-10 LAB — RAD ONC ARIA SESSION SUMMARY
Course Elapsed Days: 43
Plan Fractions Treated to Date: 30
Plan Prescribed Dose Per Fraction: 2 Gy
Plan Total Fractions Prescribed: 33
Plan Total Prescribed Dose: 66 Gy
Reference Point Dosage Given to Date: 60 Gy
Reference Point Session Dosage Given: 2 Gy
Session Number: 30

## 2022-11-11 ENCOUNTER — Inpatient Hospital Stay: Payer: Medicare Other

## 2022-11-11 ENCOUNTER — Ambulatory Visit
Admission: RE | Admit: 2022-11-11 | Discharge: 2022-11-11 | Disposition: A | Discharge status: Home / Self Care | Payer: Medicare Other | Source: Ambulatory Visit | Attending: Radiation Oncology | Admitting: Radiation Oncology

## 2022-11-11 ENCOUNTER — Other Ambulatory Visit: Payer: Self-pay

## 2022-11-11 DIAGNOSIS — Z87891 Personal history of nicotine dependence: Secondary | ICD-10-CM | POA: Diagnosis not present

## 2022-11-11 DIAGNOSIS — C3412 Malignant neoplasm of upper lobe, left bronchus or lung: Secondary | ICD-10-CM | POA: Diagnosis not present

## 2022-11-11 DIAGNOSIS — C3490 Malignant neoplasm of unspecified part of unspecified bronchus or lung: Secondary | ICD-10-CM | POA: Diagnosis not present

## 2022-11-11 DIAGNOSIS — Z510 Encounter for antineoplastic radiation therapy: Secondary | ICD-10-CM | POA: Diagnosis not present

## 2022-11-11 LAB — RAD ONC ARIA SESSION SUMMARY
Course Elapsed Days: 44
Plan Fractions Treated to Date: 31
Plan Prescribed Dose Per Fraction: 2 Gy
Plan Total Fractions Prescribed: 33
Plan Total Prescribed Dose: 66 Gy
Reference Point Dosage Given to Date: 62 Gy
Reference Point Session Dosage Given: 2 Gy
Session Number: 31

## 2022-11-12 ENCOUNTER — Other Ambulatory Visit: Payer: Self-pay

## 2022-11-12 ENCOUNTER — Inpatient Hospital Stay: Payer: Medicare Other | Admitting: Hematology

## 2022-11-12 ENCOUNTER — Ambulatory Visit
Admission: RE | Admit: 2022-11-12 | Discharge: 2022-11-12 | Disposition: A | Discharge status: Home / Self Care | Payer: Medicare Other | Source: Ambulatory Visit | Attending: Radiation Oncology | Admitting: Radiation Oncology

## 2022-11-12 ENCOUNTER — Inpatient Hospital Stay: Payer: Medicare Other

## 2022-11-12 DIAGNOSIS — Z87891 Personal history of nicotine dependence: Secondary | ICD-10-CM | POA: Diagnosis not present

## 2022-11-12 DIAGNOSIS — C3490 Malignant neoplasm of unspecified part of unspecified bronchus or lung: Secondary | ICD-10-CM

## 2022-11-12 DIAGNOSIS — Z510 Encounter for antineoplastic radiation therapy: Secondary | ICD-10-CM | POA: Diagnosis not present

## 2022-11-12 DIAGNOSIS — C3412 Malignant neoplasm of upper lobe, left bronchus or lung: Secondary | ICD-10-CM | POA: Diagnosis not present

## 2022-11-12 LAB — RAD ONC ARIA SESSION SUMMARY
Course Elapsed Days: 45
Plan Fractions Treated to Date: 32
Plan Prescribed Dose Per Fraction: 2 Gy
Plan Total Fractions Prescribed: 33
Plan Total Prescribed Dose: 66 Gy
Reference Point Dosage Given to Date: 64 Gy
Reference Point Session Dosage Given: 2 Gy
Session Number: 32

## 2022-11-12 MED ORDER — SONAFINE EX EMUL
1.0000 | Freq: Two times a day (BID) | CUTANEOUS | Status: DC
  Administered 2022-11-12: 1 via TOPICAL

## 2022-11-12 MED FILL — Dexamethasone Sodium Phosphate Inj 100 MG/10ML: INTRAMUSCULAR | Qty: 1 | Status: AC

## 2022-11-12 MED FILL — Fosaprepitant Dimeglumine For IV Infusion 150 MG (Base Eq): INTRAVENOUS | Qty: 5 | Status: AC

## 2022-11-13 DIAGNOSIS — J449 Chronic obstructive pulmonary disease, unspecified: Secondary | ICD-10-CM | POA: Diagnosis not present

## 2022-11-14 NOTE — Assessment & Plan Note (Signed)
-  [id number], stage III, limited stage  -found on screening CT chest on July 22, 2022 which showed suprahilar left upper lobe pulmonary mass and mediastinal adenopathy -PET scan from August 08, 2022 showed hypermetabolic left hilar lung mass with mediastinal and left supraclavicular nodal metastasis. 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 Brain MRI negative  -started concurrent chemo cisplatin/etoposide and radiation 09/29/2022 -She tolerated first cycle chemotherapy well. C2 chemo postponed last week due to cytopenia  -she is tolerating treatment overall well, mild odynophagia, she is on sucralfate

## 2022-11-15 ENCOUNTER — Inpatient Hospital Stay: Payer: Medicare Other

## 2022-11-15 ENCOUNTER — Inpatient Hospital Stay: Payer: Medicare Other | Attending: Hematology

## 2022-11-15 ENCOUNTER — Other Ambulatory Visit: Payer: Self-pay

## 2022-11-15 ENCOUNTER — Inpatient Hospital Stay: Payer: Medicare Other | Admitting: Hematology

## 2022-11-15 ENCOUNTER — Ambulatory Visit
Admission: RE | Admit: 2022-11-15 | Discharge: 2022-11-15 | Disposition: A | Discharge status: Home / Self Care | Payer: Medicare Other | Source: Ambulatory Visit | Attending: Radiation Oncology | Admitting: Radiation Oncology

## 2022-11-15 ENCOUNTER — Encounter: Payer: Self-pay | Admitting: Urology

## 2022-11-15 VITALS — BP 97/61 | HR 74 | Resp 18 | Ht 65.0 in | Wt 104.4 lb

## 2022-11-15 DIAGNOSIS — C3412 Malignant neoplasm of upper lobe, left bronchus or lung: Secondary | ICD-10-CM | POA: Insufficient documentation

## 2022-11-15 DIAGNOSIS — Z170 Estrogen receptor positive status [ER+]: Secondary | ICD-10-CM | POA: Diagnosis not present

## 2022-11-15 DIAGNOSIS — Z5111 Encounter for antineoplastic chemotherapy: Secondary | ICD-10-CM | POA: Diagnosis not present

## 2022-11-15 DIAGNOSIS — C3490 Malignant neoplasm of unspecified part of unspecified bronchus or lung: Secondary | ICD-10-CM

## 2022-11-15 DIAGNOSIS — Z5189 Encounter for other specified aftercare: Secondary | ICD-10-CM | POA: Diagnosis not present

## 2022-11-15 DIAGNOSIS — Z79811 Long term (current) use of aromatase inhibitors: Secondary | ICD-10-CM | POA: Diagnosis not present

## 2022-11-15 DIAGNOSIS — C778 Secondary and unspecified malignant neoplasm of lymph nodes of multiple regions: Secondary | ICD-10-CM | POA: Diagnosis not present

## 2022-11-15 DIAGNOSIS — Z452 Encounter for adjustment and management of vascular access device: Secondary | ICD-10-CM | POA: Insufficient documentation

## 2022-11-15 DIAGNOSIS — J449 Chronic obstructive pulmonary disease, unspecified: Secondary | ICD-10-CM | POA: Diagnosis not present

## 2022-11-15 DIAGNOSIS — D751 Secondary polycythemia: Secondary | ICD-10-CM

## 2022-11-15 DIAGNOSIS — Z87891 Personal history of nicotine dependence: Secondary | ICD-10-CM | POA: Diagnosis not present

## 2022-11-15 DIAGNOSIS — Z510 Encounter for antineoplastic radiation therapy: Secondary | ICD-10-CM | POA: Diagnosis not present

## 2022-11-15 DIAGNOSIS — C50212 Malignant neoplasm of upper-inner quadrant of left female breast: Secondary | ICD-10-CM | POA: Diagnosis not present

## 2022-11-15 DIAGNOSIS — Z95828 Presence of other vascular implants and grafts: Secondary | ICD-10-CM

## 2022-11-15 LAB — CMP (CANCER CENTER ONLY)
ALT: 9 U/L (ref 0–44)
AST: 13 U/L — ABNORMAL LOW (ref 15–41)
Albumin: 4 g/dL (ref 3.5–5.0)
Alkaline Phosphatase: 45 U/L (ref 38–126)
Anion gap: 5 (ref 5–15)
BUN: 8 mg/dL (ref 8–23)
CO2: 28 mmol/L (ref 22–32)
Calcium: 9.2 mg/dL (ref 8.9–10.3)
Chloride: 105 mmol/L (ref 98–111)
Creatinine: 0.4 mg/dL — ABNORMAL LOW (ref 0.44–1.00)
GFR, Estimated: >60 mL/min (ref >60)
Glucose, Bld: 110 mg/dL — ABNORMAL HIGH (ref 70–99)
Potassium: 4.4 mmol/L (ref 3.5–5.1)
Sodium: 138 mmol/L (ref 135–145)
Total Bilirubin: 0.4 mg/dL (ref 0.3–1.2)
Total Protein: 6.3 g/dL — ABNORMAL LOW (ref 6.5–8.1)

## 2022-11-15 LAB — RAD ONC ARIA SESSION SUMMARY
Course Elapsed Days: 48
Plan Fractions Treated to Date: 33
Plan Prescribed Dose Per Fraction: 2 Gy
Plan Total Fractions Prescribed: 33
Plan Total Prescribed Dose: 66 Gy
Reference Point Dosage Given to Date: 66 Gy
Reference Point Session Dosage Given: 2 Gy
Session Number: 33

## 2022-11-15 LAB — CBC WITH DIFFERENTIAL (CANCER CENTER ONLY)
Abs Immature Granulocytes: 0.02 10*3/uL (ref 0.00–0.07)
Basophils Absolute: 0 10*3/uL (ref 0.0–0.1)
Basophils Relative: 1 %
Eosinophils Absolute: 0.1 10*3/uL (ref 0.0–0.5)
Eosinophils Relative: 3 %
HCT: 37.6 % (ref 36.0–46.0)
Hemoglobin: 12.2 g/dL (ref 12.0–15.0)
Immature Granulocytes: 1 %
Lymphocytes Relative: 19 %
Lymphs Abs: 0.3 10*3/uL — ABNORMAL LOW (ref 0.7–4.0)
MCH: 25 pg — ABNORMAL LOW (ref 26.0–34.0)
MCHC: 32.4 g/dL (ref 30.0–36.0)
MCV: 77 fL — ABNORMAL LOW (ref 80.0–100.0)
Monocytes Absolute: 0.5 10*3/uL (ref 0.1–1.0)
Monocytes Relative: 28 %
Neutro Abs: 0.8 10*3/uL — ABNORMAL LOW (ref 1.7–7.7)
Neutrophils Relative %: 48 %
Platelet Count: 171 10*3/uL (ref 150–400)
RBC: 4.88 MIL/uL (ref 3.87–5.11)
RDW: 21.2 % — ABNORMAL HIGH (ref 11.5–15.5)
WBC Count: 1.6 10*3/uL — ABNORMAL LOW (ref 4.0–10.5)
nRBC: 0 % (ref 0.0–0.2)

## 2022-11-15 LAB — FERRITIN: Ferritin: 20 ng/mL (ref 11–307)

## 2022-11-15 LAB — SAMPLE TO BLOOD BANK

## 2022-11-15 LAB — MAGNESIUM: Magnesium: 2.2 mg/dL (ref 1.7–2.4)

## 2022-11-15 MED ORDER — MAGNESIUM SULFATE 2 GM/50ML IV SOLN
2.0000 g | Freq: Once | INTRAVENOUS | Status: AC
  Administered 2022-11-15: 2 g via INTRAVENOUS
  Filled 2022-11-15: qty 50

## 2022-11-15 MED ORDER — POTASSIUM CHLORIDE IN NACL 20-0.9 MEQ/L-% IV SOLN
Freq: Once | INTRAVENOUS | Status: AC
  Filled 2022-11-15: qty 1000

## 2022-11-15 MED ORDER — SODIUM CHLORIDE 0.9 % IV SOLN: Freq: Once | INTRAVENOUS | Status: AC

## 2022-11-15 MED ORDER — SODIUM CHLORIDE 0.9 % IV SOLN
80.0000 mg/m2 | Freq: Once | INTRAVENOUS | Status: AC
  Administered 2022-11-15: 118 mg via INTRAVENOUS
  Filled 2022-11-15: qty 118

## 2022-11-15 MED ORDER — SODIUM CHLORIDE 0.9 % IV SOLN
150.0000 mg | Freq: Once | INTRAVENOUS | Status: AC
  Administered 2022-11-15: 150 mg via INTRAVENOUS
  Filled 2022-11-15: qty 150

## 2022-11-15 MED ORDER — SODIUM CHLORIDE 0.9% FLUSH
10.0000 mL | Freq: Once | INTRAVENOUS | Status: AC
  Administered 2022-11-15: 10 mL

## 2022-11-15 MED ORDER — SODIUM CHLORIDE 0.9 % IV SOLN
100.0000 mg/m2 | Freq: Once | INTRAVENOUS | Status: AC
  Administered 2022-11-15: 148 mg via INTRAVENOUS
  Filled 2022-11-15: qty 7.4

## 2022-11-15 MED ORDER — PALONOSETRON HCL INJECTION 0.25 MG/5ML
0.2500 mg | Freq: Once | INTRAVENOUS | Status: AC
  Administered 2022-11-15: 0.25 mg via INTRAVENOUS
  Filled 2022-11-15: qty 5

## 2022-11-15 MED ORDER — SODIUM CHLORIDE 0.9% FLUSH
10.0000 mL | INTRAVENOUS | Status: DC | PRN
  Administered 2022-11-15: 10 mL

## 2022-11-15 MED ORDER — SODIUM CHLORIDE 0.9 % IV SOLN
10.0000 mg | Freq: Once | INTRAVENOUS | Status: AC
  Administered 2022-11-15: 10 mg via INTRAVENOUS
  Filled 2022-11-15: qty 10

## 2022-11-15 MED ORDER — HEPARIN SOD (PORK) LOCK FLUSH 100 UNIT/ML IV SOLN
500.0000 [IU] | Freq: Once | INTRAVENOUS | Status: AC | PRN
  Administered 2022-11-15: 500 [IU]

## 2022-11-15 MED FILL — Dexamethasone Sodium Phosphate Inj 100 MG/10ML: INTRAMUSCULAR | Qty: 1 | Status: AC

## 2022-11-15 NOTE — Addendum Note (Signed)
Addended by: Truitt Merle on: 11/15/2022 01:03 PM   Modules accepted: Orders

## 2022-11-15 NOTE — Patient Instructions (Signed)
[facility]  Discharge Instructions: Thank you for choosing [facility] to provide your oncology and hematology care.   If you have a lab appointment with the [facility], please go directly to the [facility] and check in at the registration area.   Wear comfortable clothing and clothing appropriate for easy access to any Portacath or PICC line.   We strive to give you quality time with your provider. You may need to reschedule your appointment if you arrive late (15 or more minutes).  Arriving late affects you and other patients whose appointments are after yours.  Also, if you miss three or more appointments without notifying the office, you may be dismissed from the clinic at the provider's discretion.      For prescription refill requests, have your pharmacy contact our office and allow 72 hours for refills to be completed.    Today you received the following chemotherapy and/or immunotherapy agents: Cisplatin and Etoposide   To help prevent nausea and vomiting after your treatment, we encourage you to take your nausea medication as directed.  BELOW ARE SYMPTOMS THAT SHOULD BE REPORTED IMMEDIATELY: *FEVER GREATER THAN 100.4 F (38 C) OR HIGHER *CHILLS OR SWEATING *NAUSEA AND VOMITING THAT IS NOT CONTROLLED WITH YOUR NAUSEA MEDICATION *UNUSUAL SHORTNESS OF BREATH *UNUSUAL BRUISING OR BLEEDING *URINARY PROBLEMS (pain or burning when urinating, or frequent urination) *BOWEL PROBLEMS (unusual diarrhea, constipation, pain near the anus) TENDERNESS IN MOUTH AND THROAT WITH OR WITHOUT PRESENCE OF ULCERS (sore throat, sores in mouth, or a toothache) UNUSUAL RASH, SWELLING OR PAIN  UNUSUAL VAGINAL DISCHARGE OR ITCHING   Items with * indicate a potential emergency and should be followed up as soon as possible or go to the Emergency Department if any problems should occur.  Please show the CHEMOTHERAPY ALERT CARD or IMMUNOTHERAPY ALERT  CARD at check-in to the Emergency Department and triage nurse.  Should you have questions after your visit or need to cancel or reschedule your appointment, please contact [facility]  Dept: [phone]  and follow the prompts.  Office hours are 8:00 a.m. to 4:30 p.m. Monday - Friday. Please note that voicemails left after 4:00 p.m. may not be returned until the following business day.  We are closed weekends and major holidays. You have access to a nurse at all times for urgent questions. Please call the main number to the clinic Dept: [phone] and follow the prompts.   For any non-urgent questions, you may also contact your provider using MyChart. We now offer e-Visits for anyone 51 and older to request care online for non-urgent symptoms. For details visit [email or web address].   Also download the MyChart app! Go to the app store, search "MyChart", open the app, select [facility], and log in with your MyChart username and password.  Cisplatin Injection What is this medication? CISPLATIN (SIS pla tin) treats some types of cancer. It works by slowing down the growth of cancer cells. This medicine may be used for other purposes; ask your health care provider or pharmacist if you have questions. COMMON BRAND NAME(S): Platinol, Platinol -AQ What should I tell my care team before I take this medication? They need to know if you have any of these conditions: Eye disease, vision problems Hearing problems Kidney disease Low blood counts, such as low white cells, platelets, or red blood cells Tingling of the fingers or toes, or other nerve disorder An unusual or  allergic reaction to cisplatin, carboplatin, oxaliplatin, other medications, foods, dyes, or preservatives If you or your partner are pregnant or trying to get pregnant Breast-feeding How should I use this medication? This medication is injected into a vein. It is given by your care team in a  hospital or clinic setting. Talk to your care team about the use of this medication in children. Special care may be needed. Overdosage: If you think you have taken too much of this medicine contact a poison control center or emergency room at once. NOTE: This medicine is only for you. Do not share this medicine with others. What if I miss a dose? Keep appointments for follow-up doses. It is important not to miss your dose. Call your care team if you are unable to keep an appointment. What may interact with this medication? Do not take this medication with any of the following: Live virus vaccines This medication may also interact with the following: Certain antibiotics, such as amikacin, gentamicin, neomycin, polymyxin B, streptomycin, tobramycin, vancomycin Foscarnet This list may not describe all possible interactions. Give your health care provider a list of all the medicines, herbs, non-prescription drugs, or dietary supplements you use. Also tell them if you smoke, drink alcohol, or use illegal drugs. Some items may interact with your medicine. What should I watch for while using this medication? Your condition will be monitored carefully while you are receiving this medication. You may need blood work done while taking this medication. This medication may make you feel generally unwell. This is not uncommon, as chemotherapy can affect healthy cells as well as cancer cells. Report any side effects. Continue your course of treatment even though you feel ill unless your care team tells you to stop. This medication may increase your risk of getting an infection. Call your care team for advice if you get a fever, chills, sore throat, or other symptoms of a cold or flu. Do not treat yourself. Try to avoid being around people who are sick. Avoid taking medications that contain aspirin, acetaminophen, ibuprofen, naproxen, or ketoprofen unless instructed by your care team. These medications may hide a  fever. This medication may increase your risk to bruise or bleed. Call your care team if you notice any unusual bleeding. Be careful brushing or flossing your teeth or using a toothpick because you may get an infection or bleed more easily. If you have any dental work done, tell your dentist you are receiving this medication. Drink fluids as directed while you are taking this medication. This will help protect your kidneys. Call your care team if you get diarrhea. Do not treat yourself. Talk to your care team if you or your partner wish to become pregnant or think you might be pregnant. This medication can cause serious birth defects if taken during pregnancy and for 14 months after the last dose. A negative pregnancy test is required before starting this medication. A reliable form of contraception is recommended while taking this medication and for 14 months after the last dose. Talk to your care team about effective forms of contraception. Do not father a child while taking this medication and for 11 months after the last dose. Use a condom during sex during this time period. Do not breast-feed while taking this medication. This medication may cause infertility. Talk to your care team if you are concerned about your fertility. What side effects may I notice from receiving this medication? Side effects that you should report to your care team  as soon as possible: Allergic reactions--skin rash, itching, hives, swelling of the face, lips, tongue, or throat Eye pain, change in vision, vision loss Hearing loss, ringing in ears Infection--fever, chills, cough, sore throat, wounds that don't heal, pain or trouble when passing urine, general feeling of discomfort or being unwell Kidney injury--decrease in the amount of urine, swelling of the ankles, hands, or feet Low red blood cell level--unusual weakness or fatigue, dizziness, headache, trouble breathing Painful swelling, warmth, or redness of the skin,  blisters or sores at the infusion site Pain, tingling, or numbness in the hands or feet Unusual bruising or bleeding Side effects that usually do not require medical attention (report to your care team if they continue or are bothersome): Hair loss Nausea Vomiting This list may not describe all possible side effects. Call your doctor for medical advice about side effects. You may report side effects to FDA at 1-800-FDA-1088. Where should I keep my medication? This medication is given in a hospital or clinic. It will not be stored at home. NOTE: This sheet is a summary. It may not cover all possible information. If you have questions about this medicine, talk to your doctor, pharmacist, or health care provider.  2023 Elsevier/Gold Standard (2022-01-22 00:00:00)  Etoposide Injection What is this medication? ETOPOSIDE (e toe POE side) treats some types of cancer. It works by slowing down the growth of cancer cells. This medicine may be used for other purposes; ask your health care provider or pharmacist if you have questions. COMMON BRAND NAME(S): Etopophos, Toposar, VePesid What should I tell my care team before I take this medication? They need to know if you have any of these conditions: Infection Kidney disease Liver disease Low blood counts, such as low white cell, platelet, red cell counts An unusual or allergic reaction to etoposide, other medications, foods, dyes, or preservatives If you or your partner are pregnant or trying to get pregnant Breastfeeding How should I use this medication? This medication is injected into a vein. It is given by your care team in a hospital or clinic setting. Talk to your care team about the use of this medication in children. Special care may be needed. Overdosage: If you think you have taken too much of this medicine contact a poison control center or emergency room at once. NOTE: This medicine is only for you. Do not share this medicine with  others. What if I miss a dose? Keep appointments for follow-up doses. It is important not to miss your dose. Call your care team if you are unable to keep an appointment. What may interact with this medication? Warfarin This list may not describe all possible interactions. Give your health care provider a list of all the medicines, herbs, non-prescription drugs, or dietary supplements you use. Also tell them if you smoke, drink alcohol, or use illegal drugs. Some items may interact with your medicine. What should I watch for while using this medication? Your condition will be monitored carefully while you are receiving this medication. This medication may make you feel generally unwell. This is not uncommon as chemotherapy can affect healthy cells as well as cancer cells. Report any side effects. Continue your course of treatment even though you feel ill unless your care team tells you to stop. This medication can cause serious side effects. To reduce the risk, your care team may give you other medications to take before receiving this one. Be sure to follow the directions from your care team. This medication  may increase your risk of getting an infection. Call your care team for advice if you get a fever, chills, sore throat, or other symptoms of a cold or flu. Do not treat yourself. Try to avoid being around people who are sick. This medication may increase your risk to bruise or bleed. Call your care team if you notice any unusual bleeding. Talk to your care team about your risk of cancer. You may be more at risk for certain types of cancers if you take this medication. Talk to your care team if you may be pregnant. Serious birth defects can occur if you take this medication during pregnancy and for 6 months after the last dose. You will need a negative pregnancy test before starting this medication. Contraception is recommended while taking this medication and for 6 months after the last dose. Your  care team can help you find the option that works for you. If your partner can get pregnant, use a condom during sex while taking this medication and for 4 months after the last dose. Do not breastfeed while taking this medication. This medication may cause infertility. Talk to your care team if you are concerned about your fertility. What side effects may I notice from receiving this medication? Side effects that you should report to your care team as soon as possible: Allergic reactions--skin rash, itching, hives, swelling of the face, lips, tongue, or throat Infection--fever, chills, cough, sore throat, wounds that don't heal, pain or trouble when passing urine, general feeling of discomfort or being unwell Low red blood cell level--unusual weakness or fatigue, dizziness, headache, trouble breathing Unusual bruising or bleeding Side effects that usually do not require medical attention (report to your care team if they continue or are bothersome): Diarrhea Fatigue Hair loss Loss of appetite Nausea Vomiting This list may not describe all possible side effects. Call your doctor for medical advice about side effects. You may report side effects to FDA at 1-800-FDA-1088. Where should I keep my medication? This medication is given in a hospital or clinic. It will not be stored at home. NOTE: This sheet is a summary. It may not cover all possible information. If you have questions about this medicine, talk to your doctor, pharmacist, or health care provider.  2023 Elsevier/Gold Standard (2007-11-18 00:00:00)   **Take oral Magnesium as prescribed. Hypomagnesemia Hypomagnesemia is a condition in which the level of magnesium in the blood is too low. Magnesium is a mineral that is found in many foods. It is used in many different processes in the body. Hypomagnesemia can affect every organ in the body. In severe cases, it can cause life-threatening problems. What are the causes? This condition  may be caused by: Not getting enough magnesium in your diet or not having enough healthy foods to eat (malnutrition). Problems with magnesium absorption in the intestines. Dehydration. Excessive use of alcohol. Vomiting. Severe or long-term (chronic) diarrhea. Some medicines, including medicines that make you urinate more often (diuretics). Certain diseases, such as kidney disease, diabetes, celiac disease, and overactive thyroid. What are the signs or symptoms? Symptoms of this condition include: Loss of appetite, nausea, and vomiting. Involuntary shaking or trembling of a body part (tremor). Muscle weakness or tingling in the arms and legs. Sudden tightening of muscles (muscle spasms). Confusion. Psychiatric issues, such as: Depression and irritability. Psychosis. A feeling of fluttering of the heart (palpitations). Seizures. These symptoms are more severe if magnesium levels drop suddenly. How is this diagnosed? This condition may be diagnosed  based on: Your symptoms and medical history. A physical exam. Blood and urine tests. How is this treated? Treatment depends on the cause and the severity of the condition. It may be treated by: Taking a magnesium supplement. This can be taken in pill form. If the condition is severe, magnesium is usually given through an IV. Making changes to your diet. You may be directed to eat foods that have a lot of magnesium, such as green leafy vegetables, peas, beans, and nuts. Not drinking alcohol. If you are struggling not to drink, ask your health care provider for help. Follow these instructions at home: Eating and drinking     Make sure that your diet includes foods with magnesium. Foods that have a lot of magnesium in them include: Green leafy vegetables, such as spinach and broccoli. Beans and peas. Nuts and seeds, such as almonds and sunflower seeds. Whole grains, such as whole grain bread and fortified cereals. Drink fluids that  contain salts and minerals (electrolytes), such as sports drinks, when you are active. Do not drink alcohol. General instructions Take over-the-counter and prescription medicines only as told by your health care provider. Take magnesium supplements as directed if your health care provider tells you to take them. Have your magnesium levels monitored as told by your health care provider. Keep all follow-up visits. This is important. Contact a health care provider if: You get worse instead of better. Your symptoms return. Get help right away if: You develop severe muscle weakness. You have trouble breathing. You feel that your heart is racing. These symptoms may represent a serious problem that is an emergency. Do not wait to see if the symptoms will go away. Get medical help right away. Call your local emergency services (911 in the [place] Do not drive yourself to the hospital. Summary Hypomagnesemia is a condition in which the level of magnesium in the blood is too low. Hypomagnesemia can affect every organ in the body. Treatment may include eating more foods that contain magnesium, taking magnesium supplements, and not drinking alcohol. Have your magnesium levels monitored as told by your health care provider. This information is not intended to replace advice given to you by your health care provider. Make sure you discuss any questions you have with your health care provider. Document Revised: 02/24/2021 Document Reviewed: 02/24/2021 Elsevier Patient Education  [place].

## 2022-11-15 NOTE — Progress Notes (Signed)
[facility]   Telephone:(336) [phone] Fax:([phone]) [phone]   Clinic Follow up Note   Patient Care Team: Merrilee Seashore, MD as PCP - General (Internal Medicine) Minus Breeding, MD as PCP - Cardiology (Cardiology) [name], MD as Consulting Physician (General Surgery) Truitt Merle, MD as Consulting Physician (Hematology) Kyung Rudd, MD as Consulting Physician (Radiation Oncology) Gardenia Phlegm, NP as Nurse Practitioner (Hematology and Oncology)  Date of Service:  11/15/2022  CHIEF COMPLAINT: f/u of small cell lung cancer   CURRENT THERAPY:  concurrent chemo radiation with cisplatin and etoposide every 3 weeks   ASSESSMENT:  Monica [name] is a 77 y.o. female with   Small cell lung cancer ([place]) -[id number], stage III, limited stage  -found on screening CT chest on July 22, 2022 which showed suprahilar left upper lobe pulmonary mass and mediastinal adenopathy -PET scan from August 08, 2022 showed hypermetabolic left hilar lung mass with mediastinal and left supraclavicular nodal metastasis. 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 Brain MRI negative  -started concurrent chemo cisplatin/etoposide and radiation 09/29/2022 -She tolerated first cycle chemotherapy well. C2 chemo postponed last week due to cytopenia  -she is tolerating treatment overall well, mild odynophagia, she is on sucralfate  -She completed her radiation today.  Lab reviewed, thrombocytopenia has resolved, but her neutropenia is slightly worse.  Will proceed to cycle 3 chemotherapy today, I will get insurance approval for G-CSF, to be given on day 5 of the cycle. -Close lab monitoring to see if she needs blood transfusion -next cycle chemo in 3 weeks     PLAN: -lab reviewed, will proceed to cycle 3 chemotherapy - Discuss adding Udenyca on day 5 of this cycle for neutropenia, she knows to take Claritin daily for 5 days -She  will come in next 2 days before it topside -Lab in about 10 days -Lab, follow-up and next cycle of chemotherapy in 3 weeks  SUMMARY OF ONCOLOGIC HISTORY: Oncology History Overview Note  Cancer Staging Malignant neoplasm of upper-inner quadrant of left breast in female, estrogen receptor positive ([place]) Staging form: Breast, AJCC 8th Edition - Clinical stage from 02/16/2017: Stage IA (cT1b, cN0, cM0, G2, ER: Positive, PR: Positive, HER2: Negative) - Unsigned Staging comments: Staged at breast conference  - Pathologic stage from 03/16/2017: Stage IA (pT1b, pN0, cM0, G2, ER: Positive, PR: Positive, HER2: Negative, Oncotype DX score: 27) - Signed by Truitt Merle, MD on 04/24/2017     Malignant neoplasm of upper-inner quadrant of left breast in female, estrogen receptor positive ([place])  02/09/2017 Initial Diagnosis   Malignant neoplasm of upper-inner quadrant of left breast in female, estrogen receptor positive ([place])   02/09/2017 Initial Biopsy   Diagnosis Breast, left, needle core biopsy - INVASIVE DUCTAL CARCINOMA, 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 Dalbert Batman    03/16/2017 Pathology Results   PATHOLOGY REPORT Diagnosis 03/16/17 1. Breast, lumpectomy, Left - INVASIVE DUCTAL CARCINOMA, NOTTINGHAM GRADE 2 OF 3, 0.9 CM - DUCTAL CARCINOMA IN SITU - MARGINS UNINVOLVED BY CARCINOMA (0.3 CM POSTERIOR MARGIN) - PREVIOUS BIOPSY SITE CHANGES - SEE ONCOLOGY TABLE BELOW 2. Lymph node, sentinel, biopsy, Left axillary #1 - NO CARCINOMA IDENTIFIED  IN ONE LYMPH NODE (0/1) 3. Lymph node, sentinel, biopsy, Left axillary #2 - NO CARCINOMA IDENTIFIED IN ONE LYMPH NODE (0/1) 4.  Lymph node, sentinel, biopsy, Left axillary #3 - NO CARCINOMA IDENTIFIED IN ONE LYMPH NODE (0/1) 5. Lymph node, sentinel, biopsy, Left axillary #4 - NO CARCINOMA IDENTIFIED IN ONE LYMPH NODE (0/1)    03/16/2017 Oncotype testing   Recurrance score of 27 with a 18% distance recurrance in the net 10 years with Tamoxifen alone    05/19/2017 - 06/16/2017 Radiation Therapy   Radiation with Dr. Lisbeth Renshaw   06/2017 -  Anti-estrogen oral therapy   anastrozole 1 mg once a day starting late 06/2017     04/29/2020 Pathology Results   DIAGNOSIS:   BONE MARROW, ASPIRATE, CLOT, CORE:  -  Cellular marrow with trilineage hematopoiesis  -  No morphologic evidence of carcinoma or hematopoietic neoplasm  -  See microscopic description below   PERIPHERAL BLOOD:  -  Polycythemia and thrombocytopenia  -  See complete blood cell count   MICROSCOPIC DESCRIPTION:   PERIPHERAL BLOOD SMEAR: The peripheral blood has a polycythemia and  thrombocytopenia.  Leukocytes are morphologically unremarkable.   BONE MARROW ASPIRATE: Spicular, cellular and adequate for evaluation  Erythroid precursors: Orderly maturation without overt dysplasia  Granulocytic precursors: Orderly maturation without overt dysplasia  Megakaryocytes: Qualitatively and quantitatively unremarkable  Lymphocytes/plasma cells: No lymphocytosis or plasmacytosis    Small cell lung cancer ([place])  09/14/2022 Cancer Staging   Staging form: Lung, AJCC 8th Edition - Clinical stage from 09/14/2022: Stage IIIB (cT2, cN3, cM0) - Signed by Truitt Merle, MD on 09/16/2022   09/16/2022 Initial Diagnosis   Small cell lung cancer ([place])   09/29/2022 -  Chemotherapy   Patient is on Treatment Plan : LUNG SMALL CELL Cisplatin (80) D1 + Etoposide (100) D1-3 q21d        INTERVAL HISTORY:  AVRIELLE [name] is here for a follow up of small cell lung cancer  She was last seen by me on 11/05/2022 She presents to the clinic  alone. Pt reports everything is good. Pt states it  feels like something is in her throat. Pt state she cough every once In a while.       All other systems were reviewed with the patient and are negative.  MEDICAL HISTORY:  Past Medical History:  Diagnosis Date   CAD (coronary artery disease)    Cypher stent 09/2002   Cancer ([place]) 2018   left breast, radiation therapy   CHF (congestive heart failure) ([place])    Depression    Diabetes mellitus ([place])    Hyperlipidemia    Hypertension    Myocardial infarction ([place])    2003,went into cardiac shock   Osteoporosis    Seizures ([place])    1st seizure age late 39's; most recent 07/28/17   Sinusitis     SURGICAL HISTORY: Past Surgical History:  Procedure Laterality Date   ABDOMINAL HYSTERECTOMY     APPENDECTOMY     BREAST LUMPECTOMY WITH RADIOACTIVE SEED AND SENTINEL LYMPH NODE BIOPSY Left 03/16/2017   Procedure: INJECT BLUE DYE LEFT BREAST, LEFT BREAST LUMPECTOMY WITH RADIOACTIVE SEED AND LEFT AXILLARY DEEP SENTINEL LYMPH NODE  BIOPSY ADJACENT TISSUE TRANSFER;  Surgeon: [name], MD;  Location: [facility];  Service: General;  Laterality: Left;   CARPAL TUNNEL RELEASE Bilateral    COLONOSCOPY WITH PROPOFOL N/A 05/30/2015   Procedure: COLONOSCOPY WITH PROPOFOL;  Surgeon: Carol Ada, MD;  Location: [facility];  Service: Endoscopy;  Laterality: N/A;   COLONOSCOPY WITH PROPOFOL N/A 06/09/2018   Procedure: COLONOSCOPY WITH PROPOFOL;  Surgeon: Carol Ada, MD;  Location: [facility];  Service: Endoscopy;  Laterality: N/A;   COLONOSCOPY WITH PROPOFOL N/A 07/31/2021   Procedure: COLONOSCOPY WITH PROPOFOL;  Surgeon: Carol Ada, MD;  Location: [facility];  Service: Endoscopy;  Laterality: N/A;   CORONARY ANGIOPLASTY WITH STENT PLACEMENT  10-02-2002   ESOPHAGOGASTRODUODENOSCOPY (EGD) WITH PROPOFOL N/A 07/31/2021   Procedure: ESOPHAGOGASTRODUODENOSCOPY (EGD) WITH PROPOFOL;  Surgeon: Carol Ada, MD;  [facility];  Service: Endoscopy;  Laterality: N/A;   Exploratory abdominal      Bleeding in abdomen after tubal   IR IMAGING GUIDED PORT INSERTION  09/28/2022   POLYPECTOMY  06/09/2018   Procedure: POLYPECTOMY;  Surgeon: Carol Ada, MD;  [facility];  Service: Endoscopy;;   POLYPECTOMY  07/31/2021   Procedure: POLYPECTOMY;  Surgeon: Carol Ada, MD;  Location: [facility];  Service: Endoscopy;;   TUBAL LIGATION     UMBILICAL HERNIA REPAIR      I have reviewed the social history and family history with the patient and they are unchanged from previous note.  ALLERGIES:  is allergic to ace inhibitors, keflex [cephalexin], phenobarbital, sulfate, altace [ramipril], codeine, and dicyclomine hcl.  MEDICATIONS:  Current Outpatient Medications  Medication Sig Dispense Refill   acetaminophen (TYLENOL) 500 MG tablet Take 1,000 mg by mouth every 6 (six) hours as needed for moderate pain or headache.     albuterol (VENTOLIN HFA) 108 (90 Base) MCG/ACT inhaler INHALE 2 PUFFS BY MOUTH EVERY 6 HOURS AS NEEDED FOR WHEEZING OR  SHORTHNESS  OF  BREATH 9 g 11   anastrozole (ARIMIDEX) 1 MG tablet Take 1 tablet (1 mg total) by mouth daily. 90 tablet 1   aspirin EC 81 MG tablet Take 81 mg by mouth at bedtime.     atorvastatin (LIPITOR) 40 MG tablet Take 40 mg by mouth at bedtime.     B-D ULTRAFINE III SHORT PEN 31G X 8 MM MISC Inject into the skin as directed.     Calcium Carbonate-Vitamin D (CALTRATE 600+D PO) Take 1 tablet by mouth in the morning and at bedtime.     carvedilol (COREG) 3.125 MG tablet Take 3.125 mg by mouth 2 (two) times daily.      cetirizine (ZYRTEC) 10 MG tablet Take 10 mg by mouth daily as needed for allergies.     clonazePAM (KLONOPIN) 1 MG tablet Take 1 mg by mouth in the morning and at bedtime.     Fluticasone-Umeclidin-Vilant (TRELEGY ELLIPTA) 100-62.5-25 MCG/ACT AEPB INHALE 1 PUFF ONCE DAILY RINSE  MOUTH  AFTER 60 each 11   Insulin Detemir (LEVEMIR) 100 UNIT/ML Pen Inject 17-25 Units into the skin See admin instructions. Inject 25 units  subcutaneously in the morning and inject 17 units subcutaneously at night     insulin lispro (HUMALOG) 100 UNIT/ML KwikPen Inject 7-9 Units into the skin See admin instructions. Inject 7 units in the morning, 7 units at lunch, and 9 units at night     ketorolac (ACULAR) 0.5 % ophthalmic solution Place 1 drop into the left eye 4 (four) times daily.     levETIRAcetam (KEPPRA) 500 MG tablet Take 1 tablet (500 mg total) by mouth 2 (two) times daily. 180 tablet 3   lidocaine-prilocaine (EMLA) cream Apply to affected area once 30 g 3   losartan (COZAAR) 50 MG tablet Take 25 mg by mouth at bedtime.     magnesium oxide (MAG-OX) 400 (  240 Mg) MG tablet Take 1 tablet (400 mg total) by mouth daily. 60 tablet 1   moxifloxacin (VIGAMOX) 0.5 % ophthalmic solution Place 1 drop into the left eye 4 (four) times daily.     nitroGLYCERIN (NITROSTAT) 0.4 MG SL tablet Place 1 tablet (0.4 mg total) under the tongue every 5 (five) minutes as needed for chest pain. 25 tablet 2   Omega-3 Fatty Acids (FISH OIL) 1000 MG CAPS Take 1,000 mg by mouth in the morning and at bedtime.     omeprazole (PRILOSEC) 20 MG capsule Take 20 mg by mouth in the morning.     ondansetron (ZOFRAN) 8 MG tablet Take 1 tablet (8 mg total) by mouth every 8 (eight) hours as needed for nausea or vomiting. Start on the third day after cisplatin. 30 tablet 1   ONETOUCH VERIO test strip USE THREE TIMES A DAY AND AS NEEDED FOR HYPOGLYCEMIA     OXYGEN Inhale 2 L into the lungs at bedtime.     pioglitazone (ACTOS) 15 MG tablet Take 15 mg by mouth at bedtime.     Polyethyl Glycol-Propyl Glycol 0.4-0.3 % SOLN Place 1 drop into both eyes 2 (two) times daily as needed (itchy/irritated eyes.).     prednisoLONE acetate (PRED FORTE) 1 % ophthalmic suspension Place 1 drop into the left eye 4 (four) times daily.     prochlorperazine (COMPAZINE) 10 MG tablet Take 1 tablet (10 mg total) by mouth every 6 (six) hours as needed for nausea or vomiting. 30 tablet 1    risedronate (ACTONEL) 150 MG tablet Take 150 mg by mouth every 30 (thirty) days.     sertraline (ZOLOFT) 100 MG tablet Take 100 mg by mouth at bedtime.     sucralfate (CARAFATE) 1 g tablet Take 1 tablet (1 g total) by mouth 4 (four) times daily -  with meals and at bedtime. 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     No current facility-administered medications for this visit.   Facility-Administered Medications Ordered in Other Visits  Medication Dose Route Frequency Provider Last Rate Last Admin   heparin lock flush 100 unit/mL  500 Units Intracatheter Once PRN Truitt Merle, MD       magnesium sulfate IVPB 2 g 50 mL  2 g Intravenous Once Truitt Merle, MD 50 mL/hr at 11/15/22 0908 2 g at 11/15/22 0908   sodium chloride flush (NS) 0.9 % injection 10 mL  10 mL Intracatheter PRN Truitt Merle, MD        PHYSICAL EXAMINATION: ECOG PERFORMANCE STATUS: 2 - Symptomatic, <50% confined to bed  Vitals:   11/15/22 0836  BP: 97/61  Pulse: 74  Resp: 18  SpO2: 100%   Wt Readings from Last 3 Encounters:  11/15/22 104 lb 6.4 oz (47.4 kg)  11/05/22 105 lb 3.2 oz (47.7 kg)  10/29/22 108 lb 3.2 oz (49.1 kg)     GENERAL:alert, no distress and comfortable SKIN: skin color normal, no rashes or significant lesions EYES: normal, Conjunctiva are pink and non-injected, sclera clear  NEURO: alert & oriented x 3 with fluent speech   LABORATORY DATA:  I have reviewed the data as listed    Latest Ref Rng & Units 11/15/2022    7:42 AM 11/08/2022   11:36 AM 11/05/2022   12:44 PM  CBC  WBC 4.0 - 10.5 K/uL 1.6  1.9  2.9   Hemoglobin 12.0 - 15.0 g/dL 12.2  12.3  11.5   Hematocrit 36.0 - 46.0 % 37.6  36.4  34.0   Platelets 150 - 400 K/uL 171  41  37         Latest Ref Rng & Units 11/15/2022    7:42 AM 11/05/2022   12:44 PM 10/29/2022    1:06 PM  CMP  Glucose 70 - 99 mg/dL 110  89  93   BUN 8 - 23 mg/dL 8  9   15    Creatinine 0.44 - 1.00 mg/dL 0.40  0.36  0.68   Sodium 135 - 145 mmol/L 138  136  136   Potassium 3.5 - 5.1 mmol/L 4.4  3.9  3.8   Chloride 98 - 111 mmol/L 105  103  101   CO2 22 - 32 mmol/L 28  28  28    Calcium 8.9 - 10.3 mg/dL 9.2  9.3  9.4   Total Protein 6.5 - 8.1 g/dL 6.3  6.9  6.2   Total Bilirubin 0.3 - 1.2 mg/dL 0.4  0.5  0.6   Alkaline Phos 38 - 126 U/L 45  43  41   AST 15 - 41 U/L 13  14  14    ALT 0 - 44 U/L 9  10  17        RADIOGRAPHIC STUDIES: I have personally reviewed the radiological images as listed and agreed with the findings in the report. No results found.    Orders Placed This Encounter  Procedures   CBC with Differential ([facility] Only)    Standing Status:   Future    Standing Expiration Date:   12/07/2023   CMP ([facility] only)    Standing Status:   Future    Standing Expiration Date:   12/07/2023   Magnesium    Standing Status:   Future    Standing Expiration Date:   12/07/2023   All questions were answered. The patient knows to call the clinic with any problems, questions or concerns. No barriers to learning was detected. The total time spent in the appointment was 30 minutes.     Truitt Merle, MD 11/15/2022   Felicity Coyer, CMA, am acting as scribe for Truitt Merle, MD.   I have reviewed the above documentation for accuracy and completeness, and I agree with the above.

## 2022-11-16 ENCOUNTER — Inpatient Hospital Stay: Payer: Medicare Other

## 2022-11-16 VITALS — BP 115/67 | HR 66 | Temp 97.7°F | Resp 17

## 2022-11-16 DIAGNOSIS — Z79811 Long term (current) use of aromatase inhibitors: Secondary | ICD-10-CM | POA: Diagnosis not present

## 2022-11-16 DIAGNOSIS — Z5189 Encounter for other specified aftercare: Secondary | ICD-10-CM | POA: Diagnosis not present

## 2022-11-16 DIAGNOSIS — Z5111 Encounter for antineoplastic chemotherapy: Secondary | ICD-10-CM | POA: Diagnosis not present

## 2022-11-16 DIAGNOSIS — C778 Secondary and unspecified malignant neoplasm of lymph nodes of multiple regions: Secondary | ICD-10-CM | POA: Diagnosis not present

## 2022-11-16 DIAGNOSIS — Z452 Encounter for adjustment and management of vascular access device: Secondary | ICD-10-CM | POA: Diagnosis not present

## 2022-11-16 DIAGNOSIS — C3490 Malignant neoplasm of unspecified part of unspecified bronchus or lung: Secondary | ICD-10-CM

## 2022-11-16 DIAGNOSIS — C3412 Malignant neoplasm of upper lobe, left bronchus or lung: Secondary | ICD-10-CM | POA: Diagnosis not present

## 2022-11-16 DIAGNOSIS — Z170 Estrogen receptor positive status [ER+]: Secondary | ICD-10-CM | POA: Diagnosis not present

## 2022-11-16 DIAGNOSIS — C50212 Malignant neoplasm of upper-inner quadrant of left female breast: Secondary | ICD-10-CM | POA: Diagnosis not present

## 2022-11-16 MED ORDER — SODIUM CHLORIDE 0.9 % IV SOLN
10.0000 mg | Freq: Once | INTRAVENOUS | Status: AC
  Administered 2022-11-16: 10 mg via INTRAVENOUS
  Filled 2022-11-16: qty 10

## 2022-11-16 MED ORDER — SODIUM CHLORIDE 0.9 % IV SOLN
100.0000 mg/m2 | Freq: Once | INTRAVENOUS | Status: AC
  Administered 2022-11-16: 148 mg via INTRAVENOUS
  Filled 2022-11-16: qty 7.4

## 2022-11-16 MED ORDER — SODIUM CHLORIDE 0.9 % IV SOLN: Freq: Once | INTRAVENOUS | Status: AC

## 2022-11-16 MED FILL — Dexamethasone Sodium Phosphate Inj 100 MG/10ML: INTRAMUSCULAR | Qty: 1 | Status: AC

## 2022-11-16 NOTE — Patient Instructions (Signed)
Monica [name]  Discharge Instructions: Thank you for choosing [facility] to provide your oncology and hematology care.   If you have a lab appointment with the [facility], please go directly to the [facility] and check in at the registration area.   Wear comfortable clothing and clothing appropriate for easy access to any Portacath or PICC line.   We strive to give you quality time with your provider. You may need to reschedule your appointment if you arrive late (15 or more minutes).  Arriving late affects you and other patients whose appointments are after yours.  Also, if you miss three or more appointments without notifying the office, you may be dismissed from the clinic at the provider's discretion.      For prescription refill requests, have your pharmacy contact our office and allow 72 hours for refills to be completed.    Today you received the following chemotherapy and/or immunotherapy agents: Etoposide      To help prevent nausea and vomiting after your treatment, we encourage you to take your nausea medication as directed.  BELOW ARE SYMPTOMS THAT SHOULD BE REPORTED IMMEDIATELY: *FEVER GREATER THAN 100.4 F (38 C) OR HIGHER *CHILLS OR SWEATING *NAUSEA AND VOMITING THAT IS NOT CONTROLLED WITH YOUR NAUSEA MEDICATION *UNUSUAL SHORTNESS OF BREATH *UNUSUAL BRUISING OR BLEEDING *URINARY PROBLEMS (pain or burning when urinating, or frequent urination) *BOWEL PROBLEMS (unusual diarrhea, constipation, pain near the anus) TENDERNESS IN MOUTH AND THROAT WITH OR WITHOUT PRESENCE OF ULCERS (sore throat, sores in mouth, or a toothache) UNUSUAL RASH, SWELLING OR PAIN  UNUSUAL VAGINAL DISCHARGE OR ITCHING   Items with * indicate a potential emergency and should be followed up as soon as possible or go to the Emergency Department if any problems should occur.  Please show the CHEMOTHERAPY ALERT CARD or IMMUNOTHERAPY ALERT CARD at  check-in to the Emergency Department and triage nurse.  Should you have questions after your visit or need to cancel or reschedule your appointment, please contact [facility]  Dept: [phone]  and follow the prompts.  Office hours are 8:00 a.m. to 4:30 p.m. Monday - Friday. Please note that voicemails left after 4:00 p.m. may not be returned until the following business day.  We are closed weekends and major holidays. You have access to a nurse at all times for urgent questions. Please call the main number to the clinic Dept: [phone] and follow the prompts.   For any non-urgent questions, you may also contact your provider using MyChart. We now offer e-Visits for anyone 64 and older to request care online for non-urgent symptoms. For details visit [email or web address].   Also download the MyChart app! Go to the app store, search "MyChart", open the app, select [facility], and log in with your MyChart username and password.

## 2022-11-17 ENCOUNTER — Telehealth: Payer: Self-pay

## 2022-11-17 ENCOUNTER — Inpatient Hospital Stay: Payer: Medicare Other

## 2022-11-17 VITALS — BP 119/66 | HR 78 | Temp 98.0°F | Resp 18

## 2022-11-17 DIAGNOSIS — C3412 Malignant neoplasm of upper lobe, left bronchus or lung: Secondary | ICD-10-CM | POA: Diagnosis not present

## 2022-11-17 DIAGNOSIS — Z5189 Encounter for other specified aftercare: Secondary | ICD-10-CM | POA: Diagnosis not present

## 2022-11-17 DIAGNOSIS — C778 Secondary and unspecified malignant neoplasm of lymph nodes of multiple regions: Secondary | ICD-10-CM | POA: Diagnosis not present

## 2022-11-17 DIAGNOSIS — C3490 Malignant neoplasm of unspecified part of unspecified bronchus or lung: Secondary | ICD-10-CM

## 2022-11-17 DIAGNOSIS — Z79811 Long term (current) use of aromatase inhibitors: Secondary | ICD-10-CM | POA: Diagnosis not present

## 2022-11-17 DIAGNOSIS — Z170 Estrogen receptor positive status [ER+]: Secondary | ICD-10-CM | POA: Diagnosis not present

## 2022-11-17 DIAGNOSIS — C50212 Malignant neoplasm of upper-inner quadrant of left female breast: Secondary | ICD-10-CM | POA: Diagnosis not present

## 2022-11-17 DIAGNOSIS — Z5111 Encounter for antineoplastic chemotherapy: Secondary | ICD-10-CM | POA: Diagnosis not present

## 2022-11-17 DIAGNOSIS — Z452 Encounter for adjustment and management of vascular access device: Secondary | ICD-10-CM | POA: Diagnosis not present

## 2022-11-17 MED ORDER — SODIUM CHLORIDE 0.9% FLUSH
10.0000 mL | INTRAVENOUS | Status: DC | PRN
  Administered 2022-11-17: 10 mL

## 2022-11-17 MED ORDER — HEPARIN SOD (PORK) LOCK FLUSH 100 UNIT/ML IV SOLN
500.0000 [IU] | Freq: Once | INTRAVENOUS | Status: AC | PRN
  Administered 2022-11-17: 500 [IU]

## 2022-11-17 MED ORDER — SODIUM CHLORIDE 0.9 % IV SOLN
10.0000 mg | Freq: Once | INTRAVENOUS | Status: AC
  Administered 2022-11-17: 10 mg via INTRAVENOUS
  Filled 2022-11-17: qty 10

## 2022-11-17 MED ORDER — SODIUM CHLORIDE 0.9 % IV SOLN: Freq: Once | INTRAVENOUS | Status: AC

## 2022-11-17 MED ORDER — SODIUM CHLORIDE 0.9 % IV SOLN
100.0000 mg/m2 | Freq: Once | INTRAVENOUS | Status: AC
  Administered 2022-11-17: 148 mg via INTRAVENOUS
  Filled 2022-11-17: qty 7.4

## 2022-11-17 NOTE — Telephone Encounter (Signed)
Opened by mistake.

## 2022-11-17 NOTE — Patient Instructions (Signed)
[facility]  Discharge Instructions: Thank you for choosing [facility] to provide your oncology and hematology care.   If you have a lab appointment with the [facility], please go directly to the [facility] and check in at the registration area.   Wear comfortable clothing and clothing appropriate for easy access to any Portacath or PICC line.   We strive to give you quality time with your provider. You may need to reschedule your appointment if you arrive late (15 or more minutes).  Arriving late affects you and other patients whose appointments are after yours.  Also, if you miss three or more appointments without notifying the office, you may be dismissed from the clinic at the provider's discretion.      For prescription refill requests, have your pharmacy contact our office and allow 72 hours for refills to be completed.    Today you received the following chemotherapy and/or immunotherapy agents: Etoposide      To help prevent nausea and vomiting after your treatment, we encourage you to take your nausea medication as directed.  BELOW ARE SYMPTOMS THAT SHOULD BE REPORTED IMMEDIATELY: *FEVER GREATER THAN 100.4 F (38 C) OR HIGHER *CHILLS OR SWEATING *NAUSEA AND VOMITING THAT IS NOT CONTROLLED WITH YOUR NAUSEA MEDICATION *UNUSUAL SHORTNESS OF BREATH *UNUSUAL BRUISING OR BLEEDING *URINARY PROBLEMS (pain or burning when urinating, or frequent urination) *BOWEL PROBLEMS (unusual diarrhea, constipation, pain near the anus) TENDERNESS IN MOUTH AND THROAT WITH OR WITHOUT PRESENCE OF ULCERS (sore throat, sores in mouth, or a toothache) UNUSUAL RASH, SWELLING OR PAIN  UNUSUAL VAGINAL DISCHARGE OR ITCHING   Items with * indicate a potential emergency and should be followed up as soon as possible or go to the Emergency Department if any problems should occur.  Please show the CHEMOTHERAPY ALERT CARD or IMMUNOTHERAPY ALERT CARD at  check-in to the Emergency Department and triage nurse.  Should you have questions after your visit or need to cancel or reschedule your appointment, please contact [facility]  Dept: [phone]  and follow the prompts.  Office hours are 8:00 a.m. to 4:30 p.m. Monday - Friday. Please note that voicemails left after 4:00 p.m. may not be returned until the following business day.  We are closed weekends and major holidays. You have access to a nurse at all times for urgent questions. Please call the main number to the clinic Dept: [phone] and follow the prompts.   For any non-urgent questions, you may also contact your provider using MyChart. We now offer e-Visits for anyone 4 and older to request care online for non-urgent symptoms. For details visit [email or web address].   Also download the MyChart app! Go to the app store, search "MyChart", open the app, select [facility], and log in with your MyChart username and password.

## 2022-11-19 ENCOUNTER — Other Ambulatory Visit: Payer: Self-pay

## 2022-11-19 ENCOUNTER — Inpatient Hospital Stay: Payer: Medicare Other

## 2022-11-19 VITALS — BP 118/67 | HR 76 | Temp 97.8°F | Resp 16

## 2022-11-19 DIAGNOSIS — Z5189 Encounter for other specified aftercare: Secondary | ICD-10-CM | POA: Diagnosis not present

## 2022-11-19 DIAGNOSIS — C778 Secondary and unspecified malignant neoplasm of lymph nodes of multiple regions: Secondary | ICD-10-CM | POA: Diagnosis not present

## 2022-11-19 DIAGNOSIS — Z452 Encounter for adjustment and management of vascular access device: Secondary | ICD-10-CM | POA: Diagnosis not present

## 2022-11-19 DIAGNOSIS — Z5111 Encounter for antineoplastic chemotherapy: Secondary | ICD-10-CM | POA: Diagnosis not present

## 2022-11-19 DIAGNOSIS — C3412 Malignant neoplasm of upper lobe, left bronchus or lung: Secondary | ICD-10-CM | POA: Diagnosis not present

## 2022-11-19 DIAGNOSIS — Z79811 Long term (current) use of aromatase inhibitors: Secondary | ICD-10-CM | POA: Diagnosis not present

## 2022-11-19 DIAGNOSIS — Z170 Estrogen receptor positive status [ER+]: Secondary | ICD-10-CM | POA: Diagnosis not present

## 2022-11-19 DIAGNOSIS — C3490 Malignant neoplasm of unspecified part of unspecified bronchus or lung: Secondary | ICD-10-CM

## 2022-11-19 DIAGNOSIS — C50212 Malignant neoplasm of upper-inner quadrant of left female breast: Secondary | ICD-10-CM | POA: Diagnosis not present

## 2022-11-19 MED ORDER — PEGFILGRASTIM-CBQV 6 MG/0.6ML ~~LOC~~ SOSY
6.0000 mg | PREFILLED_SYRINGE | Freq: Once | SUBCUTANEOUS | Status: AC
  Administered 2022-11-19: 6 mg via SUBCUTANEOUS
  Filled 2022-11-19: qty 0.6

## 2022-11-19 NOTE — Patient Instructions (Signed)

## 2022-11-24 ENCOUNTER — Encounter: Payer: Self-pay | Admitting: Hematology

## 2022-11-24 DIAGNOSIS — H2511 Age-related nuclear cataract, right eye: Secondary | ICD-10-CM | POA: Diagnosis not present

## 2022-11-25 ENCOUNTER — Inpatient Hospital Stay: Payer: Medicare Other

## 2022-11-25 ENCOUNTER — Other Ambulatory Visit: Payer: Self-pay

## 2022-11-25 DIAGNOSIS — C778 Secondary and unspecified malignant neoplasm of lymph nodes of multiple regions: Secondary | ICD-10-CM | POA: Diagnosis not present

## 2022-11-25 DIAGNOSIS — C50212 Malignant neoplasm of upper-inner quadrant of left female breast: Secondary | ICD-10-CM | POA: Diagnosis not present

## 2022-11-25 DIAGNOSIS — C3490 Malignant neoplasm of unspecified part of unspecified bronchus or lung: Secondary | ICD-10-CM

## 2022-11-25 DIAGNOSIS — C3412 Malignant neoplasm of upper lobe, left bronchus or lung: Secondary | ICD-10-CM | POA: Diagnosis not present

## 2022-11-25 DIAGNOSIS — D751 Secondary polycythemia: Secondary | ICD-10-CM

## 2022-11-25 DIAGNOSIS — Z5111 Encounter for antineoplastic chemotherapy: Secondary | ICD-10-CM | POA: Diagnosis not present

## 2022-11-25 DIAGNOSIS — Z170 Estrogen receptor positive status [ER+]: Secondary | ICD-10-CM | POA: Diagnosis not present

## 2022-11-25 DIAGNOSIS — Z95828 Presence of other vascular implants and grafts: Secondary | ICD-10-CM

## 2022-11-25 DIAGNOSIS — Z79811 Long term (current) use of aromatase inhibitors: Secondary | ICD-10-CM | POA: Diagnosis not present

## 2022-11-25 DIAGNOSIS — Z452 Encounter for adjustment and management of vascular access device: Secondary | ICD-10-CM | POA: Diagnosis not present

## 2022-11-25 DIAGNOSIS — Z5189 Encounter for other specified aftercare: Secondary | ICD-10-CM | POA: Diagnosis not present

## 2022-11-25 LAB — CMP (CANCER CENTER ONLY)
ALT: 10 U/L (ref 0–44)
AST: 9 U/L — ABNORMAL LOW (ref 15–41)
Albumin: 4 g/dL (ref 3.5–5.0)
Alkaline Phosphatase: 66 U/L (ref 38–126)
Anion gap: 9 (ref 5–15)
BUN: 11 mg/dL (ref 8–23)
CO2: 26 mmol/L (ref 22–32)
Calcium: 9 mg/dL (ref 8.9–10.3)
Chloride: 102 mmol/L (ref 98–111)
Creatinine: 0.39 mg/dL — ABNORMAL LOW (ref 0.44–1.00)
GFR, Estimated: >60 mL/min (ref >60)
Glucose, Bld: 95 mg/dL (ref 70–99)
Potassium: 3.7 mmol/L (ref 3.5–5.1)
Sodium: 137 mmol/L (ref 135–145)
Total Bilirubin: 0.4 mg/dL (ref 0.3–1.2)
Total Protein: 6.3 g/dL — ABNORMAL LOW (ref 6.5–8.1)

## 2022-11-25 LAB — CBC WITH DIFFERENTIAL (CANCER CENTER ONLY)
Abs Immature Granulocytes: 0.07 10*3/uL (ref 0.00–0.07)
Basophils Absolute: 0.1 10*3/uL (ref 0.0–0.1)
Basophils Relative: 2 %
Eosinophils Absolute: 0.1 10*3/uL (ref 0.0–0.5)
Eosinophils Relative: 1 %
HCT: 32.1 % — ABNORMAL LOW (ref 36.0–46.0)
Hemoglobin: 10.7 g/dL — ABNORMAL LOW (ref 12.0–15.0)
Immature Granulocytes: 2 %
Lymphocytes Relative: 10 %
Lymphs Abs: 0.4 10*3/uL — ABNORMAL LOW (ref 0.7–4.0)
MCH: 25.7 pg — ABNORMAL LOW (ref 26.0–34.0)
MCHC: 33.3 g/dL (ref 30.0–36.0)
MCV: 77.2 fL — ABNORMAL LOW (ref 80.0–100.0)
Monocytes Absolute: 0.6 10*3/uL (ref 0.1–1.0)
Monocytes Relative: 16 %
Neutro Abs: 2.6 10*3/uL (ref 1.7–7.7)
Neutrophils Relative %: 69 %
Platelet Count: 40 10*3/uL — ABNORMAL LOW (ref 150–400)
RBC: 4.16 MIL/uL (ref 3.87–5.11)
RDW: 20.1 % — ABNORMAL HIGH (ref 11.5–15.5)
WBC Count: 3.8 10*3/uL — ABNORMAL LOW (ref 4.0–10.5)
nRBC: 0 % (ref 0.0–0.2)

## 2022-11-25 LAB — FERRITIN: Ferritin: 145 ng/mL (ref 11–307)

## 2022-11-25 MED ORDER — SODIUM CHLORIDE 0.9% FLUSH
10.0000 mL | Freq: Once | INTRAVENOUS | Status: AC
  Administered 2022-11-25: 10 mL

## 2022-11-25 MED ORDER — HEPARIN SOD (PORK) LOCK FLUSH 100 UNIT/ML IV SOLN
500.0000 [IU] | Freq: Once | INTRAVENOUS | Status: AC
  Administered 2022-11-25: 500 [IU]

## 2022-12-01 ENCOUNTER — Inpatient Hospital Stay: Payer: Medicare Other

## 2022-12-01 ENCOUNTER — Other Ambulatory Visit: Payer: Medicare Other

## 2022-12-02 ENCOUNTER — Inpatient Hospital Stay: Payer: Medicare Other

## 2022-12-03 ENCOUNTER — Inpatient Hospital Stay: Payer: Medicare Other | Admitting: Hematology

## 2022-12-03 ENCOUNTER — Inpatient Hospital Stay: Payer: Medicare Other

## 2022-12-03 MED FILL — Fosaprepitant Dimeglumine For IV Infusion 150 MG (Base Eq): INTRAVENOUS | Qty: 5 | Status: AC

## 2022-12-03 MED FILL — Dexamethasone Sodium Phosphate Inj 100 MG/10ML: INTRAMUSCULAR | Qty: 1 | Status: AC

## 2022-12-03 NOTE — Progress Notes (Unsigned)
Monica [name]   [phone] [phone]   Clinic Follow up Note   Patient Care Team: Merrilee Seashore, MD as PCP - General (Internal Medicine) Minus Breeding, MD as PCP - Cardiology (Cardiology) [name], MD as Consulting Physician (General Surgery) Truitt Merle, MD as Consulting Physician (Hematology) Kyung Rudd, MD as Consulting Physician (Radiation Oncology) Gardenia Phlegm, NP as Nurse Practitioner (Hematology and Oncology)  Date of Service:  12/06/2022  CHIEF COMPLAINT: f/u of small cell lung cancer   CURRENT THERAPY:  cisplatin and etoposide every 3 weeks   ASSESSMENT:  Monica [name] is a 77 y.o. 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 Brain MRI negative  -started concurrent chemo cisplatin/etoposide and radiation 09/29/2022, completed RT on 11/15/2022 -she has recovered reasonably well from radiation, will proceed to cycle 4 chemotherapy today.  Weight loss and malnutrition -She has lost 20 to 30 pounds since she started chemo and radiation -We again discussed nutritional supplement, such as Ensure or boost -She will monitor her weight at home.    PLAN: -recommend adding more calories to her diet -Repeat CT  scan in 3 months -lab reviewed  -continue Anastrozole -proceed with C4  cisplatin/ etoposide today and next two days with GCSF on day 5  -lab,f/u in 6 weeks    SUMMARY OF ONCOLOGIC HISTORY: Oncology History Overview Note  Cancer Staging Malignant neoplasm of upper-inner quadrant of left breast in female, estrogen  receptor positive ([place]) Staging form: Breast, AJCC 8th Edition - Clinical stage from 02/16/2017: Stage IA (cT1b, cN0, cM0, G2, ER: Positive, PR: Positive, HER2: Negative) - Unsigned Staging comments: Staged at breast conference  - Pathologic stage from 03/16/2017: Stage IA (pT1b, pN0, cM0, G2, ER: Positive, PR: Positive, HER2: Negative, Oncotype DX score: 27) - Signed by Truitt Merle, MD on 04/24/2017     Malignant neoplasm of upper-inner quadrant of left breast in female, estrogen receptor positive ([place])  02/09/2017 Initial Diagnosis   Malignant neoplasm of upper-inner quadrant of left breast in female, estrogen receptor positive ([place])   02/09/2017 Initial Biopsy   Diagnosis Breast, left, needle core biopsy - INVASIVE DUCTAL CARCINOMA, 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 Dalbert Batman    03/16/2017 Pathology Results   PATHOLOGY REPORT Diagnosis 03/16/17 1. Breast, lumpectomy, Left - INVASIVE DUCTAL CARCINOMA, NOTTINGHAM GRADE 2 OF 3, 0.9 CM - DUCTAL CARCINOMA IN SITU - MARGINS UNINVOLVED BY CARCINOMA (0.3 CM POSTERIOR MARGIN) - PREVIOUS BIOPSY SITE CHANGES - SEE ONCOLOGY TABLE BELOW 2. Lymph node, sentinel, biopsy, Left axillary #1 - NO CARCINOMA IDENTIFIED IN ONE LYMPH NODE (0/1) 3. Lymph node, sentinel, biopsy, Left axillary #2 - NO CARCINOMA IDENTIFIED IN ONE LYMPH NODE (0/1) 4. Lymph node, sentinel, biopsy, Left axillary #3 - NO CARCINOMA IDENTIFIED IN ONE LYMPH NODE (0/1) 5. Lymph node, sentinel, biopsy, Left axillary #4 - NO  CARCINOMA IDENTIFIED IN ONE LYMPH NODE (0/1)    03/16/2017 Oncotype testing   Recurrance score of 27 with a 18% distance recurrance  in the net 10 years with Tamoxifen alone    05/19/2017 - 06/16/2017 Radiation Therapy   Radiation with Dr. Lisbeth Renshaw   06/2017 -  Anti-estrogen oral therapy   anastrozole 1 mg once a day starting late 06/2017     04/29/2020 Pathology Results   DIAGNOSIS:   BONE MARROW, ASPIRATE, CLOT, CORE:  -  Cellular marrow with trilineage hematopoiesis  -  No morphologic evidence of carcinoma or hematopoietic neoplasm  -  See microscopic description below   PERIPHERAL BLOOD:  -  Polycythemia and thrombocytopenia  -  See complete blood cell count   MICROSCOPIC DESCRIPTION:   PERIPHERAL BLOOD SMEAR: The peripheral blood has a polycythemia and  thrombocytopenia.  Leukocytes are morphologically unremarkable.   BONE MARROW ASPIRATE: Spicular, cellular and adequate for evaluation  Erythroid precursors: Orderly maturation without overt dysplasia  Granulocytic precursors: Orderly maturation without overt dysplasia  Megakaryocytes: Qualitatively and quantitatively unremarkable  Lymphocytes/plasma cells: No lymphocytosis or plasmacytosis    Small cell lung cancer ([place])  09/14/2022 Cancer Staging   Staging form: Lung, AJCC 8th Edition - Clinical stage from 09/14/2022: Stage IIIB (cT2, cN3, cM0) - Signed by Truitt Merle, MD on 09/16/2022   09/16/2022 Initial Diagnosis   Small cell lung cancer ([place])   09/29/2022 -  Chemotherapy   Patient is on Treatment Plan : LUNG SMALL CELL Cisplatin (80) D1 + Etoposide (100) D1-3 q21d        INTERVAL HISTORY:  Monica [name] is here for a follow up of small cell lung cancer  She was last seen by me on 11/15/2022 She presents to the clinic accompanied by daughter. Pt reports when she has coughed up a little blood in her phlegm. Pt states that it has stopped. Pt now states its yellow, but only in the morning. Pt states she has lost some weight. Pt reports her energy level is so so, she state she is able to take care of herself. Pt denies sitting around all day. Pt denies having  pain.        All other systems were reviewed with the patient and are negative.  MEDICAL HISTORY:  Past Medical History:  Diagnosis Date   CAD (coronary artery disease)    Cypher stent 09/2002   Cancer ([place]) 2018   left breast, radiation therapy   CHF (congestive heart failure) ([place])    Depression    Diabetes mellitus ([place])    Hyperlipidemia    Hypertension    Myocardial infarction ([place])    2003,went into cardiac shock   Osteoporosis    Seizures ([place])    1st seizure age late 45's; most recent 07/28/17   Sinusitis     SURGICAL HISTORY: Past Surgical History:  Procedure Laterality Date   ABDOMINAL HYSTERECTOMY     APPENDECTOMY     BREAST LUMPECTOMY WITH RADIOACTIVE SEED AND SENTINEL LYMPH NODE BIOPSY Left 03/16/2017   Procedure: INJECT BLUE DYE LEFT BREAST, LEFT BREAST LUMPECTOMY WITH RADIOACTIVE SEED AND LEFT AXILLARY DEEP SENTINEL LYMPH NODE  BIOPSY ADJACENT TISSUE TRANSFER;  Surgeon: [name], MD;  Location: [facility];  Service: General;  Laterality: Left;   CARPAL TUNNEL RELEASE Bilateral    COLONOSCOPY WITH PROPOFOL N/A 05/30/2015   Procedure: COLONOSCOPY WITH PROPOFOL;  Surgeon: Carol Ada, MD;  Location: [facility];  Service: Endoscopy;  Laterality: N/A;   COLONOSCOPY WITH  PROPOFOL N/A 06/09/2018   Procedure: COLONOSCOPY WITH PROPOFOL;  Surgeon: Carol Ada, MD;  Location: [facility];  Service: Endoscopy;  Laterality: N/A;   COLONOSCOPY WITH PROPOFOL N/A 07/31/2021   Procedure: COLONOSCOPY WITH PROPOFOL;  Surgeon: Carol Ada, MD;  Location: [facility];  Service: Endoscopy;  Laterality: N/A;   CORONARY ANGIOPLASTY WITH STENT PLACEMENT  10-02-2002   ESOPHAGOGASTRODUODENOSCOPY (EGD) WITH PROPOFOL N/A 07/31/2021   Procedure: ESOPHAGOGASTRODUODENOSCOPY (EGD) WITH PROPOFOL;  Surgeon: Carol Ada, MD;  Location: [facility];  Service: Endoscopy;  Laterality: N/A;   Exploratory abdominal     Bleeding in abdomen after tubal   IR IMAGING GUIDED PORT INSERTION   09/28/2022   POLYPECTOMY  06/09/2018   Procedure: POLYPECTOMY;  Surgeon: Carol Ada, MD;  Location: [facility];  Service: Endoscopy;;   POLYPECTOMY  07/31/2021   Procedure: POLYPECTOMY;  Surgeon: Carol Ada, MD;  Location: [facility];  Service: Endoscopy;;   TUBAL LIGATION     UMBILICAL HERNIA REPAIR      I have reviewed the social history and family history with the patient and they are unchanged from previous note.  ALLERGIES:  is allergic to ace inhibitors, keflex [cephalexin], phenobarbital, sulfate, altace [ramipril], codeine, and dicyclomine hcl.  MEDICATIONS:  Current Outpatient Medications  Medication Sig Dispense Refill   acetaminophen (TYLENOL) 500 MG tablet Take 1,000 mg by mouth every 6 (six) hours as needed for moderate pain or headache.     albuterol (VENTOLIN HFA) 108 (90 Base) MCG/ACT inhaler INHALE 2 PUFFS BY MOUTH EVERY 6 HOURS AS NEEDED FOR WHEEZING OR  SHORTHNESS  OF  BREATH 9 g 11   anastrozole (ARIMIDEX) 1 MG tablet Take 1 tablet (1 mg total) by mouth daily. 90 tablet 1   aspirin EC 81 MG tablet Take 81 mg by mouth at bedtime.     atorvastatin (LIPITOR) 40 MG tablet Take 40 mg by mouth at bedtime.     B-D ULTRAFINE III SHORT PEN 31G X 8 MM MISC Inject into the skin as directed.     Calcium Carbonate-Vitamin D (CALTRATE 600+D PO) Take 1 tablet by mouth in the morning and at bedtime.     carvedilol (COREG) 3.125 MG tablet Take 3.125 mg by mouth 2 (two) times daily.      cetirizine (ZYRTEC) 10 MG tablet Take 10 mg by mouth daily as needed for allergies.     clonazePAM (KLONOPIN) 1 MG tablet Take 1 mg by mouth in the morning and at bedtime.     Fluticasone-Umeclidin-Vilant (TRELEGY ELLIPTA) 100-62.5-25 MCG/ACT AEPB INHALE 1 PUFF ONCE DAILY RINSE  MOUTH  AFTER 60 each 11   Insulin Detemir (LEVEMIR) 100 UNIT/ML Pen Inject 17-25 Units into the skin See admin instructions. Inject 25 units subcutaneously in the morning and inject 17 units subcutaneously at night      insulin lispro (HUMALOG) 100 UNIT/ML KwikPen Inject 7-9 Units into the skin See admin instructions. Inject 7 units in the morning, 7 units at lunch, and 9 units at night     ketorolac (ACULAR) 0.5 % ophthalmic solution Place 1 drop into the left eye 4 (four) times daily.     levETIRAcetam (KEPPRA) 500 MG tablet Take 1 tablet (500 mg total) by mouth 2 (two) times daily. 180 tablet 3   lidocaine-prilocaine (EMLA) cream Apply to affected area once 30 g 3   losartan (COZAAR) 50 MG tablet Take 25 mg by mouth at bedtime.     magnesium oxide (MAG-OX) 400 (240 Mg) MG tablet Take 1  tablet (400 mg total) by mouth daily. 60 tablet 1   moxifloxacin (VIGAMOX) 0.5 % ophthalmic solution Place 1 drop into the left eye 4 (four) times daily.     nitroGLYCERIN (NITROSTAT) 0.4 MG SL tablet Place 1 tablet (0.4 mg total) under the tongue every 5 (five) minutes as needed for chest pain. 25 tablet 2   Omega-3 Fatty Acids (FISH OIL) 1000 MG CAPS Take 1,000 mg by mouth in the morning and at bedtime.     omeprazole (PRILOSEC) 20 MG capsule Take 20 mg by mouth in the morning.     ondansetron (ZOFRAN) 8 MG tablet Take 1 tablet (8 mg total) by mouth every 8 (eight) hours as needed for nausea or vomiting. Start on the third day after cisplatin. 30 tablet 1   ONETOUCH VERIO test strip USE THREE TIMES A DAY AND AS NEEDED FOR HYPOGLYCEMIA     OXYGEN Inhale 2 L into the lungs at bedtime.     pioglitazone (ACTOS) 15 MG tablet Take 15 mg by mouth at bedtime.     Polyethyl Glycol-Propyl Glycol 0.4-0.3 % SOLN Place 1 drop into both eyes 2 (two) times daily as needed (itchy/irritated eyes.).     prednisoLONE acetate (PRED FORTE) 1 % ophthalmic suspension Place 1 drop into the left eye 4 (four) times daily.     prochlorperazine (COMPAZINE) 10 MG tablet Take 1 tablet (10 mg total) by mouth every 6 (six) hours as needed for nausea or vomiting. 30 tablet 1   risedronate (ACTONEL) 150 MG tablet Take 150 mg by mouth every 30 (thirty) days.      sertraline (ZOLOFT) 100 MG tablet Take 100 mg by mouth at bedtime.     sucralfate (CARAFATE) 1 g tablet Take 1 tablet (1 g total) by mouth 4 (four) times daily -  with meals and at bedtime. 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     No current facility-administered medications for this visit.    PHYSICAL EXAMINATION: ECOG PERFORMANCE STATUS: 1 - Symptomatic but completely ambulatory  Vitals:   12/06/22 0824  BP: 101/62  Pulse: 76  Resp: 16  Temp: 97.6 F (36.4 C)  SpO2: 97%   Wt Readings from Last 3 Encounters:  12/06/22 97 lb 14.4 oz (44.4 kg)  11/15/22 104 lb 6.4 oz (47.4 kg)  11/05/22 105 lb 3.2 oz (47.7 kg)     GENERAL:alert, no distress and comfortable SKIN: skin color normal, no rashes or significant lesions EYES: normal, Conjunctiva are pink and non-injected, sclera clear  NEURO: alert & oriented x 3 with fluent speech   LABORATORY DATA:  I have reviewed the data as listed    Latest Ref Rng & Units 12/06/2022    7:52 AM 11/25/2022   11:17 AM 11/15/2022    7:42 AM  CBC  WBC 4.0 - 10.5 K/uL 8.3  3.8  1.6   Hemoglobin 12.0 - 15.0 g/dL 10.8  10.7  12.2   Hematocrit 36.0 - 46.0 % 33.6  32.1  37.6   Platelets 150 - 400 K/uL 196  40  171         Latest Ref Rng & Units 11/25/2022   11:17 AM 11/15/2022    7:42 AM 11/05/2022   12:44 PM  CMP  Glucose 70 - 99 mg/dL 95  110  89   BUN 8 - 23 mg/dL 11  8  9   Creatinine 0.44 - 1.00 mg/dL 0.39  0.40  0.36   Sodium 135 - 145 mmol/L 137  138  136   Potassium 3.5 - 5.1 mmol/L 3.7  4.4  3.9   Chloride 98 - 111 mmol/L 102  105  103   CO2 22 - 32 mmol/L '26  28  28   '$ Calcium 8.9 - 10.3 mg/dL 9.0  9.2  9.3   Total Protein 6.5 - 8.1 g/dL 6.3  6.3  6.9   Total Bilirubin 0.3 - 1.2 mg/dL 0.4  0.4  0.5   Alkaline Phos 38 - 126 U/L 66  45  43   AST 15 - 41 U/L '9  13  14   '$ ALT 0 - 44 U/L '10  9  10       '$ RADIOGRAPHIC  STUDIES: I have personally reviewed the radiological images as listed and agreed with the findings in the report. No results found.    No orders of the defined types were placed in this encounter.  All questions were answered. The patient knows to call the clinic with any problems, questions or concerns. No barriers to learning was detected. The total time spent in the appointment was 30 minutes.     Truitt Merle, MD 12/06/2022   Felicity Coyer, CMA, am acting as scribe for Truitt Merle, MD.   I have reviewed the above documentation for accuracy and completeness, and I agree with the above.

## 2022-12-05 NOTE — Assessment & Plan Note (Signed)
-  [id number], stage III, limited stage  -found on screening CT chest on July 22, 2022 which showed suprahilar left upper lobe pulmonary mass and mediastinal adenopathy -PET scan from August 08, 2022 showed hypermetabolic left hilar lung mass with mediastinal and left supraclavicular nodal metastasis. 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 Brain MRI negative  -started concurrent chemo cisplatin/etoposide and radiation 09/29/2022, completed RT on 11/15/2022 -she has recovered reasonably well from radiation, will proceed to cycle 3 chemotherapy today.

## 2022-12-06 ENCOUNTER — Other Ambulatory Visit: Payer: Self-pay

## 2022-12-06 ENCOUNTER — Inpatient Hospital Stay (HOSPITAL_BASED_OUTPATIENT_CLINIC_OR_DEPARTMENT_OTHER): Payer: Medicare Other

## 2022-12-06 ENCOUNTER — Inpatient Hospital Stay: Payer: Medicare Other

## 2022-12-06 ENCOUNTER — Encounter: Payer: Self-pay | Admitting: Hematology

## 2022-12-06 ENCOUNTER — Inpatient Hospital Stay: Payer: Medicare Other | Admitting: Hematology

## 2022-12-06 VITALS — BP 101/62 | HR 76 | Temp 97.6°F | Resp 16 | Ht 65.0 in | Wt 97.9 lb

## 2022-12-06 DIAGNOSIS — C3490 Malignant neoplasm of unspecified part of unspecified bronchus or lung: Secondary | ICD-10-CM

## 2022-12-06 DIAGNOSIS — D751 Secondary polycythemia: Secondary | ICD-10-CM

## 2022-12-06 DIAGNOSIS — Z170 Estrogen receptor positive status [ER+]: Secondary | ICD-10-CM

## 2022-12-06 DIAGNOSIS — C3412 Malignant neoplasm of upper lobe, left bronchus or lung: Secondary | ICD-10-CM | POA: Diagnosis not present

## 2022-12-06 DIAGNOSIS — Z95828 Presence of other vascular implants and grafts: Secondary | ICD-10-CM

## 2022-12-06 DIAGNOSIS — Z452 Encounter for adjustment and management of vascular access device: Secondary | ICD-10-CM | POA: Diagnosis not present

## 2022-12-06 DIAGNOSIS — Z79811 Long term (current) use of aromatase inhibitors: Secondary | ICD-10-CM | POA: Diagnosis not present

## 2022-12-06 DIAGNOSIS — Z5111 Encounter for antineoplastic chemotherapy: Secondary | ICD-10-CM | POA: Diagnosis not present

## 2022-12-06 DIAGNOSIS — C778 Secondary and unspecified malignant neoplasm of lymph nodes of multiple regions: Secondary | ICD-10-CM | POA: Diagnosis not present

## 2022-12-06 DIAGNOSIS — C50212 Malignant neoplasm of upper-inner quadrant of left female breast: Secondary | ICD-10-CM | POA: Diagnosis not present

## 2022-12-06 DIAGNOSIS — Z5189 Encounter for other specified aftercare: Secondary | ICD-10-CM | POA: Diagnosis not present

## 2022-12-06 LAB — CBC WITH DIFFERENTIAL (CANCER CENTER ONLY)
Abs Immature Granulocytes: 0.12 10*3/uL — ABNORMAL HIGH (ref 0.00–0.07)
Basophils Absolute: 0 10*3/uL (ref 0.0–0.1)
Basophils Relative: 1 %
Eosinophils Absolute: 0.1 10*3/uL (ref 0.0–0.5)
Eosinophils Relative: 1 %
HCT: 33.6 % — ABNORMAL LOW (ref 36.0–46.0)
Hemoglobin: 10.8 g/dL — ABNORMAL LOW (ref 12.0–15.0)
Immature Granulocytes: 1 %
Lymphocytes Relative: 5 %
Lymphs Abs: 0.4 10*3/uL — ABNORMAL LOW (ref 0.7–4.0)
MCH: 25.7 pg — ABNORMAL LOW (ref 26.0–34.0)
MCHC: 32.1 g/dL (ref 30.0–36.0)
MCV: 79.8 fL — ABNORMAL LOW (ref 80.0–100.0)
Monocytes Absolute: 0.6 10*3/uL (ref 0.1–1.0)
Monocytes Relative: 7 %
Neutro Abs: 7.2 10*3/uL (ref 1.7–7.7)
Neutrophils Relative %: 85 %
Platelet Count: 196 10*3/uL (ref 150–400)
RBC: 4.21 MIL/uL (ref 3.87–5.11)
RDW: 21.8 % — ABNORMAL HIGH (ref 11.5–15.5)
WBC Count: 8.3 10*3/uL (ref 4.0–10.5)
nRBC: 0 % (ref 0.0–0.2)

## 2022-12-06 LAB — CMP (CANCER CENTER ONLY)
ALT: 10 U/L (ref 0–44)
AST: 13 U/L — ABNORMAL LOW (ref 15–41)
Albumin: 3.7 g/dL (ref 3.5–5.0)
Alkaline Phosphatase: 59 U/L (ref 38–126)
Anion gap: 7 (ref 5–15)
BUN: 7 mg/dL — ABNORMAL LOW (ref 8–23)
CO2: 27 mmol/L (ref 22–32)
Calcium: 8.5 mg/dL — ABNORMAL LOW (ref 8.9–10.3)
Chloride: 104 mmol/L (ref 98–111)
Creatinine: 0.33 mg/dL — ABNORMAL LOW (ref 0.44–1.00)
GFR, Estimated: >60 mL/min (ref >60)
Glucose, Bld: 49 mg/dL — ABNORMAL LOW (ref 70–99)
Potassium: 3.8 mmol/L (ref 3.5–5.1)
Sodium: 138 mmol/L (ref 135–145)
Total Bilirubin: 0.3 mg/dL (ref 0.3–1.2)
Total Protein: 6.3 g/dL — ABNORMAL LOW (ref 6.5–8.1)

## 2022-12-06 LAB — SAMPLE TO BLOOD BANK

## 2022-12-06 LAB — MAGNESIUM: Magnesium: 1.8 mg/dL (ref 1.7–2.4)

## 2022-12-06 LAB — FERRITIN: Ferritin: 73 ng/mL (ref 11–307)

## 2022-12-06 LAB — GLUCOSE, CAPILLARY: Glucose-Capillary: 123 mg/dL — ABNORMAL HIGH (ref 70–99)

## 2022-12-06 MED ORDER — SODIUM CHLORIDE 0.9 % IV SOLN
150.0000 mg | Freq: Once | INTRAVENOUS | Status: AC
  Administered 2022-12-06: 150 mg via INTRAVENOUS
  Filled 2022-12-06: qty 150

## 2022-12-06 MED ORDER — SODIUM CHLORIDE 0.9 % IV SOLN
10.0000 mg | Freq: Once | INTRAVENOUS | Status: AC
  Administered 2022-12-06: 10 mg via INTRAVENOUS
  Filled 2022-12-06: qty 10

## 2022-12-06 MED ORDER — SODIUM CHLORIDE 0.9% FLUSH
10.0000 mL | Freq: Once | INTRAVENOUS | Status: AC
  Administered 2022-12-06: 10 mL

## 2022-12-06 MED ORDER — SODIUM CHLORIDE 0.9% FLUSH
10.0000 mL | INTRAVENOUS | Status: DC | PRN
  Administered 2022-12-06: 10 mL

## 2022-12-06 MED ORDER — POTASSIUM CHLORIDE IN NACL 20-0.9 MEQ/L-% IV SOLN
Freq: Once | INTRAVENOUS | Status: AC
  Filled 2022-12-06: qty 1000

## 2022-12-06 MED ORDER — SODIUM CHLORIDE 0.9 % IV SOLN
100.0000 mg/m2 | Freq: Once | INTRAVENOUS | Status: AC
  Administered 2022-12-06: 148 mg via INTRAVENOUS
  Filled 2022-12-06: qty 7.4

## 2022-12-06 MED ORDER — SODIUM CHLORIDE 0.9 % IV SOLN: Freq: Once | INTRAVENOUS | Status: AC

## 2022-12-06 MED ORDER — SODIUM CHLORIDE 0.9 % IV SOLN
80.0000 mg/m2 | Freq: Once | INTRAVENOUS | Status: AC
  Administered 2022-12-06: 118 mg via INTRAVENOUS
  Filled 2022-12-06: qty 118

## 2022-12-06 MED ORDER — MAGNESIUM SULFATE 2 GM/50ML IV SOLN
2.0000 g | Freq: Once | INTRAVENOUS | Status: AC
  Administered 2022-12-06: 2 g via INTRAVENOUS
  Filled 2022-12-06: qty 50

## 2022-12-06 MED ORDER — HEPARIN SOD (PORK) LOCK FLUSH 100 UNIT/ML IV SOLN
500.0000 [IU] | Freq: Once | INTRAVENOUS | Status: AC | PRN
  Administered 2022-12-06: 500 [IU]

## 2022-12-06 MED ORDER — PALONOSETRON HCL INJECTION 0.25 MG/5ML
0.2500 mg | Freq: Once | INTRAVENOUS | Status: AC
  Administered 2022-12-06: 0.25 mg via INTRAVENOUS
  Filled 2022-12-06: qty 5

## 2022-12-06 MED FILL — Dexamethasone Sodium Phosphate Inj 100 MG/10ML: INTRAMUSCULAR | Qty: 1 | Status: AC

## 2022-12-06 NOTE — Patient Instructions (Signed)
[facility]  Discharge Instructions: Thank you for choosing [facility] to provide your oncology and hematology care.   If you have a lab appointment with the [facility], please go directly to the [facility] and check in at the registration area.   Wear comfortable clothing and clothing appropriate for easy access to any Portacath or PICC line.   We strive to give you quality time with your provider. You may need to reschedule your appointment if you arrive late (15 or more minutes).  Arriving late affects you and other patients whose appointments are after yours.  Also, if you miss three or more appointments without notifying the office, you may be dismissed from the clinic at the provider's discretion.      For prescription refill requests, have your pharmacy contact our office and allow 72 hours for refills to be completed.    Today you received the following chemotherapy and/or immunotherapy agents: Cisplatin and Etoposide   To help prevent nausea and vomiting after your treatment, we encourage you to take your nausea medication as directed.  BELOW ARE SYMPTOMS THAT SHOULD BE REPORTED IMMEDIATELY: *FEVER GREATER THAN 100.4 F (38 C) OR HIGHER *CHILLS OR SWEATING *NAUSEA AND VOMITING THAT IS NOT CONTROLLED WITH YOUR NAUSEA MEDICATION *UNUSUAL SHORTNESS OF BREATH *UNUSUAL BRUISING OR BLEEDING *URINARY PROBLEMS (pain or burning when urinating, or frequent urination) *BOWEL PROBLEMS (unusual diarrhea, constipation, pain near the anus) TENDERNESS IN MOUTH AND THROAT WITH OR WITHOUT PRESENCE OF ULCERS (sore throat, sores in mouth, or a toothache) UNUSUAL RASH, SWELLING OR PAIN  UNUSUAL VAGINAL DISCHARGE OR ITCHING   Items with * indicate a potential emergency and should be followed up as soon as possible or go to the Emergency Department if any problems should occur.  Please show the CHEMOTHERAPY ALERT CARD or IMMUNOTHERAPY ALERT  CARD at check-in to the Emergency Department and triage nurse.  Should you have questions after your visit or need to cancel or reschedule your appointment, please contact [facility]  Dept: [phone]  and follow the prompts.  Office hours are 8:00 a.m. to 4:30 p.m. Monday - Friday. Please note that voicemails left after 4:00 p.m. may not be returned until the following business day.  We are closed weekends and major holidays. You have access to a nurse at all times for urgent questions. Please call the main number to the clinic Dept: [phone] and follow the prompts.   For any non-urgent questions, you may also contact your provider using MyChart. We now offer e-Visits for anyone 51 and older to request care online for non-urgent symptoms. For details visit [email or web address].   Also download the MyChart app! Go to the app store, search "MyChart", open the app, select [facility], and log in with your MyChart username and password.  Cisplatin Injection What is this medication? CISPLATIN (SIS pla tin) treats some types of cancer. It works by slowing down the growth of cancer cells. This medicine may be used for other purposes; ask your health care provider or pharmacist if you have questions. COMMON BRAND NAME(S): Platinol, Platinol -AQ What should I tell my care team before I take this medication? They need to know if you have any of these conditions: Eye disease, vision problems Hearing problems Kidney disease Low blood counts, such as low white cells, platelets, or red blood cells Tingling of the fingers or toes, or other nerve disorder An unusual or  allergic reaction to cisplatin, carboplatin, oxaliplatin, other medications, foods, dyes, or preservatives If you or your partner are pregnant or trying to get pregnant Breast-feeding How should I use this medication? This medication is injected into a vein. It is given by your care team in a  hospital or clinic setting. Talk to your care team about the use of this medication in children. Special care may be needed. Overdosage: If you think you have taken too much of this medicine contact a poison control center or emergency room at once. NOTE: This medicine is only for you. Do not share this medicine with others. What if I miss a dose? Keep appointments for follow-up doses. It is important not to miss your dose. Call your care team if you are unable to keep an appointment. What may interact with this medication? Do not take this medication with any of the following: Live virus vaccines This medication may also interact with the following: Certain antibiotics, such as amikacin, gentamicin, neomycin, polymyxin B, streptomycin, tobramycin, vancomycin Foscarnet This list may not describe all possible interactions. Give your health care provider a list of all the medicines, herbs, non-prescription drugs, or dietary supplements you use. Also tell them if you smoke, drink alcohol, or use illegal drugs. Some items may interact with your medicine. What should I watch for while using this medication? Your condition will be monitored carefully while you are receiving this medication. You may need blood work done while taking this medication. This medication may make you feel generally unwell. This is not uncommon, as chemotherapy can affect healthy cells as well as cancer cells. Report any side effects. Continue your course of treatment even though you feel ill unless your care team tells you to stop. This medication may increase your risk of getting an infection. Call your care team for advice if you get a fever, chills, sore throat, or other symptoms of a cold or flu. Do not treat yourself. Try to avoid being around people who are sick. Avoid taking medications that contain aspirin, acetaminophen, ibuprofen, naproxen, or ketoprofen unless instructed by your care team. These medications may hide a  fever. This medication may increase your risk to bruise or bleed. Call your care team if you notice any unusual bleeding. Be careful brushing or flossing your teeth or using a toothpick because you may get an infection or bleed more easily. If you have any dental work done, tell your dentist you are receiving this medication. Drink fluids as directed while you are taking this medication. This will help protect your kidneys. Call your care team if you get diarrhea. Do not treat yourself. Talk to your care team if you or your partner wish to become pregnant or think you might be pregnant. This medication can cause serious birth defects if taken during pregnancy and for 14 months after the last dose. A negative pregnancy test is required before starting this medication. A reliable form of contraception is recommended while taking this medication and for 14 months after the last dose. Talk to your care team about effective forms of contraception. Do not father a child while taking this medication and for 11 months after the last dose. Use a condom during sex during this time period. Do not breast-feed while taking this medication. This medication may cause infertility. Talk to your care team if you are concerned about your fertility. What side effects may I notice from receiving this medication? Side effects that you should report to your care team  as soon as possible: Allergic reactions--skin rash, itching, hives, swelling of the face, lips, tongue, or throat Eye pain, change in vision, vision loss Hearing loss, ringing in ears Infection--fever, chills, cough, sore throat, wounds that don't heal, pain or trouble when passing urine, general feeling of discomfort or being unwell Kidney injury--decrease in the amount of urine, swelling of the ankles, hands, or feet Low red blood cell level--unusual weakness or fatigue, dizziness, headache, trouble breathing Painful swelling, warmth, or redness of the skin,  blisters or sores at the infusion site Pain, tingling, or numbness in the hands or feet Unusual bruising or bleeding Side effects that usually do not require medical attention (report to your care team if they continue or are bothersome): Hair loss Nausea Vomiting This list may not describe all possible side effects. Call your doctor for medical advice about side effects. You may report side effects to FDA at 1-800-FDA-1088. Where should I keep my medication? This medication is given in a hospital or clinic. It will not be stored at home. NOTE: This sheet is a summary. It may not cover all possible information. If you have questions about this medicine, talk to your doctor, pharmacist, or health care provider.  2023 Elsevier/Gold Standard (2022-01-22 00:00:00)  Etoposide Injection What is this medication? ETOPOSIDE (e toe POE side) treats some types of cancer. It works by slowing down the growth of cancer cells. This medicine may be used for other purposes; ask your health care provider or pharmacist if you have questions. COMMON BRAND NAME(S): Etopophos, Toposar, VePesid What should I tell my care team before I take this medication? They need to know if you have any of these conditions: Infection Kidney disease Liver disease Low blood counts, such as low white cell, platelet, red cell counts An unusual or allergic reaction to etoposide, other medications, foods, dyes, or preservatives If you or your partner are pregnant or trying to get pregnant Breastfeeding How should I use this medication? This medication is injected into a vein. It is given by your care team in a hospital or clinic setting. Talk to your care team about the use of this medication in children. Special care may be needed. Overdosage: If you think you have taken too much of this medicine contact a poison control center or emergency room at once. NOTE: This medicine is only for you. Do not share this medicine with  others. What if I miss a dose? Keep appointments for follow-up doses. It is important not to miss your dose. Call your care team if you are unable to keep an appointment. What may interact with this medication? Warfarin This list may not describe all possible interactions. Give your health care provider a list of all the medicines, herbs, non-prescription drugs, or dietary supplements you use. Also tell them if you smoke, drink alcohol, or use illegal drugs. Some items may interact with your medicine. What should I watch for while using this medication? Your condition will be monitored carefully while you are receiving this medication. This medication may make you feel generally unwell. This is not uncommon as chemotherapy can affect healthy cells as well as cancer cells. Report any side effects. Continue your course of treatment even though you feel ill unless your care team tells you to stop. This medication can cause serious side effects. To reduce the risk, your care team may give you other medications to take before receiving this one. Be sure to follow the directions from your care team. This medication  may increase your risk of getting an infection. Call your care team for advice if you get a fever, chills, sore throat, or other symptoms of a cold or flu. Do not treat yourself. Try to avoid being around people who are sick. This medication may increase your risk to bruise or bleed. Call your care team if you notice any unusual bleeding. Talk to your care team about your risk of cancer. You may be more at risk for certain types of cancers if you take this medication. Talk to your care team if you may be pregnant. Serious birth defects can occur if you take this medication during pregnancy and for 6 months after the last dose. You will need a negative pregnancy test before starting this medication. Contraception is recommended while taking this medication and for 6 months after the last dose. Your  care team can help you find the option that works for you. If your partner can get pregnant, use a condom during sex while taking this medication and for 4 months after the last dose. Do not breastfeed while taking this medication. This medication may cause infertility. Talk to your care team if you are concerned about your fertility. What side effects may I notice from receiving this medication? Side effects that you should report to your care team as soon as possible: Allergic reactions--skin rash, itching, hives, swelling of the face, lips, tongue, or throat Infection--fever, chills, cough, sore throat, wounds that don't heal, pain or trouble when passing urine, general feeling of discomfort or being unwell Low red blood cell level--unusual weakness or fatigue, dizziness, headache, trouble breathing Unusual bruising or bleeding Side effects that usually do not require medical attention (report to your care team if they continue or are bothersome): Diarrhea Fatigue Hair loss Loss of appetite Nausea Vomiting This list may not describe all possible side effects. Call your doctor for medical advice about side effects. You may report side effects to FDA at 1-800-FDA-1088. Where should I keep my medication? This medication is given in a hospital or clinic. It will not be stored at home. NOTE: This sheet is a summary. It may not cover all possible information. If you have questions about this medicine, talk to your doctor, pharmacist, or health care provider.  2023 Elsevier/Gold Standard (2007-11-18 00:00:00)   **Take oral Magnesium as prescribed. Hypomagnesemia Hypomagnesemia is a condition in which the level of magnesium in the blood is too low. Magnesium is a mineral that is found in many foods. It is used in many different processes in the body. Hypomagnesemia can affect every organ in the body. In severe cases, it can cause life-threatening problems. What are the causes? This condition  may be caused by: Not getting enough magnesium in your diet or not having enough healthy foods to eat (malnutrition). Problems with magnesium absorption in the intestines. Dehydration. Excessive use of alcohol. Vomiting. Severe or long-term (chronic) diarrhea. Some medicines, including medicines that make you urinate more often (diuretics). Certain diseases, such as kidney disease, diabetes, celiac disease, and overactive thyroid. What are the signs or symptoms? Symptoms of this condition include: Loss of appetite, nausea, and vomiting. Involuntary shaking or trembling of a body part (tremor). Muscle weakness or tingling in the arms and legs. Sudden tightening of muscles (muscle spasms). Confusion. Psychiatric issues, such as: Depression and irritability. Psychosis. A feeling of fluttering of the heart (palpitations). Seizures. These symptoms are more severe if magnesium levels drop suddenly. How is this diagnosed? This condition may be diagnosed  based on: Your symptoms and medical history. A physical exam. Blood and urine tests. How is this treated? Treatment depends on the cause and the severity of the condition. It may be treated by: Taking a magnesium supplement. This can be taken in pill form. If the condition is severe, magnesium is usually given through an IV. Making changes to your diet. You may be directed to eat foods that have a lot of magnesium, such as green leafy vegetables, peas, beans, and nuts. Not drinking alcohol. If you are struggling not to drink, ask your health care provider for help. Follow these instructions at home: Eating and drinking     Make sure that your diet includes foods with magnesium. Foods that have a lot of magnesium in them include: Green leafy vegetables, such as spinach and broccoli. Beans and peas. Nuts and seeds, such as almonds and sunflower seeds. Whole grains, such as whole grain bread and fortified cereals. Drink fluids that  contain salts and minerals (electrolytes), such as sports drinks, when you are active. Do not drink alcohol. General instructions Take over-the-counter and prescription medicines only as told by your health care provider. Take magnesium supplements as directed if your health care provider tells you to take them. Have your magnesium levels monitored as told by your health care provider. Keep all follow-up visits. This is important. Contact a health care provider if: You get worse instead of better. Your symptoms return. Get help right away if: You develop severe muscle weakness. You have trouble breathing. You feel that your heart is racing. These symptoms may represent a serious problem that is an emergency. Do not wait to see if the symptoms will go away. Get medical help right away. Call your local emergency services (911 in the [place] Do not drive yourself to the hospital. Summary Hypomagnesemia is a condition in which the level of magnesium in the blood is too low. Hypomagnesemia can affect every organ in the body. Treatment may include eating more foods that contain magnesium, taking magnesium supplements, and not drinking alcohol. Have your magnesium levels monitored as told by your health care provider. This information is not intended to replace advice given to you by your health care provider. Make sure you discuss any questions you have with your health care provider. Document Revised: 02/24/2021 Document Reviewed: 02/24/2021 Elsevier Patient Education  2023 Elsevier Inc.  

## 2022-12-06 NOTE — Progress Notes (Signed)
Dr. Burr Medico aware of blood sugar of 49 on labs. Ok to treat today. Patient given two orange juices with sugar in it- will recheck in 30-60 minutes per MD request. Patient blood sugar rechecked and was 123. Encouraged to eat before coming in for treatment (had no breakfast).

## 2022-12-07 ENCOUNTER — Inpatient Hospital Stay: Payer: Medicare Other

## 2022-12-07 VITALS — BP 114/64 | HR 70 | Temp 98.0°F | Resp 18

## 2022-12-07 DIAGNOSIS — Z79811 Long term (current) use of aromatase inhibitors: Secondary | ICD-10-CM | POA: Diagnosis not present

## 2022-12-07 DIAGNOSIS — Z170 Estrogen receptor positive status [ER+]: Secondary | ICD-10-CM | POA: Diagnosis not present

## 2022-12-07 DIAGNOSIS — C3490 Malignant neoplasm of unspecified part of unspecified bronchus or lung: Secondary | ICD-10-CM

## 2022-12-07 DIAGNOSIS — Z5189 Encounter for other specified aftercare: Secondary | ICD-10-CM | POA: Diagnosis not present

## 2022-12-07 DIAGNOSIS — Z5111 Encounter for antineoplastic chemotherapy: Secondary | ICD-10-CM | POA: Diagnosis not present

## 2022-12-07 DIAGNOSIS — C3412 Malignant neoplasm of upper lobe, left bronchus or lung: Secondary | ICD-10-CM | POA: Diagnosis not present

## 2022-12-07 DIAGNOSIS — C50212 Malignant neoplasm of upper-inner quadrant of left female breast: Secondary | ICD-10-CM | POA: Diagnosis not present

## 2022-12-07 DIAGNOSIS — Z452 Encounter for adjustment and management of vascular access device: Secondary | ICD-10-CM | POA: Diagnosis not present

## 2022-12-07 DIAGNOSIS — C778 Secondary and unspecified malignant neoplasm of lymph nodes of multiple regions: Secondary | ICD-10-CM | POA: Diagnosis not present

## 2022-12-07 MED ORDER — SODIUM CHLORIDE 0.9 % IV SOLN: Freq: Once | INTRAVENOUS | Status: AC

## 2022-12-07 MED ORDER — SODIUM CHLORIDE 0.9 % IV SOLN
100.0000 mg/m2 | Freq: Once | INTRAVENOUS | Status: AC
  Administered 2022-12-07: 148 mg via INTRAVENOUS
  Filled 2022-12-07: qty 7.4

## 2022-12-07 MED ORDER — HEPARIN SOD (PORK) LOCK FLUSH 100 UNIT/ML IV SOLN
500.0000 [IU] | Freq: Once | INTRAVENOUS | Status: AC | PRN
  Administered 2022-12-07: 500 [IU]

## 2022-12-07 MED ORDER — SODIUM CHLORIDE 0.9 % IV SOLN
10.0000 mg | Freq: Once | INTRAVENOUS | Status: AC
  Administered 2022-12-07: 10 mg via INTRAVENOUS
  Filled 2022-12-07: qty 10

## 2022-12-07 MED ORDER — SODIUM CHLORIDE 0.9% FLUSH
10.0000 mL | INTRAVENOUS | Status: DC | PRN
  Administered 2022-12-07: 10 mL

## 2022-12-07 MED FILL — Dexamethasone Sodium Phosphate Inj 100 MG/10ML: INTRAMUSCULAR | Qty: 1 | Status: AC

## 2022-12-07 NOTE — Patient Instructions (Signed)
[facility]  Discharge Instructions: Thank you for choosing [facility] to provide your oncology and hematology care.   If you have a lab appointment with the Cancer Center, please go directly to the Cancer Center and check in at the registration area.   Wear comfortable clothing and clothing appropriate for easy access to any Portacath or PICC line.   We strive to give you quality time with your provider. You may need to reschedule your appointment if you arrive late (15 or more minutes).  Arriving late affects you and other patients whose appointments are after yours.  Also, if you miss three or more appointments without notifying the office, you may be dismissed from the clinic at the provider's discretion.      For prescription refill requests, have your pharmacy contact our office and allow 72 hours for refills to be completed.    Today you received the following chemotherapy and/or immunotherapy agents: etoposide      To help prevent nausea and vomiting after your treatment, we encourage you to take your nausea medication as directed.  BELOW ARE SYMPTOMS THAT SHOULD BE REPORTED IMMEDIATELY: *FEVER GREATER THAN 100.4 F (38 C) OR HIGHER *CHILLS OR SWEATING *NAUSEA AND VOMITING THAT IS NOT CONTROLLED WITH YOUR NAUSEA MEDICATION *UNUSUAL SHORTNESS OF BREATH *UNUSUAL BRUISING OR BLEEDING *URINARY PROBLEMS (pain or burning when urinating, or frequent urination) *BOWEL PROBLEMS (unusual diarrhea, constipation, pain near the anus) TENDERNESS IN MOUTH AND THROAT WITH OR WITHOUT PRESENCE OF ULCERS (sore throat, sores in mouth, or a toothache) UNUSUAL RASH, SWELLING OR PAIN  UNUSUAL VAGINAL DISCHARGE OR ITCHING   Items with * indicate a potential emergency and should be followed up as soon as possible or go to the Emergency Department if any problems should occur.  Please show the CHEMOTHERAPY ALERT CARD or IMMUNOTHERAPY ALERT CARD at  check-in to the Emergency Department and triage nurse.  Should you have questions after your visit or need to cancel or reschedule your appointment, please contact [facility]  Dept: [phone]  and follow the prompts.  Office hours are 8:00 a.m. to 4:30 p.m. Monday - Friday. Please note that voicemails left after 4:00 p.m. may not be returned until the following business day.  We are closed weekends and major holidays. You have access to a nurse at all times for urgent questions. Please call the main number to the clinic Dept: [phone] and follow the prompts.   For any non-urgent questions, you may also contact your provider using MyChart. We now offer e-Visits for anyone 18 and older to request care online for non-urgent symptoms. For details visit [email or web address].   Also download the MyChart app! Go to the app store, search "MyChart", open the app, select [facility], and log in with your MyChart username and password.   

## 2022-12-08 ENCOUNTER — Inpatient Hospital Stay: Payer: Medicare Other

## 2022-12-08 VITALS — BP 105/65 | HR 76 | Temp 97.8°F | Resp 18

## 2022-12-08 DIAGNOSIS — Z5111 Encounter for antineoplastic chemotherapy: Secondary | ICD-10-CM | POA: Diagnosis not present

## 2022-12-08 DIAGNOSIS — C3490 Malignant neoplasm of unspecified part of unspecified bronchus or lung: Secondary | ICD-10-CM

## 2022-12-08 DIAGNOSIS — Z5189 Encounter for other specified aftercare: Secondary | ICD-10-CM | POA: Diagnosis not present

## 2022-12-08 DIAGNOSIS — C50212 Malignant neoplasm of upper-inner quadrant of left female breast: Secondary | ICD-10-CM | POA: Diagnosis not present

## 2022-12-08 DIAGNOSIS — C3412 Malignant neoplasm of upper lobe, left bronchus or lung: Secondary | ICD-10-CM | POA: Diagnosis not present

## 2022-12-08 DIAGNOSIS — Z452 Encounter for adjustment and management of vascular access device: Secondary | ICD-10-CM | POA: Diagnosis not present

## 2022-12-08 DIAGNOSIS — C778 Secondary and unspecified malignant neoplasm of lymph nodes of multiple regions: Secondary | ICD-10-CM | POA: Diagnosis not present

## 2022-12-08 DIAGNOSIS — Z170 Estrogen receptor positive status [ER+]: Secondary | ICD-10-CM | POA: Diagnosis not present

## 2022-12-08 DIAGNOSIS — Z79811 Long term (current) use of aromatase inhibitors: Secondary | ICD-10-CM | POA: Diagnosis not present

## 2022-12-08 MED ORDER — SODIUM CHLORIDE 0.9 % IV SOLN
100.0000 mg/m2 | Freq: Once | INTRAVENOUS | Status: AC
  Administered 2022-12-08: 148 mg via INTRAVENOUS
  Filled 2022-12-08: qty 7.4

## 2022-12-08 MED ORDER — SODIUM CHLORIDE 0.9 % IV SOLN
10.0000 mg | Freq: Once | INTRAVENOUS | Status: AC
  Administered 2022-12-08: 10 mg via INTRAVENOUS
  Filled 2022-12-08: qty 10

## 2022-12-08 MED ORDER — SODIUM CHLORIDE 0.9 % IV SOLN: Freq: Once | INTRAVENOUS | Status: AC

## 2022-12-08 MED ORDER — SODIUM CHLORIDE 0.9% FLUSH
10.0000 mL | INTRAVENOUS | Status: DC | PRN
  Administered 2022-12-08: 10 mL

## 2022-12-08 MED ORDER — HEPARIN SOD (PORK) LOCK FLUSH 100 UNIT/ML IV SOLN
500.0000 [IU] | Freq: Once | INTRAVENOUS | Status: AC | PRN
  Administered 2022-12-08: 500 [IU]

## 2022-12-08 NOTE — Patient Instructions (Signed)
[facility]  Discharge Instructions: Thank you for choosing [facility] to provide your oncology and hematology care.   If you have a lab appointment with the Cancer Center, please go directly to the Cancer Center and check in at the registration area.   Wear comfortable clothing and clothing appropriate for easy access to any Portacath or PICC line.   We strive to give you quality time with your provider. You may need to reschedule your appointment if you arrive late (15 or more minutes).  Arriving late affects you and other patients whose appointments are after yours.  Also, if you miss three or more appointments without notifying the office, you may be dismissed from the clinic at the provider's discretion.      For prescription refill requests, have your pharmacy contact our office and allow 72 hours for refills to be completed.    Today you received the following chemotherapy and/or immunotherapy agents; Etoposide      To help prevent nausea and vomiting after your treatment, we encourage you to take your nausea medication as directed.  BELOW ARE SYMPTOMS THAT SHOULD BE REPORTED IMMEDIATELY: *FEVER GREATER THAN 100.4 F (38 C) OR HIGHER *CHILLS OR SWEATING *NAUSEA AND VOMITING THAT IS NOT CONTROLLED WITH YOUR NAUSEA MEDICATION *UNUSUAL SHORTNESS OF BREATH *UNUSUAL BRUISING OR BLEEDING *URINARY PROBLEMS (pain or burning when urinating, or frequent urination) *BOWEL PROBLEMS (unusual diarrhea, constipation, pain near the anus) TENDERNESS IN MOUTH AND THROAT WITH OR WITHOUT PRESENCE OF ULCERS (sore throat, sores in mouth, or a toothache) UNUSUAL RASH, SWELLING OR PAIN  UNUSUAL VAGINAL DISCHARGE OR ITCHING   Items with * indicate a potential emergency and should be followed up as soon as possible or go to the Emergency Department if any problems should occur.  Please show the CHEMOTHERAPY ALERT CARD or IMMUNOTHERAPY ALERT CARD at  check-in to the Emergency Department and triage nurse.  Should you have questions after your visit or need to cancel or reschedule your appointment, please contact [facility]  Dept: [phone]  and follow the prompts.  Office hours are 8:00 a.m. to 4:30 p.m. Monday - Friday. Please note that voicemails left after 4:00 p.m. may not be returned until the following business day.  We are closed weekends and major holidays. You have access to a nurse at all times for urgent questions. Please call the main number to the clinic Dept: [phone] and follow the prompts.   For any non-urgent questions, you may also contact your provider using MyChart. We now offer e-Visits for anyone 18 and older to request care online for non-urgent symptoms. For details visit [email or web address].   Also download the MyChart app! Go to the app store, search "MyChart", open the app, select , and log in with your MyChart username and password.   

## 2022-12-10 ENCOUNTER — Inpatient Hospital Stay: Payer: Medicare Other | Attending: Hematology

## 2022-12-10 VITALS — BP 116/72 | HR 78 | Temp 97.7°F | Resp 16

## 2022-12-10 DIAGNOSIS — Z452 Encounter for adjustment and management of vascular access device: Secondary | ICD-10-CM | POA: Insufficient documentation

## 2022-12-10 DIAGNOSIS — C3412 Malignant neoplasm of upper lobe, left bronchus or lung: Secondary | ICD-10-CM | POA: Diagnosis not present

## 2022-12-10 DIAGNOSIS — C3490 Malignant neoplasm of unspecified part of unspecified bronchus or lung: Secondary | ICD-10-CM

## 2022-12-10 MED ORDER — PEGFILGRASTIM-CBQV 6 MG/0.6ML ~~LOC~~ SOSY
6.0000 mg | PREFILLED_SYRINGE | Freq: Once | SUBCUTANEOUS | Status: AC
  Administered 2022-12-10: 6 mg via SUBCUTANEOUS
  Filled 2022-12-10: qty 0.6

## 2022-12-10 NOTE — Patient Instructions (Signed)

## 2022-12-12 DIAGNOSIS — J449 Chronic obstructive pulmonary disease, unspecified: Secondary | ICD-10-CM | POA: Diagnosis not present

## 2022-12-14 DIAGNOSIS — J449 Chronic obstructive pulmonary disease, unspecified: Secondary | ICD-10-CM | POA: Diagnosis not present

## 2023-01-10 DIAGNOSIS — E7800 Pure hypercholesterolemia, unspecified: Secondary | ICD-10-CM | POA: Diagnosis not present

## 2023-01-10 DIAGNOSIS — D751 Secondary polycythemia: Secondary | ICD-10-CM | POA: Diagnosis not present

## 2023-01-10 DIAGNOSIS — E1165 Type 2 diabetes mellitus with hyperglycemia: Secondary | ICD-10-CM | POA: Diagnosis not present

## 2023-01-10 DIAGNOSIS — I10 Essential (primary) hypertension: Secondary | ICD-10-CM | POA: Diagnosis not present

## 2023-01-12 DIAGNOSIS — J449 Chronic obstructive pulmonary disease, unspecified: Secondary | ICD-10-CM | POA: Diagnosis not present

## 2023-01-14 DIAGNOSIS — J449 Chronic obstructive pulmonary disease, unspecified: Secondary | ICD-10-CM | POA: Diagnosis not present

## 2023-01-16 NOTE — Assessment & Plan Note (Signed)
-  [id number], stage III, limited stage  -found on screening CT chest on July 22, 2022 which showed suprahilar left upper lobe pulmonary mass and mediastinal adenopathy -PET scan from August 08, 2022 showed hypermetabolic left hilar lung mass with mediastinal and left supraclavicular nodal metastasis. 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 Brain MRI negative  -started concurrent chemo cisplatin/etoposide and radiation 09/29/2022, completed RT on 11/15/2022 -she completed 4 cycle chemo on 12/06/22 -will refer her back to rad/onc for prophylactic brain radiation. -Will repeat a CT chest in 6 to 8 weeks.

## 2023-01-17 ENCOUNTER — Other Ambulatory Visit: Payer: Self-pay

## 2023-01-17 ENCOUNTER — Inpatient Hospital Stay: Payer: Medicare Other | Admitting: Hematology

## 2023-01-17 ENCOUNTER — Inpatient Hospital Stay: Payer: Medicare Other | Attending: Hematology

## 2023-01-17 ENCOUNTER — Encounter: Payer: Self-pay | Admitting: Hematology

## 2023-01-17 VITALS — BP 121/65 | HR 76 | Temp 97.6°F | Resp 18 | Ht 65.0 in | Wt 104.4 lb

## 2023-01-17 DIAGNOSIS — D751 Secondary polycythemia: Secondary | ICD-10-CM

## 2023-01-17 DIAGNOSIS — E1165 Type 2 diabetes mellitus with hyperglycemia: Secondary | ICD-10-CM | POA: Diagnosis not present

## 2023-01-17 DIAGNOSIS — C3490 Malignant neoplasm of unspecified part of unspecified bronchus or lung: Secondary | ICD-10-CM

## 2023-01-17 DIAGNOSIS — Z452 Encounter for adjustment and management of vascular access device: Secondary | ICD-10-CM | POA: Insufficient documentation

## 2023-01-17 DIAGNOSIS — I10 Essential (primary) hypertension: Secondary | ICD-10-CM | POA: Diagnosis not present

## 2023-01-17 DIAGNOSIS — Z170 Estrogen receptor positive status [ER+]: Secondary | ICD-10-CM | POA: Insufficient documentation

## 2023-01-17 DIAGNOSIS — C3412 Malignant neoplasm of upper lobe, left bronchus or lung: Secondary | ICD-10-CM | POA: Diagnosis not present

## 2023-01-17 DIAGNOSIS — C50212 Malignant neoplasm of upper-inner quadrant of left female breast: Secondary | ICD-10-CM | POA: Diagnosis not present

## 2023-01-17 DIAGNOSIS — E7800 Pure hypercholesterolemia, unspecified: Secondary | ICD-10-CM | POA: Diagnosis not present

## 2023-01-17 DIAGNOSIS — R918 Other nonspecific abnormal finding of lung field: Secondary | ICD-10-CM | POA: Diagnosis not present

## 2023-01-17 DIAGNOSIS — Z79811 Long term (current) use of aromatase inhibitors: Secondary | ICD-10-CM | POA: Diagnosis not present

## 2023-01-17 DIAGNOSIS — K219 Gastro-esophageal reflux disease without esophagitis: Secondary | ICD-10-CM | POA: Diagnosis not present

## 2023-01-17 DIAGNOSIS — R569 Unspecified convulsions: Secondary | ICD-10-CM | POA: Diagnosis not present

## 2023-01-17 DIAGNOSIS — E2839 Other primary ovarian failure: Secondary | ICD-10-CM

## 2023-01-17 DIAGNOSIS — Z95828 Presence of other vascular implants and grafts: Secondary | ICD-10-CM

## 2023-01-17 DIAGNOSIS — Z0000 Encounter for general adult medical examination without abnormal findings: Secondary | ICD-10-CM | POA: Diagnosis not present

## 2023-01-17 DIAGNOSIS — M810 Age-related osteoporosis without current pathological fracture: Secondary | ICD-10-CM | POA: Diagnosis not present

## 2023-01-17 DIAGNOSIS — Z23 Encounter for immunization: Secondary | ICD-10-CM | POA: Diagnosis not present

## 2023-01-17 DIAGNOSIS — C770 Secondary and unspecified malignant neoplasm of lymph nodes of head, face and neck: Secondary | ICD-10-CM | POA: Diagnosis not present

## 2023-01-17 LAB — CBC WITH DIFFERENTIAL (CANCER CENTER ONLY)
Abs Immature Granulocytes: 0.05 10*3/uL (ref 0.00–0.07)
Basophils Absolute: 0 10*3/uL (ref 0.0–0.1)
Basophils Relative: 1 %
Eosinophils Absolute: 0.3 10*3/uL (ref 0.0–0.5)
Eosinophils Relative: 6 %
HCT: 34.6 % — ABNORMAL LOW (ref 36.0–46.0)
Hemoglobin: 10.8 g/dL — ABNORMAL LOW (ref 12.0–15.0)
Immature Granulocytes: 1 %
Lymphocytes Relative: 8 %
Lymphs Abs: 0.4 10*3/uL — ABNORMAL LOW (ref 0.7–4.0)
MCH: 25.1 pg — ABNORMAL LOW (ref 26.0–34.0)
MCHC: 31.2 g/dL (ref 30.0–36.0)
MCV: 80.3 fL (ref 80.0–100.0)
Monocytes Absolute: 0.3 10*3/uL (ref 0.1–1.0)
Monocytes Relative: 7 %
Neutro Abs: 3.7 10*3/uL (ref 1.7–7.7)
Neutrophils Relative %: 77 %
Platelet Count: 101 10*3/uL — ABNORMAL LOW (ref 150–400)
RBC: 4.31 MIL/uL (ref 3.87–5.11)
RDW: 18 % — ABNORMAL HIGH (ref 11.5–15.5)
WBC Count: 4.8 10*3/uL (ref 4.0–10.5)
nRBC: 0 % (ref 0.0–0.2)

## 2023-01-17 LAB — CMP (CANCER CENTER ONLY)
ALT: 10 U/L (ref 0–44)
AST: 12 U/L — ABNORMAL LOW (ref 15–41)
Albumin: 4.1 g/dL (ref 3.5–5.0)
Alkaline Phosphatase: 55 U/L (ref 38–126)
Anion gap: 8 (ref 5–15)
BUN: 15 mg/dL (ref 8–23)
CO2: 26 mmol/L (ref 22–32)
Calcium: 9.4 mg/dL (ref 8.9–10.3)
Chloride: 104 mmol/L (ref 98–111)
Creatinine: 0.4 mg/dL — ABNORMAL LOW (ref 0.44–1.00)
GFR, Estimated: >60 mL/min (ref >60)
Glucose, Bld: 133 mg/dL — ABNORMAL HIGH (ref 70–99)
Potassium: 3.6 mmol/L (ref 3.5–5.1)
Sodium: 138 mmol/L (ref 135–145)
Total Bilirubin: 0.3 mg/dL (ref 0.3–1.2)
Total Protein: 6.2 g/dL — ABNORMAL LOW (ref 6.5–8.1)

## 2023-01-17 LAB — FERRITIN: Ferritin: 9 ng/mL — ABNORMAL LOW (ref 11–307)

## 2023-01-17 LAB — SAMPLE TO BLOOD BANK

## 2023-01-17 MED ORDER — SODIUM CHLORIDE 0.9% FLUSH
10.0000 mL | Freq: Once | INTRAVENOUS | Status: AC
  Administered 2023-01-17: 10 mL

## 2023-01-17 MED ORDER — ANASTROZOLE 1 MG PO TABS
1.00 mg | ORAL_TABLET | Freq: Every day | ORAL | 1 refills | Status: DC
Start: 2023-01-17 — End: 2023-04-18

## 2023-01-17 MED ORDER — HEPARIN SOD (PORK) LOCK FLUSH 100 UNIT/ML IV SOLN
500.0000 [IU] | Freq: Once | INTRAVENOUS | Status: AC
  Administered 2023-01-17: 500 [IU]

## 2023-01-17 NOTE — Progress Notes (Signed)
[facility]   Telephone:([phone] Fax:(336) [phone]   Clinic Follow up Note   Patient Care Team: Georgianne Fickamachandran, Ajith, MD as PCP - General (Internal Medicine) Rollene RotundaHochrein, James, MD as PCP - Cardiology (Cardiology) Claud KelpIngram, Haywood, MD as Consulting Physician (General Surgery) Malachy MoodFeng, Coda Mathey, MD as Consulting Physician (Hematology) Dorothy PufferMoody, John, MD as Consulting Physician (Radiation Oncology) Loa Socksausey, Lindsey Cornetto, NP as Nurse Practitioner (Hematology and Oncology)  Date of Service:  01/17/2023  CHIEF COMPLAINT: f/u of small cell lung cancer    CURRENT THERAPY:  cisplatin and etoposide every 3 weeks    {ASSESSMENT:  Monica [name] is a 77 y.o. female with   Small cell lung cancer (HCC) -[id number], stage III, limited stage  -found on screening CT chest on July 22, 2022 which showed suprahilar left upper lobe pulmonary mass and mediastinal adenopathy -PET scan from August 08, 2022 showed hypermetabolic left hilar lung mass with mediastinal and left supraclavicular nodal metastasis. 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 Brain MRI negative  -started concurrent chemo cisplatin/etoposide and radiation 09/29/2022, completed RT on 11/15/2022 -she completed 4 cycle chemo on 12/06/22 -will refer her back to rad/onc for prophylactic brain radiation. -Will repeat a CT chest in 8 weeks. -She is recovering well from chemoradiation, I encouraged her to continue high-protein high calorie diet, and stay active -Follow-up in 8 to 9 weeks after lab and CT chest  PLAN: - f/u with Dr. Kathrynn RunningManning for prophylactic cranial radiation - review and refill meds - f/u in 8 weeks with  port flush and  ct chest scan a few days before    SUMMARY OF ONCOLOGIC HISTORY: Oncology History Overview Note  Cancer Staging Malignant neoplasm of upper-inner quadrant of left breast in female, estrogen receptor positive (HCC) Staging form: Breast,  AJCC 8th Edition - Clinical stage from 02/16/2017: Stage IA (cT1b, cN0, cM0, G2, ER: Positive, PR: Positive, HER2: Negative) - Unsigned Staging comments: Staged at breast conference  - Pathologic stage from 03/16/2017: Stage IA (pT1b, pN0, cM0, G2, ER: Positive, PR: Positive, HER2: Negative, Oncotype DX score: 27) - Signed by Malachy MoodFeng, Hiawatha Dressel, MD on 04/24/2017     Malignant neoplasm of upper-inner quadrant of left breast in female, estrogen receptor positive  02/09/2017 Initial Diagnosis   Malignant neoplasm of upper-inner quadrant of left breast in female, estrogen receptor positive (HCC)   02/09/2017 Initial Biopsy   Diagnosis Breast, left, needle core biopsy - INVASIVE DUCTAL CARCINOMA, 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 Derrell LollingIngram    03/16/2017 Pathology Results   PATHOLOGY REPORT Diagnosis 03/16/17 1. Breast, lumpectomy, Left - INVASIVE DUCTAL CARCINOMA, NOTTINGHAM GRADE 2 OF 3, 0.9 CM - DUCTAL CARCINOMA IN SITU - MARGINS UNINVOLVED BY CARCINOMA (0.3 CM POSTERIOR MARGIN) - PREVIOUS BIOPSY SITE CHANGES - SEE ONCOLOGY TABLE BELOW 2. Lymph node, sentinel, biopsy, Left axillary #1 - NO CARCINOMA IDENTIFIED IN ONE LYMPH NODE (0/1) 3. Lymph node, sentinel, biopsy, Left axillary #2 - NO CARCINOMA IDENTIFIED IN ONE LYMPH NODE (0/1) 4. Lymph node, sentinel, biopsy, Left axillary #3 - NO CARCINOMA IDENTIFIED IN ONE LYMPH NODE (0/1) 5. Lymph node, sentinel, biopsy, Left axillary #4 - NO CARCINOMA IDENTIFIED IN ONE  LYMPH NODE (0/1)    03/16/2017 Oncotype testing   Recurrance score of 27 with a 18% distance recurrance in the net 10 years with Tamoxifen alone    05/19/2017  - 06/16/2017 Radiation Therapy   Radiation with Dr. Mitzi Hansen   06/2017 -  Anti-estrogen oral therapy   anastrozole 1 mg once a day starting late 06/2017     04/29/2020 Pathology Results   DIAGNOSIS:   BONE MARROW, ASPIRATE, CLOT, CORE:  -  Cellular marrow with trilineage hematopoiesis  -  No morphologic evidence of carcinoma or hematopoietic neoplasm  -  See microscopic description below   PERIPHERAL BLOOD:  -  Polycythemia and thrombocytopenia  -  See complete blood cell count   MICROSCOPIC DESCRIPTION:   PERIPHERAL BLOOD SMEAR: The peripheral blood has a polycythemia and  thrombocytopenia.  Leukocytes are morphologically unremarkable.   BONE MARROW ASPIRATE: Spicular, cellular and adequate for evaluation  Erythroid precursors: Orderly maturation without overt dysplasia  Granulocytic precursors: Orderly maturation without overt dysplasia  Megakaryocytes: Qualitatively and quantitatively unremarkable  Lymphocytes/plasma cells: No lymphocytosis or plasmacytosis    Small cell lung cancer  09/14/2022 Cancer Staging   Staging form: Lung, AJCC 8th Edition - Clinical stage from 09/14/2022: Stage IIIB (cT2, cN3, cM0) - Signed by Malachy Mood, MD on 09/16/2022   09/16/2022 Initial Diagnosis   Small cell lung cancer (HCC)   09/29/2022 -  Chemotherapy   Patient is on Treatment Plan : LUNG SMALL CELL Cisplatin (80) D1 + Etoposide (100) D1-3 q21d        INTERVAL HISTORY:  Monica [name] is here for a follow up of  small cell lung cancer, she was last seen by me on 12/06/2022. Patient presents to the clinic with family. Patients appetite has improved and she gained 5 lbs. Patient states she is losing hair still. Patient reports no pain, she still has the cough but family states she is still smoking.   All other systems were reviewed with the patient and are negative.  MEDICAL HISTORY:  Past Medical History:  Diagnosis Date   CAD (coronary artery disease)    Cypher stent 09/2002   Cancer  2018   left breast, radiation therapy   CHF (congestive heart failure)    Depression    Diabetes mellitus    Hyperlipidemia    Hypertension    Myocardial infarction    2003,went into cardiac shock   Osteoporosis    Seizures    1st seizure age late 37's; most recent 07/28/17   Sinusitis     SURGICAL HISTORY: Past Surgical History:  Procedure Laterality Date   ABDOMINAL HYSTERECTOMY     APPENDECTOMY     BREAST LUMPECTOMY WITH RADIOACTIVE SEED AND SENTINEL LYMPH NODE BIOPSY Left 03/16/2017   Procedure: INJECT BLUE DYE LEFT BREAST, LEFT BREAST LUMPECTOMY WITH RADIOACTIVE SEED AND LEFT AXILLARY DEEP SENTINEL LYMPH NODE  BIOPSY ADJACENT TISSUE TRANSFER;  Surgeon: Claud Kelp, MD;  Location: [facility] OR;  Service: General;  Laterality: Left;   CARPAL TUNNEL RELEASE Bilateral    COLONOSCOPY WITH PROPOFOL N/A 05/30/2015   Procedure: COLONOSCOPY WITH PROPOFOL;  Surgeon: Jeani Hawking, MD;  Location: [facility] END[facility];  Service: Endoscopy;  Laterality: N/A;   COLONOSCOPY WITH PROPOFOL N/A 06/09/2018   Procedure: COLONOSCOPY WITH PROPOFOL;  Surgeon: Jeani Hawking, MD;  Location: [facility];  Service: Endoscopy;  Laterality: N/A;   COLONOSCOPY WITH PROPOFOL N/A 07/31/2021   Procedure: COLONOSCOPY WITH PROPOFOL;  Surgeon: Jeani Hawking, MD;  Location: [facility] END[facility];  Service:  Endoscopy;  Laterality: N/A;   CORONARY ANGIOPLASTY WITH STENT PLACEMENT  10-02-2002   ESOPHAGOGASTRODUODENOSCOPY (EGD) WITH PROPOFOL N/A 07/31/2021   Procedure: ESOPHAGOGASTRODUODENOSCOPY (EGD) WITH PROPOFOL;  Surgeon: Jeani Hawking, MD;  Location: [facility];  Service: Endoscopy;  Laterality: N/A;   Exploratory abdominal     Bleeding in abdomen after tubal   IR IMAGING GUIDED PORT INSERTION  09/28/2022   POLYPECTOMY  06/09/2018   Procedure: POLYPECTOMY;  Surgeon: Jeani Hawking, MD;  Location: [facility];  Service: Endoscopy;;   POLYPECTOMY  07/31/2021   Procedure: POLYPECTOMY;  Surgeon: Jeani Hawking, MD;  Location: [facility]  ENDOSCOPY;  Service: Endoscopy;;   TUBAL LIGATION     UMBILICAL HERNIA REPAIR      I have reviewed the social history and family history with the patient and they are unchanged from previous note.  ALLERGIES:  is allergic to ace inhibitors, keflex [cephalexin], phenobarbital, sulfate, altace [ramipril], codeine, and dicyclomine hcl.  MEDICATIONS:  Current Outpatient Medications  Medication Sig Dispense Refill   acetaminophen (TYLENOL) 500 MG tablet Take 1,000 mg by mouth every 6 (six) hours as needed for moderate pain or headache.     albuterol (VENTOLIN HFA) 108 (90 Base) MCG/ACT inhaler INHALE 2 PUFFS BY MOUTH EVERY 6 HOURS AS NEEDED FOR WHEEZING OR  SHORTHNESS  OF  BREATH 9 g 11   anastrozole (ARIMIDEX) 1 MG tablet Take 1 tablet (1 mg total) by mouth daily. 90 tablet 1   aspirin EC 81 MG tablet Take 81 mg by mouth at bedtime.     atorvastatin (LIPITOR) 40 MG tablet Take 40 mg by mouth at bedtime.     B-D ULTRAFINE III SHORT PEN 31G X 8 MM MISC Inject into the skin as directed.     Calcium Carbonate-Vitamin D (CALTRATE 600+D PO) Take 1 tablet by mouth in the morning and at bedtime.     carvedilol (COREG) 3.125 MG tablet Take 3.125 mg by mouth 2 (two) times daily.      cetirizine (ZYRTEC) 10 MG tablet Take 10 mg by mouth daily as needed for allergies.     clonazePAM (KLONOPIN) 1 MG tablet Take 1 mg by mouth in the morning and at bedtime.     Fluticasone-Umeclidin-Vilant (TRELEGY ELLIPTA) 100-62.5-25 MCG/ACT AEPB INHALE 1 PUFF ONCE DAILY RINSE  MOUTH  AFTER 60 each 11   Insulin Detemir (LEVEMIR) 100 UNIT/ML Pen Inject 17-25 Units into the skin See admin instructions. Inject 25 units subcutaneously in the morning and inject 17 units subcutaneously at night     insulin lispro (HUMALOG) 100 UNIT/ML KwikPen Inject 7-9 Units into the skin See admin instructions. Inject 7 units in the morning, 7 units at lunch, and 9 units at night     ketorolac (ACULAR) 0.5 % ophthalmic solution Place 1 drop into  the left eye 4 (four) times daily.     levETIRAcetam (KEPPRA) 500 MG tablet Take 1 tablet (500 mg total) by mouth 2 (two) times daily. 180 tablet 3   lidocaine-prilocaine (EMLA) cream Apply to affected area once 30 g 3   losartan (COZAAR) 50 MG tablet Take 25 mg by mouth at bedtime.     magnesium oxide (MAG-OX) 400 (240 Mg) MG tablet Take 1 tablet (400 mg total) by mouth daily. 60 tablet 1   moxifloxacin (VIGAMOX) 0.5 % ophthalmic solution Place 1 drop into the left eye 4 (four) times daily.     nitroGLYCERIN (NITROSTAT) 0.4 MG SL tablet Place 1 tablet (0.4 mg total) under the tongue  every 5 (five) minutes as needed for chest pain. 25 tablet 2   Omega-3 Fatty Acids (FISH OIL) 1000 MG CAPS Take 1,000 mg by mouth in the morning and at bedtime.     omeprazole (PRILOSEC) 20 MG capsule Take 20 mg by mouth in the morning.     ondansetron (ZOFRAN) 8 MG tablet Take 1 tablet (8 mg total) by mouth every 8 (eight) hours as needed for nausea or vomiting. Start on the third day after cisplatin. 30 tablet 1   ONETOUCH VERIO test strip USE THREE TIMES A DAY AND AS NEEDED FOR HYPOGLYCEMIA     OXYGEN Inhale 2 L into the lungs at bedtime.     pioglitazone (ACTOS) 15 MG tablet Take 15 mg by mouth at bedtime.     Polyethyl Glycol-Propyl Glycol 0.4-0.3 % SOLN Place 1 drop into both eyes 2 (two) times daily as needed (itchy/irritated eyes.).     prednisoLONE acetate (PRED FORTE) 1 % ophthalmic suspension Place 1 drop into the left eye 4 (four) times daily.     prochlorperazine (COMPAZINE) 10 MG tablet Take 1 tablet (10 mg total) by mouth every 6 (six) hours as needed for nausea or vomiting. 30 tablet 1   risedronate (ACTONEL) 150 MG tablet Take 150 mg by mouth every 30 (thirty) days.     sertraline (ZOLOFT) 100 MG tablet Take 100 mg by mouth at bedtime.     TRIJARDY XR 12.5-2.02-999 MG TB24 Take 1 tablet by mouth 2 (two) times daily.     No current facility-administered medications for this visit.    PHYSICAL  EXAMINATION: ECOG PERFORMANCE STATUS: 1 - Symptomatic but completely ambulatory  Vitals:   01/17/23 1021  BP: 121/65  Pulse: 76  Resp: 18  Temp: 97.6 F (36.4 C)  SpO2: 96%   Wt Readings from Last 3 Encounters:  01/17/23 104 lb 6.4 oz (47.4 kg)  12/06/22 97 lb 14.4 oz (44.4 kg)  11/15/22 104 lb 6.4 oz (47.4 kg)     GENERAL:alert, no distress and comfortable SKIN: skin color, texture, turgor are normal, no rashes or significant lesions EYES: normal, Conjunctiva are pink and non-injected, sclera clear NECK: supple, thyroid normal size, non-tender, without nodularity LYMPH:  no palpable lymphadenopathy in the cervical, axillary  LUNGS: clear to auscultation and percussion with normal breathing effort HEART: regular rate & rhythm and no murmurs and no lower extremity edema ABDOMEN:abdomen soft, non-tender and normal bowel sounds Musculoskeletal:no cyanosis of digits and no clubbing  NEURO: alert & oriented x 3 with fluent speech, no focal motor/sensory deficits  LABORATORY DATA:  I have reviewed the data as listed    Latest Ref Rng & Units 01/17/2023   10:05 AM 12/06/2022    7:52 AM 11/25/2022   11:17 AM  CBC  WBC 4.0 - 10.5 K/uL 4.8  8.3  3.8   Hemoglobin 12.0 - 15.0 g/dL 78.2  95.6  21.3   Hematocrit 36.0 - 46.0 % 34.6  33.6  32.1   Platelets 150 - 400 K/uL 101  196  40         Latest Ref Rng & Units 12/06/2022    7:52 AM 11/25/2022   11:17 AM 11/15/2022    7:42 AM  CMP  Glucose 70 - 99 mg/dL 49  95  086   BUN 8 - 23 mg/dL 7  11  8    Creatinine 0.44 - 1.00 mg/dL 5.78  4.69  6.29   Sodium 135 - 145 mmol/L 138  137  138   Potassium 3.5 - 5.1 mmol/L 3.8  3.7  4.4   Chloride 98 - 111 mmol/L 104  102  105   CO2 22 - 32 mmol/L Calcium 8.9 - 10.3 mg/dL 8.5  9.0  9.2   Total Protein 6.5 - 8.1 g/dL 6.3  6.3  6.3   Total Bilirubin 0.3 - 1.2 mg/dL 0.3  0.4  0.4   Alkaline Phos 38 - 126 U/L 59  66  45   AST 15 - 41 U/L ALT 0 - 44 U/L RADIOGRAPHIC STUDIES: I have personally reviewed the radiological images as listed and agreed with the findings in the report. No results found.    Orders Placed This Encounter  Procedures   CT Chest W Contrast    Standing Status:   Future    Standing Expiration Date:   01/17/2024    Order Specific Question:   If indicated for the ordered procedure, I authorize the administration of contrast media per Radiology protocol    Answer:   Yes    Order Specific Question:   Does the patient have a contrast media/X-ray dye allergy?    Answer:   No    Order Specific Question:   Preferred imaging location?    Answer:   [facility]   All questions were answered. The patient knows to call the clinic with any problems, questions or concerns. No barriers to learning was detected. The total time spent in the appointment was 25 minutes.     Malachy Mood, MD 01/17/2023   I, Sharlette Dense, CMA, am acting as scribe for Malachy Mood, MD.   I have reviewed the above documentation for accuracy and completeness, and I agree with the above.

## 2023-01-18 ENCOUNTER — Other Ambulatory Visit: Payer: Self-pay

## 2023-01-19 ENCOUNTER — Encounter: Payer: Self-pay | Admitting: Hematology

## 2023-01-19 ENCOUNTER — Encounter: Payer: Self-pay | Admitting: Nurse Practitioner

## 2023-01-19 NOTE — Progress Notes (Signed)
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 Small cell lung cancer  C34.90       HISTORY OF PRESENT ILLNESS: Monica [name] is a 77 y.o. female seen at the request of Dr. Mosetta Putt.  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   In summary, she is a longtime smoker and has been undergoing lung cancer screening with low-dose CT chest scans annually.  On her recent scan from 07/22/2022, she was noted to have a Lung-RADS 4B lesion in the central suprahilar LUL, encasing the LUL airway with bulky AP window and prevascular lymphadenopathy, not present on prior CT chest scan from 07/22/2021.  This was further evaluated with a PET scan on 08/05/2022 which confirmed a markedly hypermetabolic left hilar and suprahilar mass extending into the AP window and left hilum with hypermetabolic prevascular and left supraclavicular nodes and a small focus of activity in the posterior LUL adjacent to the fissure.  She met with Dr. 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  An MRI brain scan was performed on 09/17/2022 and was without any evidence of metastatic disease.  She elected to proceed with chemoradiation and completed a 6.5-week course of daily external beam radiation on 11/15/2022 and completed her fourth and final cycle of chemotherapy on 12/06/2022.  Overall, she tolerated her  treatments well.   Of note, she has been followed by Dr. 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  She has continued on anastrozole and follow-up mammograms have not shown any evidence of disease recurrence.    Interval History:  I spoke with the patient today, to conduct her routine scheduled posttreatment follow up visit via telephone to spare the patient unnecessary potential exposure in the healthcare setting during the current COVID-19 pandemic.  The patient was notified in advance and gave permission to proceed with this visit format.  She reports that she is doing well in general and is currently without complaints.  She is gradually regaining her energy and strength since completing treatment and overall, is pleased with her progress to date.  She is scheduled for a disease restaging CT chest scan on 03/14/2023 prior to her next follow-up visit with Dr. Mosetta Putt.  Today we discussed that, given the high propensity for small cell lung cancer to metastasize to the brain, NCCN guideline recommendations are for prophylactic cranial irradiation (PCI) to reduce the risk of metastasis.  PREVIOUS RADIATION THERAPY: Yes    09/28/2022 through 11/15/2022 (concurrent with systemic chemotherapy) Site Technique Total Dose (Gy) Dose per Fx (Gy) Completed Fx Beam Energies  Bronchus, Left: Lung_L 3D 66/66 2 33/33 6X    05/19/2017 to 06/16/2017 (Dr. Mitzi Hansen):   1. The Left breast was treated to 42.5 Gy in 17  fractions at 2.5 Gy per fraction.  2. The Left breast was boosted to 7.5 Gy in 3 fractions at 2.5 Gy per fraction.  PAST MEDICAL HISTORY:  Past Medical History:  Diagnosis Date   CAD (coronary artery disease)    Cypher stent 09/2002   Cancer 2018   left breast, radiation therapy   CHF (congestive heart failure)    Depression     Diabetes mellitus    Hyperlipidemia    Hypertension    Myocardial infarction    2003,went into cardiac shock   Osteoporosis    Seizures    1st seizure age late 89's; most recent 07/28/17   Sinusitis       PAST SURGICAL HISTORY: Past Surgical History:  Procedure Laterality Date   ABDOMINAL HYSTERECTOMY     APPENDECTOMY     BREAST LUMPECTOMY WITH RADIOACTIVE SEED AND SENTINEL LYMPH NODE BIOPSY Left 03/16/2017   Procedure: INJECT BLUE DYE LEFT BREAST, LEFT BREAST LUMPECTOMY WITH RADIOACTIVE SEED AND LEFT AXILLARY DEEP SENTINEL LYMPH NODE  BIOPSY ADJACENT TISSUE TRANSFER;  Surgeon: Claud Kelp, MD;  Location: [facility];  Service: General;  Laterality: Left;   CARPAL TUNNEL RELEASE Bilateral    COLONOSCOPY WITH PROPOFOL N/A 05/30/2015   Procedure: COLONOSCOPY WITH PROPOFOL;  Surgeon: Jeani Hawking, MD;  Location: [facility];  Service: Endoscopy;  Laterality: N/A;   COLONOSCOPY WITH PROPOFOL N/A 06/09/2018   Procedure: COLONOSCOPY WITH PROPOFOL;  Surgeon: Jeani Hawking, MD;  Location: [facility];  Service: Endoscopy;  Laterality: N/A;   COLONOSCOPY WITH PROPOFOL N/A 07/31/2021   Procedure: COLONOSCOPY WITH PROPOFOL;  Surgeon: Jeani Hawking, MD;  Location: [facility];  Service: Endoscopy;  Laterality: N/A;   CORONARY ANGIOPLASTY WITH STENT PLACEMENT  10-02-2002   ESOPHAGOGASTRODUODENOSCOPY (EGD) WITH PROPOFOL N/A 07/31/2021   Procedure: ESOPHAGOGASTRODUODENOSCOPY (EGD) WITH PROPOFOL;  Surgeon: Jeani Hawking, MD;  Location: [facility];  Service: Endoscopy;  Laterality: N/A;   Exploratory abdominal     Bleeding in abdomen after tubal   IR IMAGING GUIDED PORT INSERTION  09/28/2022   POLYPECTOMY  06/09/2018   Procedure: POLYPECTOMY;  Surgeon: Jeani Hawking, MD;  Location: [facility];  Service: Endoscopy;;   POLYPECTOMY  07/31/2021   Procedure: POLYPECTOMY;  Surgeon: Jeani Hawking, MD;  Location: [facility];  Service: Endoscopy;;   TUBAL LIGATION     UMBILICAL HERNIA REPAIR       FAMILY HISTORY:  Family History  Problem Relation Age of Onset   Dementia Mother    Kidney disease Mother    Heart attack Father 72       Died age 55   Heart attack Brother 35   Heart attack Brother 74    SOCIAL HISTORY:  Social History   Socioeconomic History   Marital status: Married    Spouse name: Dorene Sorrow   Number of children: 3   Years of education: 12   Highest education level: Not on file  Occupational History   Not on file  Tobacco Use   Smoking status: Former    Packs/day: 2.00    Years: 30.00    Additional pack years: 0.00    Total pack years: 60.00    Types: Cigarettes    Quit date: 12/28/2020    Years since quitting: 2.0   Smokeless tobacco: Never   Tobacco comments:    quit mid march 2022  Vaping Use   Vaping Use: Never used  Substance and Sexual Activity   Alcohol use: No    Alcohol/week: 0.0 standard drinks of  alcohol   Drug use: No   Sexual activity: Not on file  Other Topics Concern   Not on file  Social History Narrative   Lives with husband.    Caffeine-    Social Determinants of Health   Financial Resource Strain: Not on file  Food Insecurity: Not on file  Transportation Needs: Not on file  Physical Activity: Not on file  Stress: Not on file  Social Connections: Not on file  Intimate Partner Violence: Not on file    ALLERGIES: Ace inhibitors, Keflex [cephalexin], Phenobarbital, Sulfate, Altace [ramipril], Codeine, and Dicyclomine hcl  MEDICATIONS:  Current Outpatient Medications  Medication Sig Dispense Refill   acetaminophen (TYLENOL) 500 MG tablet Take 1,000 mg by mouth every 6 (six) hours as needed for moderate pain or headache.     albuterol (VENTOLIN HFA) 108 (90 Base) MCG/ACT inhaler INHALE 2 PUFFS BY MOUTH EVERY 6 HOURS AS NEEDED FOR WHEEZING OR  SHORTHNESS  OF  BREATH 9 g 11   anastrozole (ARIMIDEX) 1 MG tablet Take 1 tablet (1 mg total) by mouth daily. 90 tablet 1   aspirin EC 81 MG tablet Take 81 mg by mouth at  bedtime.     atorvastatin (LIPITOR) 40 MG tablet Take 40 mg by mouth at bedtime.     B-D ULTRAFINE III SHORT PEN 31G X 8 MM MISC Inject into the skin as directed.     Calcium Carbonate-Vitamin D (CALTRATE 600+D PO) Take 1 tablet by mouth in the morning and at bedtime.     carvedilol (COREG) 3.125 MG tablet Take 3.125 mg by mouth 2 (two) times daily.      cetirizine (ZYRTEC) 10 MG tablet Take 10 mg by mouth daily as needed for allergies.     clonazePAM (KLONOPIN) 1 MG tablet Take 1 mg by mouth in the morning and at bedtime.     Fluticasone-Umeclidin-Vilant (TRELEGY ELLIPTA) 100-62.5-25 MCG/ACT AEPB INHALE 1 PUFF ONCE DAILY RINSE  MOUTH  AFTER 60 each 11   Insulin Detemir (LEVEMIR) 100 UNIT/ML Pen Inject 17-25 Units into the skin See admin instructions. Inject 25 units subcutaneously in the morning and inject 17 units subcutaneously at night     insulin lispro (HUMALOG) 100 UNIT/ML KwikPen Inject 7-9 Units into the skin See admin instructions. Inject 7 units in the morning, 7 units at lunch, and 9 units at night     ketorolac (ACULAR) 0.5 % ophthalmic solution Place 1 drop into the left eye 4 (four) times daily.     levETIRAcetam (KEPPRA) 500 MG tablet Take 1 tablet (500 mg total) by mouth 2 (two) times daily. 180 tablet 3   lidocaine-prilocaine (EMLA) cream Apply to affected area once 30 g 3   losartan (COZAAR) 50 MG tablet Take 25 mg by mouth at bedtime.     magnesium oxide (MAG-OX) 400 (240 Mg) MG tablet Take 1 tablet (400 mg total) by mouth daily. 60 tablet 1   moxifloxacin (VIGAMOX) 0.5 % ophthalmic solution Place 1 drop into the left eye 4 (four) times daily.     nitroGLYCERIN (NITROSTAT) 0.4 MG SL tablet Place 1 tablet (0.4 mg total) under the tongue every 5 (five) minutes as needed for chest pain. 25 tablet 2   Omega-3 Fatty Acids (FISH OIL) 1000 MG CAPS Take 1,000 mg by mouth in the morning and at bedtime.     omeprazole (PRILOSEC) 20 MG capsule Take 20 mg by mouth in the morning.      ondansetron (ZOFRAN) 8 MG  tablet Take 1 tablet (8 mg total) by mouth every 8 (eight) hours as needed for nausea or vomiting. Start on the third day after cisplatin. 30 tablet 1   ONETOUCH VERIO test strip USE THREE TIMES A DAY AND AS NEEDED FOR HYPOGLYCEMIA     OXYGEN Inhale 2 L into the lungs at bedtime.     pioglitazone (ACTOS) 15 MG tablet Take 15 mg by mouth at bedtime.     Polyethyl Glycol-Propyl Glycol 0.4-0.3 % SOLN Place 1 drop into both eyes 2 (two) times daily as needed (itchy/irritated eyes.).     prednisoLONE acetate (PRED FORTE) 1 % ophthalmic suspension Place 1 drop into the left eye 4 (four) times daily.     prochlorperazine (COMPAZINE) 10 MG tablet Take 1 tablet (10 mg total) by mouth every 6 (six) hours as needed for nausea or vomiting. 30 tablet 1   risedronate (ACTONEL) 150 MG tablet Take 150 mg by mouth every 30 (thirty) days.     sertraline (ZOLOFT) 100 MG tablet Take 100 mg by mouth at bedtime.     TRIJARDY XR 12.5-2.02-999 MG TB24 Take 1 tablet by mouth 2 (two) times daily.     No current facility-administered medications for this visit.    REVIEW OF SYSTEMS:  On review of systems, the patient reports that she is doing well in general.  She denies any chest pain, increased shortness of breath, productive cough, hemoptysis, fevers, chills, night sweats, or unintended weight changes.  She denies any bowel or bladder disturbances, and denies abdominal pain, nausea or vomiting.  She denies any new musculoskeletal or joint aches or pains. A complete review of systems is obtained and is otherwise negative.    PHYSICAL EXAM:  Wt Readings from Last 3 Encounters:  01/17/23 104 lb 6.4 oz (47.4 kg)  12/06/22 97 lb 14.4 oz (44.4 kg)  11/15/22 104 lb 6.4 oz (47.4 kg)   Temp Readings from Last 3 Encounters:  01/17/23 97.6 F (36.4 C) (Oral)  12/10/22 97.7 F (36.5 C) (Oral)  12/08/22 97.8 F (36.6 C) (Oral)   BP Readings from Last 3 Encounters:  01/17/23 121/65  12/10/22  116/72  12/08/22 105/65   Pulse Readings from Last 3 Encounters:  01/17/23 76  12/10/22 78  12/08/22 76    /10  Unable to assess due to telephone follow-up visit format.  KPS = 100  100 - Normal; no complaints; no evidence of disease. 90   - Able to carry on normal activity; minor signs or symptoms of disease. 80   - Normal activity with effort; some signs or symptoms of disease. 43   - Cares for self; unable to carry on normal activity or to do active work. 60   - Requires occasional assistance, but is able to care for most of his personal needs. 50   - Requires considerable assistance and frequent medical care. 40   - Disabled; requires special care and assistance. 30   - Severely disabled; hospital admission is indicated although death not imminent. 20   - Very sick; hospital admission necessary; active supportive treatment necessary. 10   - Moribund; fatal processes progressing rapidly. 0     - Dead  Karnofsky DA, Abelmann WH, Craver LS and Burchenal [facility] [phone]) The use of the nitrogen mustards in the palliative treatment of carcinoma: with particular reference to bronchogenic carcinoma Cancer 1 634-56  LABORATORY DATA:  Lab Results  Component Value Date   WBC 4.8 01/17/2023   HGB 10.8 (  L) 01/17/2023   HCT 34.6 (L) 01/17/2023   MCV 80.3 01/17/2023   PLT 101 (L) 01/17/2023   Lab Results  Component Value Date   NA 138 01/17/2023   K 3.6 01/17/2023   CL 104 01/17/2023   CO2 26 01/17/2023   Lab Results  Component Value Date   ALT 10 01/17/2023   AST 12 (L) 01/17/2023   ALKPHOS 55 01/17/2023   BILITOT 0.3 01/17/2023     RADIOGRAPHY: No results found.    IMPRESSION/PLAN: 1. 77 y.o. female with limited stage small cell lung cancer at risk for brain metastases.  She appears to have recovered well from the effects of her recent chemoradiation and is currently without complaints.  We discussed the need for continued close monitoring with serial CT chest scans going  forward and she is scheduled for her restaging scan on 03/14/2023 prior to her next scheduled follow-up visit with Dr.Feng on 03/17/2023.  We look forward to continuing to follow her progress via correspondence.  2. Prophylactic cranial irradiation-we discussed the increased risk of potential brain metastasis in the setting of small cell lung cancer and the recommendation for PCI to help reduce the risk from approximately 60% down to around 15% as well as an improvement in overall survival.  We reviewed the anticipated 2-week course of daily treatment as well as the acute and late side effects to be expected.  Prior to proceeding with treatment, we will obtain a baseline restaging MRI brain scan and pending this scan is negative, we would proceed with scheduling CT simulation/treatment planning in anticipation of beginning a 10 fraction course of daily radiotherapy for total dose of 25 Gy.  If there are any lesions present on the scan, we would still proceed with CT simulation/treatment planning in anticipation of beginning a 10 fraction course of daily radiotherapy for a total dose of 25 Gy.  The patient was encouraged to ask questions that were answered to her stated satisfaction and she is in agreement to proceed.  She appears to have a good understanding of her disease and is comfortable and in agreement with the stated plan.  We personally spent 45 minutes in this encounter including chart review, reviewing radiological studies, meeting face-to-face with the patient, entering orders and completing documentation.    Marguarite ArbourAshlyn W. Adrinne Sze, PA-C    Margaretmary DysMatthew Manning, MD  [facility]  Radiation Oncology Direct Dial: [phone]  Fax: [phone] [email or web address]  Skype  LinkedIn

## 2023-01-19 NOTE — Progress Notes (Signed)
                                                                                                                                                             Patient Name: Monica [name] MRN: [id number] DOB: 1946/01/19 Referring Physician: Georgianne Fick (Profile Not Attached) Date of Service: 11/15/2022 [facility], [place]                                                        End Of Treatment Note  Diagnoses: C34.12-Malignant neoplasm of upper lobe, left bronchus or lung  Cancer Staging: 77 y.o. female with limited stage small cell lung cancer.   Intent: Curative  Radiation Treatment Dates: 09/28/2022 through 11/15/2022 Site Technique Total Dose (Gy) Dose per Fx (Gy) Completed Fx Beam Energies  Bronchus, Left: Lung_L 3D 66/66 2 33/33 6X   Narrative: The patient tolerated radiation therapy relatively well.  She did report modest fatigue and mild skin irritation in the treatment field but denied any chest pain, productive cough, shortness of breath or hemoptysis.  Plan: The patient will receive a call in about one month from the radiation oncology department and we will make arrangements for a follow-up visit to discuss PCI once she has completed chemotherapy. She will continue follow up with her medical oncologist, Dr. Mosetta Putt, as well.  ------------------------------------------------   Margaretmary Dys, MD [facility]  Radiation Oncology Direct Dial: [phone]  Fax: [phone] [email or web address]  Skype  LinkedIn

## 2023-01-20 ENCOUNTER — Ambulatory Visit
Admission: RE | Admit: 2023-01-20 | Discharge: 2023-01-20 | Disposition: A | Discharge status: Home / Self Care | Payer: Medicare Other | Source: Ambulatory Visit | Attending: Urology | Admitting: Urology

## 2023-01-20 ENCOUNTER — Encounter: Payer: Self-pay | Admitting: Urology

## 2023-01-20 VITALS — Ht 65.0 in | Wt 101.0 lb

## 2023-01-20 DIAGNOSIS — Z2989 Encounter for other specified prophylactic measures: Secondary | ICD-10-CM | POA: Diagnosis not present

## 2023-01-20 DIAGNOSIS — C3490 Malignant neoplasm of unspecified part of unspecified bronchus or lung: Secondary | ICD-10-CM

## 2023-01-20 DIAGNOSIS — Z87891 Personal history of nicotine dependence: Secondary | ICD-10-CM | POA: Diagnosis not present

## 2023-01-20 DIAGNOSIS — C3412 Malignant neoplasm of upper lobe, left bronchus or lung: Secondary | ICD-10-CM | POA: Diagnosis not present

## 2023-01-20 NOTE — Progress Notes (Signed)
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 Patient identity verified.   Patient reports SOB w/ exertion. No other issues conveyed at this time.  Meaningful use complete. Hysterectomy  Ht 5\' 5"  (1.651 m)   Wt 101 lb (45.8 kg)   BMI 16.81 kg/m   This concludes the interview.   Ruel Favors, LPN

## 2023-01-24 ENCOUNTER — Telehealth: Payer: Self-pay | Admitting: *Deleted

## 2023-01-24 NOTE — Telephone Encounter (Signed)
Called patient to inform of MRI for 02-01-23- arrival time- 4:30 pm, no restrictions to test, patient to have sim on 02-03-23- arrival time- 12:45 pm @ [facility], spoke with patient and he is aware of these appts. and the instructions

## 2023-01-28 ENCOUNTER — Other Ambulatory Visit: Payer: Self-pay | Admitting: Hematology

## 2023-01-31 ENCOUNTER — Encounter: Payer: Self-pay | Admitting: Nurse Practitioner

## 2023-01-31 ENCOUNTER — Encounter: Payer: Self-pay | Admitting: Hematology

## 2023-02-01 ENCOUNTER — Encounter (HOSPITAL_COMMUNITY): Payer: Self-pay | Admitting: Radiology

## 2023-02-01 ENCOUNTER — Ambulatory Visit (HOSPITAL_COMMUNITY)
Admission: RE | Admit: 2023-02-01 | Discharge: 2023-02-01 | Disposition: A | Discharge status: Home / Self Care | Payer: Medicare Other | Source: Ambulatory Visit | Attending: Urology | Admitting: Urology

## 2023-02-01 ENCOUNTER — Telehealth: Payer: Self-pay

## 2023-02-01 DIAGNOSIS — C3490 Malignant neoplasm of unspecified part of unspecified bronchus or lung: Secondary | ICD-10-CM

## 2023-02-01 MED ORDER — GADOBUTROL 1 MMOL/ML IV SOLN
4.0000 mL | Freq: Once | INTRAVENOUS | Status: AC | PRN
  Administered 2023-02-01: 4 mL via INTRAVENOUS

## 2023-02-01 NOTE — Telephone Encounter (Addendum)
Called patient and made her aware of the message below. Patient stated she doesn't feel tired but will start the ferous sulfate as soon as she can.   ----- Message from Malachy Mood, MD sent at 02/01/2023  1:08 PM EDT ----- Please let pt know her iron level was low on last visit, I recommend oral ferrous sulfate 1 tab daily. If she is quite fatigued, I would consider iv iron also, let me know. Thanks   Malachy Mood  02/01/2023

## 2023-02-03 ENCOUNTER — Ambulatory Visit
Admission: RE | Admit: 2023-02-03 | Discharge: 2023-02-03 | Disposition: A | Discharge status: Home / Self Care | Payer: Medicare Other | Source: Ambulatory Visit | Attending: Radiation Oncology | Admitting: Radiation Oncology

## 2023-02-03 DIAGNOSIS — C3412 Malignant neoplasm of upper lobe, left bronchus or lung: Secondary | ICD-10-CM | POA: Diagnosis not present

## 2023-02-03 DIAGNOSIS — Z87891 Personal history of nicotine dependence: Secondary | ICD-10-CM | POA: Diagnosis not present

## 2023-02-03 DIAGNOSIS — C7931 Secondary malignant neoplasm of brain: Secondary | ICD-10-CM | POA: Diagnosis not present

## 2023-02-03 DIAGNOSIS — C3490 Malignant neoplasm of unspecified part of unspecified bronchus or lung: Secondary | ICD-10-CM

## 2023-02-03 DIAGNOSIS — Z2989 Encounter for other specified prophylactic measures: Secondary | ICD-10-CM | POA: Diagnosis not present

## 2023-02-04 NOTE — Progress Notes (Signed)
  Radiation Oncology         [phone] ________________________________  Name: Monica [name] MRN: [id number]  Date: 02/03/2023  DOB: [birth year]  SIMULATION AND TREATMENT PLANNING NOTE    ICD-10-CM   1. 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  Identity was confirmed.  All relevant records and images related to the planned course of therapy were reviewed.  The patient freely provided informed written consent to proceed with treatment after reviewing the details related to the planned course of therapy. The consent form was witnessed and verified by the simulation staff.  Then, the patient was set-up in a stable reproducible  supine position for radiation therapy.  CT images were obtained.  Surface markings were placed.  The CT images were loaded into the planning software.  Then the target and avoidance structures were contoured.  Treatment planning then occurred.  The radiation prescription was entered and confirmed.  Then, I designed and supervised the construction of a total of 3 medically necessary complex treatment device including a custom made thermoplastic mask used for immobilization, and two MLC collimator apertures for radiotherapy from the right and left side, with independent collimation for each to account for beam divergence.  I have requested : Isodose Plan.    PLAN:  The whole brain will be treated to 25 Gy in 10 fractions.  ________________________________  Artist Pais Kathrynn Running, M.D.   ADDENDUM: This patient had pre-simulation brain MRI on 4/23 to rule out pre-existing brain mets prior to prophylactic cranial irradiation (PCI).  Unfortunately, at the time of simulation, the MRI had not been formally read by radiology.  So we proceeded under the assumption of no brain mets and  planned 10 fractions.  On 4/26, we contacted radiology requesting the MRI be read prior to finalizing our plan.  At this point the MRI was read revealing two subcentimeter brain metastases.  Therefore, we will be adjusting the radiation prescription to 35 Gy in 14 fractions of 2.5 Gy.  Also, given the location of one met near the cribiform plate, I have requested MRI fusion to ensure adequate coverage in this area with maximal sparing of the orbits.  I called the patient today to share the MRI result and the impact on our plan.

## 2023-02-07 DIAGNOSIS — Z87891 Personal history of nicotine dependence: Secondary | ICD-10-CM | POA: Diagnosis not present

## 2023-02-07 DIAGNOSIS — C3412 Malignant neoplasm of upper lobe, left bronchus or lung: Secondary | ICD-10-CM | POA: Diagnosis not present

## 2023-02-07 DIAGNOSIS — Z2989 Encounter for other specified prophylactic measures: Secondary | ICD-10-CM | POA: Diagnosis not present

## 2023-02-07 DIAGNOSIS — C7931 Secondary malignant neoplasm of brain: Secondary | ICD-10-CM | POA: Diagnosis not present

## 2023-02-09 ENCOUNTER — Ambulatory Visit: Payer: Medicare Other | Admitting: Radiation Oncology

## 2023-02-10 ENCOUNTER — Ambulatory Visit: Payer: Medicare Other

## 2023-02-11 ENCOUNTER — Ambulatory Visit: Payer: Medicare Other

## 2023-02-11 DIAGNOSIS — J449 Chronic obstructive pulmonary disease, unspecified: Secondary | ICD-10-CM | POA: Diagnosis not present

## 2023-02-13 DIAGNOSIS — J449 Chronic obstructive pulmonary disease, unspecified: Secondary | ICD-10-CM | POA: Diagnosis not present

## 2023-02-14 ENCOUNTER — Ambulatory Visit: Payer: Medicare Other | Admitting: Radiation Oncology

## 2023-02-15 ENCOUNTER — Ambulatory Visit
Admission: RE | Admit: 2023-02-15 | Discharge: 2023-02-15 | Disposition: A | Discharge status: Home / Self Care | Payer: Medicare Other | Source: Ambulatory Visit | Attending: Radiation Oncology | Admitting: Radiation Oncology

## 2023-02-15 ENCOUNTER — Other Ambulatory Visit: Payer: Self-pay

## 2023-02-15 DIAGNOSIS — Z510 Encounter for antineoplastic radiation therapy: Secondary | ICD-10-CM | POA: Diagnosis not present

## 2023-02-15 DIAGNOSIS — C7931 Secondary malignant neoplasm of brain: Secondary | ICD-10-CM | POA: Insufficient documentation

## 2023-02-15 DIAGNOSIS — C3412 Malignant neoplasm of upper lobe, left bronchus or lung: Secondary | ICD-10-CM | POA: Diagnosis not present

## 2023-02-15 DIAGNOSIS — Z87891 Personal history of nicotine dependence: Secondary | ICD-10-CM | POA: Diagnosis not present

## 2023-02-15 DIAGNOSIS — Z2989 Encounter for other specified prophylactic measures: Secondary | ICD-10-CM | POA: Diagnosis not present

## 2023-02-15 LAB — RAD ONC ARIA SESSION SUMMARY
Course Elapsed Days: 0
Plan Fractions Treated to Date: 1
Plan Prescribed Dose Per Fraction: 2.5 Gy
Plan Total Fractions Prescribed: 14
Plan Total Prescribed Dose: 35 Gy
Reference Point Dosage Given to Date: 2.5 Gy
Reference Point Session Dosage Given: 2.5 Gy
Session Number: 1

## 2023-02-16 ENCOUNTER — Other Ambulatory Visit: Payer: Self-pay

## 2023-02-16 ENCOUNTER — Ambulatory Visit
Admission: RE | Admit: 2023-02-16 | Discharge: 2023-02-16 | Disposition: A | Discharge status: Home / Self Care | Payer: Medicare Other | Source: Ambulatory Visit | Attending: Radiation Oncology | Admitting: Radiation Oncology

## 2023-02-16 ENCOUNTER — Ambulatory Visit: Payer: Medicare Other | Admitting: Family Medicine

## 2023-02-16 DIAGNOSIS — C7931 Secondary malignant neoplasm of brain: Secondary | ICD-10-CM | POA: Diagnosis not present

## 2023-02-16 LAB — RAD ONC ARIA SESSION SUMMARY
Course Elapsed Days: 1
Plan Fractions Treated to Date: 2
Plan Prescribed Dose Per Fraction: 2.5 Gy
Plan Total Fractions Prescribed: 14
Plan Total Prescribed Dose: 35 Gy
Reference Point Dosage Given to Date: 5 Gy
Reference Point Session Dosage Given: 2.5 Gy
Session Number: 2

## 2023-02-17 ENCOUNTER — Other Ambulatory Visit: Payer: Self-pay

## 2023-02-17 ENCOUNTER — Ambulatory Visit
Admission: RE | Admit: 2023-02-17 | Discharge: 2023-02-17 | Disposition: A | Discharge status: Home / Self Care | Payer: Medicare Other | Source: Ambulatory Visit | Attending: Radiation Oncology | Admitting: Radiation Oncology

## 2023-02-17 DIAGNOSIS — C7931 Secondary malignant neoplasm of brain: Secondary | ICD-10-CM | POA: Diagnosis not present

## 2023-02-17 LAB — RAD ONC ARIA SESSION SUMMARY
Course Elapsed Days: 2
Plan Fractions Treated to Date: 3
Plan Prescribed Dose Per Fraction: 2.5 Gy
Plan Total Fractions Prescribed: 14
Plan Total Prescribed Dose: 35 Gy
Reference Point Dosage Given to Date: 7.5 Gy
Reference Point Session Dosage Given: 2.5 Gy
Session Number: 3

## 2023-02-18 ENCOUNTER — Ambulatory Visit
Admission: RE | Admit: 2023-02-18 | Discharge: 2023-02-18 | Disposition: A | Discharge status: Home / Self Care | Payer: Medicare Other | Source: Ambulatory Visit | Attending: Radiation Oncology | Admitting: Radiation Oncology

## 2023-02-18 ENCOUNTER — Other Ambulatory Visit: Payer: Self-pay

## 2023-02-18 DIAGNOSIS — C7931 Secondary malignant neoplasm of brain: Secondary | ICD-10-CM | POA: Diagnosis not present

## 2023-02-18 LAB — RAD ONC ARIA SESSION SUMMARY
Course Elapsed Days: 3
Plan Fractions Treated to Date: 4
Plan Prescribed Dose Per Fraction: 2.5 Gy
Plan Total Fractions Prescribed: 14
Plan Total Prescribed Dose: 35 Gy
Reference Point Dosage Given to Date: 10 Gy
Reference Point Session Dosage Given: 2.5 Gy
Session Number: 4

## 2023-02-21 ENCOUNTER — Ambulatory Visit
Admission: RE | Admit: 2023-02-21 | Discharge: 2023-02-21 | Disposition: A | Discharge status: Home / Self Care | Payer: Medicare Other | Source: Ambulatory Visit | Attending: Radiation Oncology | Admitting: Radiation Oncology

## 2023-02-21 ENCOUNTER — Other Ambulatory Visit: Payer: Self-pay

## 2023-02-21 DIAGNOSIS — C7931 Secondary malignant neoplasm of brain: Secondary | ICD-10-CM | POA: Diagnosis not present

## 2023-02-21 DIAGNOSIS — Z87891 Personal history of nicotine dependence: Secondary | ICD-10-CM | POA: Diagnosis not present

## 2023-02-21 DIAGNOSIS — Z2989 Encounter for other specified prophylactic measures: Secondary | ICD-10-CM | POA: Diagnosis not present

## 2023-02-21 DIAGNOSIS — Z510 Encounter for antineoplastic radiation therapy: Secondary | ICD-10-CM | POA: Diagnosis not present

## 2023-02-21 DIAGNOSIS — C3412 Malignant neoplasm of upper lobe, left bronchus or lung: Secondary | ICD-10-CM | POA: Diagnosis not present

## 2023-02-21 LAB — RAD ONC ARIA SESSION SUMMARY
Course Elapsed Days: 6
Plan Fractions Treated to Date: 5
Plan Prescribed Dose Per Fraction: 2.5 Gy
Plan Total Fractions Prescribed: 14
Plan Total Prescribed Dose: 35 Gy
Reference Point Dosage Given to Date: 12.5 Gy
Reference Point Session Dosage Given: 2.5 Gy
Session Number: 5

## 2023-02-22 ENCOUNTER — Other Ambulatory Visit: Payer: Self-pay

## 2023-02-22 ENCOUNTER — Ambulatory Visit: Payer: Medicare Other

## 2023-02-22 ENCOUNTER — Ambulatory Visit
Admission: RE | Admit: 2023-02-22 | Discharge: 2023-02-22 | Disposition: A | Discharge status: Home / Self Care | Payer: Medicare Other | Source: Ambulatory Visit | Attending: Radiation Oncology | Admitting: Radiation Oncology

## 2023-02-22 DIAGNOSIS — C7931 Secondary malignant neoplasm of brain: Secondary | ICD-10-CM | POA: Diagnosis not present

## 2023-02-22 LAB — RAD ONC ARIA SESSION SUMMARY
Course Elapsed Days: 7
Plan Fractions Treated to Date: 6
Plan Prescribed Dose Per Fraction: 2.5 Gy
Plan Total Fractions Prescribed: 14
Plan Total Prescribed Dose: 35 Gy
Reference Point Dosage Given to Date: 15 Gy
Reference Point Session Dosage Given: 2.5 Gy
Session Number: 6

## 2023-02-23 ENCOUNTER — Other Ambulatory Visit: Payer: Self-pay

## 2023-02-23 ENCOUNTER — Other Ambulatory Visit: Payer: Self-pay | Admitting: *Deleted

## 2023-02-23 ENCOUNTER — Ambulatory Visit
Admission: RE | Admit: 2023-02-23 | Discharge: 2023-02-23 | Disposition: A | Discharge status: Home / Self Care | Payer: Medicare Other | Source: Ambulatory Visit | Attending: Radiation Oncology | Admitting: Radiation Oncology

## 2023-02-23 ENCOUNTER — Ambulatory Visit: Payer: Medicare Other

## 2023-02-23 DIAGNOSIS — C7931 Secondary malignant neoplasm of brain: Secondary | ICD-10-CM | POA: Diagnosis not present

## 2023-02-23 LAB — RAD ONC ARIA SESSION SUMMARY
Course Elapsed Days: 8
Plan Fractions Treated to Date: 7
Plan Prescribed Dose Per Fraction: 2.5 Gy
Plan Total Fractions Prescribed: 14
Plan Total Prescribed Dose: 35 Gy
Reference Point Dosage Given to Date: 17.5 Gy
Reference Point Session Dosage Given: 2.5 Gy
Session Number: 7

## 2023-02-23 MED ORDER — LEVETIRACETAM 500 MG PO TABS: 500.0000 mg | ORAL_TABLET | Freq: Two times a day (BID) | ORAL | 0 refills | Status: DC

## 2023-02-23 NOTE — Telephone Encounter (Signed)
Pt last seen on 02/11/22 No follow up visit scheduled Last filled on 12/03/22 #180 tablets (90 day supply)

## 2023-02-24 ENCOUNTER — Ambulatory Visit
Admission: RE | Admit: 2023-02-24 | Discharge: 2023-02-24 | Disposition: A | Discharge status: Home / Self Care | Payer: Medicare Other | Source: Ambulatory Visit | Attending: Radiation Oncology | Admitting: Radiation Oncology

## 2023-02-24 ENCOUNTER — Other Ambulatory Visit: Payer: Self-pay

## 2023-02-24 DIAGNOSIS — C7931 Secondary malignant neoplasm of brain: Secondary | ICD-10-CM | POA: Diagnosis not present

## 2023-02-24 LAB — RAD ONC ARIA SESSION SUMMARY
Course Elapsed Days: 9
Plan Fractions Treated to Date: 8
Plan Prescribed Dose Per Fraction: 2.5 Gy
Plan Total Fractions Prescribed: 14
Plan Total Prescribed Dose: 35 Gy
Reference Point Dosage Given to Date: 20 Gy
Reference Point Session Dosage Given: 2.5 Gy
Session Number: 8

## 2023-02-25 ENCOUNTER — Other Ambulatory Visit: Payer: Self-pay

## 2023-02-25 ENCOUNTER — Ambulatory Visit
Admission: RE | Admit: 2023-02-25 | Discharge: 2023-02-25 | Disposition: A | Discharge status: Home / Self Care | Payer: Medicare Other | Source: Ambulatory Visit | Attending: Radiation Oncology | Admitting: Radiation Oncology

## 2023-02-25 DIAGNOSIS — C7931 Secondary malignant neoplasm of brain: Secondary | ICD-10-CM | POA: Diagnosis not present

## 2023-02-25 LAB — RAD ONC ARIA SESSION SUMMARY
Course Elapsed Days: 10
Plan Fractions Treated to Date: 9
Plan Prescribed Dose Per Fraction: 2.5 Gy
Plan Total Fractions Prescribed: 14
Plan Total Prescribed Dose: 35 Gy
Reference Point Dosage Given to Date: 22.5 Gy
Reference Point Session Dosage Given: 2.5 Gy
Session Number: 9

## 2023-02-28 ENCOUNTER — Other Ambulatory Visit: Payer: Self-pay

## 2023-02-28 ENCOUNTER — Ambulatory Visit
Admission: RE | Admit: 2023-02-28 | Discharge: 2023-02-28 | Disposition: A | Discharge status: Home / Self Care | Payer: Medicare Other | Source: Ambulatory Visit | Attending: Radiation Oncology | Admitting: Radiation Oncology

## 2023-02-28 DIAGNOSIS — C3412 Malignant neoplasm of upper lobe, left bronchus or lung: Secondary | ICD-10-CM | POA: Diagnosis not present

## 2023-02-28 DIAGNOSIS — Z2989 Encounter for other specified prophylactic measures: Secondary | ICD-10-CM | POA: Diagnosis not present

## 2023-02-28 DIAGNOSIS — Z510 Encounter for antineoplastic radiation therapy: Secondary | ICD-10-CM | POA: Diagnosis not present

## 2023-02-28 DIAGNOSIS — C7931 Secondary malignant neoplasm of brain: Secondary | ICD-10-CM | POA: Diagnosis not present

## 2023-02-28 DIAGNOSIS — Z87891 Personal history of nicotine dependence: Secondary | ICD-10-CM | POA: Diagnosis not present

## 2023-02-28 LAB — RAD ONC ARIA SESSION SUMMARY
Course Elapsed Days: 13
Plan Fractions Treated to Date: 10
Plan Prescribed Dose Per Fraction: 2.5 Gy
Plan Total Fractions Prescribed: 14
Plan Total Prescribed Dose: 35 Gy
Reference Point Dosage Given to Date: 25 Gy
Reference Point Session Dosage Given: 2.5 Gy
Session Number: 10

## 2023-03-01 ENCOUNTER — Other Ambulatory Visit: Payer: Self-pay

## 2023-03-01 ENCOUNTER — Ambulatory Visit
Admission: RE | Admit: 2023-03-01 | Discharge: 2023-03-01 | Disposition: A | Discharge status: Home / Self Care | Payer: Medicare Other | Source: Ambulatory Visit | Attending: Radiation Oncology | Admitting: Radiation Oncology

## 2023-03-01 ENCOUNTER — Ambulatory Visit: Payer: Medicare Other

## 2023-03-01 ENCOUNTER — Other Ambulatory Visit: Payer: Self-pay | Admitting: Internal Medicine

## 2023-03-01 DIAGNOSIS — C7931 Secondary malignant neoplasm of brain: Secondary | ICD-10-CM | POA: Diagnosis not present

## 2023-03-01 LAB — RAD ONC ARIA SESSION SUMMARY
Course Elapsed Days: 14
Plan Fractions Treated to Date: 11
Plan Prescribed Dose Per Fraction: 2.5 Gy
Plan Total Fractions Prescribed: 14
Plan Total Prescribed Dose: 35 Gy
Reference Point Dosage Given to Date: 27.5 Gy
Reference Point Session Dosage Given: 2.5 Gy
Session Number: 11

## 2023-03-02 ENCOUNTER — Ambulatory Visit
Admission: RE | Admit: 2023-03-02 | Discharge: 2023-03-02 | Disposition: A | Discharge status: Home / Self Care | Payer: Medicare Other | Source: Ambulatory Visit | Attending: Radiation Oncology | Admitting: Radiation Oncology

## 2023-03-02 ENCOUNTER — Other Ambulatory Visit: Payer: Self-pay

## 2023-03-02 DIAGNOSIS — C7931 Secondary malignant neoplasm of brain: Secondary | ICD-10-CM | POA: Diagnosis not present

## 2023-03-02 LAB — RAD ONC ARIA SESSION SUMMARY
Course Elapsed Days: 15
Plan Fractions Treated to Date: 12
Plan Prescribed Dose Per Fraction: 2.5 Gy
Plan Total Fractions Prescribed: 14
Plan Total Prescribed Dose: 35 Gy
Reference Point Dosage Given to Date: 30 Gy
Reference Point Session Dosage Given: 2.5 Gy
Session Number: 12

## 2023-03-03 ENCOUNTER — Ambulatory Visit
Admission: RE | Admit: 2023-03-03 | Discharge: 2023-03-03 | Disposition: A | Discharge status: Home / Self Care | Payer: Medicare Other | Source: Ambulatory Visit | Attending: Radiation Oncology | Admitting: Radiation Oncology

## 2023-03-03 ENCOUNTER — Other Ambulatory Visit: Payer: Self-pay

## 2023-03-03 DIAGNOSIS — C7931 Secondary malignant neoplasm of brain: Secondary | ICD-10-CM | POA: Diagnosis not present

## 2023-03-03 LAB — RAD ONC ARIA SESSION SUMMARY
Course Elapsed Days: 16
Plan Fractions Treated to Date: 13
Plan Prescribed Dose Per Fraction: 2.5 Gy
Plan Total Fractions Prescribed: 14
Plan Total Prescribed Dose: 35 Gy
Reference Point Dosage Given to Date: 32.5 Gy
Reference Point Session Dosage Given: 2.5 Gy
Session Number: 13

## 2023-03-04 ENCOUNTER — Other Ambulatory Visit: Payer: Self-pay

## 2023-03-04 ENCOUNTER — Ambulatory Visit
Admission: RE | Admit: 2023-03-04 | Discharge: 2023-03-04 | Disposition: A | Discharge status: Home / Self Care | Payer: Medicare Other | Source: Ambulatory Visit | Attending: Radiation Oncology | Admitting: Radiation Oncology

## 2023-03-04 DIAGNOSIS — Z87891 Personal history of nicotine dependence: Secondary | ICD-10-CM | POA: Diagnosis not present

## 2023-03-04 DIAGNOSIS — Z510 Encounter for antineoplastic radiation therapy: Secondary | ICD-10-CM | POA: Diagnosis not present

## 2023-03-04 DIAGNOSIS — C7931 Secondary malignant neoplasm of brain: Secondary | ICD-10-CM | POA: Diagnosis not present

## 2023-03-04 DIAGNOSIS — C3412 Malignant neoplasm of upper lobe, left bronchus or lung: Secondary | ICD-10-CM | POA: Diagnosis not present

## 2023-03-04 DIAGNOSIS — Z2989 Encounter for other specified prophylactic measures: Secondary | ICD-10-CM | POA: Diagnosis not present

## 2023-03-04 LAB — RAD ONC ARIA SESSION SUMMARY
Course Elapsed Days: 17
Plan Fractions Treated to Date: 14
Plan Prescribed Dose Per Fraction: 2.5 Gy
Plan Total Fractions Prescribed: 14
Plan Total Prescribed Dose: 35 Gy
Reference Point Dosage Given to Date: 35 Gy
Reference Point Session Dosage Given: 2.5 Gy
Session Number: 14

## 2023-03-14 ENCOUNTER — Inpatient Hospital Stay: Payer: Medicare Other | Attending: Hematology

## 2023-03-14 ENCOUNTER — Encounter (HOSPITAL_COMMUNITY): Payer: Self-pay

## 2023-03-14 ENCOUNTER — Ambulatory Visit (HOSPITAL_COMMUNITY)
Admission: RE | Admit: 2023-03-14 | Discharge: 2023-03-14 | Disposition: A | Discharge status: Home / Self Care | Payer: Medicare Other | Source: Ambulatory Visit | Attending: Hematology | Admitting: Hematology

## 2023-03-14 DIAGNOSIS — C3412 Malignant neoplasm of upper lobe, left bronchus or lung: Secondary | ICD-10-CM | POA: Insufficient documentation

## 2023-03-14 DIAGNOSIS — C770 Secondary and unspecified malignant neoplasm of lymph nodes of head, face and neck: Secondary | ICD-10-CM | POA: Insufficient documentation

## 2023-03-14 DIAGNOSIS — C3490 Malignant neoplasm of unspecified part of unspecified bronchus or lung: Secondary | ICD-10-CM

## 2023-03-14 DIAGNOSIS — J439 Emphysema, unspecified: Secondary | ICD-10-CM | POA: Diagnosis not present

## 2023-03-14 DIAGNOSIS — J449 Chronic obstructive pulmonary disease, unspecified: Secondary | ICD-10-CM | POA: Diagnosis not present

## 2023-03-14 DIAGNOSIS — D751 Secondary polycythemia: Secondary | ICD-10-CM

## 2023-03-14 DIAGNOSIS — Z79811 Long term (current) use of aromatase inhibitors: Secondary | ICD-10-CM | POA: Insufficient documentation

## 2023-03-14 DIAGNOSIS — Z170 Estrogen receptor positive status [ER+]: Secondary | ICD-10-CM | POA: Insufficient documentation

## 2023-03-14 DIAGNOSIS — Z95828 Presence of other vascular implants and grafts: Secondary | ICD-10-CM

## 2023-03-14 DIAGNOSIS — C50212 Malignant neoplasm of upper-inner quadrant of left female breast: Secondary | ICD-10-CM | POA: Insufficient documentation

## 2023-03-14 LAB — CMP (CANCER CENTER ONLY)
ALT: 8 U/L (ref 0–44)
AST: 15 U/L (ref 15–41)
Albumin: 4.2 g/dL (ref 3.5–5.0)
Alkaline Phosphatase: 46 U/L (ref 38–126)
Anion gap: 6 (ref 5–15)
BUN: 15 mg/dL (ref 8–23)
CO2: 29 mmol/L (ref 22–32)
Calcium: 9.7 mg/dL (ref 8.9–10.3)
Chloride: 102 mmol/L (ref 98–111)
Creatinine: 0.4 mg/dL — ABNORMAL LOW (ref 0.44–1.00)
GFR, Estimated: >60 mL/min (ref >60)
Glucose, Bld: 119 mg/dL — ABNORMAL HIGH (ref 70–99)
Potassium: 3.9 mmol/L (ref 3.5–5.1)
Sodium: 137 mmol/L (ref 135–145)
Total Bilirubin: 0.5 mg/dL (ref 0.3–1.2)
Total Protein: 6.8 g/dL (ref 6.5–8.1)

## 2023-03-14 LAB — CBC WITH DIFFERENTIAL (CANCER CENTER ONLY)
Abs Immature Granulocytes: 0.02 10*3/uL (ref 0.00–0.07)
Basophils Absolute: 0 10*3/uL (ref 0.0–0.1)
Basophils Relative: 0 %
Eosinophils Absolute: 0.2 10*3/uL (ref 0.0–0.5)
Eosinophils Relative: 4 %
HCT: 41.1 % (ref 36.0–46.0)
Hemoglobin: 12.6 g/dL (ref 12.0–15.0)
Immature Granulocytes: 1 %
Lymphocytes Relative: 11 %
Lymphs Abs: 0.4 10*3/uL — ABNORMAL LOW (ref 0.7–4.0)
MCH: 22.1 pg — ABNORMAL LOW (ref 26.0–34.0)
MCHC: 30.7 g/dL (ref 30.0–36.0)
MCV: 72.2 fL — ABNORMAL LOW (ref 80.0–100.0)
Monocytes Absolute: 0.3 10*3/uL (ref 0.1–1.0)
Monocytes Relative: 7 %
Neutro Abs: 3.1 10*3/uL (ref 1.7–7.7)
Neutrophils Relative %: 77 %
Platelet Count: 99 10*3/uL — ABNORMAL LOW (ref 150–400)
RBC: 5.69 MIL/uL — ABNORMAL HIGH (ref 3.87–5.11)
RDW: 18.6 % — ABNORMAL HIGH (ref 11.5–15.5)
WBC Count: 4 10*3/uL (ref 4.0–10.5)
nRBC: 0 % (ref 0.0–0.2)

## 2023-03-14 LAB — FERRITIN: Ferritin: 9 ng/mL — ABNORMAL LOW (ref 11–307)

## 2023-03-14 LAB — SAMPLE TO BLOOD BANK

## 2023-03-14 MED ORDER — SODIUM CHLORIDE 0.9% FLUSH
10.0000 mL | Freq: Once | INTRAVENOUS | Status: AC
  Administered 2023-03-14: 10 mL

## 2023-03-14 MED ORDER — IOHEXOL 300 MG/ML  SOLN
75.0000 mL | Freq: Once | INTRAMUSCULAR | Status: AC | PRN
  Administered 2023-03-14: 75 mL via INTRAVENOUS

## 2023-03-14 MED ORDER — SODIUM CHLORIDE (PF) 0.9 % IJ SOLN
INTRAMUSCULAR | Status: AC
  Filled 2023-03-14: qty 50

## 2023-03-14 MED ORDER — HEPARIN SOD (PORK) LOCK FLUSH 100 UNIT/ML IV SOLN
500.0000 [IU] | Freq: Once | INTRAVENOUS | Status: AC
  Administered 2023-03-14: 500 [IU] via INTRAVENOUS

## 2023-03-14 MED ORDER — HEPARIN SOD (PORK) LOCK FLUSH 100 UNIT/ML IV SOLN
INTRAVENOUS | Status: AC
  Filled 2023-03-14: qty 5

## 2023-03-15 NOTE — Radiation Completion Notes (Addendum)
  Radiation Oncology         [phone] ________________________________  Name: KERRIANN SCHNACKENBERG MRN: [id number]  Date: 03/04/2023  DOB: 1946/07/23  End of Treatment Note   Patient Name: MCKINNA, MAPES MRN: [id number] Date of Birth: 1946/10/02 Referring Physician: Malachy Mood, M.D. Date of Service: 2023-03-15 Radiation Oncologist: Margaretmary Bayley, M.D. [facility]     RADIATION ONCOLOGY END OF TREATMENT NOTE     Diagnosis: 77 year old female with small cell lung cancer in the LUL lung and 2 small brain metastases noted on recent post-treatment MRI brain.   Intent: Palliative     ==========DELIVERED PLANS==========  First Treatment Date: 2023-02-15 - Last Treatment Date: 2023-03-04   Plan Name: Brain Site: Brain Technique: Isodose Plan Mode: Photon Dose Per Fraction: 2.5 Gy Prescribed Dose (Delivered / Prescribed): 35 Gy / 35 Gy Prescribed Fxs (Delivered / Prescribed): 14 / 14     ==========ON TREATMENT VISIT DATES========== 2023-02-17, 2023-02-25, 2023-03-03     See weekly On Treatment Notes in Epic for details. The patient tolerated radiation treatment relatively well.   She did experience some mild headache, nausea/vomiting and modest fatigue.  The patient will receive a call in about one month from the radiation oncology department. She will continue follow up with her medical oncologist, Dr. Mosetta Putt as well.  ------------------------------------------------   Margaretmary Dys, MD [facility]  Radiation Oncology Direct Dial: [phone]  Fax: [phone] [email or web address]  Skype  LinkedIn

## 2023-03-16 DIAGNOSIS — M8588 Other specified disorders of bone density and structure, other site: Secondary | ICD-10-CM | POA: Diagnosis not present

## 2023-03-16 DIAGNOSIS — Z853 Personal history of malignant neoplasm of breast: Secondary | ICD-10-CM | POA: Diagnosis not present

## 2023-03-16 DIAGNOSIS — J449 Chronic obstructive pulmonary disease, unspecified: Secondary | ICD-10-CM | POA: Diagnosis not present

## 2023-03-16 DIAGNOSIS — E119 Type 2 diabetes mellitus without complications: Secondary | ICD-10-CM | POA: Diagnosis not present

## 2023-03-16 DIAGNOSIS — Z1231 Encounter for screening mammogram for malignant neoplasm of breast: Secondary | ICD-10-CM | POA: Diagnosis not present

## 2023-03-17 ENCOUNTER — Encounter: Payer: Self-pay | Admitting: Hematology

## 2023-03-17 ENCOUNTER — Inpatient Hospital Stay: Payer: Medicare Other | Admitting: Hematology

## 2023-03-17 ENCOUNTER — Other Ambulatory Visit: Payer: Self-pay

## 2023-03-17 VITALS — BP 100/60 | HR 70 | Temp 97.4°F | Resp 18 | Ht 65.0 in | Wt 94.3 lb

## 2023-03-17 DIAGNOSIS — C770 Secondary and unspecified malignant neoplasm of lymph nodes of head, face and neck: Secondary | ICD-10-CM | POA: Diagnosis not present

## 2023-03-17 DIAGNOSIS — C50212 Malignant neoplasm of upper-inner quadrant of left female breast: Secondary | ICD-10-CM | POA: Diagnosis not present

## 2023-03-17 DIAGNOSIS — Z79811 Long term (current) use of aromatase inhibitors: Secondary | ICD-10-CM | POA: Diagnosis not present

## 2023-03-17 DIAGNOSIS — C3412 Malignant neoplasm of upper lobe, left bronchus or lung: Secondary | ICD-10-CM | POA: Diagnosis not present

## 2023-03-17 DIAGNOSIS — Z170 Estrogen receptor positive status [ER+]: Secondary | ICD-10-CM | POA: Diagnosis not present

## 2023-03-17 DIAGNOSIS — C3490 Malignant neoplasm of unspecified part of unspecified bronchus or lung: Secondary | ICD-10-CM | POA: Diagnosis not present

## 2023-03-17 MED ORDER — NICOTINE 7 MG/24HR TD PT24: 7.0000 mg | MEDICATED_PATCH | Freq: Every day | TRANSDERMAL | 0 refills | Status: DC

## 2023-03-17 NOTE — Progress Notes (Signed)
[facility]   [phone] [phone]   Clinic Follow up Note   Patient Care Team: Georgianne Fick, MD as PCP - General (Internal Medicine) Rollene Rotunda, MD as PCP - Cardiology (Cardiology) Claud Kelp, MD as Consulting Physician (General Surgery) Malachy Mood, MD as Consulting Physician (Hematology) Dorothy Puffer, MD as Consulting Physician (Radiation Oncology) Loa Socks, NP as Nurse Practitioner (Hematology and Oncology)  Date of Service:  03/17/2023  CHIEF COMPLAINT: f/u of small cell lung cancer   CURRENT THERAPY:  Cancer Surveillance  ASSESSMENT:  Monica [name] is a 77 y.o. female with   Small cell lung cancer (HCC) -[id number], stage III, limited stage  -found on screening CT chest on July 22, 2022 which showed suprahilar left upper lobe pulmonary mass and mediastinal adenopathy -PET scan from August 08, 2022 showed hypermetabolic left hilar lung mass with mediastinal and left supraclavicular nodal metastasis. 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 Brain MRI negative  -started concurrent chemo cisplatin/etoposide and radiation 09/29/2022, completed RT on 11/15/2022 -she completed 4 cycle chemo on 12/06/22 -she finished prophylactic brain radiation on 03/04/2023. -She is recovering well from chemoradiation, I encouraged her to continue high-protein high calorie diet, and stay active -Restaging CT scan from March 14, 2023 showed near complete response with minimal soft tissue in the left hilar, resolved mediastinal adenopathy.  No other evidence of recurrence. -She is very fatigued from her recent brain radiation, I encouraged her to have high-protein diet and exercise. -We discussed that she has very high risk of recurrence, will monitor closely, repeat in next CT chest, abdomen pelvis in 4 months.  Smoking cessation -We again discussed smoking cessation, and I called  in nicotine patch for her today.  Iron deficiency -No overt GI bleeding, hemoglobin normal. -I recommend over-the-counter prenatal vitamin, may consider IV iron if she does not tolerate or has persistent low iron. -Last EGD and colonoscopy in 2022, may need repeated GI workup.  PLAN: -lab reviewed -Reviewed CT SCAN with pt-excellent response -I prescribe Nicotine patch  -recommend prenatal vitamins for Iron -repeat scan in 4-6 months -repeat brain MRI in 6 months -will order CT scan next visit -lab and f/u in 2 months  SUMMARY OF ONCOLOGIC HISTORY: Oncology History Overview Note  Cancer Staging Malignant neoplasm of upper-inner quadrant of left breast in female, estrogen receptor positive (HCC) Staging form: Breast, AJCC 8th Edition - Clinical stage from 02/16/2017: Stage IA (cT1b, cN0, cM0, G2, ER: Positive, PR: Positive, HER2: Negative) - Unsigned Staging comments: Staged at breast conference  - Pathologic stage from 03/16/2017: Stage IA (pT1b, pN0, cM0, G2, ER: Positive, PR: Positive, HER2: Negative, Oncotype DX score: 27) - Signed by Malachy Mood, MD on 04/24/2017     Malignant neoplasm of upper-inner quadrant of left breast in female, estrogen receptor positive (HCC)  02/09/2017 Initial Diagnosis   Malignant neoplasm of upper-inner quadrant of left breast in female, estrogen receptor positive (HCC)   02/09/2017 Initial Biopsy   Diagnosis Breast, left, needle core biopsy - INVASIVE DUCTAL CARCINOMA, 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 Derrell Lolling    03/16/2017 Pathology Results   PATHOLOGY REPORT Diagnosis 03/16/17  1. Breast, lumpectomy, Left -  INVASIVE DUCTAL CARCINOMA, NOTTINGHAM GRADE 2 OF 3, 0.9 CM - DUCTAL CARCINOMA IN SITU - MARGINS UNINVOLVED BY CARCINOMA (0.3 CM POSTERIOR MARGIN) - PREVIOUS BIOPSY SITE CHANGES - SEE ONCOLOGY TABLE BELOW 2. Lymph node, sentinel, biopsy, Left axillary #1 - NO CARCINOMA IDENTIFIED IN ONE LYMPH NODE (0/1) 3. Lymph node, sentinel, biopsy, Left axillary #2 - NO CARCINOMA IDENTIFIED IN ONE LYMPH NODE (0/1) 4. Lymph node, sentinel, biopsy, Left axillary #3 - NO CARCINOMA IDENTIFIED IN ONE LYMPH NODE (0/1) 5. Lymph node, sentinel, biopsy, Left axillary #4 - NO CARCINOMA IDENTIFIED IN ONE LYMPH NODE (0/1)    03/16/2017 Oncotype testing   Recurrance score of 27 with a 18% distance recurrance in the net 10 years with Tamoxifen alone    05/19/2017 - 06/16/2017 Radiation Therapy   Radiation with Dr. Mitzi Hansen   06/2017 -  Anti-estrogen oral therapy   anastrozole 1 mg once a day starting late 06/2017     04/29/2020 Pathology Results   DIAGNOSIS:   BONE MARROW, ASPIRATE, CLOT, CORE:  -  Cellular marrow with trilineage hematopoiesis  -  No morphologic evidence of carcinoma or hematopoietic neoplasm  -  See microscopic description below   PERIPHERAL BLOOD:  -  Polycythemia and thrombocytopenia  -  See complete blood cell count   MICROSCOPIC DESCRIPTION:   PERIPHERAL BLOOD SMEAR: The peripheral blood has a polycythemia and  thrombocytopenia.  Leukocytes are morphologically unremarkable.   BONE MARROW ASPIRATE: Spicular, cellular and adequate for evaluation  Erythroid precursors: Orderly maturation without overt dysplasia  Granulocytic precursors: Orderly maturation without overt dysplasia  Megakaryocytes: Qualitatively and quantitatively unremarkable  Lymphocytes/plasma cells: No lymphocytosis or plasmacytosis    Small cell lung cancer (HCC)  09/14/2022 Cancer Staging   Staging form: Lung, AJCC 8th Edition - Clinical stage from 09/14/2022: Stage IIIB (cT2, cN3, cM0) - Signed by Malachy Mood, MD on  09/16/2022   09/16/2022 Initial Diagnosis   Small cell lung cancer (HCC)   09/29/2022 -  Chemotherapy   Patient is on Treatment Plan : LUNG SMALL CELL Cisplatin (80) D1 + Etoposide (100) D1-3 q21d     03/14/2023 Imaging    IMPRESSION: 1. Marked interval decreased size of left hilar mass with mild ill-defined left hilar soft tissue seen on today's exam. 2. Hypermetabolic prevascular and left supraclavicular lymph nodes seen on prior PET-CT are no longer visible. No evidence of progressive metastatic disease in the chest. 3. Coronary artery calcifications, aortic Atherosclerosis (ICD10-I70.0) and Emphysema (ICD10-J43.9).        INTERVAL HISTORY:  Monica [name] is here for a follow up of small cell lung cancer. She was last seen by me on 01/17/2023. She presents to the clinic accompanied by daughter. Pt state that she has fatigue from radiation and some nausea, some memory loss. Pt state that she eats a  little but snack a lot .     All other systems were reviewed with the patient and are negative.  MEDICAL HISTORY:  Past Medical History:  Diagnosis Date   CAD (coronary artery disease)    Cypher stent 09/2002   Cancer (HCC) 2018   left breast, radiation therapy   CHF (congestive heart failure) (HCC)    Depression    Diabetes mellitus (HCC)    Hyperlipidemia    Hypertension    Myocardial infarction (HCC)    2003,went into cardiac shock   Osteoporosis    Seizures (HCC)    1st seizure age late 57's;  most recent 07/28/17   Sinusitis     SURGICAL HISTORY: Past Surgical History:  Procedure Laterality Date   ABDOMINAL HYSTERECTOMY     APPENDECTOMY     BREAST LUMPECTOMY WITH RADIOACTIVE SEED AND SENTINEL LYMPH NODE BIOPSY Left 03/16/2017   Procedure: INJECT BLUE DYE LEFT BREAST, LEFT BREAST LUMPECTOMY WITH RADIOACTIVE SEED AND LEFT AXILLARY DEEP SENTINEL LYMPH NODE  BIOPSY ADJACENT TISSUE TRANSFER;  Surgeon: Claud Kelp, MD;  Location: [facility] OR;  Service: General;   Laterality: Left;   CARPAL TUNNEL RELEASE Bilateral    COLONOSCOPY WITH PROPOFOL N/A 05/30/2015   Procedure: COLONOSCOPY WITH PROPOFOL;  Surgeon: Jeani Hawking, MD;  Location: [facility];  Service: Endoscopy;  Laterality: N/A;   COLONOSCOPY WITH PROPOFOL N/A 06/09/2018   Procedure: COLONOSCOPY WITH PROPOFOL;  Surgeon: Jeani Hawking, MD;  Location: [facility];  Service: Endoscopy;  Laterality: N/A;   COLONOSCOPY WITH PROPOFOL N/A 07/31/2021   Procedure: COLONOSCOPY WITH PROPOFOL;  Surgeon: Jeani Hawking, MD;  Location: [facility];  Service: Endoscopy;  Laterality: N/A;   CORONARY ANGIOPLASTY WITH STENT PLACEMENT  10-02-2002   ESOPHAGOGASTRODUODENOSCOPY (EGD) WITH PROPOFOL N/A 07/31/2021   Procedure: ESOPHAGOGASTRODUODENOSCOPY (EGD) WITH PROPOFOL;  Surgeon: Jeani Hawking, MD;  Location: [facility];  Service: Endoscopy;  Laterality: N/A;   Exploratory abdominal     Bleeding in abdomen after tubal   IR IMAGING GUIDED PORT INSERTION  09/28/2022   POLYPECTOMY  06/09/2018   Procedure: POLYPECTOMY;  Surgeon: Jeani Hawking, MD;  Location: [facility];  Service: Endoscopy;;   POLYPECTOMY  07/31/2021   Procedure: POLYPECTOMY;  Surgeon: Jeani Hawking, MD;  Location: [facility];  Service: Endoscopy;;   TUBAL LIGATION     UMBILICAL HERNIA REPAIR      I have reviewed the social history and family history with the patient and they are unchanged from previous note.  ALLERGIES:  is allergic to ace inhibitors, keflex [cephalexin], phenobarbital, sulfate, altace [ramipril], codeine, and dicyclomine hcl.  MEDICATIONS:  Current Outpatient Medications  Medication Sig Dispense Refill   nicotine (NICODERM CQ - DOSED IN MG/24 HR) 7 mg/24hr patch Place 1 patch (7 mg total) onto the skin daily. 28 patch 0   acetaminophen (TYLENOL) 500 MG tablet Take 1,000 mg by mouth every 6 (six) hours as needed for moderate pain or headache.     albuterol (VENTOLIN HFA) 108 (90 Base) MCG/ACT inhaler INHALE 2 PUFFS BY  MOUTH EVERY 6 HOURS AS NEEDED FOR WHEEZING OR  SHORTHNESS  OF  BREATH 9 g 11   anastrozole (ARIMIDEX) 1 MG tablet Take 1 tablet (1 mg total) by mouth daily. 90 tablet 1   aspirin EC 81 MG tablet Take 81 mg by mouth at bedtime.     atorvastatin (LIPITOR) 40 MG tablet Take 40 mg by mouth at bedtime.     B-D ULTRAFINE III SHORT PEN 31G X 8 MM MISC Inject into the skin as directed.     Calcium Carbonate-Vitamin D (CALTRATE 600+D PO) Take 1 tablet by mouth in the morning and at bedtime.     carvedilol (COREG) 3.125 MG tablet Take 3.125 mg by mouth 2 (two) times daily.      cetirizine (ZYRTEC) 10 MG tablet Take 10 mg by mouth daily as needed for allergies.     clonazePAM (KLONOPIN) 1 MG tablet Take 1 mg by mouth in the morning and at bedtime.     Insulin Detemir (LEVEMIR) 100 UNIT/ML Pen Inject 17-25 Units into the skin See admin instructions. Inject 25 units subcutaneously in  the morning and inject 17 units subcutaneously at night     insulin lispro (HUMALOG) 100 UNIT/ML KwikPen Inject 7-9 Units into the skin See admin instructions. Inject 7 units in the morning, 7 units at lunch, and 9 units at night     ketorolac (ACULAR) 0.5 % ophthalmic solution Place 1 drop into the left eye 4 (four) times daily.     levETIRAcetam (KEPPRA) 500 MG tablet Take 1 tablet (500 mg total) by mouth 2 (two) times daily. 180 tablet 0   lidocaine-prilocaine (EMLA) cream Apply to affected area once 30 g 3   losartan (COZAAR) 50 MG tablet Take 25 mg by mouth at bedtime.     MAGNESIUM-OXIDE 400 (240 Mg) MG tablet Take 1 tablet by mouth once daily 60 tablet 0   moxifloxacin (VIGAMOX) 0.5 % ophthalmic solution Place 1 drop into the left eye 4 (four) times daily.     nitroGLYCERIN (NITROSTAT) 0.4 MG SL tablet Place 1 tablet (0.4 mg total) under the tongue every 5 (five) minutes as needed for chest pain. 25 tablet 2   Omega-3 Fatty Acids (FISH OIL) 1000 MG CAPS Take 1,000 mg by mouth in the morning and at bedtime.     omeprazole  (PRILOSEC) 20 MG capsule Take 20 mg by mouth in the morning.     ondansetron (ZOFRAN) 8 MG tablet Take 1 tablet (8 mg total) by mouth every 8 (eight) hours as needed for nausea or vomiting. Start on the third day after cisplatin. 30 tablet 1   ONETOUCH VERIO test strip USE THREE TIMES A DAY AND AS NEEDED FOR HYPOGLYCEMIA     OXYGEN Inhale 2 L into the lungs at bedtime.     pioglitazone (ACTOS) 15 MG tablet Take 15 mg by mouth at bedtime.     Polyethyl Glycol-Propyl Glycol 0.4-0.3 % SOLN Place 1 drop into both eyes 2 (two) times daily as needed (itchy/irritated eyes.).     prednisoLONE acetate (PRED FORTE) 1 % ophthalmic suspension Place 1 drop into the left eye 4 (four) times daily.     prochlorperazine (COMPAZINE) 10 MG tablet Take 1 tablet (10 mg total) by mouth every 6 (six) hours as needed for nausea or vomiting. 30 tablet 1   risedronate (ACTONEL) 150 MG tablet Take 150 mg by mouth every 30 (thirty) days.     sertraline (ZOLOFT) 100 MG tablet Take 100 mg by mouth at bedtime.     TRELEGY ELLIPTA 100-62.5-25 MCG/ACT AEPB INHALE 1 PUFF ONCE DAILY RINSE MOUTH AFTER USE 60 each 0   TRIJARDY XR 12.5-2.02-999 MG TB24 Take 1 tablet by mouth 2 (two) times daily.     No current facility-administered medications for this visit.    PHYSICAL EXAMINATION: ECOG PERFORMANCE STATUS: 2 - Symptomatic, <50% confined to bed  Vitals:   03/17/23 1445  BP: 100/60  Pulse: 70  Resp: 18  Temp: (!) 97.4 F (36.3 C)  SpO2: 98%   Wt Readings from Last 3 Encounters:  03/17/23 94 lb 4.8 oz (42.8 kg)  01/20/23 101 lb (45.8 kg)  01/17/23 104 lb 6.4 oz (47.4 kg)     GENERAL:alert, no distress and comfortable SKIN: skin color normal, no rashes or significant lesions EYES: normal, Conjunctiva are pink and non-injected, sclera clear  NEURO: alert & oriented x 3 with fluent speech LABORATORY DATA:  I have reviewed the data as listed    Latest Ref Rng & Units 03/14/2023    9:19 AM 01/17/2023   10:05 AM 12/06/2022  7:52 AM  CBC  WBC 4.0 - 10.5 K/uL 4.0  4.8  8.3   Hemoglobin 12.0 - 15.0 g/dL 16.1  09.6  04.5   Hematocrit 36.0 - 46.0 % 41.1  34.6  33.6   Platelets 150 - 400 K/uL 99  101  196         Latest Ref Rng & Units 03/14/2023    9:19 AM 01/17/2023   10:05 AM 12/06/2022    7:52 AM  CMP  Glucose 70 - 99 mg/dL 409  811  49   BUN 8 - 23 mg/dL 15  15  7    Creatinine 0.44 - 1.00 mg/dL 9.14  7.82  9.56   Sodium 135 - 145 mmol/L 137  138  138   Potassium 3.5 - 5.1 mmol/L 3.9  3.6  3.8   Chloride 98 - 111 mmol/L 102  104  104   CO2 22 - 32 mmol/L 29  26  27    Calcium 8.9 - 10.3 mg/dL 9.7  9.4  8.5   Total Protein 6.5 - 8.1 g/dL 6.8  6.2  6.3   Total Bilirubin 0.3 - 1.2 mg/dL 0.5  0.3  0.3   Alkaline Phos 38 - 126 U/L 46  55  59   AST 15 - 41 U/L 15  12  13    ALT 0 - 44 U/L 8  10  10        RADIOGRAPHIC STUDIES: I have personally reviewed the radiological images as listed and agreed with the findings in the report. No results found.    No orders of the defined types were placed in this encounter.  All questions were answered. The patient knows to call the clinic with any problems, questions or concerns. No barriers to learning was detected. The total time spent in the appointment was 30 minutes.     Malachy Mood, MD 03/17/2023   Carolin Coy, CMA, am acting as scribe for Malachy Mood, MD.   I have reviewed the above documentation for accuracy and completeness, and I agree with the above.

## 2023-03-17 NOTE — Assessment & Plan Note (Signed)
-  [id number], stage III, limited stage  -found on screening CT chest on July 22, 2022 which showed suprahilar left upper lobe pulmonary mass and mediastinal adenopathy -PET scan from August 08, 2022 showed hypermetabolic left hilar lung mass with mediastinal and left supraclavicular nodal metastasis. 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 Brain MRI negative  -started concurrent chemo cisplatin/etoposide and radiation 09/29/2022, completed RT on 11/15/2022 -she completed 4 cycle chemo on 12/06/22 -she finished prophylactic brain radiation on 03/04/2023. -She is recovering well from chemoradiation, I encouraged her to continue high-protein high calorie diet, and stay active -Restaging CT scan from March 14, 2023 showed

## 2023-03-18 ENCOUNTER — Other Ambulatory Visit: Payer: Self-pay

## 2023-03-22 ENCOUNTER — Other Ambulatory Visit: Payer: Self-pay

## 2023-03-22 DIAGNOSIS — E1121 Type 2 diabetes mellitus with diabetic nephropathy: Secondary | ICD-10-CM | POA: Diagnosis not present

## 2023-03-22 DIAGNOSIS — I10 Essential (primary) hypertension: Secondary | ICD-10-CM | POA: Diagnosis not present

## 2023-03-22 DIAGNOSIS — I25111 Atherosclerotic heart disease of native coronary artery with angina pectoris with documented spasm: Secondary | ICD-10-CM | POA: Diagnosis not present

## 2023-03-22 DIAGNOSIS — Z794 Long term (current) use of insulin: Secondary | ICD-10-CM | POA: Diagnosis not present

## 2023-03-22 DIAGNOSIS — E7800 Pure hypercholesterolemia, unspecified: Secondary | ICD-10-CM | POA: Diagnosis not present

## 2023-03-24 IMAGING — DX DG CHEST 2V
2 series · 2 of 2 positions shown · non-contrast
Comparison: 12/26/2019

CLINICAL DATA: COPD

EXAM:
CHEST - 2 VIEW

[chest pa]
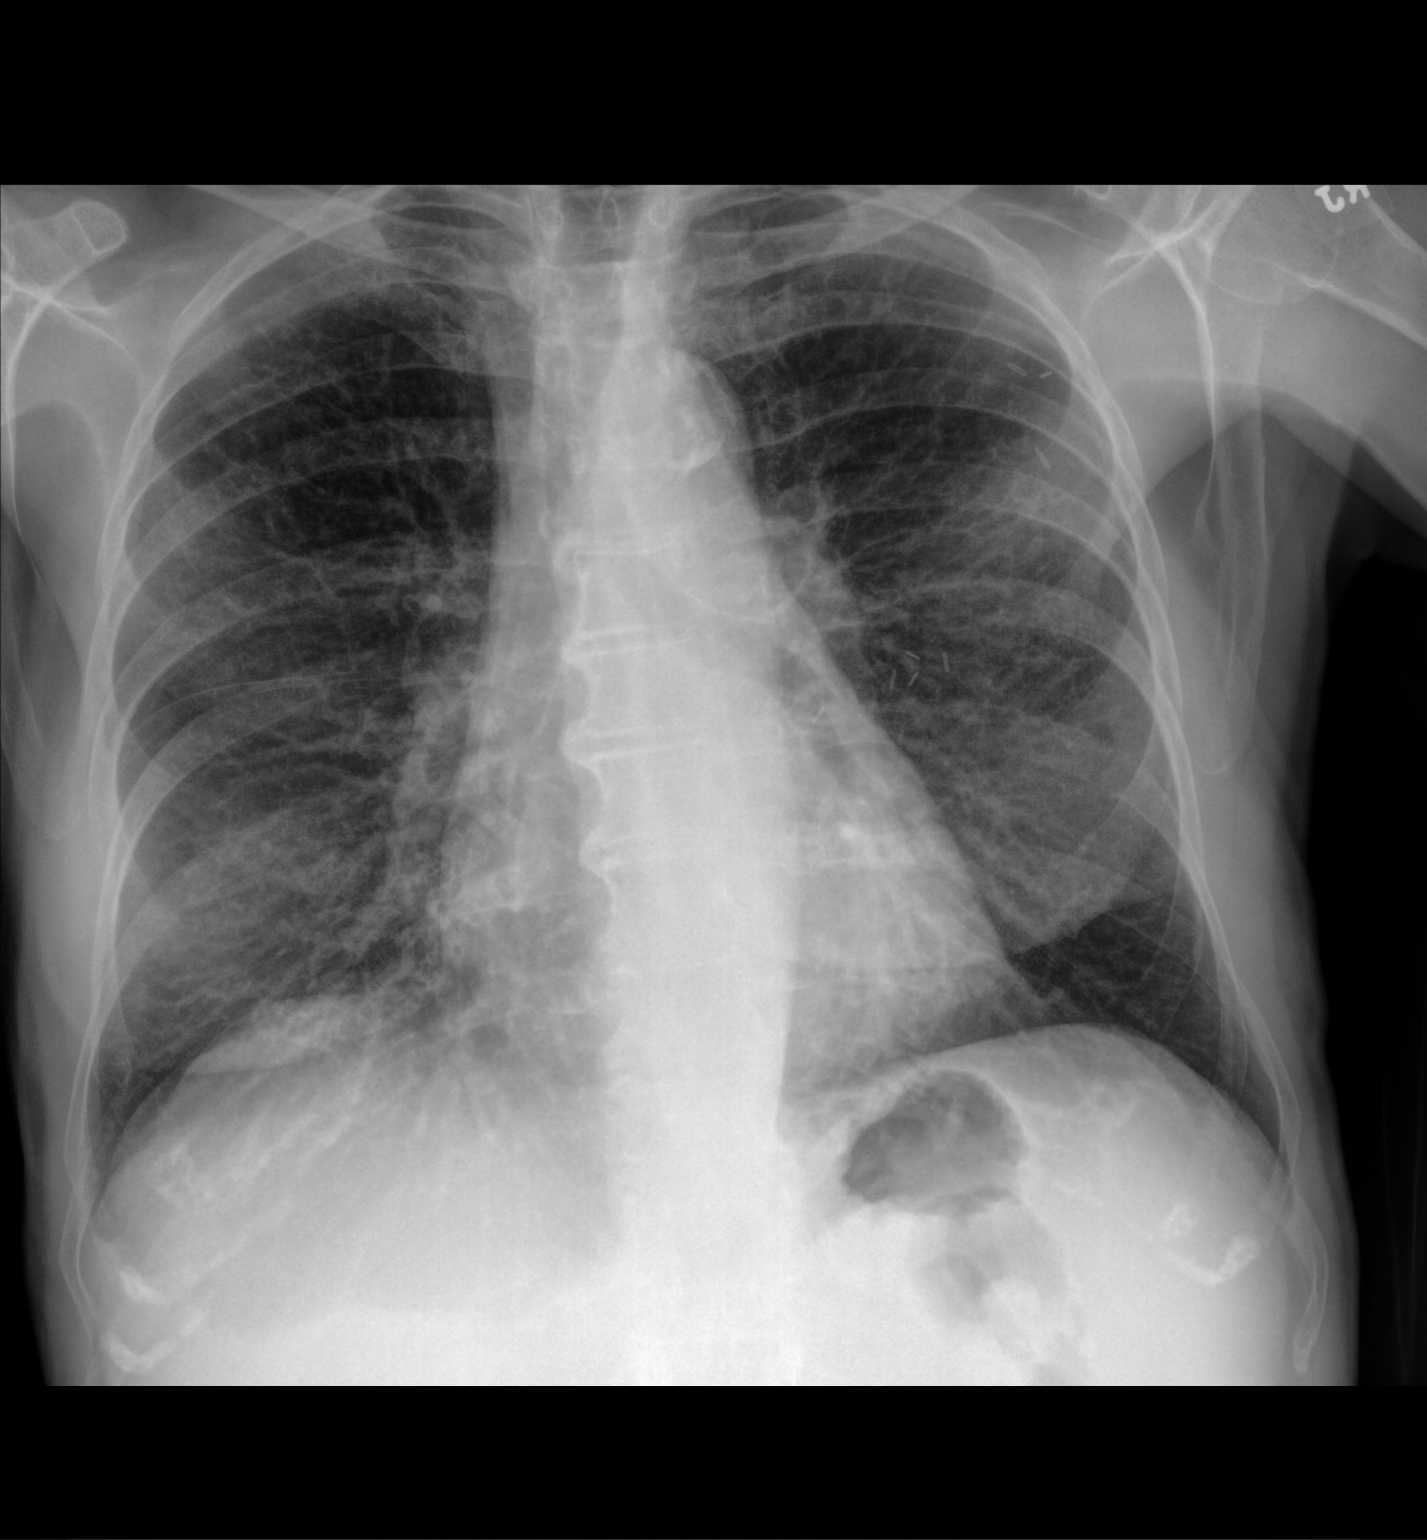

[chest lat]
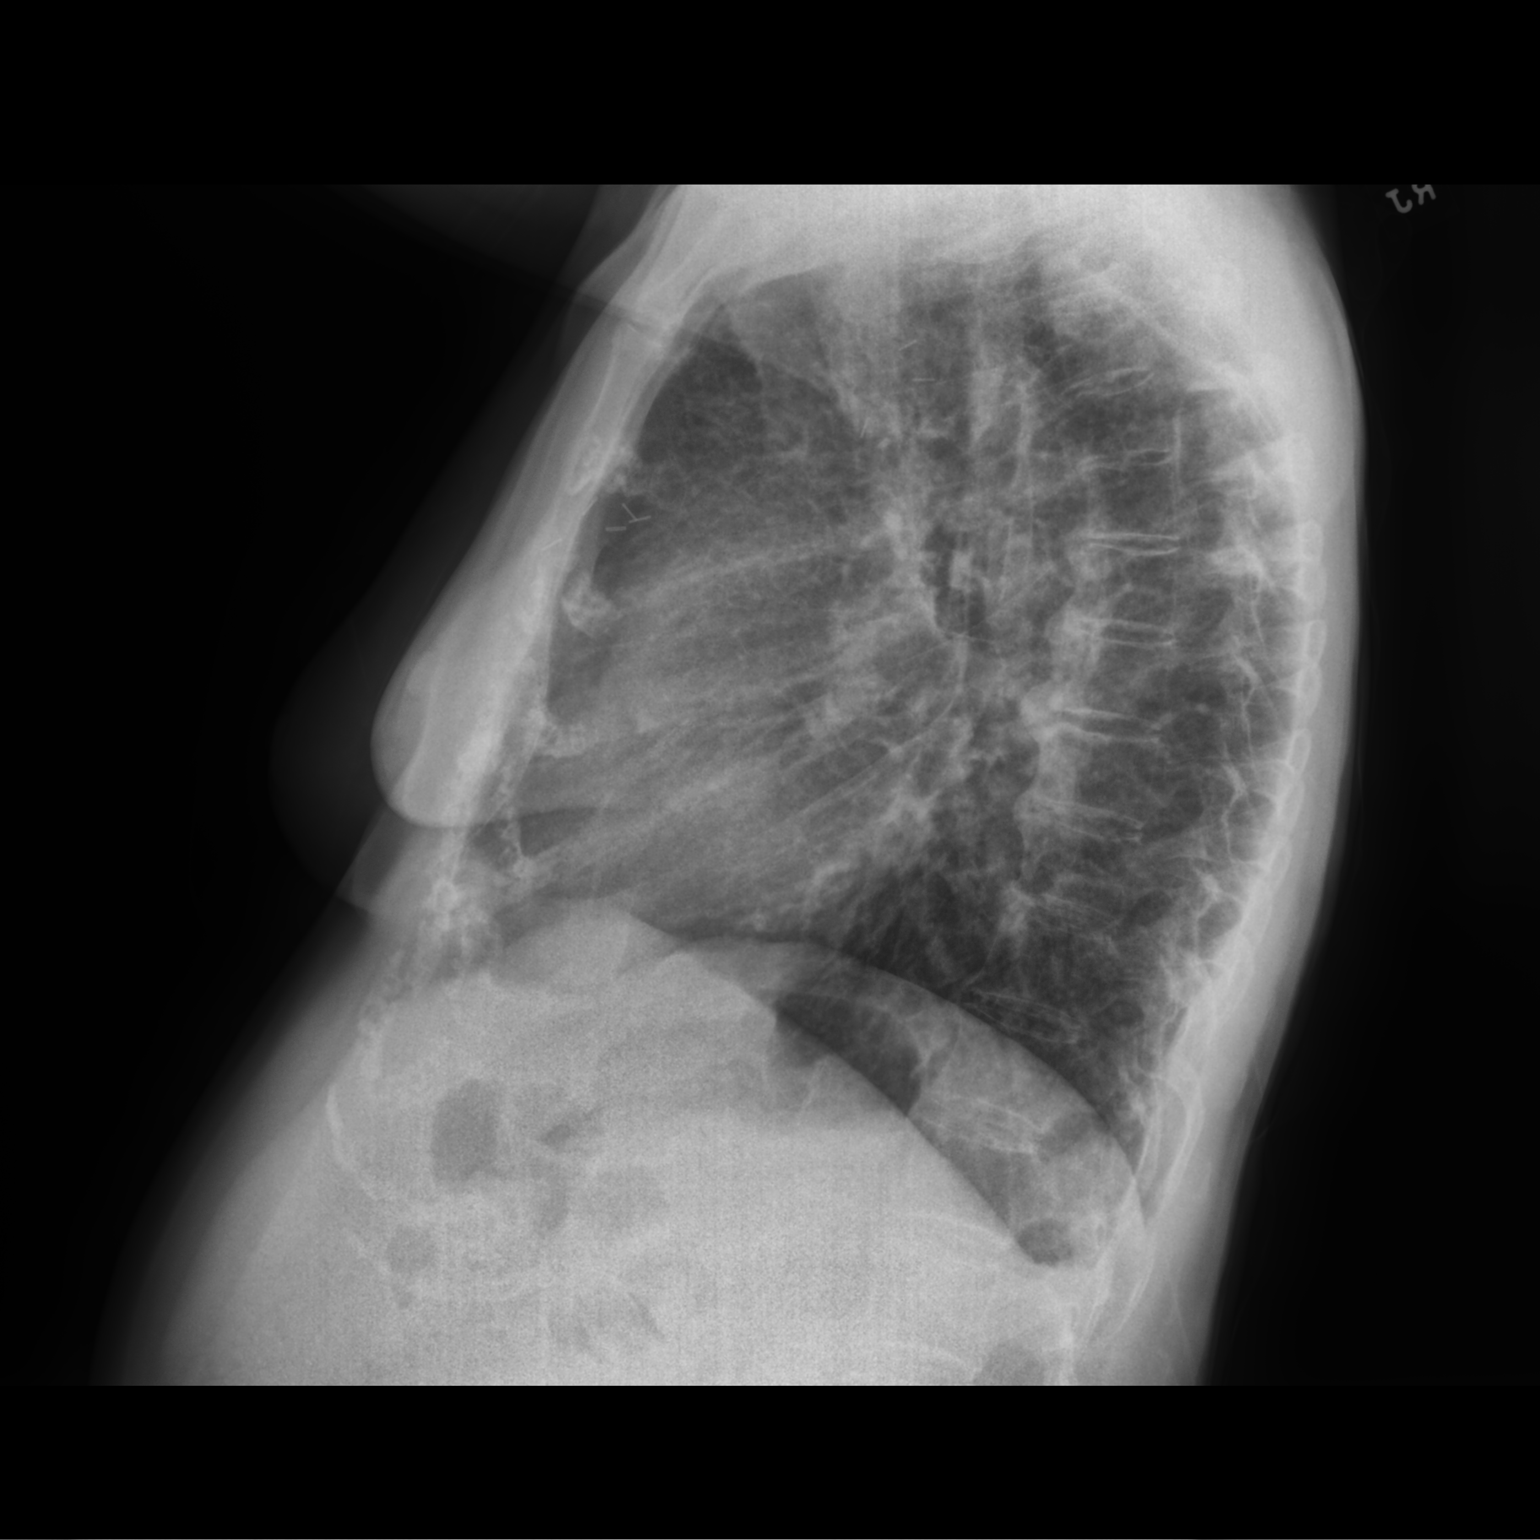

[2 of 2 positions shown; findings below may reference images not displayed]

FINDINGS: Cardiac shadow is within normal limits. Aortic calcifications are
noted. Persistent hyperinflation of the lungs is noted consistent
with emphysema. Scattered interstitial changes are again noted
similar to that seen on prior exam related to emphysematous change.
Postsurgical changes in the left breast are noted. Degenerative
changes of the thoracic spine are noted. No other focal abnormality
is seen.
IMPRESSION: Chronic emphysematous changes similar to that seen on prior CT
examination.

No acute abnormality noted.

## 2023-03-31 ENCOUNTER — Other Ambulatory Visit: Payer: Self-pay | Admitting: Internal Medicine

## 2023-04-06 ENCOUNTER — Other Ambulatory Visit: Payer: Self-pay | Admitting: Radiation Therapy

## 2023-04-06 DIAGNOSIS — C7931 Secondary malignant neoplasm of brain: Secondary | ICD-10-CM

## 2023-04-06 NOTE — Telephone Encounter (Signed)
Needs office visit for further visits.  Has not been seen since 2022.

## 2023-04-11 ENCOUNTER — Telehealth: Payer: Self-pay | Admitting: Cardiology

## 2023-04-11 NOTE — Telephone Encounter (Signed)
Called patient and spoke with husband as patient is sleeping.   He states she is weak from her cancer treatment. Advised to cal PCP for possible PT, OT or other services.  He is confused regarding appointments. And then what office he is speaking to. He thinks he was given the wrong number.  Advised again  speak to PCP for some assistance with helping his wife.  He states understanding.

## 2023-04-11 NOTE — Telephone Encounter (Signed)
Patient stated patient has been unable to walk very well.

## 2023-04-13 DIAGNOSIS — J449 Chronic obstructive pulmonary disease, unspecified: Secondary | ICD-10-CM | POA: Diagnosis not present

## 2023-04-13 NOTE — Progress Notes (Signed)
  Radiation Oncology         [phone] ________________________________  Name: KAO MCKEONE MRN: [id number]  Date of Service: 04/19/2023  DOB: Sep 09, 1946  Post Treatment Telephone Note  Diagnosis:  77 year old female with small cell lung cancer in the LUL lung and 2 small brain metastases noted on recent post-treatment MRI brain. (as documented in provider EOT note)   The patient was available for call today.  The patient did  note fatigue during radiation but has improved. The patient did not note hair loss or skin changes in the field of radiation during therapy. The patient is not taking dexamethasone. The patient does not have symptoms of  weakness or loss of control of the extremities. The patient does not have symptoms of headache. The patient does not have symptoms of seizure or uncontrolled movement. The patient does not have symptoms of changes in vision. The patient does not have changes in speech. The patient does not have confusion.   The patient was counseled that she will be contacted by our brain and spine navigator to schedule surveillance imaging. The patient was encouraged to call if she have not received a call to schedule imaging, or if she develop concerns or questions regarding radiation. The patient will also continue to follow up with Dr. Mosetta Putt in medical oncology.   This concludes the interaction.  Ruel Favors, LPN

## 2023-04-15 DIAGNOSIS — J449 Chronic obstructive pulmonary disease, unspecified: Secondary | ICD-10-CM | POA: Diagnosis not present

## 2023-04-18 ENCOUNTER — Other Ambulatory Visit: Payer: Self-pay | Admitting: Hematology

## 2023-04-18 ENCOUNTER — Other Ambulatory Visit: Payer: Self-pay | Admitting: Nurse Practitioner

## 2023-04-18 DIAGNOSIS — E1165 Type 2 diabetes mellitus with hyperglycemia: Secondary | ICD-10-CM | POA: Diagnosis not present

## 2023-04-18 DIAGNOSIS — D751 Secondary polycythemia: Secondary | ICD-10-CM | POA: Diagnosis not present

## 2023-04-18 DIAGNOSIS — R2689 Other abnormalities of gait and mobility: Secondary | ICD-10-CM | POA: Diagnosis not present

## 2023-04-18 DIAGNOSIS — S41112A Laceration without foreign body of left upper arm, initial encounter: Secondary | ICD-10-CM | POA: Diagnosis not present

## 2023-04-18 DIAGNOSIS — C3490 Malignant neoplasm of unspecified part of unspecified bronchus or lung: Secondary | ICD-10-CM | POA: Diagnosis not present

## 2023-04-18 DIAGNOSIS — E7800 Pure hypercholesterolemia, unspecified: Secondary | ICD-10-CM | POA: Diagnosis not present

## 2023-04-18 DIAGNOSIS — I10 Essential (primary) hypertension: Secondary | ICD-10-CM | POA: Diagnosis not present

## 2023-04-18 DIAGNOSIS — K219 Gastro-esophageal reflux disease without esophagitis: Secondary | ICD-10-CM | POA: Diagnosis not present

## 2023-04-18 DIAGNOSIS — R350 Frequency of micturition: Secondary | ICD-10-CM | POA: Diagnosis not present

## 2023-04-18 DIAGNOSIS — R031 Nonspecific low blood-pressure reading: Secondary | ICD-10-CM | POA: Diagnosis not present

## 2023-04-18 DIAGNOSIS — M810 Age-related osteoporosis without current pathological fracture: Secondary | ICD-10-CM | POA: Diagnosis not present

## 2023-04-18 DIAGNOSIS — R569 Unspecified convulsions: Secondary | ICD-10-CM | POA: Diagnosis not present

## 2023-04-18 DIAGNOSIS — E2839 Other primary ovarian failure: Secondary | ICD-10-CM

## 2023-04-19 ENCOUNTER — Ambulatory Visit
Admission: RE | Admit: 2023-04-19 | Discharge: 2023-04-19 | Disposition: A | Discharge status: Home / Self Care | Payer: Medicare Other | Source: Ambulatory Visit | Attending: Radiation Oncology | Admitting: Radiation Oncology

## 2023-04-30 IMAGING — DX DG ABDOMEN 2V
4 series · 4 of 4 positions shown · non-contrast
Comparison: None.

CLINICAL DATA: Acute lower abdominal pain.

EXAM:
ABDOMEN - 2 VIEW

[abdomen erect (1 of 2)]
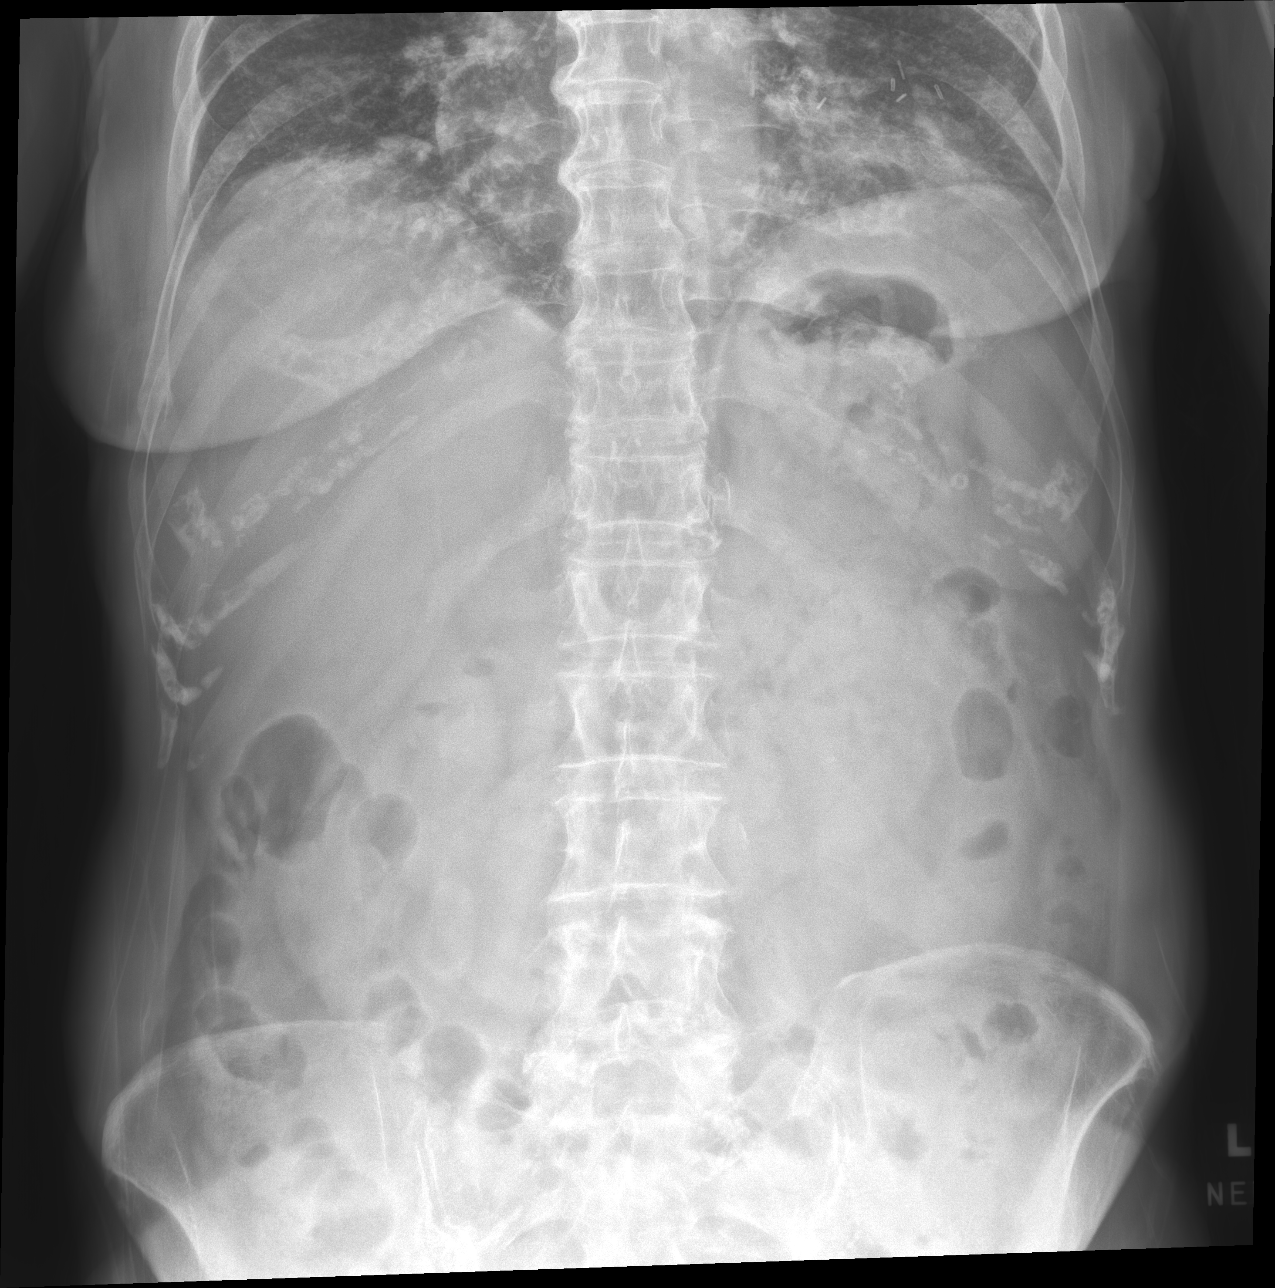

[abdomen supine (1 of 2)]
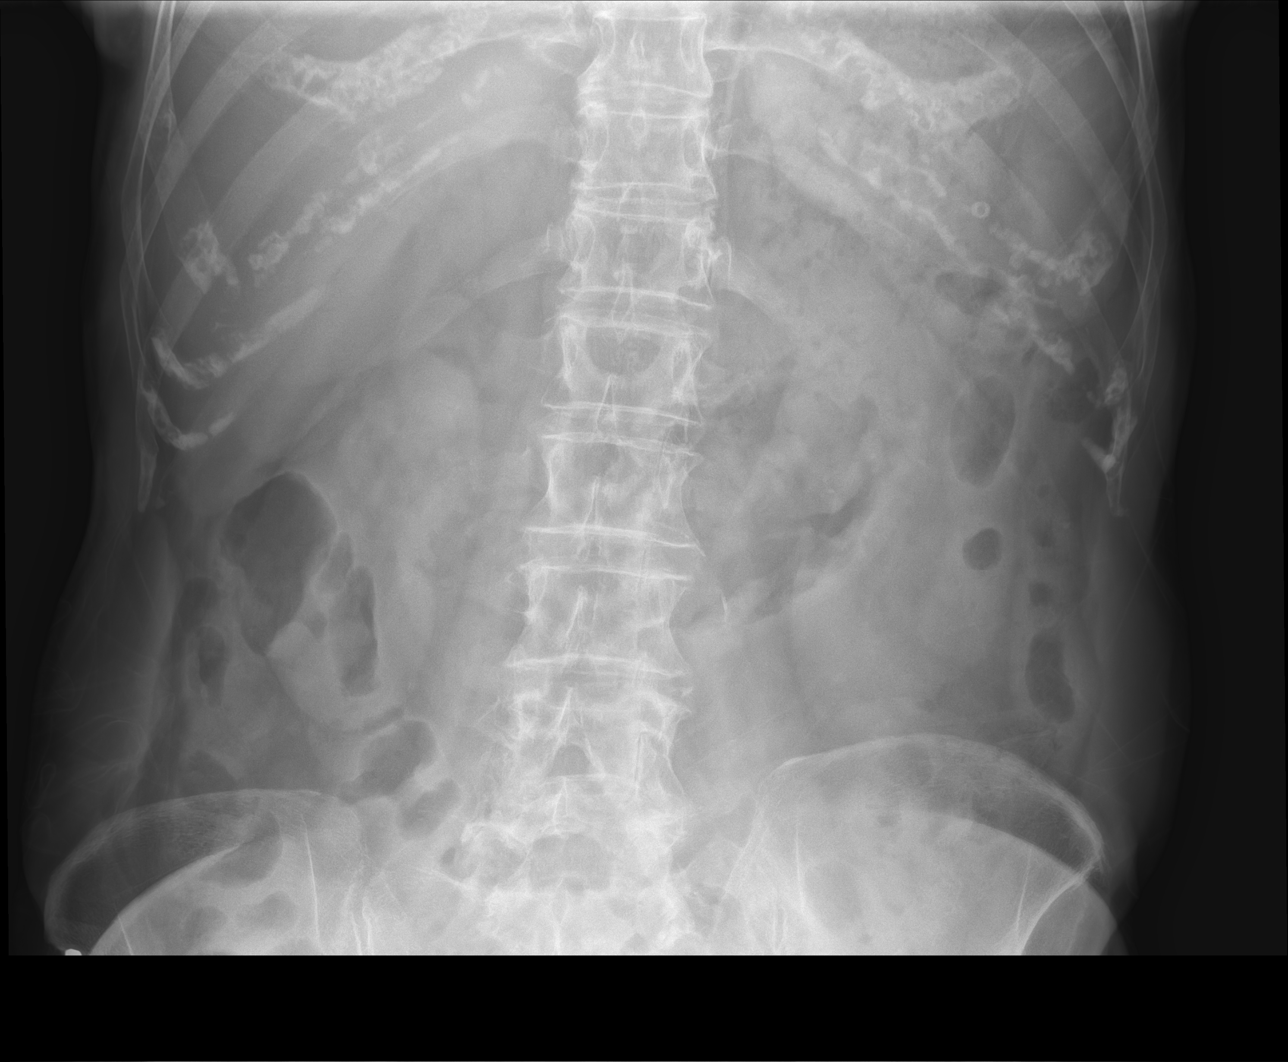

[abdomen supine (2 of 2)]
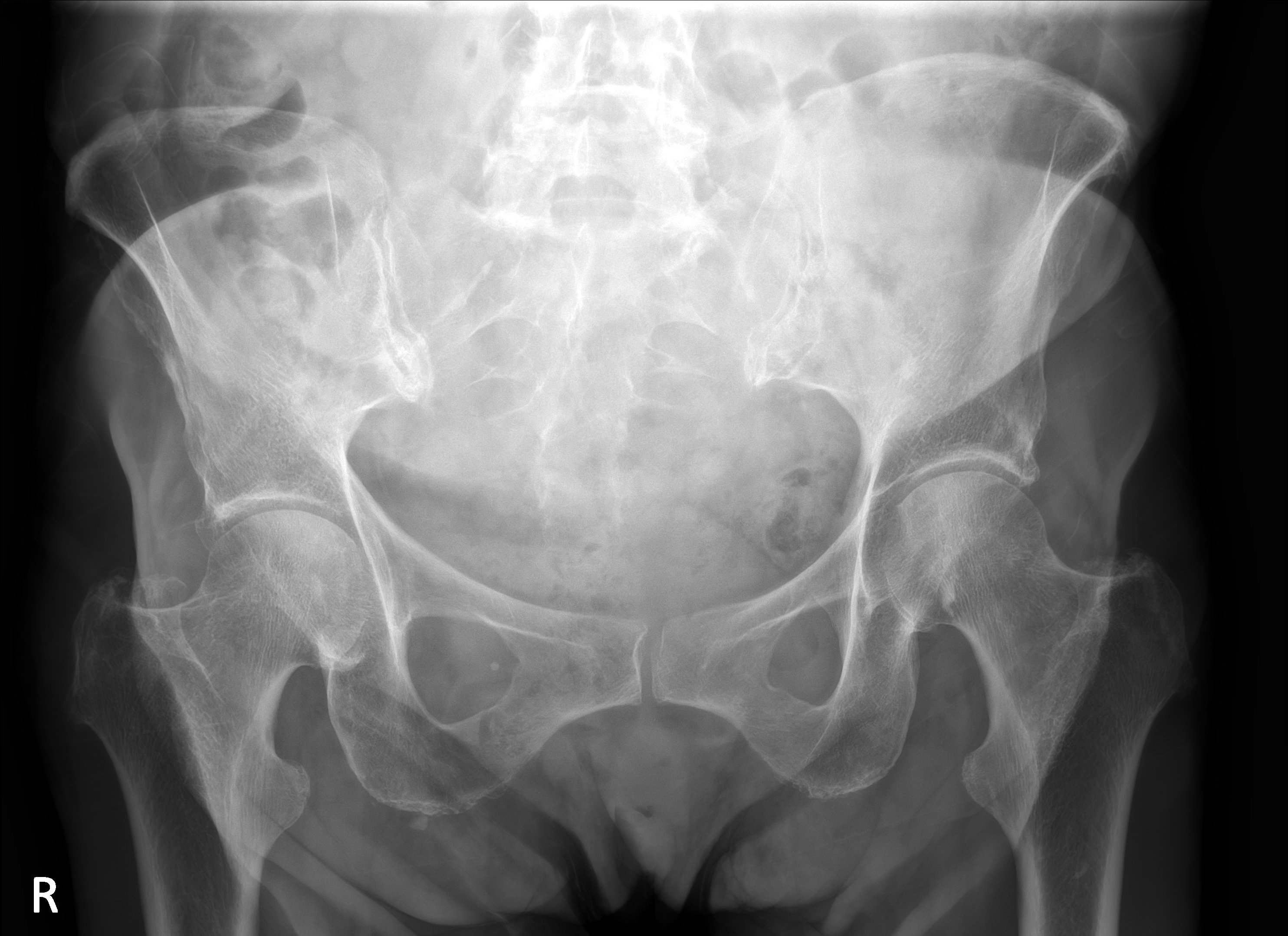

[abdomen erect (2 of 2)]
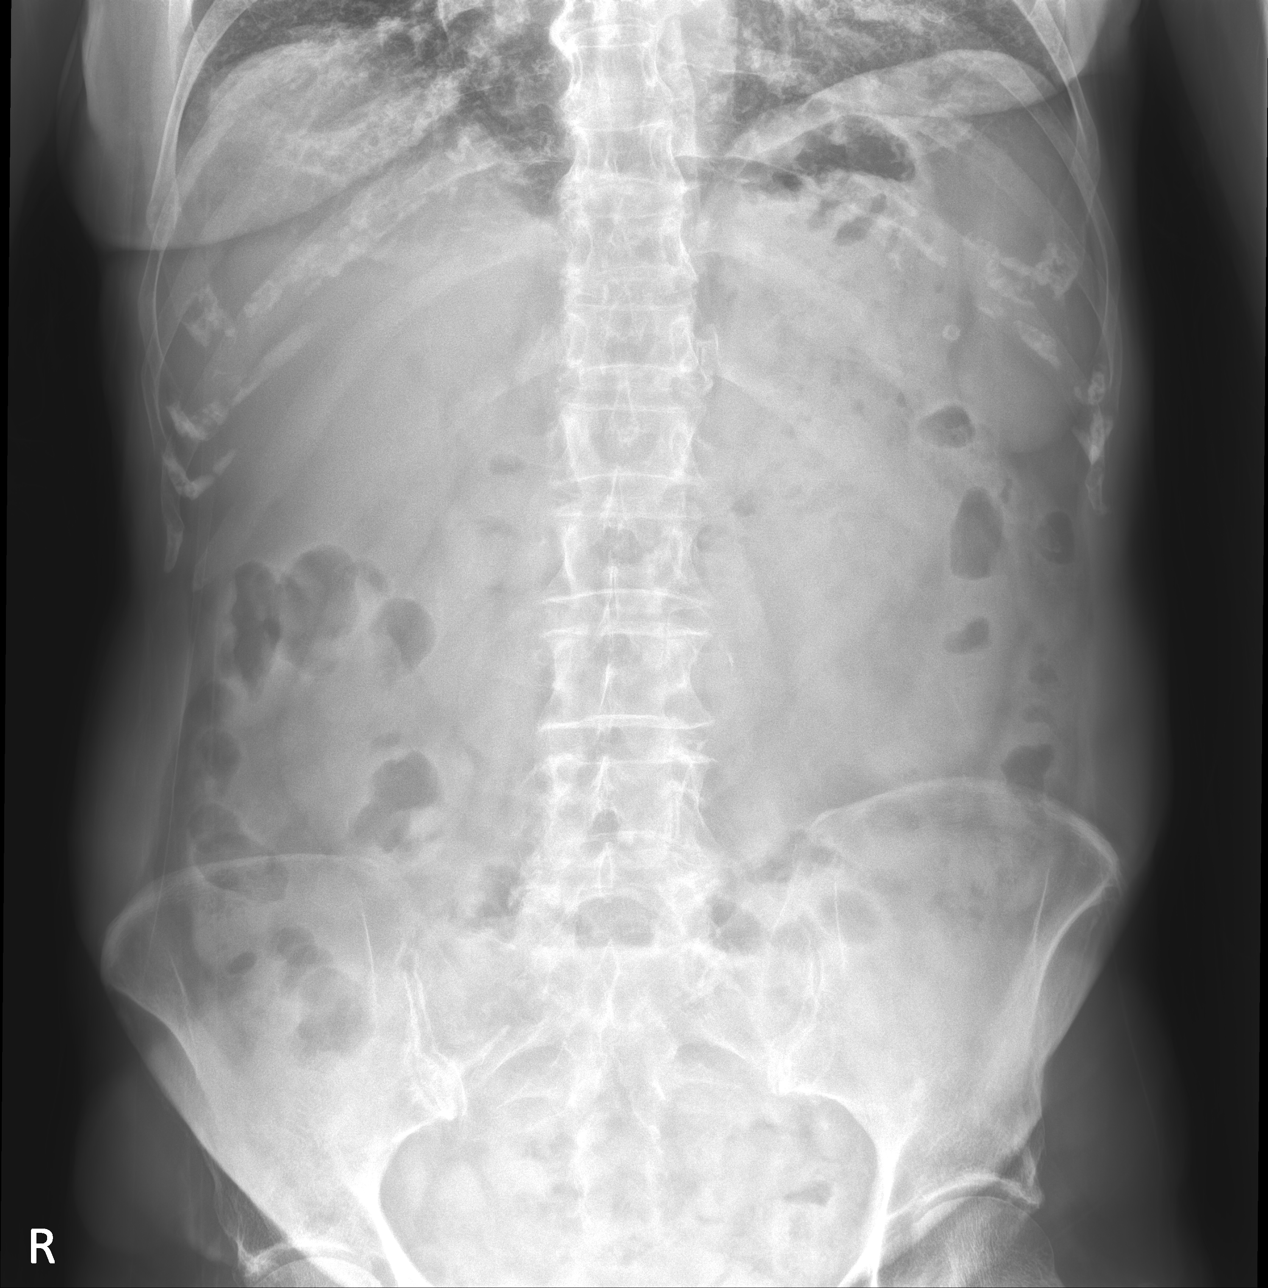

[4 of 4 positions shown; findings below may reference images not displayed]

FINDINGS: The bowel gas pattern is normal. There is no evidence of free air.
No radio-opaque calculi or other significant radiographic
abnormality is seen.
IMPRESSION: Negative.

## 2023-05-04 DIAGNOSIS — Z853 Personal history of malignant neoplasm of breast: Secondary | ICD-10-CM | POA: Diagnosis not present

## 2023-05-04 DIAGNOSIS — I10 Essential (primary) hypertension: Secondary | ICD-10-CM | POA: Diagnosis not present

## 2023-05-04 DIAGNOSIS — C7931 Secondary malignant neoplasm of brain: Secondary | ICD-10-CM | POA: Diagnosis not present

## 2023-05-04 DIAGNOSIS — R410 Disorientation, unspecified: Secondary | ICD-10-CM | POA: Diagnosis not present

## 2023-05-04 DIAGNOSIS — C7800 Secondary malignant neoplasm of unspecified lung: Secondary | ICD-10-CM | POA: Diagnosis not present

## 2023-05-04 DIAGNOSIS — R29898 Other symptoms and signs involving the musculoskeletal system: Secondary | ICD-10-CM | POA: Diagnosis not present

## 2023-05-04 DIAGNOSIS — R531 Weakness: Secondary | ICD-10-CM | POA: Diagnosis not present

## 2023-05-04 DIAGNOSIS — C3412 Malignant neoplasm of upper lobe, left bronchus or lung: Secondary | ICD-10-CM | POA: Diagnosis not present

## 2023-05-05 DIAGNOSIS — R410 Disorientation, unspecified: Secondary | ICD-10-CM | POA: Diagnosis not present

## 2023-05-05 DIAGNOSIS — Z853 Personal history of malignant neoplasm of breast: Secondary | ICD-10-CM | POA: Diagnosis not present

## 2023-05-05 DIAGNOSIS — C3412 Malignant neoplasm of upper lobe, left bronchus or lung: Secondary | ICD-10-CM | POA: Diagnosis not present

## 2023-05-05 DIAGNOSIS — R531 Weakness: Secondary | ICD-10-CM | POA: Diagnosis not present

## 2023-05-08 NOTE — Assessment & Plan Note (Signed)
-  [id number], stage III, limited stage  -found on screening CT chest on July 22, 2022 which showed suprahilar left upper lobe pulmonary mass and mediastinal adenopathy -PET scan from August 08, 2022 showed hypermetabolic left hilar lung mass with mediastinal and left supraclavicular nodal metastasis. 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 Brain MRI negative  -started concurrent chemo cisplatin/etoposide and radiation 09/29/2022, completed RT on 11/15/2022 -she completed 4 cycle chemo on 12/06/22 -she finished prophylactic brain radiation on 03/04/2023. -She is recovering well from chemoradiation, I encouraged her to continue high-protein high calorie diet, and stay active -Restaging CT scan from March 14, 2023 showed near complete response  -will repeat CT in in 6 weeks

## 2023-05-08 NOTE — Assessment & Plan Note (Signed)
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 Tolerating well.  -on surveillance, mammograms annually in May at [facility], I have requested the report -physical exam was unremarkable, no clinical evidence of recurrence. Continue surveillance and anastrozole

## 2023-05-09 ENCOUNTER — Emergency Department (HOSPITAL_COMMUNITY): Payer: Medicare Other

## 2023-05-09 ENCOUNTER — Inpatient Hospital Stay (HOSPITAL_COMMUNITY)
Admission: EM | Admit: 2023-05-09 | Discharge: 2023-05-12 | DRG: 082 | Disposition: E | Payer: Medicare Other | Attending: Internal Medicine | Admitting: Internal Medicine

## 2023-05-09 ENCOUNTER — Inpatient Hospital Stay: Payer: Medicare Other | Attending: Hematology

## 2023-05-09 ENCOUNTER — Other Ambulatory Visit: Payer: Self-pay

## 2023-05-09 ENCOUNTER — Inpatient Hospital Stay: Payer: Medicare Other | Admitting: Hematology

## 2023-05-09 ENCOUNTER — Encounter: Payer: Self-pay | Admitting: Hematology

## 2023-05-09 ENCOUNTER — Telehealth: Payer: Self-pay | Admitting: Family Medicine

## 2023-05-09 VITALS — BP 136/82 | HR 82 | Temp 98.1°F | Ht 65.0 in | Wt 94.2 lb

## 2023-05-09 DIAGNOSIS — Z170 Estrogen receptor positive status [ER+]: Secondary | ICD-10-CM

## 2023-05-09 DIAGNOSIS — C50212 Malignant neoplasm of upper-inner quadrant of left female breast: Secondary | ICD-10-CM

## 2023-05-09 DIAGNOSIS — Z841 Family history of disorders of kidney and ureter: Secondary | ICD-10-CM

## 2023-05-09 DIAGNOSIS — I2510 Atherosclerotic heart disease of native coronary artery without angina pectoris: Secondary | ICD-10-CM | POA: Diagnosis not present

## 2023-05-09 DIAGNOSIS — Z794 Long term (current) use of insulin: Secondary | ICD-10-CM | POA: Diagnosis not present

## 2023-05-09 DIAGNOSIS — S065X0A Traumatic subdural hemorrhage without loss of consciousness, initial encounter: Secondary | ICD-10-CM | POA: Diagnosis not present

## 2023-05-09 DIAGNOSIS — E876 Hypokalemia: Secondary | ICD-10-CM | POA: Diagnosis present

## 2023-05-09 DIAGNOSIS — C3490 Malignant neoplasm of unspecified part of unspecified bronchus or lung: Secondary | ICD-10-CM | POA: Diagnosis not present

## 2023-05-09 DIAGNOSIS — I110 Hypertensive heart disease with heart failure: Secondary | ICD-10-CM | POA: Diagnosis present

## 2023-05-09 DIAGNOSIS — S0636AA Traumatic hemorrhage of cerebrum, unspecified, with loss of consciousness status unknown, initial encounter: Secondary | ICD-10-CM | POA: Diagnosis not present

## 2023-05-09 DIAGNOSIS — Y92009 Unspecified place in unspecified non-institutional (private) residence as the place of occurrence of the external cause: Secondary | ICD-10-CM

## 2023-05-09 DIAGNOSIS — S199XXA Unspecified injury of neck, initial encounter: Secondary | ICD-10-CM | POA: Diagnosis not present

## 2023-05-09 DIAGNOSIS — G935 Compression of brain: Secondary | ICD-10-CM | POA: Diagnosis not present

## 2023-05-09 DIAGNOSIS — Z7984 Long term (current) use of oral hypoglycemic drugs: Secondary | ICD-10-CM | POA: Diagnosis not present

## 2023-05-09 DIAGNOSIS — Z923 Personal history of irradiation: Secondary | ICD-10-CM

## 2023-05-09 DIAGNOSIS — R64 Cachexia: Secondary | ICD-10-CM | POA: Diagnosis not present

## 2023-05-09 DIAGNOSIS — E785 Hyperlipidemia, unspecified: Secondary | ICD-10-CM | POA: Diagnosis not present

## 2023-05-09 DIAGNOSIS — Z515 Encounter for palliative care: Secondary | ICD-10-CM | POA: Diagnosis not present

## 2023-05-09 DIAGNOSIS — R54 Age-related physical debility: Secondary | ICD-10-CM | POA: Diagnosis present

## 2023-05-09 DIAGNOSIS — I499 Cardiac arrhythmia, unspecified: Secondary | ICD-10-CM | POA: Diagnosis not present

## 2023-05-09 DIAGNOSIS — S06A1XA Traumatic brain compression with herniation, initial encounter: Secondary | ICD-10-CM | POA: Diagnosis present

## 2023-05-09 DIAGNOSIS — I6201 Nontraumatic acute subdural hemorrhage: Secondary | ICD-10-CM | POA: Diagnosis not present

## 2023-05-09 DIAGNOSIS — Z4682 Encounter for fitting and adjustment of non-vascular catheter: Secondary | ICD-10-CM | POA: Diagnosis not present

## 2023-05-09 DIAGNOSIS — Z7982 Long term (current) use of aspirin: Secondary | ICD-10-CM

## 2023-05-09 DIAGNOSIS — R569 Unspecified convulsions: Secondary | ICD-10-CM | POA: Diagnosis present

## 2023-05-09 DIAGNOSIS — R6889 Other general symptoms and signs: Secondary | ICD-10-CM | POA: Diagnosis not present

## 2023-05-09 DIAGNOSIS — J439 Emphysema, unspecified: Secondary | ICD-10-CM | POA: Diagnosis not present

## 2023-05-09 DIAGNOSIS — I629 Nontraumatic intracranial hemorrhage, unspecified: Secondary | ICD-10-CM | POA: Diagnosis not present

## 2023-05-09 DIAGNOSIS — Z853 Personal history of malignant neoplasm of breast: Secondary | ICD-10-CM

## 2023-05-09 DIAGNOSIS — J9601 Acute respiratory failure with hypoxia: Secondary | ICD-10-CM | POA: Diagnosis present

## 2023-05-09 DIAGNOSIS — I700 Atherosclerosis of aorta: Secondary | ICD-10-CM | POA: Diagnosis not present

## 2023-05-09 DIAGNOSIS — Z681 Body mass index (BMI) 19 or less, adult: Secondary | ICD-10-CM | POA: Diagnosis not present

## 2023-05-09 DIAGNOSIS — I252 Old myocardial infarction: Secondary | ICD-10-CM

## 2023-05-09 DIAGNOSIS — D696 Thrombocytopenia, unspecified: Secondary | ICD-10-CM | POA: Diagnosis not present

## 2023-05-09 DIAGNOSIS — S065XAA Traumatic subdural hemorrhage with loss of consciousness status unknown, initial encounter: Principal | ICD-10-CM | POA: Diagnosis present

## 2023-05-09 DIAGNOSIS — S061XAA Traumatic cerebral edema with loss of consciousness status unknown, initial encounter: Secondary | ICD-10-CM | POA: Diagnosis not present

## 2023-05-09 DIAGNOSIS — I509 Heart failure, unspecified: Secondary | ICD-10-CM | POA: Diagnosis present

## 2023-05-09 DIAGNOSIS — E871 Hypo-osmolality and hyponatremia: Secondary | ICD-10-CM | POA: Diagnosis not present

## 2023-05-09 DIAGNOSIS — M47812 Spondylosis without myelopathy or radiculopathy, cervical region: Secondary | ICD-10-CM | POA: Diagnosis not present

## 2023-05-09 DIAGNOSIS — R1111 Vomiting without nausea: Secondary | ICD-10-CM | POA: Diagnosis not present

## 2023-05-09 DIAGNOSIS — Z818 Family history of other mental and behavioral disorders: Secondary | ICD-10-CM

## 2023-05-09 DIAGNOSIS — Z90710 Acquired absence of both cervix and uterus: Secondary | ICD-10-CM

## 2023-05-09 DIAGNOSIS — C7931 Secondary malignant neoplasm of brain: Secondary | ICD-10-CM | POA: Diagnosis not present

## 2023-05-09 DIAGNOSIS — D751 Secondary polycythemia: Secondary | ICD-10-CM

## 2023-05-09 DIAGNOSIS — Z9221 Personal history of antineoplastic chemotherapy: Secondary | ICD-10-CM

## 2023-05-09 DIAGNOSIS — M810 Age-related osteoporosis without current pathological fracture: Secondary | ICD-10-CM | POA: Diagnosis present

## 2023-05-09 DIAGNOSIS — Z79899 Other long term (current) drug therapy: Secondary | ICD-10-CM

## 2023-05-09 DIAGNOSIS — Z66 Do not resuscitate: Secondary | ICD-10-CM | POA: Diagnosis present

## 2023-05-09 DIAGNOSIS — E1165 Type 2 diabetes mellitus with hyperglycemia: Secondary | ICD-10-CM | POA: Diagnosis present

## 2023-05-09 DIAGNOSIS — Z95828 Presence of other vascular implants and grafts: Secondary | ICD-10-CM

## 2023-05-09 DIAGNOSIS — E611 Iron deficiency: Secondary | ICD-10-CM

## 2023-05-09 DIAGNOSIS — R000 Tachycardia, unspecified: Secondary | ICD-10-CM | POA: Diagnosis present

## 2023-05-09 DIAGNOSIS — Z452 Encounter for adjustment and management of vascular access device: Secondary | ICD-10-CM | POA: Diagnosis not present

## 2023-05-09 DIAGNOSIS — Z87891 Personal history of nicotine dependence: Secondary | ICD-10-CM | POA: Diagnosis not present

## 2023-05-09 DIAGNOSIS — Z8249 Family history of ischemic heart disease and other diseases of the circulatory system: Secondary | ICD-10-CM | POA: Diagnosis not present

## 2023-05-09 DIAGNOSIS — R404 Transient alteration of awareness: Secondary | ICD-10-CM | POA: Diagnosis not present

## 2023-05-09 DIAGNOSIS — Z4659 Encounter for fitting and adjustment of other gastrointestinal appliance and device: Secondary | ICD-10-CM | POA: Diagnosis not present

## 2023-05-09 DIAGNOSIS — Z85118 Personal history of other malignant neoplasm of bronchus and lung: Secondary | ICD-10-CM

## 2023-05-09 DIAGNOSIS — W01198A Fall on same level from slipping, tripping and stumbling with subsequent striking against other object, initial encounter: Secondary | ICD-10-CM | POA: Diagnosis present

## 2023-05-09 HISTORY — DX: Malignant neoplasm of unspecified part of unspecified bronchus or lung: C34.90

## 2023-05-09 LAB — CBC WITH DIFFERENTIAL (CANCER CENTER ONLY)
Abs Immature Granulocytes: 0.02 10*3/uL (ref 0.00–0.07)
Basophils Absolute: 0 10*3/uL (ref 0.0–0.1)
Basophils Relative: 0 %
Eosinophils Absolute: 0.2 10*3/uL (ref 0.0–0.5)
Eosinophils Relative: 3 %
HCT: 38.4 % (ref 36.0–46.0)
Hemoglobin: 12.6 g/dL (ref 12.0–15.0)
Immature Granulocytes: 0 %
Lymphocytes Relative: 9 %
Lymphs Abs: 0.5 10*3/uL — ABNORMAL LOW (ref 0.7–4.0)
MCH: 23.6 pg — ABNORMAL LOW (ref 26.0–34.0)
MCHC: 32.8 g/dL (ref 30.0–36.0)
MCV: 72 fL — ABNORMAL LOW (ref 80.0–100.0)
Monocytes Absolute: 0.4 10*3/uL (ref 0.1–1.0)
Monocytes Relative: 7 %
Neutro Abs: 4.2 10*3/uL (ref 1.7–7.7)
Neutrophils Relative %: 81 %
Platelet Count: 110 10*3/uL — ABNORMAL LOW (ref 150–400)
RBC: 5.33 MIL/uL — ABNORMAL HIGH (ref 3.87–5.11)
RDW: 21 % — ABNORMAL HIGH (ref 11.5–15.5)
WBC Count: 5.2 10*3/uL (ref 4.0–10.5)
nRBC: 0 % (ref 0.0–0.2)

## 2023-05-09 LAB — CMP (CANCER CENTER ONLY)
ALT: 10 U/L (ref 0–44)
AST: 18 U/L (ref 15–41)
Albumin: 4.3 g/dL (ref 3.5–5.0)
Alkaline Phosphatase: 54 U/L (ref 38–126)
Anion gap: 9 (ref 5–15)
BUN: 28 mg/dL — ABNORMAL HIGH (ref 8–23)
CO2: 29 mmol/L (ref 22–32)
Calcium: 9.9 mg/dL (ref 8.9–10.3)
Chloride: 100 mmol/L (ref 98–111)
Creatinine: 0.34 mg/dL — ABNORMAL LOW (ref 0.44–1.00)
GFR, Estimated: >60 mL/min (ref >60)
Glucose, Bld: 153 mg/dL — ABNORMAL HIGH (ref 70–99)
Potassium: 3 mmol/L — ABNORMAL LOW (ref 3.5–5.1)
Sodium: 138 mmol/L (ref 135–145)
Total Bilirubin: 0.7 mg/dL (ref 0.3–1.2)
Total Protein: 6.6 g/dL (ref 6.5–8.1)

## 2023-05-09 LAB — FERRITIN: Ferritin: 23 ng/mL (ref 11–307)

## 2023-05-09 LAB — CBG MONITORING, ED: Glucose-Capillary: 195 mg/dL — ABNORMAL HIGH (ref 70–99)

## 2023-05-09 MED ORDER — HEPARIN SOD (PORK) LOCK FLUSH 100 UNIT/ML IV SOLN
500.0000 [IU] | Freq: Once | INTRAVENOUS | Status: AC | PRN
  Administered 2023-05-09: 500 [IU]

## 2023-05-09 MED ORDER — MEGESTROL ACETATE 625 MG/5ML PO SUSP: 625.0000 mg | Freq: Every day | ORAL | 0 refills | Status: DC

## 2023-05-09 MED ORDER — NALOXONE HCL 0.4 MG/ML IJ SOLN
INTRAMUSCULAR | Status: AC
  Administered 2023-05-09: 0.4 mg/mL
  Filled 2023-05-09: qty 1

## 2023-05-09 MED ORDER — POTASSIUM CHLORIDE CRYS ER 20 MEQ PO TBCR: 20.0000 meq | EXTENDED_RELEASE_TABLET | Freq: Every day | ORAL | 0 refills | Status: DC

## 2023-05-09 MED ORDER — SODIUM CHLORIDE 0.9% FLUSH
10.0000 mL | Freq: Once | INTRAVENOUS | Status: AC
  Administered 2023-05-09: 10 mL

## 2023-05-09 NOTE — Progress Notes (Signed)
[facility]   [phone] [phone]   Clinic Follow up Note   Patient Care Team: Georgianne Fick, MD as PCP - General (Internal Medicine) Rollene Rotunda, MD as PCP - Cardiology (Cardiology) Claud Kelp, MD as Consulting Physician (General Surgery) Malachy Mood, MD as Consulting Physician (Hematology) Dorothy Puffer, MD as Consulting Physician (Radiation Oncology) Loa Socks, NP as Nurse Practitioner (Hematology and Oncology)  Date of Service:  04/13/2023  CHIEF COMPLAINT: f/u of small cell lung cancer   CURRENT THERAPY:  Cancer Surveillance  ASSESSMENT: Monica [name] is a 77 y.o. female with   Small cell lung cancer (HCC) -[id number], stage III, limited stage  -found on screening CT chest on July 22, 2022 which showed suprahilar left upper lobe pulmonary mass and mediastinal adenopathy -PET scan from August 08, 2022 showed hypermetabolic left hilar lung mass with mediastinal and left supraclavicular nodal metastasis. 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 Brain MRI negative  -started concurrent chemo cisplatin/etoposide and radiation 09/29/2022, completed RT on 11/15/2022 -she completed 4 cycle chemo on 12/06/22 -she finished prophylactic brain radiation on 03/04/2023. 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 Tolerating well.  -on surveillance, mammograms annually in May at [facility], I have requested the  report -physical exam was unremarkable, no clinical evidence of recurrence. Continue surveillance and anastrozole  Imbalance and fall -She has had some memory loss, and cognitive dysfunction since she completed whole brain radiation in May 2024 -She recently developed imbalance gait, and multiple fall.  She was seen in the emergency room in [facility] when she was on vacation, brain MRI was negative -She does have a neurologist NP, I sent a message to her for her follow-up with her. -I also ordered physical therapy at home, and some equipment like a bedside commode, wheelchair, and a walker if her insurance covers.  Hypokalemia -Potassium 3.0 today, unclear etiology. -Current potassium 20 mill equal daily for 7 days   PLAN: -lab reviewed-platelet -110 -CMP- Creatinine 0.34, K 3.0 -I prescribe Megace for appetite -I prescribe Potassium 1 tablet daily -referral to [facility] Neurologist  -evaluation -I reached out to [name] -I order CT CAP in 6 weeks -lab and f/u in 6-7 weeks  SUMMARY OF ONCOLOGIC HISTORY: Oncology History Overview Note  Cancer Staging Malignant neoplasm of upper-inner quadrant of left breast in female, estrogen receptor positive (HCC) Staging form: Breast, AJCC 8th Edition - Clinical stage from 02/16/2017: Stage IA (cT1b, cN0, cM0, G2, ER: Positive, PR: Positive, HER2: Negative) - Unsigned Staging comments: Staged at breast conference  - Pathologic stage from 03/16/2017: Stage IA (pT1b, pN0, cM0, G2, ER: Positive, PR: Positive, HER2: Negative, Oncotype DX score: 27) - Signed by Malachy Mood, MD on 04/24/2017     Malignant neoplasm of upper-inner quadrant of left breast in female, estrogen receptor positive (HCC)  02/09/2017 Initial Diagnosis   Malignant neoplasm of upper-inner quadrant of left breast in female, estrogen receptor positive (HCC)   02/09/2017 Initial Biopsy   Diagnosis Breast, left, needle core biopsy - INVASIVE  DUCTAL CARCINOMA, G1-2 - DUCTAL CARCINOMA IN SITU    02/09/2017 Receptors her2   Estrogen Receptor: 100%, POSITIVE, STRONG STAINING INTENSITY Progesterone Receptor: 15%, POSITIVE, STRONG STAINING INTENSITY Proliferation Marker Ki67: 15% HER2 (-)   03/16/2017 Surgery   LEFT BREAST LUMPECTOMY WITH RADIOACTIVE SEED AND LEFT AXILLARY DEEP SENTINEL LYMPH NODE  BIOPSY ADJACENT TISSUE TRANSFER by Dr. Derrell Lolling    03/16/2017 Pathology Results   PATHOLOGY REPORT Diagnosis 03/16/17 1. Breast, lumpectomy, Left - INVASIVE DUCTAL CARCINOMA, NOTTINGHAM GRADE 2 OF 3, 0.9 CM - DUCTAL CARCINOMA IN SITU - MARGINS UNINVOLVED BY CARCINOMA (0.3 CM POSTERIOR MARGIN) - PREVIOUS BIOPSY SITE CHANGES - SEE ONCOLOGY TABLE BELOW 2. Lymph node, sentinel, biopsy, Left axillary #1 - NO CARCINOMA IDENTIFIED IN ONE LYMPH NODE (0/1) 3. Lymph node, sentinel, biopsy, Left axillary #2 - NO CARCINOMA IDENTIFIED IN ONE LYMPH NODE (0/1) 4. Lymph node, sentinel, biopsy, Left axillary #3 - NO CARCINOMA IDENTIFIED IN ONE LYMPH NODE (0/1) 5. Lymph node, sentinel, biopsy, Left axillary #4 - NO CARCINOMA IDENTIFIED IN ONE LYMPH NODE (0/1)    03/16/2017 Oncotype testing   Recurrance score of 27 with a 18% distance recurrance in the net 10 years with Tamoxifen alone    05/19/2017 - 06/16/2017 Radiation Therapy   Radiation with Dr. Mitzi Hansen   06/2017 -  Anti-estrogen oral therapy   anastrozole 1 mg once a day starting late 06/2017     04/29/2020 Pathology Results   DIAGNOSIS:   BONE MARROW, ASPIRATE, CLOT, CORE:  -  Cellular marrow with trilineage hematopoiesis  -  No morphologic evidence of carcinoma or hematopoietic neoplasm  -  See microscopic description below   PERIPHERAL BLOOD:  -  Polycythemia and thrombocytopenia  -  See complete blood cell count   MICROSCOPIC DESCRIPTION:   PERIPHERAL BLOOD SMEAR: The peripheral blood has a polycythemia and  thrombocytopenia.  Leukocytes are morphologically unremarkable.   BONE MARROW ASPIRATE: Spicular, cellular and adequate for evaluation   Erythroid precursors: Orderly maturation without overt dysplasia  Granulocytic precursors: Orderly maturation without overt dysplasia  Megakaryocytes: Qualitatively and quantitatively unremarkable  Lymphocytes/plasma cells: No lymphocytosis or plasmacytosis    Small cell lung cancer (HCC)  09/14/2022 Cancer Staging   Staging form: Lung, AJCC 8th Edition - Clinical stage from 09/14/2022: Stage IIIB (cT2, cN3, cM0) - Signed by Malachy Mood, MD on 09/16/2022   09/16/2022 Initial Diagnosis   Small cell lung cancer (HCC)   09/29/2022 -  Chemotherapy   Patient is on Treatment Plan : LUNG SMALL CELL Cisplatin (80) D1 + Etoposide (100) D1-3 q21d     03/14/2023 Imaging    IMPRESSION: 1. Marked interval decreased size of left hilar mass with mild ill-defined left hilar soft tissue seen on today's exam. 2. Hypermetabolic prevascular and left supraclavicular lymph nodes seen on prior PET-CT are no longer visible. No evidence of progressive metastatic disease in the chest. 3. Coronary artery calcifications, aortic Atherosclerosis (ICD10-I70.0) and Emphysema (ICD10-J43.9).        INTERVAL HISTORY:  Monica [name] is here for a follow up of small cell lung cancer. She was last seen by me on 03/17/2023. She presents to the clinic accompanied by daughter. Pt state that she has some and very unbalance.Pt was taken to the ED at you [facility] due to being disoriented. Pt fell twice this morning.Pt daughter state that she is more alert. Pt denies having coughing, chest discomfort and      All other systems were reviewed with the patient and are  negative.  MEDICAL HISTORY:  Past Medical History:  Diagnosis Date   CAD (coronary artery disease)    Cypher stent 09/2002   Cancer (HCC) 2018   left breast, radiation therapy   CHF (congestive heart failure) (HCC)    Depression    Diabetes mellitus (HCC)    Hyperlipidemia    Hypertension    Myocardial infarction (HCC)    2003,went into cardiac shock    Osteoporosis    Seizures (HCC)    1st seizure age late 89's; most recent 07/28/17   Sinusitis     SURGICAL HISTORY: Past Surgical History:  Procedure Laterality Date   ABDOMINAL HYSTERECTOMY     APPENDECTOMY     BREAST LUMPECTOMY WITH RADIOACTIVE SEED AND SENTINEL LYMPH NODE BIOPSY Left 03/16/2017   Procedure: INJECT BLUE DYE LEFT BREAST, LEFT BREAST LUMPECTOMY WITH RADIOACTIVE SEED AND LEFT AXILLARY DEEP SENTINEL LYMPH NODE  BIOPSY ADJACENT TISSUE TRANSFER;  Surgeon: Claud Kelp, MD;  Location: [facility] OR;  Service: General;  Laterality: Left;   CARPAL TUNNEL RELEASE Bilateral    COLONOSCOPY WITH PROPOFOL N/A 05/30/2015   Procedure: COLONOSCOPY WITH PROPOFOL;  Surgeon: Jeani Hawking, MD;  Location: [facility] ENDOSCOPY;  Service: Endoscopy;  Laterality: N/A;   COLONOSCOPY WITH PROPOFOL N/A 06/09/2018   Procedure: COLONOSCOPY WITH PROPOFOL;  Surgeon: Jeani Hawking, MD;  Location: [facility] ENDOSCOPY;  Service: Endoscopy;  Laterality: N/A;   COLONOSCOPY WITH PROPOFOL N/A 07/31/2021   Procedure: COLONOSCOPY WITH PROPOFOL;  Surgeon: Jeani Hawking, MD;  Location: [facility];  Service: Endoscopy;  Laterality: N/A;   CORONARY ANGIOPLASTY WITH STENT PLACEMENT  10-02-2002   ESOPHAGOGASTRODUODENOSCOPY (EGD) WITH PROPOFOL N/A 07/31/2021   Procedure: ESOPHAGOGASTRODUODENOSCOPY (EGD) WITH PROPOFOL;  Surgeon: Jeani Hawking, MD;  Location: [facility];  Service: Endoscopy;  Laterality: N/A;   Exploratory abdominal     Bleeding in abdomen after tubal   IR IMAGING GUIDED PORT INSERTION  09/28/2022   POLYPECTOMY  06/09/2018   Procedure: POLYPECTOMY;  Surgeon: Jeani Hawking, MD;  Location: [facility];  Service: Endoscopy;;   POLYPECTOMY  07/31/2021   Procedure: POLYPECTOMY;  Surgeon: Jeani Hawking, MD;  Location: [facility];  Service: Endoscopy;;   TUBAL LIGATION     UMBILICAL HERNIA REPAIR      I have reviewed the social history and family history with the patient and they are unchanged from previous  note.  ALLERGIES:  is allergic to ace inhibitors, keflex [cephalexin], phenobarbital, sulfate, altace [ramipril], codeine, and dicyclomine hcl.  MEDICATIONS:  Current Outpatient Medications  Medication Sig Dispense Refill   megestrol (MEGACE ES) 625 MG/5ML suspension Take 5 mLs (625 mg total) by mouth daily. 150 mL 0   acetaminophen (TYLENOL) 500 MG tablet Take 1,000 mg by mouth every 6 (six) hours as needed for moderate pain or headache.     albuterol (VENTOLIN HFA) 108 (90 Base) MCG/ACT inhaler INHALE 2 PUFFS BY MOUTH EVERY 6 HOURS AS NEEDED FOR WHEEZING OR  SHORTHNESS  OF  BREATH 9 g 11   anastrozole (ARIMIDEX) 1 MG tablet Take 1 tablet by mouth once daily 90 tablet 0   aspirin EC 81 MG tablet Take 81 mg by mouth at bedtime.     atorvastatin (LIPITOR) 40 MG tablet Take 40 mg by mouth at bedtime.     B-D ULTRAFINE III SHORT PEN 31G X 8 MM MISC Inject into the skin as directed.     Calcium Carbonate-Vitamin D (CALTRATE 600+D PO) Take 1 tablet by mouth in the morning and at bedtime.  carvedilol (COREG) 3.125 MG tablet Take 3.125 mg by mouth 2 (two) times daily.      cetirizine (ZYRTEC) 10 MG tablet Take 10 mg by mouth daily as needed for allergies.     clonazePAM (KLONOPIN) 1 MG tablet Take 1 mg by mouth in the morning and at bedtime.     Insulin Detemir (LEVEMIR) 100 UNIT/ML Pen Inject 17-25 Units into the skin See admin instructions. Inject 25 units subcutaneously in the morning and inject 17 units subcutaneously at night     insulin lispro (HUMALOG) 100 UNIT/ML KwikPen Inject 7-9 Units into the skin See admin instructions. Inject 7 units in the morning, 7 units at lunch, and 9 units at night     ketorolac (ACULAR) 0.5 % ophthalmic solution Place 1 drop into the left eye 4 (four) times daily.     levETIRAcetam (KEPPRA) 500 MG tablet Take 1 tablet (500 mg total) by mouth 2 (two) times daily. 180 tablet 0   lidocaine-prilocaine (EMLA) cream Apply to affected area once 30 g 3   losartan  (COZAAR) 50 MG tablet Take 25 mg by mouth at bedtime.     MAGNESIUM-OXIDE 400 (240 Mg) MG tablet Take 1 tablet by mouth once daily 60 tablet 0   moxifloxacin (VIGAMOX) 0.5 % ophthalmic solution Place 1 drop into the left eye 4 (four) times daily.     nicotine (NICODERM CQ - DOSED IN MG/24 HR) 7 mg/24hr patch Place 1 patch (7 mg total) onto the skin daily. 28 patch 0   nitroGLYCERIN (NITROSTAT) 0.4 MG SL tablet Place 1 tablet (0.4 mg total) under the tongue every 5 (five) minutes as needed for chest pain. 25 tablet 2   Omega-3 Fatty Acids (FISH OIL) 1000 MG CAPS Take 1,000 mg by mouth in the morning and at bedtime.     omeprazole (PRILOSEC) 20 MG capsule Take 20 mg by mouth in the morning.     ONETOUCH VERIO test strip USE THREE TIMES A DAY AND AS NEEDED FOR HYPOGLYCEMIA     OXYGEN Inhale 2 L into the lungs at bedtime.     pioglitazone (ACTOS) 15 MG tablet Take 15 mg by mouth at bedtime.     Polyethyl Glycol-Propyl Glycol 0.4-0.3 % SOLN Place 1 drop into both eyes 2 (two) times daily as needed (itchy/irritated eyes.).     prednisoLONE acetate (PRED FORTE) 1 % ophthalmic suspension Place 1 drop into the left eye 4 (four) times daily.     risedronate (ACTONEL) 150 MG tablet Take 150 mg by mouth every 30 (thirty) days.     sertraline (ZOLOFT) 100 MG tablet Take 100 mg by mouth at bedtime.     TRELEGY ELLIPTA 100-62.5-25 MCG/ACT AEPB INHALE 1 PUFF ONCE DAILY RINSE MOUTH AFTER USE 60 each 0   TRIJARDY XR 12.5-2.02-999 MG TB24 Take 1 tablet by mouth 2 (two) times daily.     No current facility-administered medications for this visit.    PHYSICAL EXAMINATION: ECOG PERFORMANCE STATUS: 2 - Symptomatic, <50% confined to bed  Vitals:   04/20/2023 1129  BP: 136/82  Pulse: 82  Temp: 98.1 F (36.7 C)  SpO2: 94%   Wt Readings from Last 3 Encounters:  04/24/2023 94 lb 3.2 oz (42.7 kg)  03/17/23 94 lb 4.8 oz (42.8 kg)  01/20/23 101 lb (45.8 kg)     GENERAL:alert, no distress and comfortable SKIN:  skin color normal, no rashes or significant lesions EYES: normal, Conjunctiva are pink and non-injected, sclera clear  NEURO:  alert & oriented x 3 with fluent speech LYMPH:  (-)no palpable lymphadenopathy in the cervical, axillary  LUNGS: (-)clear to auscultation and percussion with normal breathing effort HEART: (-)regular rate & rhythm and no murmurs and no lower extremity edema ABDOMEN:(-)abdomen soft, (-)non-tender and (-)normal bowel sounds  LABORATORY DATA:  I have reviewed the data as listed    Latest Ref Rng & Units 04/26/2023   11:05 AM 03/14/2023    9:19 AM 01/17/2023   10:05 AM  CBC  WBC 4.0 - 10.5 K/uL 5.2  4.0  4.8   Hemoglobin 12.0 - 15.0 g/dL 40.9  81.1  91.4   Hematocrit 36.0 - 46.0 % 38.4  41.1  34.6   Platelets 150 - 400 K/uL 110  99  101         Latest Ref Rng & Units 04/21/2023   11:05 AM 03/14/2023    9:19 AM 01/17/2023   10:05 AM  CMP  Glucose 70 - 99 mg/dL 782  956  213   BUN 8 - 23 mg/dL 28  15  15    Creatinine 0.44 - 1.00 mg/dL 0.86  5.78  4.69   Sodium 135 - 145 mmol/L 138  137  138   Potassium 3.5 - 5.1 mmol/L 3.0  3.9  3.6   Chloride 98 - 111 mmol/L 100  102  104   CO2 22 - 32 mmol/L 29  29  26    Calcium 8.9 - 10.3 mg/dL 9.9  9.7  9.4   Total Protein 6.5 - 8.1 g/dL 6.6  6.8  6.2   Total Bilirubin 0.3 - 1.2 mg/dL 0.7  0.5  0.3   Alkaline Phos 38 - 126 U/L 54  46  55   AST 15 - 41 U/L 18  15  12    ALT 0 - 44 U/L 10  8  10        RADIOGRAPHIC STUDIES: I have personally reviewed the radiological images as listed and agreed with the findings in the report. No results found.    Orders Placed This Encounter  Procedures   CT CHEST ABDOMEN PELVIS W CONTRAST    Standing Status:   Future    Standing Expiration Date:   05/08/2024    Order Specific Question:   If indicated for the ordered procedure, I authorize the administration of contrast media per Radiology protocol    Answer:   Yes    Order Specific Question:   Does the patient have a contrast  media/X-ray dye allergy?    Answer:   No    Order Specific Question:   Preferred imaging location?    Answer:   [facility]    Order Specific Question:   Release to patient    Answer:   Immediate    Order Specific Question:   If indicated for the ordered procedure, I authorize the administration of oral contrast media per Radiology protocol    Answer:   Yes   Ambulatory Referral to [facility]    Referral Priority:   Routine    Referral Type:   Consultation    Referral Reason:   Specialty Services Required    Number of Visits Requested:   1   All questions were answered. The patient knows to call the clinic with any problems, questions or concerns. No barriers to learning was detected. The total time spent in the appointment was 30 minutes.     Malachy Mood, MD 05/08/2023   I, Monica Martinez,  CMA, am acting as scribe for Malachy Mood, MD.   I have reviewed the above documentation for accuracy and completeness, and I agree with the above.

## 2023-05-09 NOTE — Telephone Encounter (Signed)
LVM and sent mychart msg informing pt of need to schedule follow up with Dr. Marjory Lies per Amy

## 2023-05-09 NOTE — Progress Notes (Signed)
Referral to [facility] placed per Dr. Mosetta Putt.  Spoke with Carma Lair, RMA [phone]) regarding referral.  Placed order on [facility] for bedside commode, adult briefs, and wheelchair.

## 2023-05-10 ENCOUNTER — Emergency Department (HOSPITAL_COMMUNITY): Payer: Medicare Other

## 2023-05-10 ENCOUNTER — Encounter (HOSPITAL_COMMUNITY): Payer: Self-pay | Admitting: Internal Medicine

## 2023-05-10 ENCOUNTER — Other Ambulatory Visit: Payer: Self-pay

## 2023-05-10 ENCOUNTER — Other Ambulatory Visit (HOSPITAL_COMMUNITY): Payer: Medicare Other

## 2023-05-10 ENCOUNTER — Telehealth: Payer: Self-pay | Admitting: Hematology

## 2023-05-10 DIAGNOSIS — Y92009 Unspecified place in unspecified non-institutional (private) residence as the place of occurrence of the external cause: Secondary | ICD-10-CM | POA: Diagnosis not present

## 2023-05-10 DIAGNOSIS — I509 Heart failure, unspecified: Secondary | ICD-10-CM | POA: Diagnosis not present

## 2023-05-10 DIAGNOSIS — Z66 Do not resuscitate: Secondary | ICD-10-CM | POA: Diagnosis not present

## 2023-05-10 DIAGNOSIS — Z87891 Personal history of nicotine dependence: Secondary | ICD-10-CM | POA: Diagnosis not present

## 2023-05-10 DIAGNOSIS — I6201 Nontraumatic acute subdural hemorrhage: Secondary | ICD-10-CM | POA: Diagnosis not present

## 2023-05-10 DIAGNOSIS — I252 Old myocardial infarction: Secondary | ICD-10-CM | POA: Diagnosis not present

## 2023-05-10 DIAGNOSIS — I110 Hypertensive heart disease with heart failure: Secondary | ICD-10-CM | POA: Diagnosis not present

## 2023-05-10 DIAGNOSIS — E785 Hyperlipidemia, unspecified: Secondary | ICD-10-CM | POA: Diagnosis not present

## 2023-05-10 DIAGNOSIS — E1165 Type 2 diabetes mellitus with hyperglycemia: Secondary | ICD-10-CM | POA: Diagnosis not present

## 2023-05-10 DIAGNOSIS — Z794 Long term (current) use of insulin: Secondary | ICD-10-CM | POA: Diagnosis not present

## 2023-05-10 DIAGNOSIS — Z452 Encounter for adjustment and management of vascular access device: Secondary | ICD-10-CM | POA: Diagnosis not present

## 2023-05-10 DIAGNOSIS — Z515 Encounter for palliative care: Secondary | ICD-10-CM | POA: Diagnosis not present

## 2023-05-10 DIAGNOSIS — J9601 Acute respiratory failure with hypoxia: Secondary | ICD-10-CM

## 2023-05-10 DIAGNOSIS — I629 Nontraumatic intracranial hemorrhage, unspecified: Secondary | ICD-10-CM | POA: Diagnosis not present

## 2023-05-10 DIAGNOSIS — Z4682 Encounter for fitting and adjustment of non-vascular catheter: Secondary | ICD-10-CM | POA: Diagnosis not present

## 2023-05-10 DIAGNOSIS — R569 Unspecified convulsions: Secondary | ICD-10-CM | POA: Diagnosis not present

## 2023-05-10 DIAGNOSIS — S0636AA Traumatic hemorrhage of cerebrum, unspecified, with loss of consciousness status unknown, initial encounter: Secondary | ICD-10-CM | POA: Diagnosis not present

## 2023-05-10 DIAGNOSIS — S06A1XA Traumatic brain compression with herniation, initial encounter: Secondary | ICD-10-CM | POA: Diagnosis not present

## 2023-05-10 DIAGNOSIS — S199XXA Unspecified injury of neck, initial encounter: Secondary | ICD-10-CM | POA: Diagnosis not present

## 2023-05-10 DIAGNOSIS — I2510 Atherosclerotic heart disease of native coronary artery without angina pectoris: Secondary | ICD-10-CM | POA: Diagnosis not present

## 2023-05-10 DIAGNOSIS — Z4659 Encounter for fitting and adjustment of other gastrointestinal appliance and device: Secondary | ICD-10-CM | POA: Diagnosis not present

## 2023-05-10 DIAGNOSIS — Z7984 Long term (current) use of oral hypoglycemic drugs: Secondary | ICD-10-CM | POA: Diagnosis not present

## 2023-05-10 DIAGNOSIS — E871 Hypo-osmolality and hyponatremia: Secondary | ICD-10-CM | POA: Diagnosis not present

## 2023-05-10 DIAGNOSIS — I700 Atherosclerosis of aorta: Secondary | ICD-10-CM | POA: Diagnosis not present

## 2023-05-10 DIAGNOSIS — S065XAA Traumatic subdural hemorrhage with loss of consciousness status unknown, initial encounter: Secondary | ICD-10-CM | POA: Diagnosis present

## 2023-05-10 DIAGNOSIS — C7931 Secondary malignant neoplasm of brain: Secondary | ICD-10-CM | POA: Diagnosis not present

## 2023-05-10 DIAGNOSIS — D696 Thrombocytopenia, unspecified: Secondary | ICD-10-CM | POA: Diagnosis not present

## 2023-05-10 DIAGNOSIS — G935 Compression of brain: Secondary | ICD-10-CM | POA: Diagnosis not present

## 2023-05-10 DIAGNOSIS — W01198A Fall on same level from slipping, tripping and stumbling with subsequent striking against other object, initial encounter: Secondary | ICD-10-CM | POA: Diagnosis present

## 2023-05-10 DIAGNOSIS — Z681 Body mass index (BMI) 19 or less, adult: Secondary | ICD-10-CM | POA: Diagnosis not present

## 2023-05-10 DIAGNOSIS — Z8249 Family history of ischemic heart disease and other diseases of the circulatory system: Secondary | ICD-10-CM | POA: Diagnosis not present

## 2023-05-10 DIAGNOSIS — J439 Emphysema, unspecified: Secondary | ICD-10-CM | POA: Diagnosis not present

## 2023-05-10 DIAGNOSIS — S061XAA Traumatic cerebral edema with loss of consciousness status unknown, initial encounter: Secondary | ICD-10-CM | POA: Diagnosis not present

## 2023-05-10 DIAGNOSIS — M47812 Spondylosis without myelopathy or radiculopathy, cervical region: Secondary | ICD-10-CM | POA: Diagnosis not present

## 2023-05-10 DIAGNOSIS — E876 Hypokalemia: Secondary | ICD-10-CM | POA: Diagnosis not present

## 2023-05-10 DIAGNOSIS — R64 Cachexia: Secondary | ICD-10-CM | POA: Diagnosis not present

## 2023-05-10 LAB — CBC
HCT: 41 % (ref 36.0–46.0)
Hemoglobin: 13.1 g/dL (ref 12.0–15.0)
MCH: 23.4 pg — ABNORMAL LOW (ref 26.0–34.0)
MCHC: 32 g/dL (ref 30.0–36.0)
MCV: 73.2 fL — ABNORMAL LOW (ref 80.0–100.0)
Platelets: 134 10*3/uL — ABNORMAL LOW (ref 150–400)
RBC: 5.6 MIL/uL — ABNORMAL HIGH (ref 3.87–5.11)
RDW: 21 % — ABNORMAL HIGH (ref 11.5–15.5)
WBC: 8.4 10*3/uL (ref 4.0–10.5)
nRBC: 0 % (ref 0.0–0.2)

## 2023-05-10 LAB — BASIC METABOLIC PANEL
Anion gap: 16 — ABNORMAL HIGH (ref 5–15)
BUN: 20 mg/dL (ref 8–23)
CO2: 22 mmol/L (ref 22–32)
Calcium: 9.1 mg/dL (ref 8.9–10.3)
Chloride: 95 mmol/L — ABNORMAL LOW (ref 98–111)
Creatinine, Ser: 0.34 mg/dL — ABNORMAL LOW (ref 0.44–1.00)
GFR, Estimated: >60 mL/min (ref >60)
Glucose, Bld: 229 mg/dL — ABNORMAL HIGH (ref 70–99)
Potassium: 2.7 mmol/L — CL (ref 3.5–5.1)
Sodium: 133 mmol/L — ABNORMAL LOW (ref 135–145)

## 2023-05-10 LAB — LACTIC ACID, PLASMA
Lactic Acid, Venous: 1.3 mmol/L (ref 0.5–1.9)
Lactic Acid, Venous: 1.8 mmol/L (ref 0.5–1.9)

## 2023-05-10 LAB — PHOSPHORUS: Phosphorus: 4.3 mg/dL (ref 2.5–4.6)

## 2023-05-10 LAB — GLUCOSE, CAPILLARY
Glucose-Capillary: 203 mg/dL — ABNORMAL HIGH (ref 70–99)
Glucose-Capillary: 193 mg/dL — ABNORMAL HIGH (ref 70–99)

## 2023-05-10 LAB — CBG MONITORING, ED: Glucose-Capillary: 224 mg/dL — ABNORMAL HIGH (ref 70–99)

## 2023-05-10 LAB — TYPE AND SCREEN
ABO/RH(D): B POS
Antibody Screen: NEGATIVE

## 2023-05-10 LAB — SODIUM: Sodium: 137 mmol/L (ref 135–145)

## 2023-05-10 LAB — TROPONIN I (HIGH SENSITIVITY): Troponin I (High Sensitivity): 34 ng/L — ABNORMAL HIGH (ref <18)

## 2023-05-10 LAB — MAGNESIUM: Magnesium: 1.7 mg/dL (ref 1.7–2.4)

## 2023-05-10 LAB — MRSA NEXT GEN BY PCR, NASAL: MRSA by PCR Next Gen: NOT DETECTED

## 2023-05-10 MED ORDER — ETOMIDATE 2 MG/ML IV SOLN
INTRAVENOUS | Status: AC
  Administered 2023-05-10: 2 mg
  Filled 2023-05-10: qty 20

## 2023-05-10 MED ORDER — PROPOFOL 1000 MG/100ML IV EMUL
INTRAVENOUS | Status: AC
  Administered 2023-05-10: 10 ug/kg/min via INTRAVENOUS
  Filled 2023-05-10: qty 100

## 2023-05-10 MED ORDER — MORPHINE 100MG IN NS 100ML (1MG/ML) PREMIX INFUSION
0.0000 mg/h | INTRAVENOUS | Status: DC
  Administered 2023-05-10: 5 mg/h via INTRAVENOUS

## 2023-05-10 MED ORDER — FENTANYL CITRATE PF 50 MCG/ML IJ SOSY
25.0000 ug | PREFILLED_SYRINGE | INTRAMUSCULAR | Status: DC | PRN
  Administered 2023-05-10 (×3): 50 ug via INTRAVENOUS
  Filled 2023-05-10 (×3): qty 1

## 2023-05-10 MED ORDER — ARFORMOTEROL TARTRATE 15 MCG/2ML IN NEBU
15.0000 ug | INHALATION_SOLUTION | Freq: Two times a day (BID) | RESPIRATORY_TRACT | Status: DC
  Administered 2023-05-10: 15 ug via RESPIRATORY_TRACT
  Filled 2023-05-10: qty 2

## 2023-05-10 MED ORDER — SODIUM CHLORIDE 3 % IV SOLN
INTRAVENOUS | Status: DC
  Filled 2023-05-10: qty 500

## 2023-05-10 MED ORDER — BUDESONIDE 0.25 MG/2ML IN SUSP
0.2500 mg | Freq: Two times a day (BID) | RESPIRATORY_TRACT | Status: DC
  Administered 2023-05-10: 0.25 mg via RESPIRATORY_TRACT
  Filled 2023-05-10: qty 2

## 2023-05-10 MED ORDER — SODIUM CHLORIDE 0.9 % IV SOLN: INTRAVENOUS | Status: DC

## 2023-05-10 MED ORDER — PROPOFOL 1000 MG/100ML IV EMUL: 5.0000 ug/kg/min | INTRAVENOUS | Status: DC

## 2023-05-10 MED ORDER — MORPHINE BOLUS VIA INFUSION: 5.0000 mg | INTRAVENOUS | Status: DC | PRN

## 2023-05-10 MED ORDER — LEVETIRACETAM IN NACL 500 MG/100ML IV SOLN
500.0000 mg | Freq: Two times a day (BID) | INTRAVENOUS | Status: DC
  Administered 2023-05-10: 500 mg via INTRAVENOUS
  Filled 2023-05-10: qty 100

## 2023-05-10 MED ORDER — POTASSIUM CHLORIDE 10 MEQ/100ML IV SOLN
10.0000 meq | INTRAVENOUS | Status: DC
  Administered 2023-05-10 (×2): 10 meq via INTRAVENOUS
  Filled 2023-05-10 (×2): qty 100

## 2023-05-10 MED ORDER — REVEFENACIN 175 MCG/3ML IN SOLN
175.0000 ug | Freq: Every day | RESPIRATORY_TRACT | Status: DC
  Administered 2023-05-10: 175 ug via RESPIRATORY_TRACT
  Filled 2023-05-10: qty 3

## 2023-05-10 MED ORDER — MIDAZOLAM HCL 2 MG/2ML IJ SOLN
2.0000 mg | INTRAMUSCULAR | Status: DC | PRN
  Administered 2023-05-10: 2 mg via INTRAVENOUS
  Filled 2023-05-10: qty 2

## 2023-05-10 MED ORDER — MORPHINE 100MG IN NS 100ML (1MG/ML) PREMIX INFUSION
INTRAVENOUS | Status: AC
  Filled 2023-05-10: qty 100

## 2023-05-10 MED ORDER — LIDOCAINE HCL (CARDIAC) PF 100 MG/5ML IV SOSY
PREFILLED_SYRINGE | INTRAVENOUS | Status: AC
  Administered 2023-05-10: 100 mg via INTRAVENOUS
  Filled 2023-05-10: qty 5

## 2023-05-10 MED ORDER — ACETAMINOPHEN 325 MG PO TABS: 650.0000 mg | ORAL_TABLET | Freq: Four times a day (QID) | ORAL | Status: DC | PRN

## 2023-05-10 MED ORDER — ACETAMINOPHEN 650 MG RE SUPP: 650.0000 mg | Freq: Four times a day (QID) | RECTAL | Status: DC | PRN

## 2023-05-10 MED ORDER — POLYETHYLENE GLYCOL 3350 17 G PO PACK: 17.0000 g | PACK | Freq: Every day | ORAL | Status: DC | PRN

## 2023-05-10 MED ORDER — POTASSIUM CHLORIDE 10 MEQ/100ML IV SOLN
10.0000 meq | INTRAVENOUS | Status: AC
  Administered 2023-05-10 (×4): 10 meq via INTRAVENOUS
  Filled 2023-05-10 (×4): qty 100

## 2023-05-10 MED ORDER — CLEVIDIPINE BUTYRATE 0.5 MG/ML IV EMUL
0.0000 mg/h | INTRAVENOUS | Status: DC
  Administered 2023-05-10: 2 mg/h via INTRAVENOUS
  Administered 2023-05-10: 6 mg/h via INTRAVENOUS
  Filled 2023-05-10 (×2): qty 50

## 2023-05-10 MED ORDER — NICARDIPINE HCL IN NACL 20-0.86 MG/200ML-% IV SOLN
INTRAVENOUS | Status: AC
  Filled 2023-05-10: qty 200

## 2023-05-10 MED ORDER — IOHEXOL 350 MG/ML SOLN
75.0000 mL | Freq: Once | INTRAVENOUS | Status: AC | PRN
  Administered 2023-05-10: 75 mL via INTRAVENOUS

## 2023-05-10 MED ORDER — POLYVINYL ALCOHOL 1.4 % OP SOLN: 1.0000 [drp] | Freq: Four times a day (QID) | OPHTHALMIC | Status: DC | PRN

## 2023-05-10 MED ORDER — ROCURONIUM BROMIDE 10 MG/ML (PF) SYRINGE
PREFILLED_SYRINGE | INTRAVENOUS | Status: AC
  Administered 2023-05-10: 100 mg
  Filled 2023-05-10: qty 10

## 2023-05-10 MED ORDER — FENTANYL CITRATE PF 50 MCG/ML IJ SOSY
25.0000 ug | PREFILLED_SYRINGE | INTRAMUSCULAR | Status: DC | PRN
  Administered 2023-05-10: 25 ug via INTRAVENOUS
  Filled 2023-05-10: qty 1

## 2023-05-10 MED ORDER — DOCUSATE SODIUM 100 MG PO CAPS: 100.0000 mg | ORAL_CAPSULE | Freq: Two times a day (BID) | ORAL | Status: DC | PRN

## 2023-05-10 MED ORDER — GLYCOPYRROLATE 0.2 MG/ML IJ SOLN: 0.2000 mg | INTRAMUSCULAR | Status: DC | PRN

## 2023-05-10 MED ORDER — CHLORHEXIDINE GLUCONATE CLOTH 2 % EX PADS: 6.0000 | MEDICATED_PAD | Freq: Every day | CUTANEOUS | Status: DC

## 2023-05-10 MED ORDER — LEVETIRACETAM IN NACL 1000 MG/100ML IV SOLN
1000.0000 mg | Freq: Once | INTRAVENOUS | Status: AC
  Administered 2023-05-10: 1000 mg via INTRAVENOUS
  Filled 2023-05-10: qty 100

## 2023-05-10 MED ORDER — GLYCOPYRROLATE 0.2 MG/ML IJ SOLN
0.2000 mg | INTRAMUSCULAR | Status: DC | PRN
  Administered 2023-05-10: 0.2 mg via INTRAVENOUS
  Filled 2023-05-10: qty 1

## 2023-05-10 MED ORDER — GLYCOPYRROLATE 1 MG PO TABS: 1.0000 mg | ORAL_TABLET | ORAL | Status: DC | PRN

## 2023-05-10 MED ORDER — SODIUM CHLORIDE (PF) 0.9 % IJ SOLN
INTRAMUSCULAR | Status: AC
  Filled 2023-05-10: qty 50

## 2023-05-10 MED ORDER — PANTOPRAZOLE SODIUM 40 MG IV SOLR
40.0000 mg | Freq: Every day | INTRAVENOUS | Status: DC
  Administered 2023-05-10: 40 mg via INTRAVENOUS
  Filled 2023-05-10: qty 10

## 2023-05-10 MED ORDER — INSULIN ASPART 100 UNIT/ML IJ SOLN
0.0000 [IU] | INTRAMUSCULAR | Status: DC
  Administered 2023-05-10: 3 [IU] via SUBCUTANEOUS
  Administered 2023-05-10: 2 [IU] via SUBCUTANEOUS
  Filled 2023-05-10: qty 0.09

## 2023-05-11 ENCOUNTER — Telehealth: Payer: Self-pay | Admitting: Family Medicine

## 2023-05-11 NOTE — Telephone Encounter (Signed)
While checking status of appt with Dr Demetrius Charity, I see patient has unfortunately passed away. Can we please send a sympathy card to family?

## 2023-05-11 NOTE — Telephone Encounter (Signed)
Card mailed to home address

## 2023-05-12 NOTE — Progress Notes (Signed)
Patients daughter Richa Cadiente called in and left a message stating that we could cancel all  the orders for DME equipment and home health coming out her mom is in the hospital after a fall and will not be coming home. Made Dr. Mosetta Putt and nurse Olegario Shearer aware of the call. Also sent message on [employer] to cancel all orders.

## 2023-05-12 NOTE — IPAL (Signed)
  Interdisciplinary Goals of Care Family Meeting   Date carried out: 04/27/2023  Location of the meeting: Bedside  Members involved: Family member (daughter), PCCM PA  Durable Power of Attorney or acting medical decision maker: Daughter, husband - patient has Scientist, water quality in chart dated 04/19/2017.    Discussion: We discussed goals of care for Monica [name]. I met with patient's daughter at bedside and [name].  We talked extensively about patient's current clinical condition and the many medical trials [name] has been through in a short span of time, including the last several month with brain radiation. I reviewed her CT scan with patient's daughter and the unfortunate findings of significant right-sided traumatic subdural hematoma and early herniation.  We discussed [name]'s current neurologic exam, including lack of many brainstem reflexes. I discussed that based on current findings, I am very concerned about [name]'s ability to recover from this insult.  Patient's daughter states that she on multiple occasions had stated that she would not want to live in a nursing facility and would not want to be dependent on others for care.  I explained to patient's daughter that unfortunately this would be likely best case scenario; it is more likely at this point that patient will not be a surgical candidate and will continue to herniate with decompensation of her clinical exam.  Given these circumstances on a background of many months of recovery from breast CA and metastatic lung CA (including brain radiation), patient's daughter agreed that CPR would not be helpful to the patient and would harm her.  Agreed with DNR CODE STATUS, we will not withdraw care at this time but would transition to comfort focused care 7/30 if patient is not deemed a surgical candidate once family has had an opportunity to visit.  Code status:   Code Status: DNR   Disposition: DNR code  status, plan to transfer to 4N ICU at [facility] for definitive neurosurgical evaluation. If deemed definitively not a candidate for surgical intervention, likely transition to comfort-focused care once family has had an opportunity to visit.  Time spent for the meeting: 15 minutes   Tim Lair, PA-C  04/11/2023, 2:45 AM

## 2023-05-12 NOTE — Procedures (Signed)
Extubation Procedure Note  Patient Details:   Name: Monica [name] DOB: 01-31-1946 MRN: [id number]   Airway Documentation:    Vent end date: (not recorded) Vent end time: (not recorded)   Evaluation  O2 sats:  Comfort  Complications: No apparent complications Patient did tolerate procedure well. Bilateral Breath Sounds: Clear, Diminished   No Pt was extubated to comfort care measures. RT will monitor as needed. Jolayne Panther 05/07/2023, 12:39 PM

## 2023-05-12 NOTE — Progress Notes (Signed)
eLink Physician-Brief Progress Note Patient Name: Monica [name] DOB: 1946/02/04 MRN: [id number]   Date of Service  04/25/2023  HPI/Events of Note  77 year old female that initially presented as a code stroke with a history of essential hypertension dyslipidemia and coronary artery disease and a history of seizures.  Initially, the patient had a head injury after a ground-level fall with progressively worsening mental status.  She had new large acute right sided subdural hematoma.  Neurosurgical team was consulted given the degree of neurological injury with 18 mm midline shift, subfalcine herniation, and uncal herniation with intraventricular extension of the blood and she was deemed to be a nonsurgical candidate.  On evaluation with the family, she is likely to transition to comfort measures if deemed nonsurgical.  On presentation, the patient is hypertensive, tachycardic, and requiring 50% FiO2 on the ventilator to maintain 100% FiO2.  Laboratory studies consistent with hyponatremia, hypokalemia, and thrombocytopenia.  Hyperglycemia present.  CT head reviewed with multicompartment bleeding.  Chest radiograph unremarkable.  ET tube in appropriate placement.  OG tube in the stomach.  eICU Interventions  Reviewed advance directive, agree with DNR, seems that her condition is likely irreversible specially if no surgical intervention is available.  Family is leaning towards proceeding with comfort measures which seem consistent with ACP.  To give family a chance to be at bedside, will continue with 3% saline infusion.  Neuroprotective measures including head of bed elevation, normocapnia, adequate sedation, and SBP goal less than 150.  Holding chemoprophylaxis for DVTs.  Pantoprazole GI prophylaxis.  No immediate intervention required.  Continue with ongoing goals of care discussion.     Intervention Category Evaluation Type: New Patient Evaluation  Kayhan Boardley 04/29/2023, 5:37 AM

## 2023-05-12 NOTE — Progress Notes (Signed)
77 y/o female with h/o small cell lung cancer and breast cancer. She presented to the [facility] ED after a fall at home striking the back of her head. Per report she was responsive for EMS but became less responsive in route. She takes ASA 81mg . On arrival to the ED she is not speaking or following commands, but breathing spontaneously. CTH was personally reviewed showing a large acute right SDH measuring up to 10mm in thickness with approximately 18 mm of leftward midline shift, subfalcine herniation and right uncal herniation. There is intraventricular blood within the right lateral and third ventricles. CT angio shows no emergent large vessel occlusion or high-grade stenosis of the intracranial arteries. I discussed her case with Dr. Maurice Small and unfortunately, she is not a surgical candidate. Continue conservative management and consult to stroke neuro.   Emilee Hero, PA-C

## 2023-05-12 NOTE — Progress Notes (Signed)
Pt has died. No EKG activity, no breathing, no pulse. Pt has no heart or lung sounds. TOD [id number]. Above verified with Rayetta Humphrey. RN

## 2023-05-12 NOTE — ED Notes (Signed)
[facility] to room. All questions answered. All belongings with patient. Daughter at bedside and updated.

## 2023-05-12 NOTE — Progress Notes (Signed)
Morphine gtt started. Versed and Robinol given. Pt extubated. Morphine bolus given. Family at bedside.

## 2023-05-12 NOTE — Progress Notes (Signed)
Spoke with Morrie Sheldon at [employer]. Instructed to call with TOD. [phone].

## 2023-05-12 NOTE — Progress Notes (Signed)
Pupils 5-6 and non-reactive. No corneal reflex. Breathing over vent. MD notified.

## 2023-05-12 NOTE — Progress Notes (Signed)
76 year old female who was admitted overnight status post fall leading to right acute on subacute subdural hemorrhage with midline shift, right uncal and right subfalcine herniation    Physical exam: General: Crtitically ill-appearing elderly cachectic female, orally intubated HEENT: [place]/AT, eyes anicteric.  ETT and OGT in place Neuro: Eyes closed, does not open, not following commands, pupils bilateral dilated and fixed, absent corneals.  Positive cough, gag and respiratory drive.  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 with radiation to brain Prior history of breast cancer Coronary artery disease  Diabetes type 2 with hyperglycemia Hypokalemia Cachexia  Continue neuro watch Unfortunately patient developed devastating injury with herniation syndrome Continue 3% hypertonic saline Keep head end of the bed elevated to 45 degrees Neurosurgery is consulted Continue Keppra Continue lung protective ventilation Continue insulin with CBG goal 140-180 Continue electrolyte replacement  Continue supportive care, prognosis guarded   Critical care time spent 35 minutes    Cheri Fowler, MD [facility] See Amion for pager If no response to pager, please call [phone] until 7pm After 7pm, Please call E-link [phone]

## 2023-05-12 NOTE — Consult Note (Signed)
   Providing Compassionate, Quality Care - Together  Neurosurgery Consult  Referring physician: Dr. Merrily Pew Reason for referral: SDH  Chief Complaint: Fall  History of Present Illness: This is a 77 year old female with a history of small cell carcinoma, breast cancer, that was brought to the hospital after a ground-level fall at home and hitting her head.  She was found to have a very large right acute on chronic subdural hematoma with significant midline shift, subfalcine and uncal herniation.  There is also intraventricular hemorrhage.  CTA revealed no vascular pathology or large vessel occlusion.  At this time history is per the chart and her daughter at bedside.  She is currently intubated, not sedated.  Medications: I have reviewed the patient's current medications. Allergies: No Known Allergies  History reviewed. No pertinent family history. Social History:  has no history on file for tobacco use, alcohol use, and drug use.  ROS: Unable to obtain  Physical Exam:  Vital signs in last 24 hours: Temp:  [98 F (36.7 C)-98.3 F (36.8 C)] 98 F (36.7 C) (07/25 1814) Pulse Rate:  [58-128] 65 (07/26 0746) Resp:  [11-18] 14 (07/26 0217) BP: (138-182)/(65-125) 153/88 (07/26 0700) SpO2:  [91 %-98 %] 96 % (07/26 0746) PE: Intubated, not sedated Eyes closed the pain Pupils bilaterally fixed and dilated Midline gaze Absent corneal reflexes bilaterally Minimal extensor posturing bilaterally to pain   Impression/Assessment:  77 year old female with  Large right subdural hematoma, IVH with herniation syndrome  Plan:  -I discussed with the daughter at bedside and recommended no surgical intervention given her severe neurologic condition and evidence of significant herniation syndrome with her significant comorbidities.  Her daughter verbalized understanding, I answered all of her questions.  She agrees with this plan as the patient overall has had progressive decline recently due to  her multiple medical comorbidities and the patient nor her family would want to undergo such aggressive measures with unlikely recovery. -The family would like to perform a palliative extubation, more family members will be coming this morning -Continue hypertonic saline and aggressive medical measures   Thank you for allowing me to participate in this patient's care.  Please do not hesitate to call with questions or concerns.   Monia Pouch, DO Neurosurgeon [facility] Cell: [phone]

## 2023-05-12 NOTE — ED Provider Notes (Signed)
EMERGENCY DEPARTMENT AT [facility] Provider Note   CSN: [id number] Arrival date & time: 05/06/2023  2320  An emergency department physician performed an initial assessment on this suspected stroke patient at 2350.  History  Chief Complaint  Patient presents with   Fall    Pt with dizziness and weakness x today, at 2200 pt had a fall from standing on carpet, pt with contusion to back of Head, EMS reports family denies LOC , and, - thinners, pt not responding to questions, , pt given 4mg  zofran by EMS     Monica [name] is a 77 y.o. female.  Level 5 caveat for acuity of condition.  Patient here by EMS after fall.  She reportedly fell at home striking the back of her head.  She was responsive for EMS but became less responsive and route.  She does have a contusion to the back of her head.  No blood thinner use.  On arrival she is not speaking and not following commands but breathing spontaneously.  Does have a history of breast cancer as well as lung cancer. No blood thinners on medication list.  Daughter Lanice Schwab states she has an advanced directive but does not know the details.  The history is provided by the patient. The history is limited by the condition of the patient.  Fall       Home Medications Prior to Admission medications   Medication Sig Start Date End Date Taking? Authorizing Provider  acetaminophen (TYLENOL) 500 MG tablet Take 1,000 mg by mouth every 6 (six) hours as needed for moderate pain or headache.    [provider]  albuterol (VENTOLIN HFA) 108 (90 Base) MCG/ACT inhaler INHALE 2 PUFFS BY MOUTH EVERY 6 HOURS AS NEEDED FOR WHEEZING OR  SHORTHNESS  OF  BREATH 07/14/21   Jetty Duhamel D, MD  anastrozole (ARIMIDEX) 1 MG tablet Take 1 tablet by mouth once daily 04/18/23   Malachy Mood, MD  aspirin EC 81 MG tablet Take 81 mg by mouth at bedtime.    [provider]  atorvastatin (LIPITOR) 40 MG tablet Take 40 mg by mouth at bedtime.  12/07/16   [provider]  B-D ULTRAFINE III SHORT PEN 31G X 8 MM MISC Inject into the skin as directed. 03/16/20   [provider]  Calcium Carbonate-Vitamin D (CALTRATE 600+D PO) Take 1 tablet by mouth in the morning and at bedtime.    [provider]  carvedilol (COREG) 3.125 MG tablet Take 3.125 mg by mouth 2 (two) times daily.     [provider]  cetirizine (ZYRTEC) 10 MG tablet Take 10 mg by mouth daily as needed for allergies.    [provider]  clonazePAM (KLONOPIN) 1 MG tablet Take 1 mg by mouth in the morning and at bedtime. 03/02/16   [provider]  Insulin Detemir (LEVEMIR) 100 UNIT/ML Pen Inject 17-25 Units into the skin See admin instructions. Inject 25 units subcutaneously in the morning and inject 17 units subcutaneously at night    [provider]  insulin lispro (HUMALOG) 100 UNIT/ML KwikPen Inject 7-9 Units into the skin See admin instructions. Inject 7 units in the morning, 7 units at lunch, and 9 units at night 06/18/21   [provider]  ketorolac (ACULAR) 0.5 % ophthalmic solution Place 1 drop into the left eye 4 (four) times daily. 06/03/22   [provider]  levETIRAcetam (KEPPRA) 500 MG tablet Take 1 tablet (500 mg total)  by mouth 2 (two) times daily. 02/23/23   Lomax, Amy, NP  lidocaine-prilocaine (EMLA) cream Apply to affected area once 09/24/22   Malachy Mood, MD  losartan (COZAAR) 50 MG tablet Take 25 mg by mouth at bedtime.    [provider]  MAGNESIUM-OXIDE 400 (240 Mg) MG tablet Take 1 tablet by mouth once daily 04/18/23   Pollyann Samples, NP  megestrol (MEGACE ES) 625 MG/5ML suspension Take 5 mLs (625 mg total) by mouth daily. 04/22/2023   Malachy Mood, MD  moxifloxacin (VIGAMOX) 0.5 % ophthalmic solution Place 1 drop into the left eye 4 (four) times daily. 06/03/22   [provider]  nicotine (NICODERM CQ - DOSED IN MG/24 HR) 7 mg/24hr patch Place 1 patch (7 mg total) onto the skin  daily. 03/17/23   Malachy Mood, MD  nitroGLYCERIN (NITROSTAT) 0.4 MG SL tablet Place 1 tablet (0.4 mg total) under the tongue every 5 (five) minutes as needed for chest pain. 04/10/21 09/13/22  Rollene Rotunda, MD  Omega-3 Fatty Acids (FISH OIL) 1000 MG CAPS Take 1,000 mg by mouth in the morning and at bedtime.    [provider]  omeprazole (PRILOSEC) 20 MG capsule Take 20 mg by mouth in the morning.    [provider]  ONETOUCH VERIO test strip USE THREE TIMES A DAY AND AS NEEDED FOR HYPOGLYCEMIA 03/12/20   [provider]  OXYGEN Inhale 2 L into the lungs at bedtime.    [provider]  pioglitazone (ACTOS) 15 MG tablet Take 15 mg by mouth at bedtime. 06/18/21   [provider]  Polyethyl Glycol-Propyl Glycol 0.4-0.3 % SOLN Place 1 drop into both eyes 2 (two) times daily as needed (itchy/irritated eyes.).    [provider]  potassium chloride SA (KLOR-CON M) 20 MEQ tablet Take 1 tablet (20 mEq total) by mouth daily. 05/04/2023   Malachy Mood, MD  prednisoLONE acetate (PRED FORTE) 1 % ophthalmic suspension Place 1 drop into the left eye 4 (four) times daily. 06/03/22   [provider]  risedronate (ACTONEL) 150 MG tablet Take 150 mg by mouth every 30 (thirty) days. 03/02/16   [provider]  sertraline (ZOLOFT) 100 MG tablet Take 100 mg by mouth at bedtime. 02/02/17   [provider]  TRELEGY ELLIPTA 100-62.5-25 MCG/ACT AEPB INHALE 1 PUFF ONCE DAILY RINSE MOUTH AFTER USE 04/06/23   Jetty Duhamel D, MD  TRIJARDY XR 12.5-2.02-999 MG TB24 Take 1 tablet by mouth 2 (two) times daily. 02/17/20   [provider]      Allergies    Ace inhibitors, Keflex [cephalexin], Phenobarbital, Sulfate, Altace [ramipril], Codeine, and Dicyclomine hcl    Review of Systems   Review of Systems  Unable to perform ROS: Mental status change    Physical Exam Updated Vital Signs BP (!) 200/87 Comment: Simultaneous filing. User may not have seen  previous data.  Pulse 71 Comment: Simultaneous filing. User may not have seen previous data.  Temp (!) 97.4 F (36.3 C) (Axillary)   Resp 18   Wt 46.5 kg   SpO2 92% Comment: Simultaneous filing. User may not have seen previous data.  BMI 17.06 kg/m  Physical Exam Constitutional:      General: She is in acute distress.     Appearance: Normal appearance. She is ill-appearing.  HENT:     Head: Normocephalic and atraumatic.     Nose: Nose normal.     Mouth/Throat:     Mouth: Mucous membranes are  moist.  Eyes:     Comments: Right pupil dilated likely postsurgical changes.  Left pupil is 3 mm and responsive   Cardiovascular:     Rate and Rhythm: Normal rate.  Pulmonary:     Effort: No respiratory distress.  Abdominal:     Tenderness: There is no abdominal tenderness. There is no guarding or rebound.  Musculoskeletal:        General: Normal range of motion.     Cervical back: Normal range of motion and neck supple.  Skin:    General: Skin is warm.     Capillary Refill: Capillary refill takes less than 2 seconds.  Neurological:     Comments: Somnolent. Does not speak, does not follow commands.  Breathing spontaneously.  Will not hold up arms or legs against gravity.     ED Results / Procedures / Treatments   Labs (all labs ordered are listed, but only abnormal results are displayed) Labs Reviewed  URINALYSIS, ROUTINE W REFLEX MICROSCOPIC - Abnormal; Notable for the following components:      Result Value   Color, Urine STRAW (*)    Glucose, UA >=500 (*)    Ketones, ur 5 (*)    Protein, ur 100 (*)    All other components within normal limits  CBC WITH DIFFERENTIAL/PLATELET - Abnormal; Notable for the following components:   RBC 5.79 (*)    MCV 72.9 (*)    MCH 23.0 (*)    RDW 21.4 (*)    Platelets 142 (*)    All other components within normal limits  COMPREHENSIVE METABOLIC PANEL - Abnormal; Notable for the following components:   Potassium 2.2 (*)    Chloride 96 (*)     Glucose, Bld 175 (*)    BUN 25 (*)    Creatinine, Ser 0.39 (*)    All other components within normal limits  I-STAT CHEM 8, ED - Abnormal; Notable for the following components:   Potassium 2.4 (*)    Chloride 96 (*)    BUN 25 (*)    Creatinine, Ser 0.30 (*)    Glucose, Bld 171 (*)    Calcium, Ion 1.14 (*)    All other components within normal limits  CBG MONITORING, ED - Abnormal; Notable for the following components:   Glucose-Capillary 195 (*)    All other components within normal limits  ETHANOL  PROTIME-INR  APTT  RAPID URINE DRUG SCREEN, HOSP PERFORMED  AMMONIA  TSH  CBC  BASIC METABOLIC PANEL  MAGNESIUM  PHOSPHORUS  HEMOGLOBIN A1C  I-STAT CHEM 8, ED  I-STAT CG4 LACTIC ACID, ED  I-STAT CG4 LACTIC ACID, ED  TYPE AND SCREEN  TROPONIN I (HIGH SENSITIVITY)  TROPONIN I (HIGH SENSITIVITY)    EKG None  Radiology DG Abdomen 1 View  Result Date: 04/22/2023 CLINICAL DATA:  OG tube EXAM: ABDOMEN - 1 VIEW COMPARISON:  03/18/2021 FINDINGS: Esophageal tube tip and side port overlie the proximal stomach. Excreted contrast within the renal collecting systems. Possible mild right hydronephrosis. IMPRESSION: Esophageal tube tip and side port overlie the proximal stomach. Excreted contrast in renal collecting systems with possible mild right hydronephrosis. This could be correlated with renal ultrasound. Electronically Signed   By: Jasmine Pang M.D.   On: 04/11/2023 00:56   DG Chest Portable 1 View  Result Date: 04/16/2023 CLINICAL DATA:  Post intubation EXAM: PORTABLE CHEST 1 VIEW COMPARISON:  02/09/2021 FINDINGS: Endotracheal tube tip is about 2 cm superior to the carina. Esophageal tube tip below  the diaphragm but incompletely visualized. Right-sided central venous catheter port tip at the SVC. Emphysema. No acute airspace disease. Normal cardiac size. Aortic atherosclerosis IMPRESSION: Endotracheal tube tip about 2 cm superior to the carina.  Emphysema Electronically Signed    By: Jasmine Pang M.D.   On: 04/12/2023 00:55   CT ANGIO HEAD NECK W WO CM (CODE STROKE)  Result Date: 04/13/2023 CLINICAL DATA:  Code stroke EXAM: CT ANGIOGRAPHY HEAD AND NECK WITH AND WITHOUT CONTRAST TECHNIQUE: Multidetector CT imaging of the head and neck was performed using the standard protocol during bolus administration of intravenous contrast. Multiplanar CT image reconstructions and MIPs were obtained to evaluate the vascular anatomy. Carotid stenosis measurements (when applicable) are obtained utilizing NASCET criteria, using the distal internal carotid diameter as the denominator. RADIATION DOSE REDUCTION: This exam was performed according to the departmental dose-optimization program which includes automated exposure control, adjustment of the mA and/or kV according to patient size and/or use of iterative reconstruction technique. CONTRAST:  75mL OMNIPAQUE IOHEXOL 350 MG/ML SOLN COMPARISON:  None Available. FINDINGS: CTA NECK FINDINGS SKELETON: There is no bony spinal canal stenosis. No lytic or blastic lesion. OTHER NECK: Normal pharynx, larynx and major salivary glands. No cervical lymphadenopathy. Unremarkable thyroid gland. UPPER CHEST: Advanced emphysema. AORTIC ARCH: There is calcific atherosclerosis of the aortic arch. Conventional 3 vessel aortic branching pattern. RIGHT CAROTID SYSTEM: Normal without aneurysm, dissection or stenosis. LEFT CAROTID SYSTEM: Normal without aneurysm, dissection or stenosis. VERTEBRAL ARTERIES: Left dominant configuration.There is no dissection, occlusion or flow-limiting stenosis to the skull base (V1-V3 segments). CTA HEAD FINDINGS POSTERIOR CIRCULATION: --Vertebral arteries: Normal V4 segments. --Inferior cerebellar arteries: Normal. --Basilar artery: Normal. --Superior cerebellar arteries: Normal. --Posterior cerebral arteries (PCA): Normal. ANTERIOR CIRCULATION: --Intracranial internal carotid arteries: Normal. --Anterior cerebral arteries (ACA): Normal.  Both A1 segments are present. Patent anterior communicating artery (a-comm). --Middle cerebral arteries (MCA): Normal. VENOUS SINUSES: As permitted by contrast timing, patent. ANATOMIC VARIANTS: Fetal origins of both posterior cerebral arteries. Review of the MIP images confirms the above findings. IMPRESSION: 1. No emergent large vessel occlusion or high-grade stenosis of the intracranial arteries. 2. Unchanged appearance of intracranial hemorrhage with subfalcine and right uncal herniation. Aortic Atherosclerosis (ICD10-I70.0) and Emphysema (ICD10-J43.9). Electronically Signed   By: Deatra Robinson M.D.   On: 04/13/2023 00:29   CT C-SPINE NO CHARGE  Result Date: 04/18/2023 CLINICAL DATA:  Neck trauma (Age >= 65y) EXAM: CT CERVICAL SPINE WITHOUT CONTRAST TECHNIQUE: Multidetector CT imaging of the cervical spine was performed without intravenous contrast. Multiplanar CT image reconstructions were also generated. RADIATION DOSE REDUCTION: This exam was performed according to the departmental dose-optimization program which includes automated exposure control, adjustment of the mA and/or kV according to patient size and/or use of iterative reconstruction technique. COMPARISON:  None Available. FINDINGS: Alignment: Normal. Skull base and vertebrae: Multilevel mild moderate degenerative changes of the spine. No associated severe osseous neural foraminal or central canal stenosis. No acute fracture. No aggressive appearing focal osseous lesion or focal pathologic process. Soft tissues and spinal canal: No prevertebral fluid or swelling. No visible canal hematoma. Upper chest: Severe centrilobular and paraseptal emphysematous changes. Likely partially visualized bullous changes of the left lung. Other: None. IMPRESSION: 1. No acute displaced fracture or traumatic listhesis of the cervical spine. 2.  Emphysema (ICD10-J43.9)-severe. Electronically Signed   By: Tish Frederickson M.D.   On: 04/30/2023 00:21   CT HEAD CODE  STROKE WO CONTRAST  Result Date: 04/26/2023 CLINICAL DATA:  Code stroke. EXAM: CT HEAD  WITHOUT CONTRAST TECHNIQUE: Contiguous axial images were obtained from the base of the skull through the vertex without intravenous contrast. RADIATION DOSE REDUCTION: This exam was performed according to the departmental dose-optimization program which includes automated exposure control, adjustment of the mA and/or kV according to patient size and/or use of iterative reconstruction technique. COMPARISON:  None Available. FINDINGS: Brain: There is a large acute right convexity subdural hematoma measuring up to 10 mm in thickness. There is also intraventricular blood within the right lateral and third ventricles. Subdural blood extends along the right tentorial leaflet. There is approximately 18 mm of leftward midline shift. There is subfalcine herniation and right uncal herniation. Vascular: No abnormal hyperdensity of the major intracranial arteries or dural venous sinuses. No intracranial atherosclerosis. Skull: The visualized skull base, calvarium and extracranial soft tissues are normal. Sinuses/Orbits: No fluid levels or advanced mucosal thickening of the visualized paranasal sinuses. No mastoid or middle ear effusion. The orbits are normal. IMPRESSION: 1. Large acute right convexity subdural hematoma measuring up to 10 mm in thickness with approximately 18 mm of leftward midline shift, subfalcine herniation and right uncal herniation. 2. Intraventricular blood within the right lateral and third ventricles. Critical Value/emergent results were called by telephone at the time of interpretation on 05/06/2023 at 12:16 am to provider [facility] , who verbally acknowledged these results. Electronically Signed   By: Deatra Robinson M.D.   On: 04/17/2023 00:17    Procedures Procedure Name: Intubation Date/Time: 05/03/2023 12:34 AM  Performed by: Glynn Octave, MDPre-anesthesia Checklist: Patient identified, Patient  being monitored, Emergency Drugs available, Timeout performed and Suction available Oxygen Delivery Method: Non-rebreather mask Preoxygenation: Pre-oxygenation with 100% oxygen Induction Type: Rapid sequence Ventilation: Mask ventilation without difficulty Laryngoscope Size: Glidescope, 3 and Mac Tube size: 7.5 mm Airway Equipment and Method: Video-laryngoscopy, Patient positioned with wedge pillow and Stylet Placement Confirmation: ETT inserted through vocal cords under direct vision, CO2 detector and Breath sounds checked- equal and bilateral Secured at: 24 cm Tube secured with: ETT holder Dental Injury: Teeth and Oropharynx as per pre-operative assessment  Difficulty Due To: Difficulty was unanticipated Future Recommendations: Recommend- induction with short-acting agent, and alternative techniques readily available    .Critical Care  Performed by: Glynn Octave, MD Authorized by: Glynn Octave, MD   Critical care provider statement:    Critical care time (minutes):  60   Critical care time was exclusive of:  Separately billable procedures and treating other patients   Critical care was necessary to treat or prevent imminent or life-threatening deterioration of the following conditions:  Respiratory failure and CNS failure or compromise   Critical care was time spent personally by me on the following activities:  Development of treatment plan with patient or surrogate, discussions with consultants, evaluation of patient's response to treatment, examination of patient, ordering and review of laboratory studies, ordering and review of radiographic studies, ordering and performing treatments and interventions, pulse oximetry, re-evaluation of patient's condition, review of old charts, blood draw for specimens and obtaining history from patient or surrogate   I assumed direction of critical care for this patient from another provider in my specialty: no     Care discussed with:  admitting provider       Medications Ordered in ED Medications  naloxone (NARCAN) 0.4 MG/ML injection (has no administration in time range)    ED Course/ Medical Decision Making/ A&P  Medical Decision Making Amount and/or Complexity of Data Reviewed Independent Historian: guardian and EMS Labs: ordered. Decision-making details documented in ED Course. Radiology: ordered and independent interpretation performed. Decision-making details documented in ED Course. ECG/medicine tests: ordered and independent interpretation performed. Decision-making details documented in ED Course.  Risk Prescription drug management. Decision regarding hospitalization.   Patient from home after falling.  She is unresponsive.  Does not speak or follow commands.  She is hypertensive.  Code stroke activated on arrival.  Last seen normal 10 PM.  Discussed with daughter [place].  She states patient does have an advanced directive but she does not know the details  CT head confirms large right subdural hematoma with midline shift.  Discussed with Dr. Chase Picket of radiology.  Results reviewed and interpreted by me.  Patient is not on any blood thinners other than aspirin. CTA negative for aneurysm or large vessel stenosis.  She is not protecting her airway.  Discussed with patient's husband Dorene Sorrow by phone as well as daughter Babette Relic and daughter Lanice Schwab.  Patient does have an advanced directive but they are not sure of the details.  Family decided they would want her placed on life support temporarily while options are sorted out.  They are agreeable to intubation.  Patient intubated.  Patient started on IV Cleviprex for blood pressure control.  Discussed with neurosurgery.  Discussed with neurosurgery PA Cosentino who reviewed CT images.  Does not recommend any kind of neurosurgical intervention given patient's extensive bleed with herniation, comorbidities and age.  Recommends medical  management with blood pressure control.  Does not see a role for hypertonic saline or mannitol. Will give seizure prophylaxis.  Cleviprex initiated for blood pressure control.  Keppra given.  No role for mannitol or hypertonic saline per neurosurgery. Potassium 2.4.  Will aggressively replace.  Admission to ICU discussed with Dr. Marchelle Gearing.  Patient to be transferred to [facility], ICU.  Discussed with Tammy daughter at bedside.       Final Clinical Impression(s) / ED Diagnoses Final diagnoses:  SDH (subdural hematoma) (HCC)  Acute respiratory failure with hypoxia [facility])    Rx / DC Orders ED Discharge Orders     None         Jeorge Reister, Jeannett Senior, MD 04/12/2023 0201

## 2023-05-12 NOTE — Telephone Encounter (Signed)
Contacted pt to schedule an appt. Unable to reach via phone, voicemail was left.   Scheduling Message Entered by Lynda Rainwater on 05/06/2023 at 12:07 PM Priority: Routine NUT 45  Department: [facility]  Provider:  Appointment Notes:  Weight loss  Scheduling Notes:  Nut per feng

## 2023-05-12 NOTE — H&P (Signed)
NAME:  Monica [name], MRN:  [id number], DOB:  [birth year], LOS: 0 ADMISSION DATE:  04/24/2023 CONSULTATION DATE:  04/29/2023 REFERRING MD:  Rancour - EDP CHIEF COMPLAINT:  SDH   History of Present Illness:  77 year old woman who presented to [facility] ED 7/30 as Code Stroke. 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 Of note, patient was recently seen at [facility]; brain imaging completed 05/04/2023 with CT Head and MRI Brain NAICA.  EMS called 7/29PM after fall from standing with head injury, no LOC. LKW 2200 with dizziness/weakness post-fall and deteriorating mental status en route to hospital. On arrival to [facility] ED, patient was not speaking and not following commands but was reportedly breathing spontaneously. Patient was mildly hypothermic to 97.43F, HR 71, hypertensive with BP 200/87, RR 18, SpO2 92%. CT Head demonstrated large acute R convexity SDH up to 10mm thickness with 18mm R to L shift, subfalcine herniation and uncal herniation + intraventricular blood in R lateral and third ventricles. CTA Head/Neck negative for aneurysm/large vessel stenosis. Patient was intubated for airway protection.  PCCM consulted for ICU admission and management.  On discussion with patient's daughter, patient had received brain XRT x 6 weeks for metastasis from lung CA. Patient's short-term memory was noticeably worse with "brain fog", as expected post-radiation. Additionally, her appetite has been less and she has required significant encouragement to eat. She has remained fairly independent. Last week while on vacation in [place], patient had an episode of confusion and dizziness and "slumping over". She was taken to [facility] and brain imaging was completed and unremarkable. Glucoses were high (max ~380) and confusion/mental  status changes were ultimately attributed to this.  Pertinent Medical History:   Past Medical History:  Diagnosis Date   Breast cancer (HCC) 2018   left breast, radiation therapy   CAD (coronary artery disease)    Cypher stent 09/2002   CHF (congestive heart failure) (HCC)    Depression    Diabetes mellitus (HCC)    Hyperlipidemia    Hypertension    Metastatic lung cancer (metastasis from lung to other site) (HCC)    Mets to brain, s/p chemo/XRT (lung and brain)   Myocardial infarction (HCC)    2003,went into cardiac shock   Osteoporosis    Seizures (HCC)    1st seizure age late 61's; most recent 07/28/17   Sinusitis    Significant Hospital Events: Including procedures, antibiotic start and stop dates in addition to other pertinent events   7/30 - Presented to [facility] ED as Code Stroke. Deteriorating mental status en route with EMS. CT Head with large acute R convexity SDH up to 10mm thickness with 18mm R to L shift, subfalcine herniation and uncal herniation + intraventricular blood in R lateral and third ventricles. CTA without aneurysm. Intubated for airway protection. Worsening neuro exam, made DNR after discussion with patient's daughter.  Interim History / Subjective:  PCCM consulted for ICU admission.  Objective:  Blood pressure (!) 140/73, pulse (!) 102, temperature (!) 97.4 F (36.3 C), temperature source Axillary, resp. rate 16, weight 46.5 kg, SpO2 100%.    Vent Mode: PRVC FiO2 (%):  [50 %-100 %] 50 % Set Rate:  [18 bmp] 18 bmp Vt Set:  [500 mL] 500 mL PEEP:  [5 cmH20] 5 cmH20 Plateau Pressure:  [  10 cmH20] 10 cmH20  No intake or output data in the 24 hours ending 05/08/2023 0340 Filed Weights   04/11/2023 2330  Weight: 46.5 kg   Physical Examination: General: Chronically ill-appearing elderly woman, frail/cachectic, in NAD. HEENT: Normocephalic, anicteric sclera, pupils fixed and dilated bilaterally 6mm. Moist mucous membranes, scant blood to upper lip/in pooled  secretions. Neuro: Intubaed. Comatose. Does not respond to verbal, tactile or noxious stimuli. Does not withdraw to pain. Not following commands. No spontaneous movement of extremities. Unable to assess strength.No cough, gag, or corneals present. CV: Tachycardic to 100s, regular rhythm, no m/g/r. PULM: Breathing even and unlabored on vent (PEEP 5, FiO2 50%). Lung fields CTAB. GI: Soft, nontender, nondistended. Normoactive bowel sounds. Extremities: No LE edema noted. Skin: Warm/dry, no rashes.  Resolved Hospital Problem List:    Assessment & Plan:  Large R convexity traumatic subdural hematoma Early subfalcine and uncal herniation ?Seizure history Presented to [facility] ED as Code Stroke after fall from standing. CT Head with large acute R convexity SDH up to 10mm thickness with 18mm R to L shift, subfalcine herniation and uncal herniation + intraventricular blood in R lateral and third ventricles. Rapid clinical deterioration. - Admit to 4N ICU for definitive NSGY evaluation - Patient is unfortunately very unlikely to be a candidate for surgical intervention, given clinical status prior to fall/acute insult and deteriorating neurologic exam - F/u formal NSGY recommendations, will likely proceed with medical management alone - BP control with Cleviprex, goal SBP < 160 - Continue Keppra for seizure ppx (home medication) - No role for mannitol/HTS at present - Frequent neurochecks - Ongoing GOC discussions (see below)  Acute hypoxic respiratory failure in the setting of above ?COPD - Continue full vent support (4-8cc/kg IBW) - Wean FiO2 for O2 sat > 90% - Daily WUA/SBT as appropriate, patient is not a candidate for SBT at this time - VAP bundle - Bronchodilators (Brovana/Yupelri/Pulmicort, on home Trelegy) - Pulmonary hygiene - PAD protocol for sedation: Propofol and Fentanyl for goal RASS 0 to -1 - Follow CXR  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 Mosetta Putt). Seen earlier on date of admission in Onc Clinic. S/p XRT to breast, XRT/chemo for lung CA and XRT to brain. - Hold home chemotherapeutic agents - Hold Megace  CAD with history of MI - stent placed 2003 HTN HLD - Remains on Cleviprex (as above) - Hold home antihypertensives for now, resume as appropriate - Cardiac monitoring - Optimize electrolytes for K > 4, Mg > 2 - Hold home ASA/statin for now  T2DM with hyperglycemia Per patient's daughter, difficult to control glucoses; recently as high as 380 with mental status changes. - SSI - CBGs Q4H - Goal CBG 140-180 - Hold home diabetes medications  GOC - DNR code status per patient's daughter, at bedside - Please see IPAL note of same date for detailed discussion - If not a surgical candidate (suspected), family will likely opt for transition to comfort-focused care after family has had an opportunity to visit  Best Practice: (right click and "Reselect all SmartList Selections" daily)   Diet/type: NPO DVT prophylaxis: SCDs, AC contraindicated in the setting of SDH GI prophylaxis: PPI Lines: N/A Foley:  N/A Code Status:  full code Last date of multidisciplinary goals of care discussion [Pending, of note has Advanced Directive in chart dated 04/19/2017]  Labs:  CBC: Recent Labs  Lab 05/05/2023 1105 04/25/2023 2340 04/23/2023 0006  WBC 5.2 6.7  --  NEUTROABS 4.2 5.3  --   HGB 12.6 13.3 14.3  HCT 38.4 42.2 42.0  MCV 72.0* 72.9*  --   PLT 110* 142*  --    Basic Metabolic Panel: Recent Labs  Lab 05/08/2023 1105 04/23/2023 2340 04/19/2023 0006  NA 138 135 137  K 3.0* 2.2* 2.4*  CL 100 96* 96*  CO2 29 26  --   GLUCOSE 153* 175* 171*  BUN 28* 25* 25*  CREATININE 0.34* 0.39* 0.30*  CALCIUM 9.9 9.4  --    GFR: Estimated Creatinine Clearance: 43.9 mL/min (A) (by C-G formula based on SCr of 0.3 mg/dL (L)). Recent Labs  Lab 04/17/2023 1105 05/07/2023 2340 04/12/2023 0008  WBC 5.2 6.7  --   LATICACIDVEN  --   --  1.3    Liver Function Tests: Recent Labs  Lab 04/13/2023 1105 04/30/2023 2340  AST 18 29  ALT 10 18  ALKPHOS 54 58  BILITOT 0.7 0.9  PROT 6.6 8.0  ALBUMIN 4.3 4.6   No results for input(s): "LIPASE", "AMYLASE" in the last 168 hours. No results for input(s): "AMMONIA" in the last 168 hours.  ABG:    Component Value Date/Time   TCO2 26 04/23/2023 0006    Coagulation Profile: Recent Labs  Lab 04/11/2023 2340  INR 1.1   Cardiac Enzymes: No results for input(s): "CKTOTAL", "CKMB", "CKMBINDEX", "TROPONINI" in the last 168 hours.  HbA1C: Hgb A1c MFr Bld  Date/Time Value Ref Range Status  03/09/2017 12:47 PM 8.5 (H) 4.8 - 5.6 % Final    Comment:    (NOTE)         Pre-diabetes: 5.7 - 6.4         Diabetes: >6.4         Glycemic control for adults with diabetes: <7.0    CBG: Recent Labs  Lab 04/12/2023 2353  GLUCAP 195*   Review of Systems:   Patient is encephalopathic and/or intubated; therefore, history has been obtained from chart review.   Past Medical History:  She,  has a past medical history of Breast cancer (HCC) (2018), CAD (coronary artery disease), CHF (congestive heart failure) (HCC), Depression, Diabetes mellitus (HCC), Hyperlipidemia, Hypertension, Metastatic lung cancer (metastasis from lung to other site) [facility]), Myocardial infarction (HCC), Osteoporosis, Seizures (HCC), and Sinusitis.   Surgical History:   Past Surgical History:  Procedure Laterality Date   ABDOMINAL HYSTERECTOMY     APPENDECTOMY     BREAST LUMPECTOMY WITH RADIOACTIVE SEED AND SENTINEL LYMPH NODE BIOPSY Left 03/16/2017   Procedure: INJECT BLUE DYE LEFT BREAST, LEFT BREAST LUMPECTOMY WITH RADIOACTIVE SEED AND LEFT AXILLARY DEEP SENTINEL LYMPH NODE  BIOPSY ADJACENT TISSUE TRANSFER;  Surgeon: Claud Kelp, MD;  Location: [facility] OR;  Service: General;  Laterality: Left;   CARPAL TUNNEL RELEASE Bilateral    COLONOSCOPY WITH PROPOFOL N/A 05/30/2015   Procedure: COLONOSCOPY WITH PROPOFOL;  Surgeon:  Jeani Hawking, MD;  Location: [facility] ENDOSCOPY;  Service: Endoscopy;  Laterality: N/A;   COLONOSCOPY WITH PROPOFOL N/A 06/09/2018   Procedure: COLONOSCOPY WITH PROPOFOL;  Surgeon: Jeani Hawking, MD;  Location: [facility] ENDOSCOPY;  Service: Endoscopy;  Laterality: N/A;   COLONOSCOPY WITH PROPOFOL N/A 07/31/2021   Procedure: COLONOSCOPY WITH PROPOFOL;  Surgeon: Jeani Hawking, MD;  Location: [facility] ENDOSCOPY;  Service: Endoscopy;  Laterality: N/A;   CORONARY ANGIOPLASTY WITH STENT PLACEMENT  10-02-2002   ESOPHAGOGASTRODUODENOSCOPY (EGD) WITH PROPOFOL N/A 07/31/2021   Procedure: ESOPHAGOGASTRODUODENOSCOPY (EGD) WITH PROPOFOL;  Surgeon: Jeani Hawking, MD;  Location: [facility] ENDOSCOPY;  Service: Endoscopy;  Laterality: N/A;   Exploratory abdominal     Bleeding in abdomen after tubal   IR IMAGING GUIDED PORT INSERTION  09/28/2022   POLYPECTOMY  06/09/2018   Procedure: POLYPECTOMY;  Surgeon: Jeani Hawking, MD;  Location: [facility] ENDOSCOPY;  Service: Endoscopy;;   POLYPECTOMY  07/31/2021   Procedure: POLYPECTOMY;  Surgeon: Jeani Hawking, MD;  Location: [facility] ENDOSCOPY;  Service: Endoscopy;;   TUBAL LIGATION     UMBILICAL HERNIA REPAIR     Social History:   reports that she quit smoking about 2 years ago. Her smoking use included cigarettes. She started smoking about 32 years ago. She has a 60 pack-year smoking history. She has never used smokeless tobacco. She reports that she does not drink alcohol and does not use drugs.   Family History:  Her family history includes Dementia in her mother; Heart attack (age of onset: 106) in her father; Heart attack (age of onset: 75) in her brother and brother; Kidney disease in her mother.   Allergies: Allergies  Allergen Reactions   Ace Inhibitors Dermatitis and Cough   Keflex [Cephalexin] Itching   Phenobarbital Other (See Comments)    Black spots on hands and feet.   Sulfate Nausea And Vomiting and Other (See Comments)    Dizziness    Altace [Ramipril] Other (See Comments)     UNSPECIFIED REACTION     Codeine Other (See Comments)   Dicyclomine Hcl Itching   Home Medications: Prior to Admission medications   Medication Sig Start Date End Date Taking? Authorizing Provider  acetaminophen (TYLENOL) 500 MG tablet Take 1,000 mg by mouth every 6 (six) hours as needed for moderate pain or headache.    [provider]  albuterol (VENTOLIN HFA) 108 (90 Base) MCG/ACT inhaler INHALE 2 PUFFS BY MOUTH EVERY 6 HOURS AS NEEDED FOR WHEEZING OR  SHORTHNESS  OF  BREATH 07/14/21   Jetty Duhamel D, MD  anastrozole (ARIMIDEX) 1 MG tablet Take 1 tablet by mouth once daily 04/18/23   Malachy Mood, MD  aspirin EC 81 MG tablet Take 81 mg by mouth at bedtime.    [provider]  atorvastatin (LIPITOR) 40 MG tablet Take 40 mg by mouth at bedtime. 12/07/16   [provider]  B-D ULTRAFINE III SHORT PEN 31G X 8 MM MISC Inject into the skin as directed. 03/16/20   [provider]  Calcium Carbonate-Vitamin D (CALTRATE 600+D PO) Take 1 tablet by mouth in the morning and at bedtime.    [provider]  carvedilol (COREG) 3.125 MG tablet Take 3.125 mg by mouth 2 (two) times daily.     [provider]  cetirizine (ZYRTEC) 10 MG tablet Take 10 mg by mouth daily as needed for allergies.    [provider]  clonazePAM (KLONOPIN) 1 MG tablet Take 1 mg by mouth in the morning and at bedtime. 03/02/16   [provider]  Insulin Detemir (LEVEMIR) 100 UNIT/ML Pen Inject 17-25 Units into the skin See admin instructions. Inject 25 units subcutaneously in the morning and inject 17 units subcutaneously at night    [provider]  insulin lispro (HUMALOG) 100 UNIT/ML KwikPen Inject 7-9 Units into the skin See admin instructions. Inject 7 units in the morning, 7 units at lunch, and 9 units at night 06/18/21   [provider]  ketorolac (ACULAR) 0.5 % ophthalmic solution Place 1 drop into the left eye 4 (four) times daily. 06/03/22    [provider]  levETIRAcetam (KEPPRA) 500  MG tablet Take 1 tablet (500 mg total) by mouth 2 (two) times daily. 02/23/23   Lomax, Amy, NP  lidocaine-prilocaine (EMLA) cream Apply to affected area once 09/24/22   Malachy Mood, MD  losartan (COZAAR) 50 MG tablet Take 25 mg by mouth at bedtime.    [provider]  MAGNESIUM-OXIDE 400 (240 Mg) MG tablet Take 1 tablet by mouth once daily 04/18/23   Pollyann Samples, NP  megestrol (MEGACE ES) 625 MG/5ML suspension Take 5 mLs (625 mg total) by mouth daily. 04/11/2023   Malachy Mood, MD  moxifloxacin (VIGAMOX) 0.5 % ophthalmic solution Place 1 drop into the left eye 4 (four) times daily. 06/03/22   [provider]  nicotine (NICODERM CQ - DOSED IN MG/24 HR) 7 mg/24hr patch Place 1 patch (7 mg total) onto the skin daily. 03/17/23   Malachy Mood, MD  nitroGLYCERIN (NITROSTAT) 0.4 MG SL tablet Place 1 tablet (0.4 mg total) under the tongue every 5 (five) minutes as needed for chest pain. 04/10/21 09/13/22  Rollene Rotunda, MD  Omega-3 Fatty Acids (FISH OIL) 1000 MG CAPS Take 1,000 mg by mouth in the morning and at bedtime.    [provider]  omeprazole (PRILOSEC) 20 MG capsule Take 20 mg by mouth in the morning.    [provider]  ONETOUCH VERIO test strip USE THREE TIMES A DAY AND AS NEEDED FOR HYPOGLYCEMIA 03/12/20   [provider]  OXYGEN Inhale 2 L into the lungs at bedtime.    [provider]  pioglitazone (ACTOS) 15 MG tablet Take 15 mg by mouth at bedtime. 06/18/21   [provider]  Polyethyl Glycol-Propyl Glycol 0.4-0.3 % SOLN Place 1 drop into both eyes 2 (two) times daily as needed (itchy/irritated eyes.).    [provider]  potassium chloride SA (KLOR-CON M) 20 MEQ tablet Take 1 tablet (20 mEq total) by mouth daily. 05/08/2023   Malachy Mood, MD  prednisoLONE acetate (PRED FORTE) 1 % ophthalmic suspension Place 1 drop into the left eye 4 (four) times daily. 06/03/22   [provider]   risedronate (ACTONEL) 150 MG tablet Take 150 mg by mouth every 30 (thirty) days. 03/02/16   [provider]  sertraline (ZOLOFT) 100 MG tablet Take 100 mg by mouth at bedtime. 02/02/17   [provider]  TRELEGY ELLIPTA 100-62.5-25 MCG/ACT AEPB INHALE 1 PUFF ONCE DAILY RINSE MOUTH AFTER USE 04/06/23   Jetty Duhamel D, MD  TRIJARDY XR 12.5-2.02-999 MG TB24 Take 1 tablet by mouth 2 (two) times daily. 02/17/20   [provider]    Critical care time:   The patient is critically ill with multiple organ system failure and requires high complexity decision making for assessment and support, frequent evaluation and titration of therapies, advanced monitoring, review of radiographic studies and interpretation of complex data.   Critical Care Time devoted to patient care services, exclusive of separately billable procedures, described in this note is 43 minutes.  Tim Lair, PA-C  Pulmonary & Critical Care 05/02/2023 3:40 AM  Please see [email or web address] for pager details.  From 7A-7P if no response, please call [phone] After hours, please call ELink [phone]

## 2023-05-12 NOTE — Progress Notes (Signed)
2353 Code Stroke cart activated- LKW unknown, BIB EMS after fall at home. Pt unresponsive. Will not follow command, aphasic, not responsive to sternal rub. BP 200/87 2359 pt to CT 0011 TS paged [phone] Dr Donella Stade on camera 0015 Dr Marina Goodell at bedside, pt back from CT- Large SDH, IVH with midline shift.

## 2023-05-12 DEATH — deceased

## 2023-06-06 ENCOUNTER — Inpatient Hospital Stay: Admission: RE | Admit: 2023-06-06 | Payer: Medicare Other | Source: Ambulatory Visit

## 2023-06-09 ENCOUNTER — Ambulatory Visit: Payer: Self-pay | Admitting: Urology

## 2023-06-12 NOTE — Death Summary Note (Signed)
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 with radiation to brain Prior history of breast cancer Coronary artery disease  Diabetes type 2 with hyperglycemia Hypokalemia Cachexia  Diagnoses  Preliminary cause of death: Withdrawal of care in the setting of Traumatic right-sided subdural hemorrhage with brain compression and herniation syndrome Secondary Diagnoses (including complications and co-morbidities):  Principal Problem:   Subdural hematoma [facility])   Brief Hospital Course (including significant findings, care, treatment, and services provided and events leading to death)  Monica [name] is a 77 y.o. year old female  who presented to [facility] ED Jun 08, 2023 as Code Stroke. 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 Of note, patient was recently seen at [facility]; brain imaging completed 05/04/2023 with CT Head and MRI Brain NAICA.   EMS called 7/29PM after fall from standing with head injury, no LOC. LKW 2200 with dizziness/weakness post-fall and deteriorating mental status en route to hospital. On arrival to [facility] ED, patient was not speaking and not following commands but was reportedly breathing spontaneously. Patient was mildly hypothermic to 97.20F, HR 71,  hypertensive with BP 200/87, RR 18, SpO2 92%. CT Head demonstrated large acute R convexity SDH up to 10mm thickness with 18mm R to L shift, subfalcine herniation and uncal herniation + intraventricular blood in R lateral and third ventricles. CTA Head/Neck negative for aneurysm/large vessel stenosis. Patient was intubated for airway protection.   PCCM consulted for ICU admission and management.   On discussion with patient's daughter, patient had received brain XRT x 6 weeks for metastasis from lung CA. Patient's short-term memory was noticeably worse with "brain fog", as expected post-radiation. Additionally, her appetite has been less and she has required significant encouragement to eat. She has remained fairly independent. Last week while on vacation in [place], patient had an episode of confusion and dizziness and "slumping over". She was taken to [facility] and brain imaging was completed and unremarkable. Glucoses were high (max ~380) and confusion/mental status changes were ultimately attributed to this.  Patient with obvious signs of herniation syndrome, she was started on 3% hypertonic saline, head end of the bed was elevated to 45 degrees, neurosurgery was consulted, recommend no surgical intervention considering patient has already herniated.  Goals of care discussions were carried with family, as unfortunately she herniated, patient's family decided to proceed with comfort care.  Patient was palliatively extubated and she died on 06-08-23 at 1:26 PM.  Patient's family was at bedside  Pertinent Labs and Studies  Significant Diagnostic Studies DG Abdomen 1 View  Result Date: 08-Jun-2023 CLINICAL DATA:  OG tube EXAM: ABDOMEN - 1 VIEW COMPARISON:  03/18/2021 FINDINGS: Esophageal tube tip and side port overlie the proximal stomach. Excreted contrast within the renal collecting systems. Possible mild right hydronephrosis. IMPRESSION: Esophageal tube tip and side port  overlie the proximal stomach. Excreted  contrast in renal collecting systems with possible mild right hydronephrosis. This could be correlated with renal ultrasound. Electronically Signed   By: Jasmine Pang M.D.   On: May 29, 2023 00:56   DG Chest Portable 1 View  Result Date: May 29, 2023 CLINICAL DATA:  Post intubation EXAM: PORTABLE CHEST 1 VIEW COMPARISON:  02/09/2021 FINDINGS: Endotracheal tube tip is about 2 cm superior to the carina. Esophageal tube tip below the diaphragm but incompletely visualized. Right-sided central venous catheter port tip at the SVC. Emphysema. No acute airspace disease. Normal cardiac size. Aortic atherosclerosis IMPRESSION: Endotracheal tube tip about 2 cm superior to the carina.  Emphysema Electronically Signed   By: Jasmine Pang M.D.   On: 29-May-2023 00:55   CT ANGIO HEAD NECK W WO CM (CODE STROKE)  Result Date: 05-29-2023 CLINICAL DATA:  Code stroke EXAM: CT ANGIOGRAPHY HEAD AND NECK WITH AND WITHOUT CONTRAST TECHNIQUE: Multidetector CT imaging of the head and neck was performed using the standard protocol during bolus administration of intravenous contrast. Multiplanar CT image reconstructions and MIPs were obtained to evaluate the vascular anatomy. Carotid stenosis measurements (when applicable) are obtained utilizing NASCET criteria, using the distal internal carotid diameter as the denominator. RADIATION DOSE REDUCTION: This exam was performed according to the departmental dose-optimization program which includes automated exposure control, adjustment of the mA and/or kV according to patient size and/or use of iterative reconstruction technique. CONTRAST:  75mL OMNIPAQUE IOHEXOL 350 MG/ML SOLN COMPARISON:  None Available. FINDINGS: CTA NECK FINDINGS SKELETON: There is no bony spinal canal stenosis. No lytic or blastic lesion. OTHER NECK: Normal pharynx, larynx and major salivary glands. No cervical lymphadenopathy. Unremarkable thyroid gland. UPPER CHEST: Advanced emphysema. AORTIC ARCH: There is calcific  atherosclerosis of the aortic arch. Conventional 3 vessel aortic branching pattern. RIGHT CAROTID SYSTEM: Normal without aneurysm, dissection or stenosis. LEFT CAROTID SYSTEM: Normal without aneurysm, dissection or stenosis. VERTEBRAL ARTERIES: Left dominant configuration.There is no dissection, occlusion or flow-limiting stenosis to the skull base (V1-V3 segments). CTA HEAD FINDINGS POSTERIOR CIRCULATION: --Vertebral arteries: Normal V4 segments. --Inferior cerebellar arteries: Normal. --Basilar artery: Normal. --Superior cerebellar arteries: Normal. --Posterior cerebral arteries (PCA): Normal. ANTERIOR CIRCULATION: --Intracranial internal carotid arteries: Normal. --Anterior cerebral arteries (ACA): Normal. Both A1 segments are present. Patent anterior communicating artery (a-comm). --Middle cerebral arteries (MCA): Normal. VENOUS SINUSES: As permitted by contrast timing, patent. ANATOMIC VARIANTS: Fetal origins of both posterior cerebral arteries. Review of the MIP images confirms the above findings. IMPRESSION: 1. No emergent large vessel occlusion or high-grade stenosis of the intracranial arteries. 2. Unchanged appearance of intracranial hemorrhage with subfalcine and right uncal herniation. Aortic Atherosclerosis (ICD10-I70.0) and Emphysema (ICD10-J43.9). Electronically Signed   By: Deatra Robinson M.D.   On: 05/29/23 00:29   CT C-SPINE NO CHARGE  Result Date: 29-May-2023 CLINICAL DATA:  Neck trauma (Age >= 65y) EXAM: CT CERVICAL SPINE WITHOUT CONTRAST TECHNIQUE: Multidetector CT imaging of the cervical spine was performed without intravenous contrast. Multiplanar CT image reconstructions were also generated. RADIATION DOSE REDUCTION: This exam was performed according to the departmental dose-optimization program which includes automated exposure control, adjustment of the mA and/or kV according to patient size and/or use of iterative reconstruction technique. COMPARISON:  None Available. FINDINGS:  Alignment: Normal. Skull base and vertebrae: Multilevel mild moderate degenerative changes of the spine. No associated severe osseous neural foraminal or central canal stenosis. No acute fracture. No aggressive appearing focal osseous lesion or focal pathologic process. Soft tissues and spinal canal: No prevertebral fluid  or swelling. No visible canal hematoma. Upper chest: Severe centrilobular and paraseptal emphysematous changes. Likely partially visualized bullous changes of the left lung. Other: None. IMPRESSION: 1. No acute displaced fracture or traumatic listhesis of the cervical spine. 2.  Emphysema (ICD10-J43.9)-severe. Electronically Signed   By: Tish Frederickson M.D.   On: 05-28-23 00:21   CT HEAD CODE STROKE WO CONTRAST  Result Date: May 28, 2023 CLINICAL DATA:  Code stroke. EXAM: CT HEAD WITHOUT CONTRAST TECHNIQUE: Contiguous axial images were obtained from the base of the skull through the vertex without intravenous contrast. RADIATION DOSE REDUCTION: This exam was performed according to the departmental dose-optimization program which includes automated exposure control, adjustment of the mA and/or kV according to patient size and/or use of iterative reconstruction technique. COMPARISON:  None Available. FINDINGS: Brain: There is a large acute right convexity subdural hematoma measuring up to 10 mm in thickness. There is also intraventricular blood within the right lateral and third ventricles. Subdural blood extends along the right tentorial leaflet. There is approximately 18 mm of leftward midline shift. There is subfalcine herniation and right uncal herniation. Vascular: No abnormal hyperdensity of the major intracranial arteries or dural venous sinuses. No intracranial atherosclerosis. Skull: The visualized skull base, calvarium and extracranial soft tissues are normal. Sinuses/Orbits: No fluid levels or advanced mucosal thickening of the visualized paranasal sinuses. No mastoid or middle ear  effusion. The orbits are normal. IMPRESSION: 1. Large acute right convexity subdural hematoma measuring up to 10 mm in thickness with approximately 18 mm of leftward midline shift, subfalcine herniation and right uncal herniation. 2. Intraventricular blood within the right lateral and third ventricles. Critical Value/emergent results were called by telephone at the time of interpretation on 28-May-2023 at 12:16 am to provider [facility] , who verbally acknowledged these results. Electronically Signed   By: Deatra Robinson M.D.   On: 28-May-2023 00:17    Microbiology Recent Results (from the past 240 hour(s))  MRSA Next Gen by PCR, Nasal     Status: None   Collection Time: 05/28/2023  5:35 AM   Specimen: Nasal Mucosa; Nasal Swab  Result Value Ref Range Status   MRSA by PCR Next Gen NOT DETECTED NOT DETECTED Final    Comment: (NOTE) The GeneXpert MRSA Assay (FDA approved for NASAL specimens only), is one component of a comprehensive MRSA colonization surveillance program. It is not intended to diagnose MRSA infection nor to guide or monitor treatment for MRSA infections. Test performance is not FDA approved in patients less than 39 years old. Performed at [facility], [address] [place], [place] [address]     Lab Basic Metabolic Panel: Recent Labs  Lab 04/16/2023 1105 04/29/2023 2340 2023-05-28 0006 05/28/2023 0330 May 28, 2023 0609  NA 138 135 137 133* 137  K 3.0* 2.2* 2.4* 2.7*  --   CL 100 96* 96* 95*  --   CO2 29 26  --  22  --   GLUCOSE 153* 175* 171* 229*  --   BUN 28* 25* 25* 20  --   CREATININE 0.34* 0.39* 0.30* 0.34*  --   CALCIUM 9.9 9.4  --  9.1  --   MG  --   --   --  1.7  --   PHOS  --   --   --  4.3  --    Liver Function Tests: Recent Labs  Lab 04/19/2023 1105 04/22/2023 2340  AST 18 29  ALT 10 18  ALKPHOS 54 58  BILITOT 0.7  0.9  PROT 6.6 8.0  ALBUMIN 4.3 4.6   No results for input(s): "LIPASE", "AMYLASE" in the last 168 hours. Recent Labs  Lab  04/19/2023 0330  AMMONIA 18   CBC: Recent Labs  Lab June 02, 2023 1105 2023-06-02 2340 04/29/2023 0006 04/25/2023 0330  WBC 5.2 6.7  --  8.4  NEUTROABS 4.2 5.3  --   --   HGB 12.6 13.3 14.3 13.1  HCT 38.4 42.2 42.0 41.0  MCV 72.0* 72.9*  --  73.2*  PLT 110* 142*  --  134*   Cardiac Enzymes: No results for input(s): "CKTOTAL", "CKMB", "CKMBINDEX", "TROPONINI" in the last 168 hours. Sepsis Labs: Recent Labs  Lab 2023/06/02 1105 06/02/23 2340 04/20/2023 0008 05/03/2023 0330 05/11/2023 0609  WBC 5.2 6.7  --  8.4  --   LATICACIDVEN  --   --  1.3 1.3 1.8    Procedures/Operations     Nader Boys 05/13/2023, 6:09 PM

## 2023-06-15 ENCOUNTER — Ambulatory Visit: Payer: Medicare Other | Admitting: Cardiology

## 2023-06-21 ENCOUNTER — Other Ambulatory Visit: Payer: Medicare Other

## 2023-06-21 ENCOUNTER — Ambulatory Visit (HOSPITAL_COMMUNITY): Payer: Medicare Other

## 2023-06-27 ENCOUNTER — Ambulatory Visit: Payer: Medicare Other | Admitting: Hematology
